# Patient Record
Sex: Female | Born: 1935
Health system: Southern US, Community
[De-identification: ages and names within clinical notes are randomized; demographics above are authoritative.]

## PROBLEM LIST (undated history)

## (undated) DIAGNOSIS — M25562 Pain in left knee: Secondary | ICD-10-CM

## (undated) DIAGNOSIS — R011 Cardiac murmur, unspecified: Secondary | ICD-10-CM

## (undated) DIAGNOSIS — R0989 Other specified symptoms and signs involving the circulatory and respiratory systems: Secondary | ICD-10-CM

## (undated) DIAGNOSIS — K851 Biliary acute pancreatitis without necrosis or infection: Secondary | ICD-10-CM

## (undated) DIAGNOSIS — D126 Benign neoplasm of colon, unspecified: Secondary | ICD-10-CM

## (undated) DIAGNOSIS — I739 Peripheral vascular disease, unspecified: Secondary | ICD-10-CM

## (undated) DIAGNOSIS — M1990 Unspecified osteoarthritis, unspecified site: Secondary | ICD-10-CM

## (undated) DIAGNOSIS — M6530 Trigger finger, unspecified finger: Secondary | ICD-10-CM

## (undated) DIAGNOSIS — E7800 Pure hypercholesterolemia, unspecified: Secondary | ICD-10-CM

## (undated) DIAGNOSIS — I10 Essential (primary) hypertension: Secondary | ICD-10-CM

## (undated) DIAGNOSIS — R42 Dizziness and giddiness: Secondary | ICD-10-CM

## (undated) DIAGNOSIS — I2510 Atherosclerotic heart disease of native coronary artery without angina pectoris: Secondary | ICD-10-CM

## (undated) DIAGNOSIS — G8929 Other chronic pain: Secondary | ICD-10-CM

## (undated) DIAGNOSIS — B351 Tinea unguium: Secondary | ICD-10-CM

## (undated) DIAGNOSIS — M8580 Other specified disorders of bone density and structure, unspecified site: Secondary | ICD-10-CM

## (undated) DIAGNOSIS — L97429 Non-pressure chronic ulcer of left heel and midfoot with unspecified severity: Secondary | ICD-10-CM

## (undated) DIAGNOSIS — Z87440 Personal history of urinary (tract) infections: Secondary | ICD-10-CM

## (undated) DIAGNOSIS — M549 Dorsalgia, unspecified: Secondary | ICD-10-CM

## (undated) HISTORY — DX: Trigger finger, unspecified finger: M65.30

## (undated) HISTORY — DX: Tinea unguium: B35.1

## (undated) HISTORY — DX: Other specified symptoms and signs involving the circulatory and respiratory systems: R09.89

## (undated) HISTORY — PX: TOTAL SHOULDER ARTHROPLASTY: SHX126

## (undated) HISTORY — DX: Pain in left knee: M25.562

## (undated) HISTORY — DX: Atherosclerotic heart disease of native coronary artery without angina pectoris: I25.10

## (undated) HISTORY — DX: Dorsalgia, unspecified: M54.9

## (undated) HISTORY — PX: EYE SURGERY: SHX253

## (undated) HISTORY — DX: Non-pressure chronic ulcer of left heel and midfoot with unspecified severity: L97.429

## (undated) HISTORY — DX: Other chronic pain: G89.29

## (undated) HISTORY — DX: Unspecified osteoarthritis, unspecified site: M19.90

## (undated) HISTORY — DX: Biliary acute pancreatitis without necrosis or infection: K85.10

## (undated) HISTORY — PX: ABDOMINAL HYSTERECTOMY: SHX81

## (undated) HISTORY — DX: Benign neoplasm of colon, unspecified: D12.6

## (undated) HISTORY — DX: Peripheral vascular disease, unspecified: I73.9

## (undated) HISTORY — PX: TOTAL HIP ARTHROPLASTY: SHX124

## (undated) HISTORY — DX: Other specified disorders of bone density and structure, unspecified site: M85.80

## (undated) HISTORY — PX: TOTAL KNEE ARTHROPLASTY: SHX125

## (undated) HISTORY — DX: Dizziness and giddiness: R42

---

## 1999-10-14 ENCOUNTER — Other Ambulatory Visit: Admission: RE | Admit: 1999-10-14 | Discharge: 1999-10-14 | Payer: Self-pay | Admitting: Obstetrics and Gynecology

## 2000-11-05 ENCOUNTER — Encounter: Admission: RE | Admit: 2000-11-05 | Discharge: 2000-11-05 | Payer: Self-pay | Admitting: Internal Medicine

## 2000-11-05 ENCOUNTER — Encounter: Payer: Self-pay | Admitting: Internal Medicine

## 2003-01-26 ENCOUNTER — Other Ambulatory Visit: Admission: RE | Admit: 2003-01-26 | Discharge: 2003-01-26 | Payer: Self-pay | Admitting: Obstetrics and Gynecology

## 2003-08-27 ENCOUNTER — Ambulatory Visit (HOSPITAL_COMMUNITY): Admission: RE | Admit: 2003-08-27 | Discharge: 2003-08-27 | Payer: Self-pay | Admitting: Cardiology

## 2005-05-18 DIAGNOSIS — D126 Benign neoplasm of colon, unspecified: Secondary | ICD-10-CM

## 2005-05-18 HISTORY — DX: Benign neoplasm of colon, unspecified: D12.6

## 2005-06-03 ENCOUNTER — Encounter (INDEPENDENT_AMBULATORY_CARE_PROVIDER_SITE_OTHER): Payer: Self-pay | Admitting: Specialist

## 2005-06-03 ENCOUNTER — Ambulatory Visit (HOSPITAL_COMMUNITY): Admission: RE | Admit: 2005-06-03 | Discharge: 2005-06-03 | Payer: Self-pay | Admitting: *Deleted

## 2005-07-08 ENCOUNTER — Encounter: Admission: RE | Admit: 2005-07-08 | Discharge: 2005-07-08 | Payer: Self-pay | Admitting: Specialist

## 2005-08-13 ENCOUNTER — Inpatient Hospital Stay (HOSPITAL_COMMUNITY): Admission: RE | Admit: 2005-08-13 | Discharge: 2005-08-18 | Payer: Self-pay | Admitting: Specialist

## 2005-09-07 ENCOUNTER — Encounter: Admission: RE | Admit: 2005-09-07 | Discharge: 2005-10-22 | Payer: Self-pay | Admitting: Specialist

## 2006-02-27 HISTORY — PX: JOINT REPLACEMENT: SHX530

## 2006-06-22 ENCOUNTER — Inpatient Hospital Stay (HOSPITAL_COMMUNITY): Admission: RE | Admit: 2006-06-22 | Discharge: 2006-06-27 | Payer: Self-pay | Admitting: Orthopedic Surgery

## 2006-06-22 HISTORY — PX: JOINT REPLACEMENT: SHX530

## 2006-06-22 IMAGING — CR DG PORTABLE PELVIS
1 series · 1 of 1 positions shown · non-contrast
Comparison: None.

CLINICAL DATA: Status post left hip replacement.
PORTABLE PELVIS ? 1 VIEW:

[view not recorded]
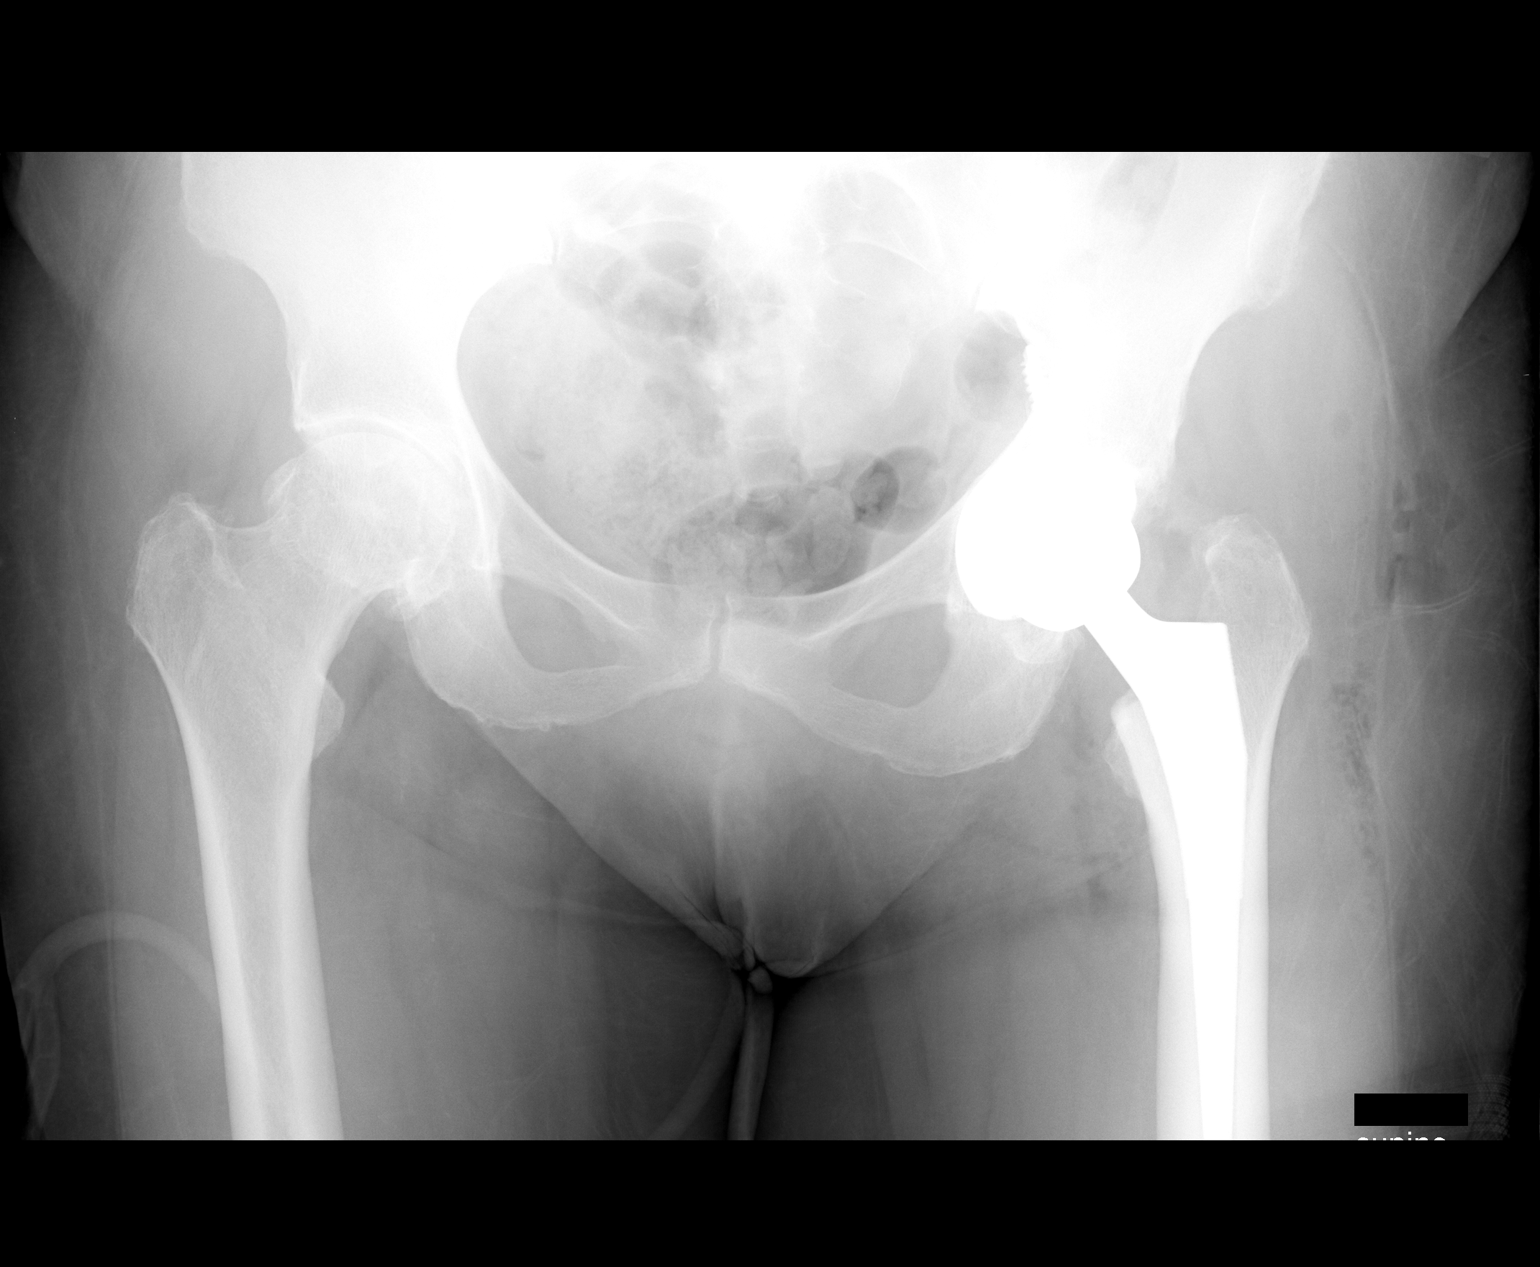

[1 of 1 positions shown; findings below may reference images not displayed]

FINDINGS: Status post left total hip replacement. There is a single screw through the ischium.  The tip of the screw appears to be extraosseous, medial to the pelvic rim. Otherwise satisfactory position and alignment.
IMPRESSION: 1.  The tip of the acetabular screw appears to be medial to the left pelvic rim.
2.  Otherwise good position and alignment.

## 2006-09-06 ENCOUNTER — Encounter: Admission: RE | Admit: 2006-09-06 | Discharge: 2006-09-06 | Payer: Self-pay | Admitting: Specialist

## 2006-09-28 ENCOUNTER — Encounter: Admission: RE | Admit: 2006-09-28 | Discharge: 2006-09-28 | Payer: Self-pay | Admitting: Specialist

## 2006-10-01 ENCOUNTER — Ambulatory Visit (HOSPITAL_COMMUNITY): Admission: RE | Admit: 2006-10-01 | Discharge: 2006-10-01 | Payer: Self-pay | Admitting: Sports Medicine

## 2006-10-01 ENCOUNTER — Ambulatory Visit: Payer: Self-pay | Admitting: *Deleted

## 2006-11-26 ENCOUNTER — Encounter: Admission: RE | Admit: 2006-11-26 | Discharge: 2006-11-26 | Payer: Self-pay | Admitting: Specialist

## 2006-11-26 HISTORY — PX: JOINT REPLACEMENT: SHX530

## 2007-03-21 ENCOUNTER — Encounter: Admission: RE | Admit: 2007-03-21 | Discharge: 2007-03-21 | Payer: Self-pay | Admitting: Orthopedic Surgery

## 2007-03-21 IMAGING — CT CT EXTREM UP W/O CM*R*
1 series · 1 of 2 positions shown · IV contrast (agent unspecified)
Comparison: None.

CLINICAL DATA: 76 year old with right shoulder pain.   Pre op evaluation. 
 CT RIGHT SHOULDER WITHOUT CONTRAST:
TECHNIQUE: Multidetector CT imaging was performed according to the standard protocol.  No intravenous contrast was administered.  Multiplanar CT image reconstructions were also generated.

[Series 1: scouts · coronal · 400.0mm · 0.57mm/px · 1 of 2 slices shown]
[im 2/2]
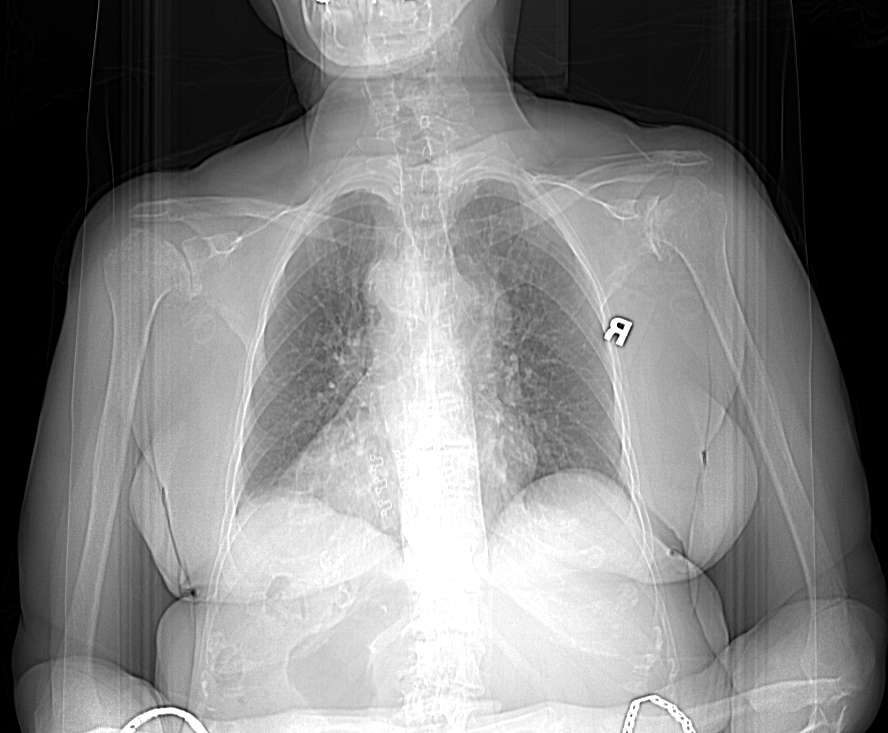

[1 of 2 positions shown; findings below may reference images not displayed]

FINDINGS: On the scout film, there is severe degenerative disease involving both shoulders. The right appears to be slightly worse.  The CT examination demonstrates severe degenerative joint disease with marked joint space narrowing, widening of the glenoid with osteophytic spurring, bony eburnation and subchondral cystic change more significantly involving the humeral head.   The findings may be secondary to advanced osteoarthritis.  Inflammatory arthropathy is also possible particularly gout would be a consideration given what appear to be erosions away from the joint.  There is a rounded ossified density in the axillary recess which could be a focus of synovial osteochondromatosis or a loose body associated with a osteophyte.  The AC joint is intact with moderate degenerative changes.  The acromion is type II in shape.  There is a moderate sized joint effusion.  The rotator cuff tendons appear to be grossly intact.
IMPRESSION: 1.  Severe degenerative disease involving the right shoulder.  It may be advanced osteoarthritis, but inflammatory arthropathy is also possible.  There is a moderate sized joint effusion and a large loose ossified body in the axillary recess.  
 2.  Moderate AC joint degenerative changes.

## 2007-03-21 IMAGING — CT CT EXTREM UP W/O CM*R*
2 of 4 series · 6 of 14 positions shown, 7 images · IV contrast (agent unspecified)
Comparison: None.

CLINICAL DATA: 76 year old with right shoulder pain.   Pre op evaluation. 
 CT RIGHT SHOULDER WITHOUT CONTRAST:
TECHNIQUE: Multidetector CT imaging was performed according to the standard protocol.  No intravenous contrast was administered.  Multiplanar CT image reconstructions were also generated.

[Series 3: shoulder detail · axial · 0.36mm/px · z∈[+4,+54]mm · 3 of 81 slices shown, 4 images]
[im 21/81  soft-tissue]
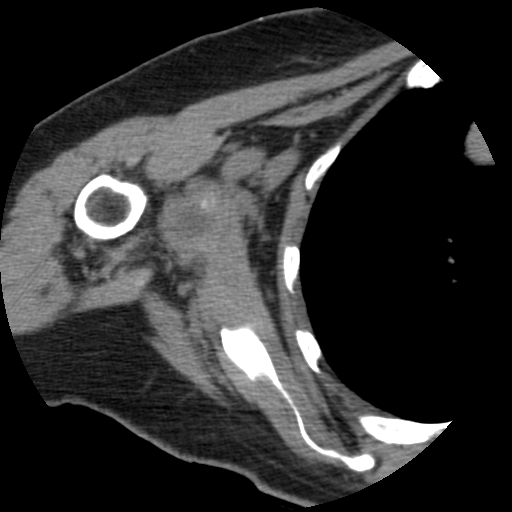
[im 21/81  bone]
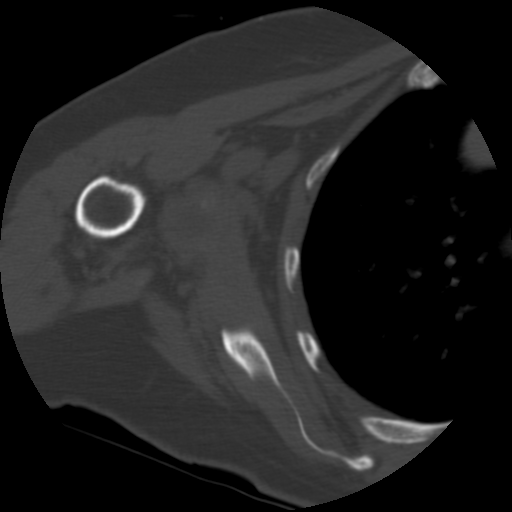
[im 41/81  bone]
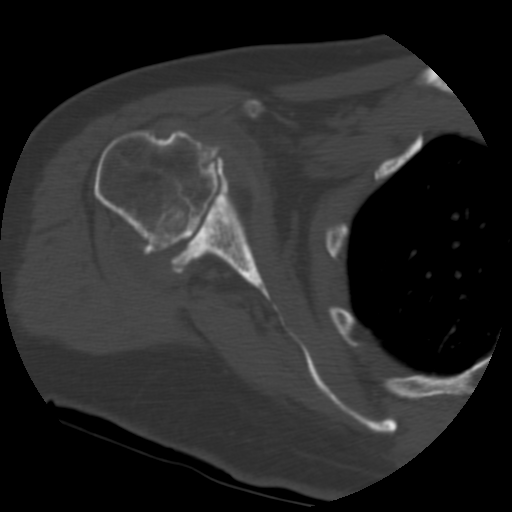
[im 61/81  bone]
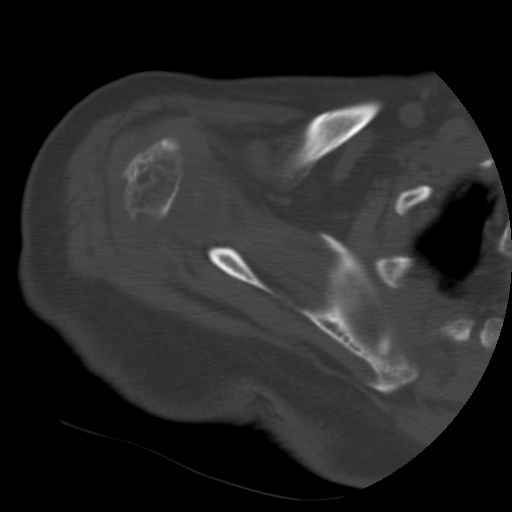

[Series 4: shoulder bone · axial · 0.36mm/px · z∈[+4,+54]mm · 3 of 81 slices shown]
[im 21/81  bone]
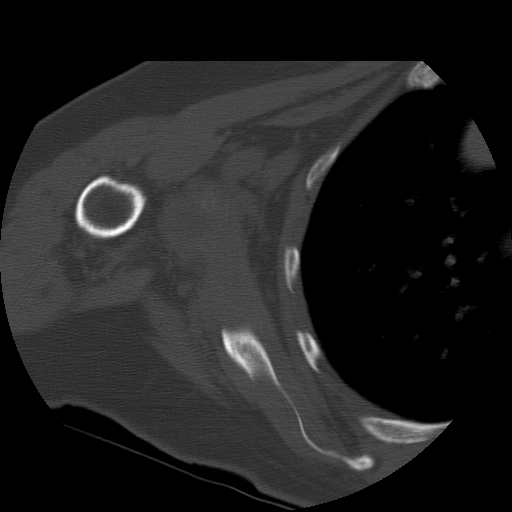
[im 41/81  bone]
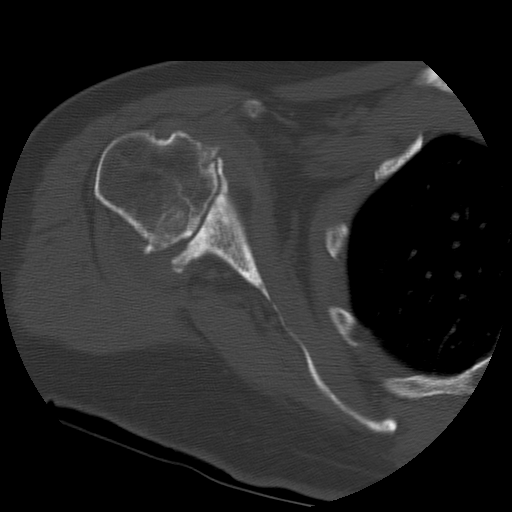
[im 61/81  bone]
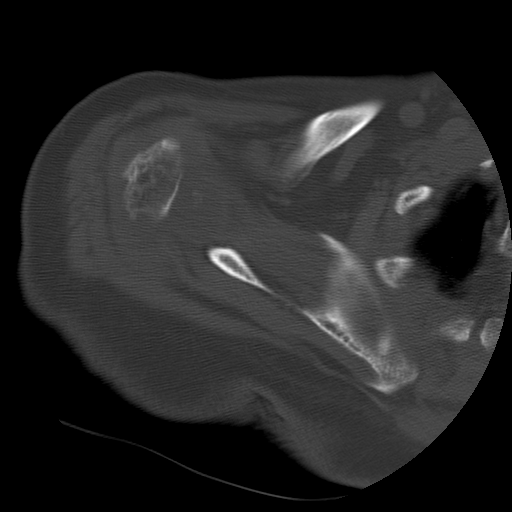

[6 of 14 positions shown; findings below may reference images not displayed]

FINDINGS: On the scout film, there is severe degenerative disease involving both shoulders. The right appears to be slightly worse.  The CT examination demonstrates severe degenerative joint disease with marked joint space narrowing, widening of the glenoid with osteophytic spurring, bony eburnation and subchondral cystic change more significantly involving the humeral head.   The findings may be secondary to advanced osteoarthritis.  Inflammatory arthropathy is also possible particularly gout would be a consideration given what appear to be erosions away from the joint.  There is a rounded ossified density in the axillary recess which could be a focus of synovial osteochondromatosis or a loose body associated with a osteophyte.  The AC joint is intact with moderate degenerative changes.  The acromion is type II in shape.  There is a moderate sized joint effusion.  The rotator cuff tendons appear to be grossly intact.
IMPRESSION: 1.  Severe degenerative disease involving the right shoulder.  It may be advanced osteoarthritis, but inflammatory arthropathy is also possible.  There is a moderate sized joint effusion and a large loose ossified body in the axillary recess.  
 2.  Moderate AC joint degenerative changes.

## 2007-05-31 ENCOUNTER — Encounter: Admission: RE | Admit: 2007-05-31 | Discharge: 2007-05-31 | Payer: Self-pay | Admitting: Orthopedic Surgery

## 2007-06-21 ENCOUNTER — Encounter: Admission: RE | Admit: 2007-06-21 | Discharge: 2007-06-21 | Payer: Self-pay | Admitting: Orthopedic Surgery

## 2007-06-21 IMAGING — CR DG MYELOGRAM LUMBAR
4 series · 4 of 4 positions shown · non-contrast
Comparison: None available.

CLINICAL DATA: Back pain.  
LUMBAR MYELOGRAM:
TECHNIQUE: Multidetector CT imaging of the lumbar spine was performed after intrathecal injection of contrast.  Multiplanar CT image reconstructions were also generated.  Levels from T12-L1 to L5-S1.

[view not recorded (1 of 4)]
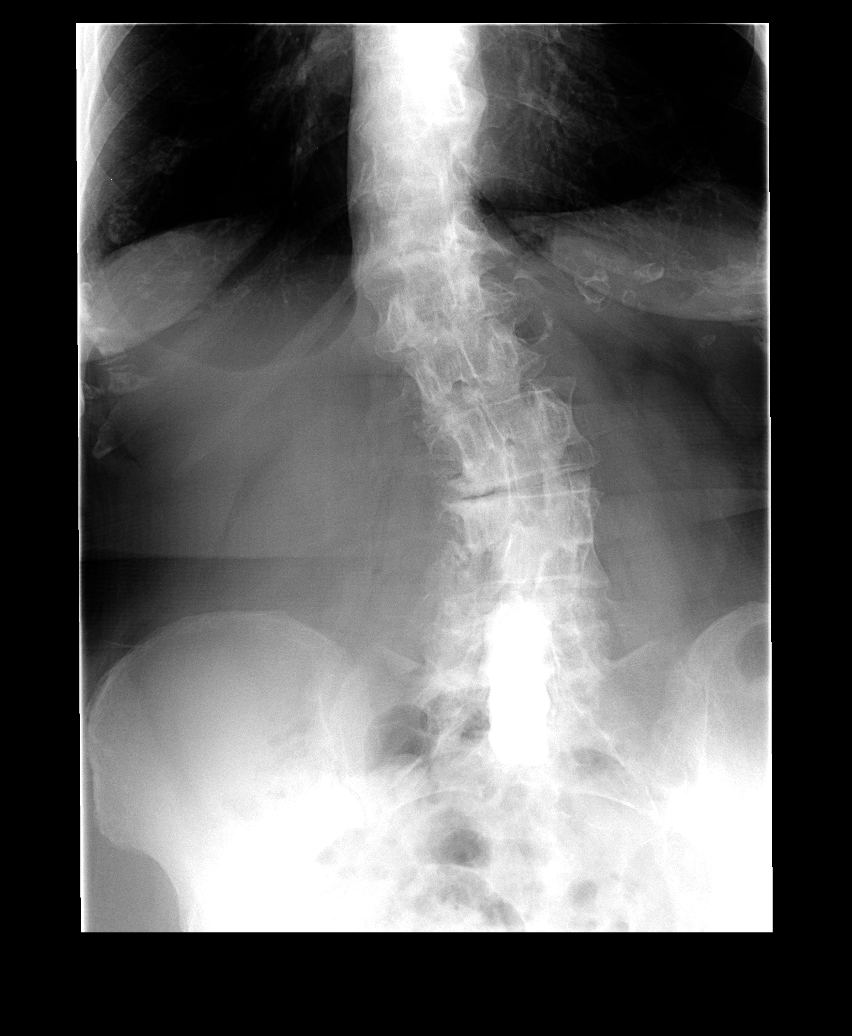

[view not recorded (2 of 4)]
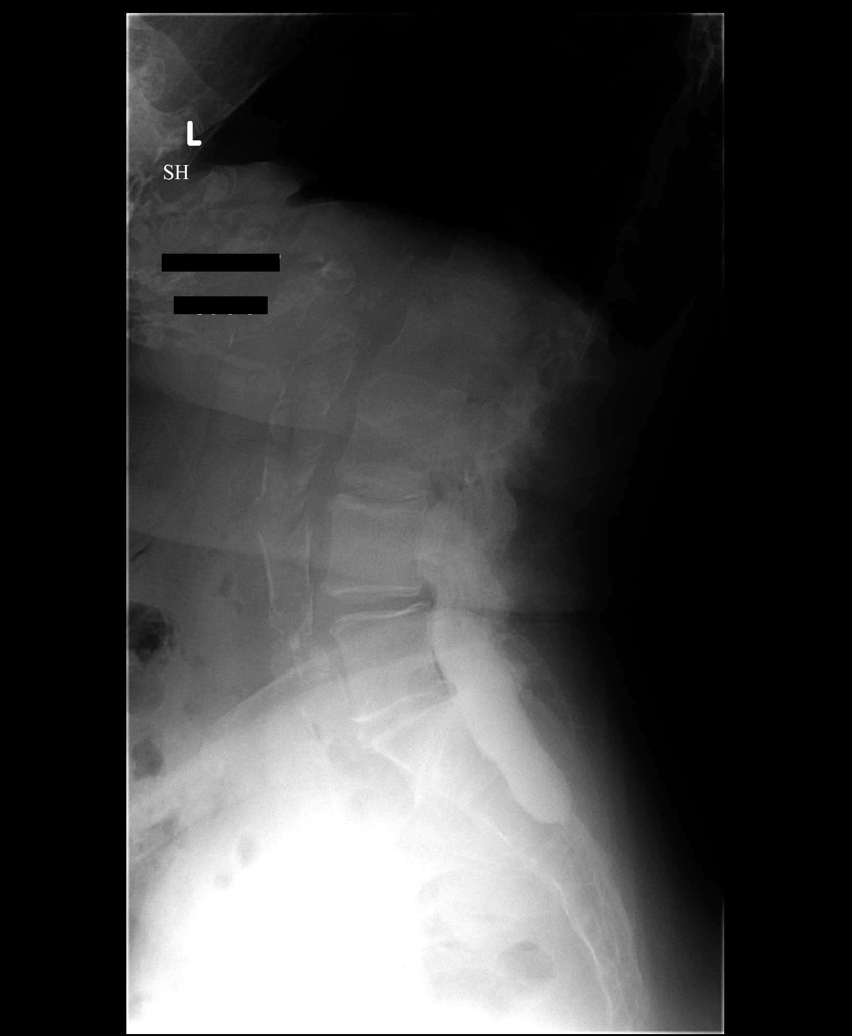

[view not recorded (3 of 4)]
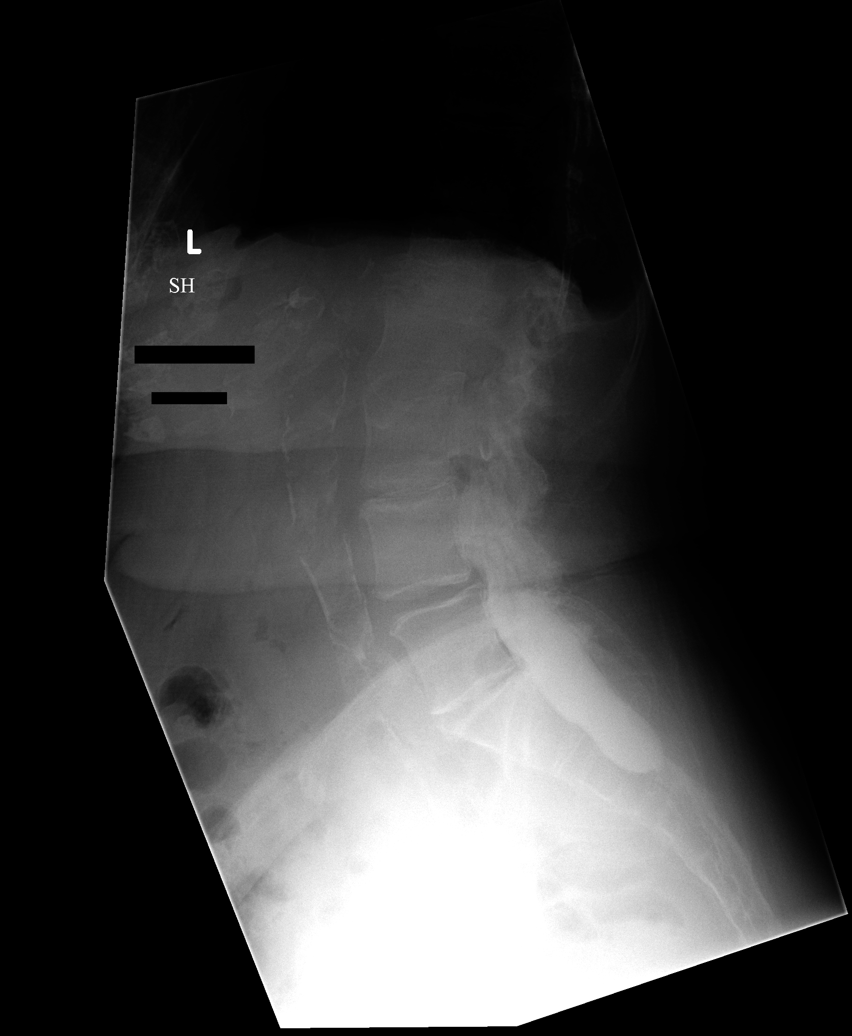

[view not recorded (4 of 4)]
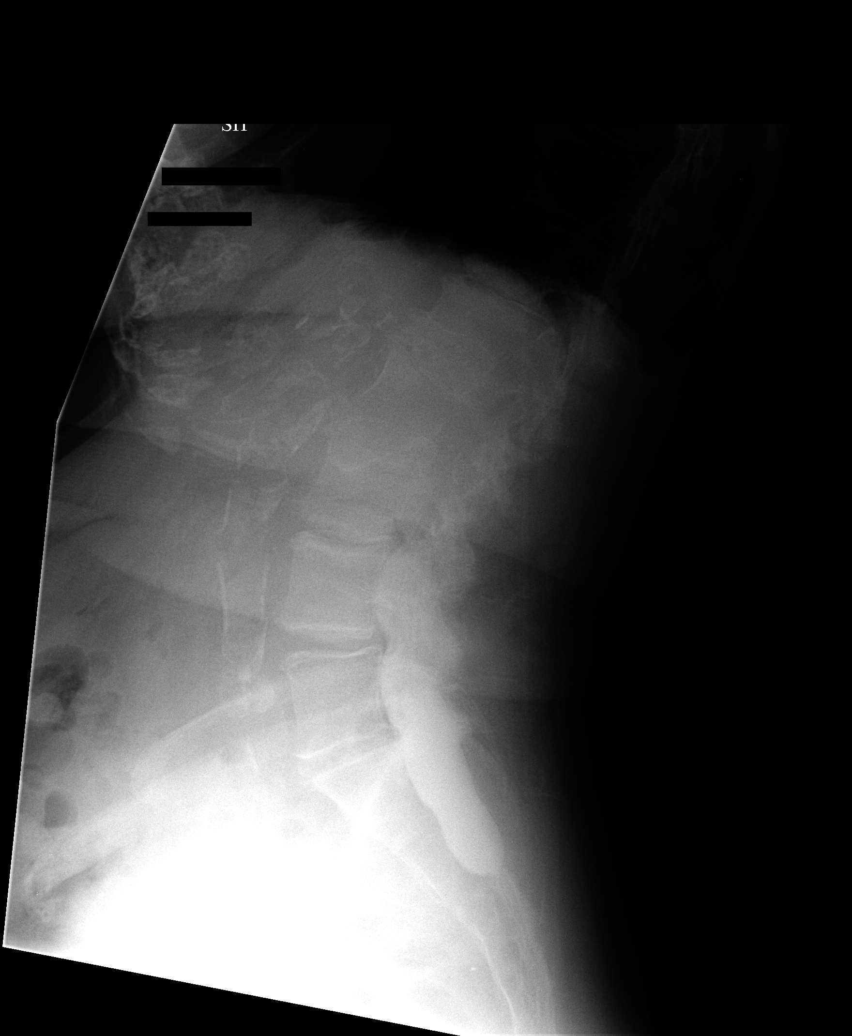

[4 of 4 positions shown; findings below may reference images not displayed]

Following informed consent, sterile preparation of the back, and adequate local anesthesia, a lumbar puncture was performed using a 22 gauge needle at L3-4 from a left paramedian approach.  Fluid was clear and colorless.  15cc of Omnipaque 180 was instilled in the subarachnoid space.
FINDINGS: Marked levoconvex lumbar scoliosis is present, with associated degenerative change.  There are 2mm of rightward slip of L1 on L2, and L2 on L3.  2mm of anterolisthesis of L4 on L5.  This tiny grade I anterolisthesis of L4 on L5 does not change with flexion and extension maneuvers.  Abdominal aortic atherosclerosis.  Anterior extradural impressions are found at L2-3, L3-4, L4-5, and to a lesser extent at L5-S1 which will be discussed below in CT findings.  There is disc space loss most pronounced at L5-S1 and L3-4.
IMPRESSION: 1.  Technically successful lumbar puncture for lumbar myelogram. 
2.  Levoconvex lumbar scoliosis, with associated degenerative change. 
3.  Lateral slip to the right of L1 on L2 and L2 on L3 with fixed grade I anterolisthesis of L4 on L5.  
CT LUMBAR SPINE W/CONTRAST (POST-MYELOGRAM):
FINDINGS: T12-L1:  Large anterolateral left disc osteophyte complex.  Moderate bilateral facet arthrosis, with mild ligamentum flavum redundancy.  No central canal stenosis.  Right neural foramen is patent however the left neural foramen is moderately stenotic due to endplate osteophyte, disc bulge, and facet arthrosis.  Lateral recess is normal.  
L1-2:  Severe vacuum disc and marked degenerative endplate changes, with cyst formation at both endplates.  Large anterior left disc osteophyte complex is present.  Mild to moderate bilateral facet arthrosis, with moderate ligamentum flavum redundancy.  Left paracentral and posterolateral disc and osteophytes narrow the left lateral recess.  Severe left L1-2 foraminal stenosis due to disc, endplate osteophyte, and facet disease.   Moderate right foraminal stenosis is present due to scoliosis, loss of disc height, and tiny disc bulge.  Right lateral recess appears patent.  
L2-3:  Vacuum disc, with severe disc degeneration.  Severe facet arthrosis bilaterally, with ligamentum flavum redundancy and a mild central canal stenosis.  Neural foramen and lateral recess patent, but the lateral aspect of disc bulge contacts the exiting left L2 nerve root in the extraforaminal fat.  There is a severe right sided L2-3 foraminal stenosis secondary to disc space loss, bulge, facet spurring.  Subarticular lateral recess stenosis is also present due to severe right sided facet spur. 
L3-4:  Degenerated disc, with right sided vacuum disc.  Moderate to severe bilateral facet arthrosis, with spurring.  Mild bilateral symmetric subarticular lateral recess stenosis.  Ligamentum flavum redundancy and posterior disc bulge produce a mild central canal stenosis.  Severe right foraminal narrowing with very mild left foraminal narrowing due on the right to endplate osteophyte, facet spur, and disc.  
L4-5:  Vacuum disc on the right.  Grade I anterolisthesis.  Severe bilateral facet arthrosis, right slightly greater than left.  Mild subarticular lateral recess stenosis bilaterally but no definite root compression.  Moderate right neural foraminal stenosis is present due to facet spurring, slip, and uncoverage of the right side of the disc.  No central stenosis.  
L5-S1:  Moderately severe bilateral facet arthrosis.  No central or lateral recess stenosis.  Although there is loss of disc height at this level, little if any posterior bulge is present.  Left neural foramen is patent, but a lateral osteophyte contacts the exiting left L5 nerve root.  On the right side, there is similarly no foraminal stenosis, but the right L5 nerve root is deflected posterolaterally by a large right lateral osteophyte.  Partial ankylosis of the right L5-S1 disc space secondary to flowing marginal osteophytes.  Bilateral posterior inferior L5 endplate spurring is present but this does not appear to significantly narrow the foramina bilaterally.
IMPRESSION: 1.  Marked levoconvex lumbar scoliosis, with multilevel degenerative disc disease.  
2.  Mild central canal stenosis at L2-3 and L3-4. 
3.  Right sided foraminal stenosis L2-3, L3-4, and L4-5. 
4.  Left foraminal stenosis at T12-L1 and L1-2.  
5.  Mild lateral recess narrowing which is most pronounced at L2-3 on the right and L1-2 on the left.

## 2007-06-21 IMAGING — CT CT L SPINE W/ CM
4 of 10 series · 12 of 33 positions shown, 14 images · non-contrast
Comparison: None available.

CLINICAL DATA: Back pain.  
LUMBAR MYELOGRAM:
TECHNIQUE: Multidetector CT imaging of the lumbar spine was performed after intrathecal injection of contrast.  Multiplanar CT image reconstructions were also generated.  Levels from T12-L1 to L5-S1.

[Series 2: l-spine helical · axial · 0.27mm/px · z∈[+10,+78]mm · 2 of 82 slices shown, 3 images]
[im 28/82  soft-tissue]
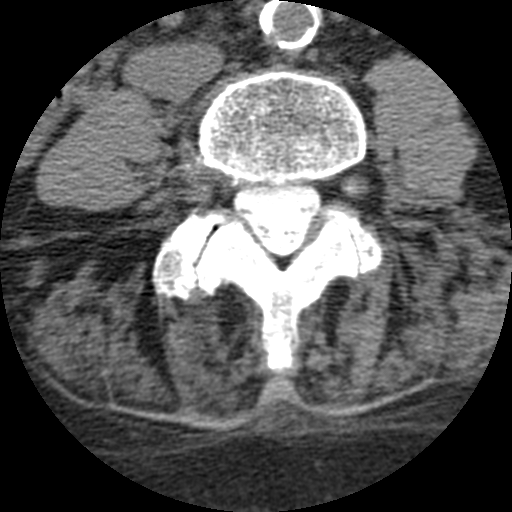
[im 28/82  bone]
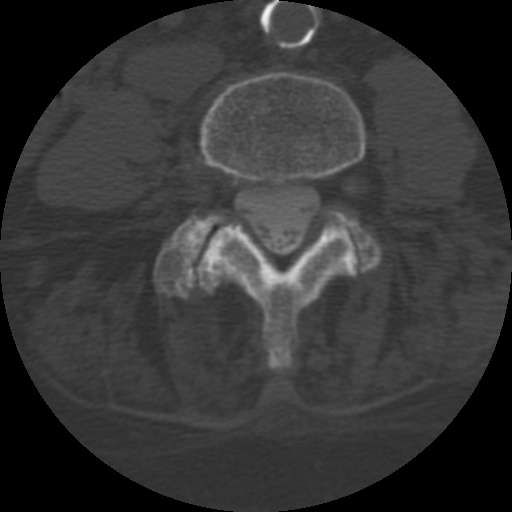
[im 55/82  bone]
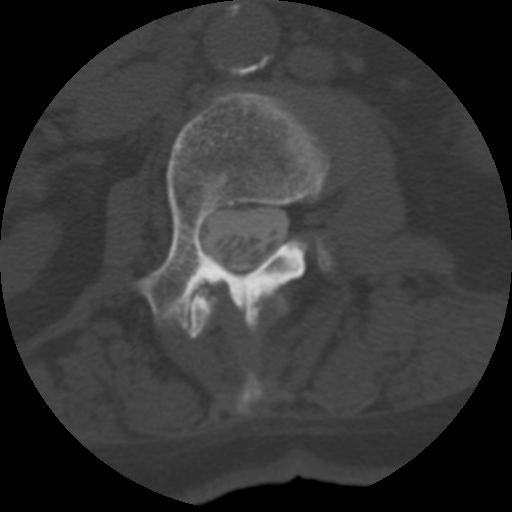

[Series 3: bone windows · axial · 0.27mm/px · z∈[+10,+78]mm · 2 of 82 slices shown]
[im 28/82  bone]
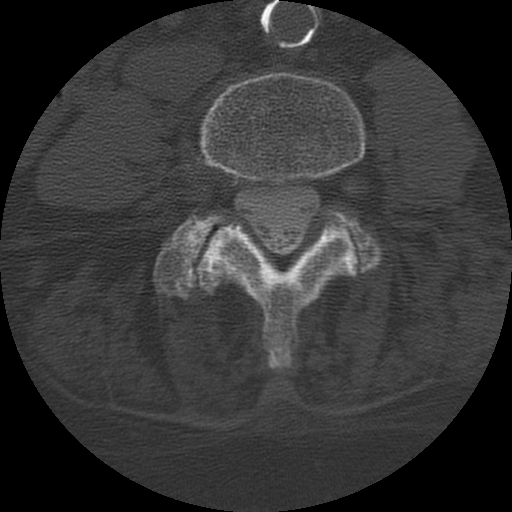
[im 55/82  bone]
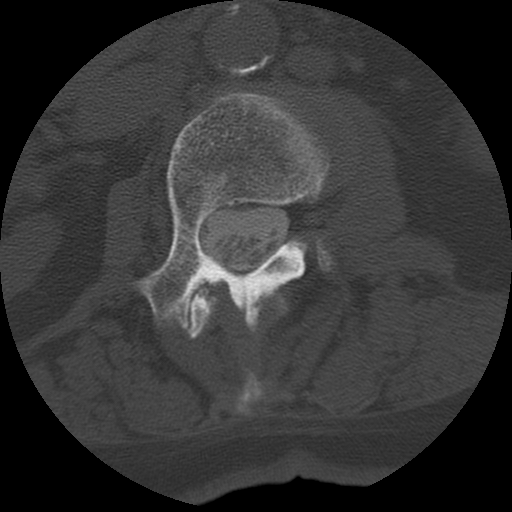

[Series 400: sagittal · sagittal · 0.41mm/px · 5 of 40 slices shown, 6 images]
[im 14/40  bone]
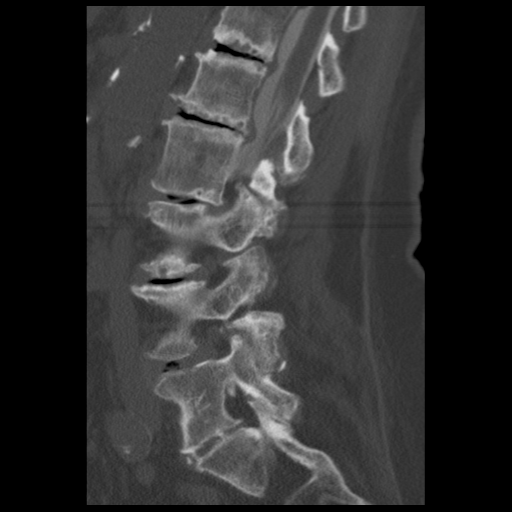
[im 17/40  bone]
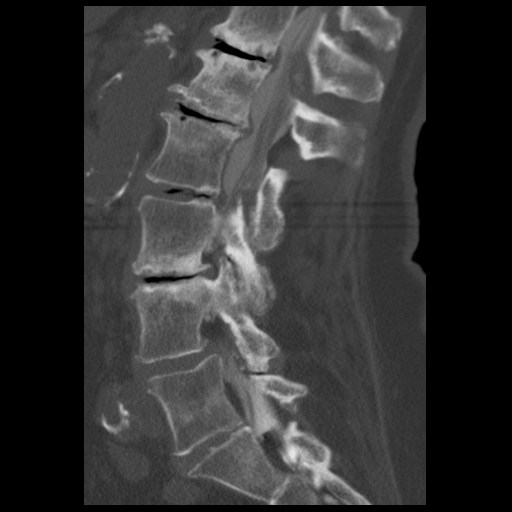
[im 20/40  soft-tissue]
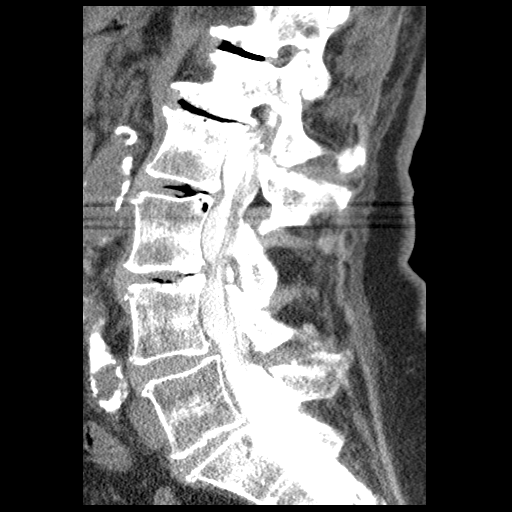
[im 20/40  bone]
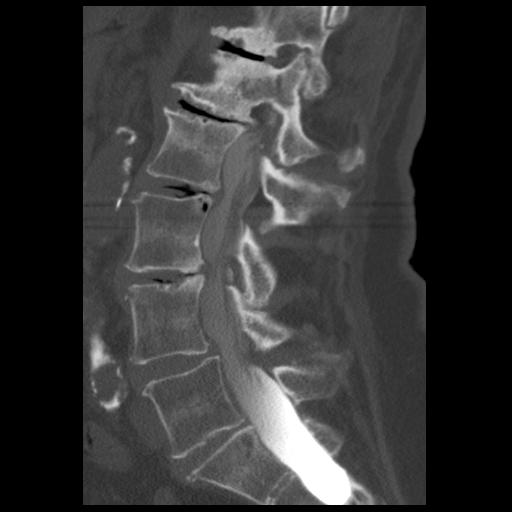
[im 23/40  bone]
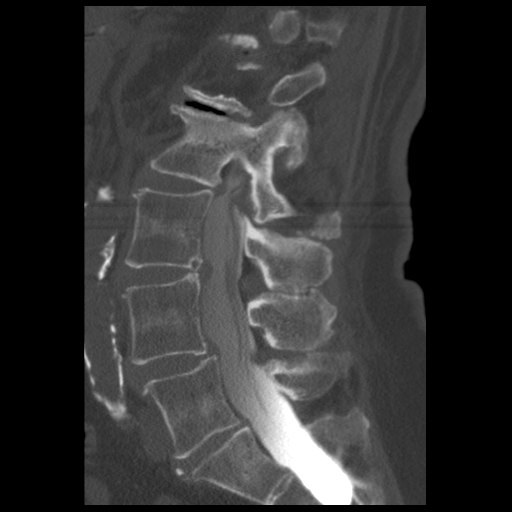
[im 27/40  bone]
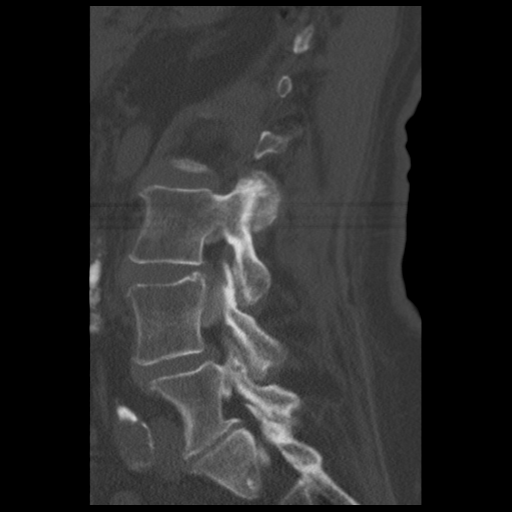

[Series 401: coronal · coronal · 0.41mm/px · 3 of 40 slices shown]
[im 8/40  bone]
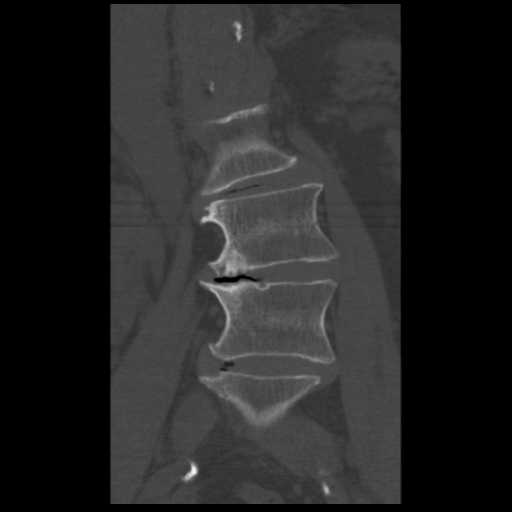
[im 16/40  bone]
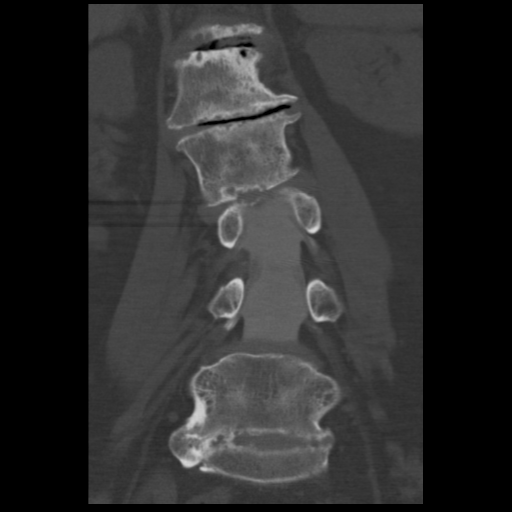
[im 24/40  bone]
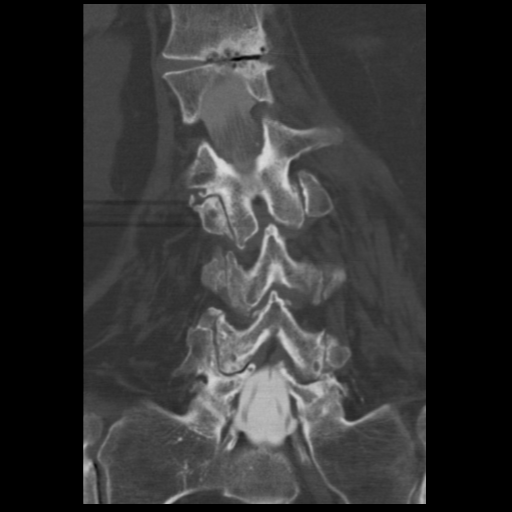

[12 of 33 positions shown; findings below may reference images not displayed]

Following informed consent, sterile preparation of the back, and adequate local anesthesia, a lumbar puncture was performed using a 22 gauge needle at L3-4 from a left paramedian approach.  Fluid was clear and colorless.  15cc of Omnipaque 180 was instilled in the subarachnoid space.
FINDINGS: Marked levoconvex lumbar scoliosis is present, with associated degenerative change.  There are 2mm of rightward slip of L1 on L2, and L2 on L3.  2mm of anterolisthesis of L4 on L5.  This tiny grade I anterolisthesis of L4 on L5 does not change with flexion and extension maneuvers.  Abdominal aortic atherosclerosis.  Anterior extradural impressions are found at L2-3, L3-4, L4-5, and to a lesser extent at L5-S1 which will be discussed below in CT findings.  There is disc space loss most pronounced at L5-S1 and L3-4.
IMPRESSION: 1.  Technically successful lumbar puncture for lumbar myelogram. 
2.  Levoconvex lumbar scoliosis, with associated degenerative change. 
3.  Lateral slip to the right of L1 on L2 and L2 on L3 with fixed grade I anterolisthesis of L4 on L5.  
CT LUMBAR SPINE W/CONTRAST (POST-MYELOGRAM):
FINDINGS: T12-L1:  Large anterolateral left disc osteophyte complex.  Moderate bilateral facet arthrosis, with mild ligamentum flavum redundancy.  No central canal stenosis.  Right neural foramen is patent however the left neural foramen is moderately stenotic due to endplate osteophyte, disc bulge, and facet arthrosis.  Lateral recess is normal.  
L1-2:  Severe vacuum disc and marked degenerative endplate changes, with cyst formation at both endplates.  Large anterior left disc osteophyte complex is present.  Mild to moderate bilateral facet arthrosis, with moderate ligamentum flavum redundancy.  Left paracentral and posterolateral disc and osteophytes narrow the left lateral recess.  Severe left L1-2 foraminal stenosis due to disc, endplate osteophyte, and facet disease.   Moderate right foraminal stenosis is present due to scoliosis, loss of disc height, and tiny disc bulge.  Right lateral recess appears patent.  
L2-3:  Vacuum disc, with severe disc degeneration.  Severe facet arthrosis bilaterally, with ligamentum flavum redundancy and a mild central canal stenosis.  Neural foramen and lateral recess patent, but the lateral aspect of disc bulge contacts the exiting left L2 nerve root in the extraforaminal fat.  There is a severe right sided L2-3 foraminal stenosis secondary to disc space loss, bulge, facet spurring.  Subarticular lateral recess stenosis is also present due to severe right sided facet spur. 
L3-4:  Degenerated disc, with right sided vacuum disc.  Moderate to severe bilateral facet arthrosis, with spurring.  Mild bilateral symmetric subarticular lateral recess stenosis.  Ligamentum flavum redundancy and posterior disc bulge produce a mild central canal stenosis.  Severe right foraminal narrowing with very mild left foraminal narrowing due on the right to endplate osteophyte, facet spur, and disc.  
L4-5:  Vacuum disc on the right.  Grade I anterolisthesis.  Severe bilateral facet arthrosis, right slightly greater than left.  Mild subarticular lateral recess stenosis bilaterally but no definite root compression.  Moderate right neural foraminal stenosis is present due to facet spurring, slip, and uncoverage of the right side of the disc.  No central stenosis.  
L5-S1:  Moderately severe bilateral facet arthrosis.  No central or lateral recess stenosis.  Although there is loss of disc height at this level, little if any posterior bulge is present.  Left neural foramen is patent, but a lateral osteophyte contacts the exiting left L5 nerve root.  On the right side, there is similarly no foraminal stenosis, but the right L5 nerve root is deflected posterolaterally by a large right lateral osteophyte.  Partial ankylosis of the right L5-S1 disc space secondary to flowing marginal osteophytes.  Bilateral posterior inferior L5 endplate spurring is present but this does not appear to significantly narrow the foramina bilaterally.
IMPRESSION: 1.  Marked levoconvex lumbar scoliosis, with multilevel degenerative disc disease.  
2.  Mild central canal stenosis at L2-3 and L3-4. 
3.  Right sided foraminal stenosis L2-3, L3-4, and L4-5. 
4.  Left foraminal stenosis at T12-L1 and L1-2.  
5.  Mild lateral recess narrowing which is most pronounced at L2-3 on the right and L1-2 on the left.

## 2007-06-21 IMAGING — RF DG MYELOGRAM LUMBAR
12 of 19 series · 12 of 19 positions shown · non-contrast
Comparison: None available.

CLINICAL DATA: Back pain.  
LUMBAR MYELOGRAM:
TECHNIQUE: Multidetector CT imaging of the lumbar spine was performed after intrathecal injection of contrast.  Multiplanar CT image reconstructions were also generated.  Levels from T12-L1 to L5-S1.

[Series 3: (hospital) · 1 of 1 slices shown]
[im 1/1]
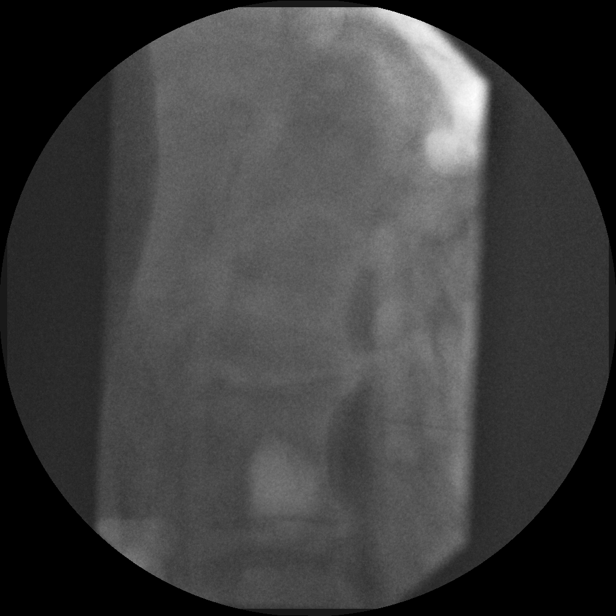

[Series 5: myelogram  white · 1 of 1 slices shown (1 of 11)]
[im 1/1]
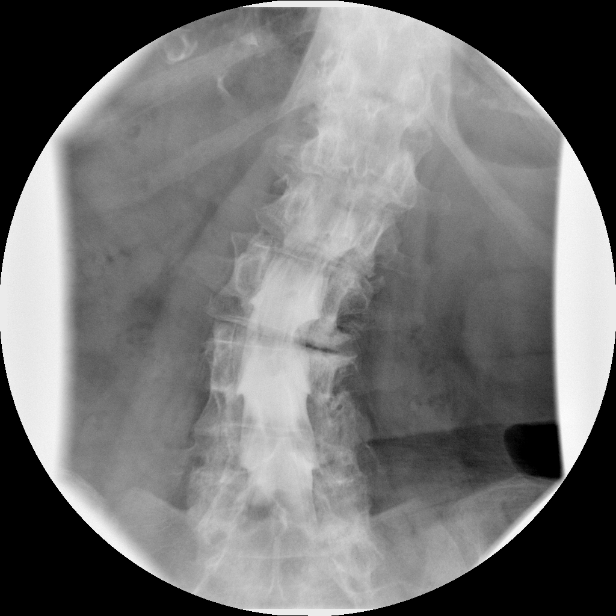

[Series 6: myelogram  white · 1 of 1 slices shown (2 of 11)]
[im 1/1]
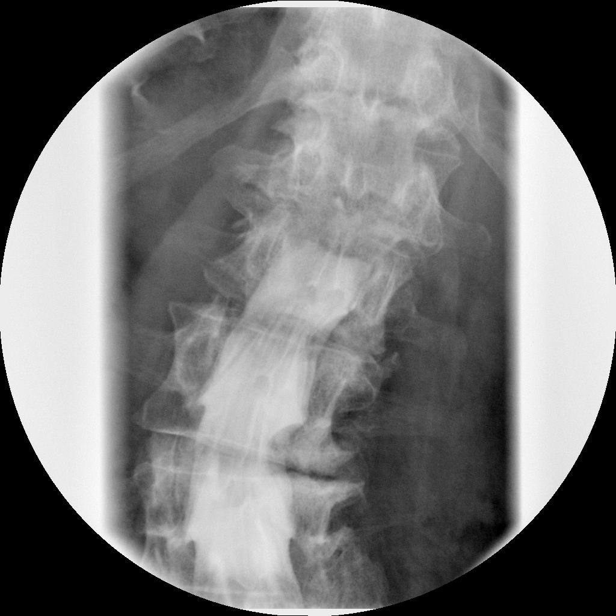

[Series 8: myelogram  white · 1 of 1 slices shown (3 of 11)]
[im 1/1]
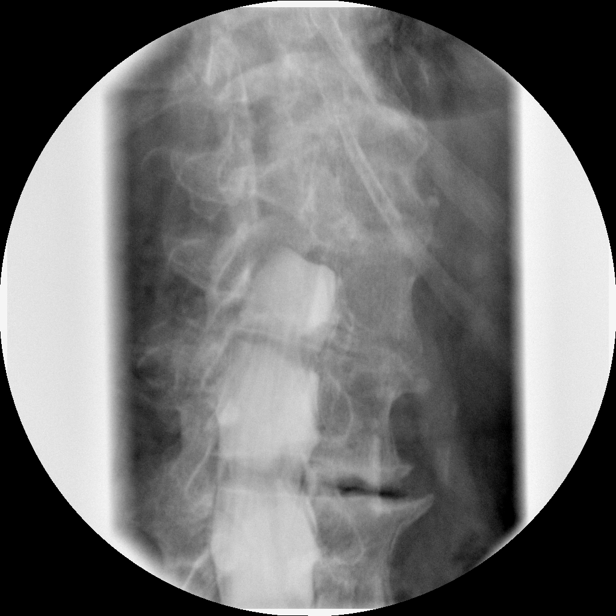

[Series 10: myelogram  white · 1 of 1 slices shown (4 of 11)]
[im 1/1]
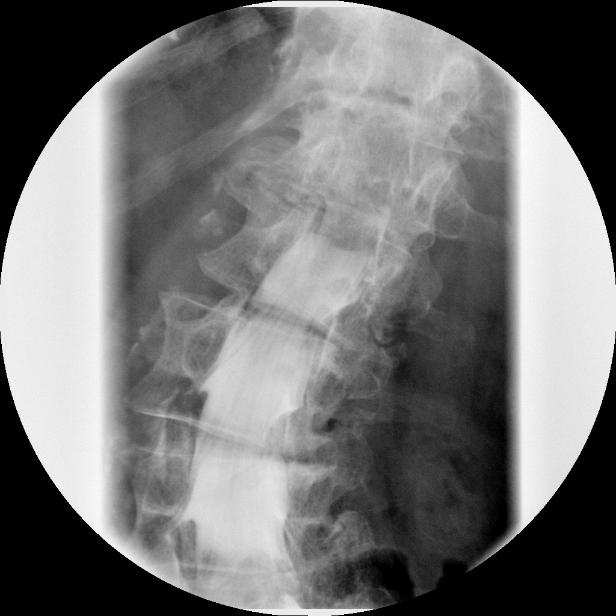

[Series 11: myelogram  white · 1 of 1 slices shown (5 of 11)]
[im 1/1]
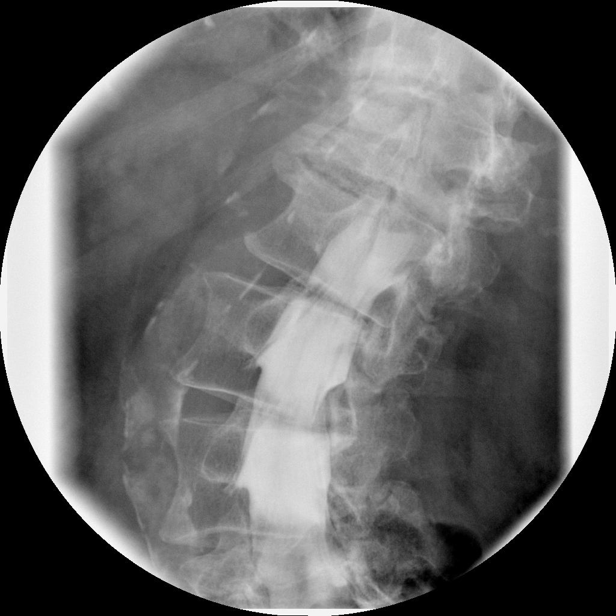

[Series 13: myelogram  white · 1 of 1 slices shown (6 of 11)]
[im 1/1]
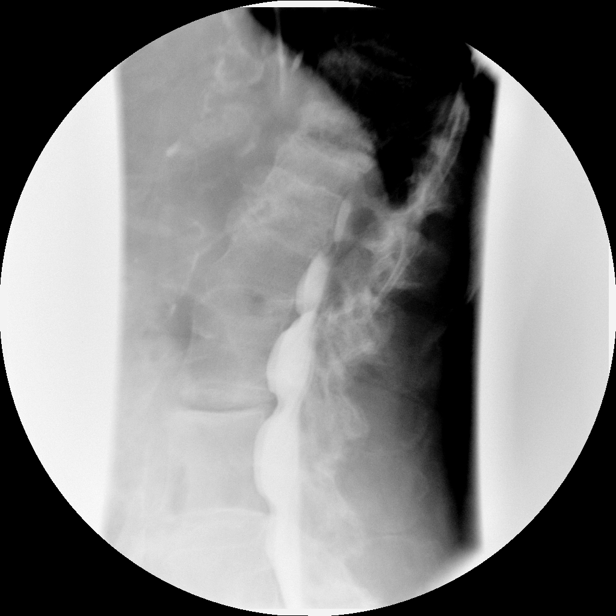

[Series 14: myelogram  white · 1 of 1 slices shown (7 of 11)]
[im 1/1]
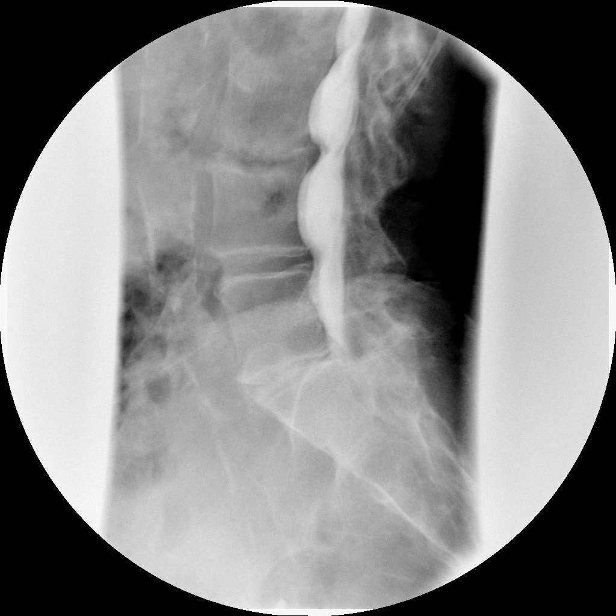

[Series 16: myelogram  white · 1 of 1 slices shown (8 of 11)]
[im 1/1]
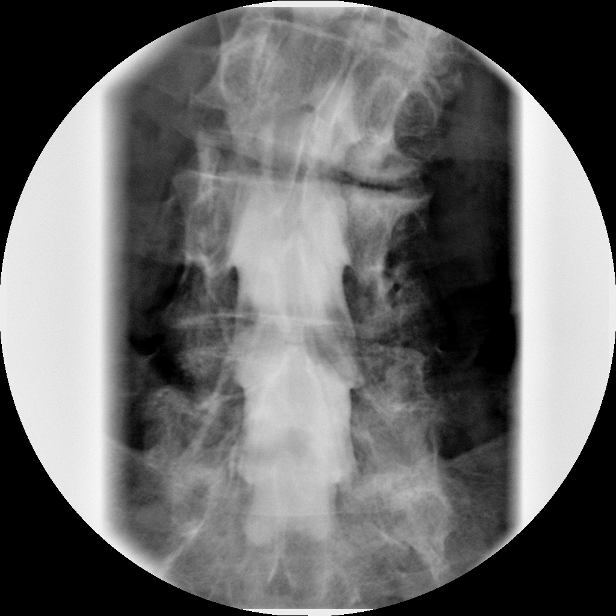

[Series 18: myelogram  white · 1 of 1 slices shown (9 of 11)]
[im 1/1]
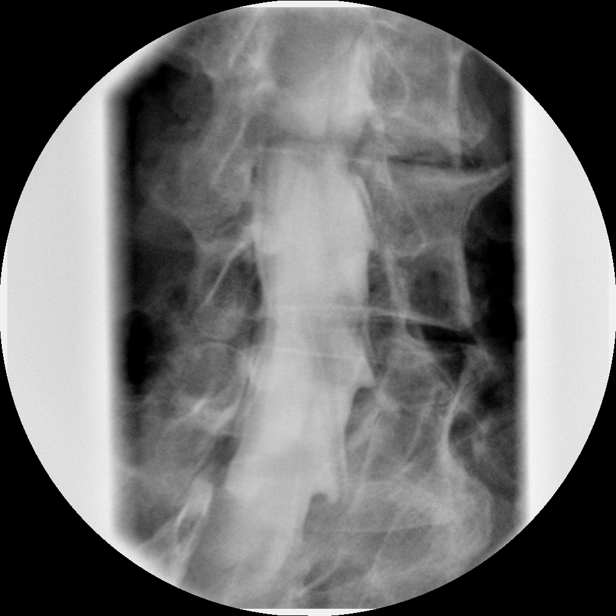

[Series 19: myelogram  white · 1 of 1 slices shown (10 of 11)]
[im 1/1]
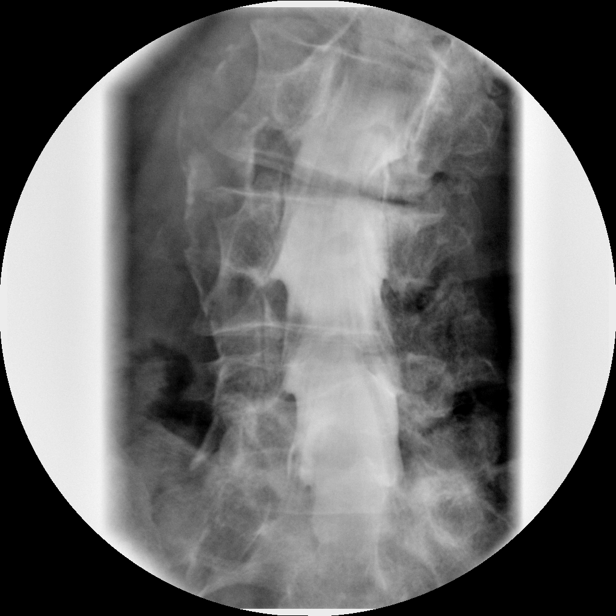

[Series 21: myelogram  white · 1 of 1 slices shown (11 of 11)]
[im 1/1]
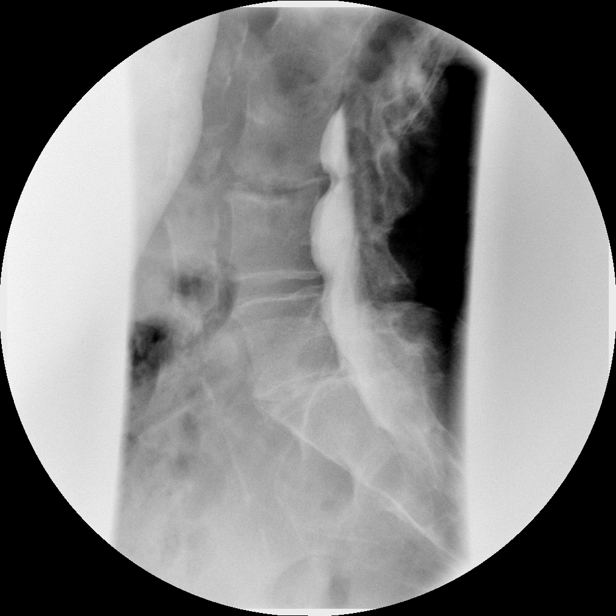

[12 of 19 positions shown; findings below may reference images not displayed]

Following informed consent, sterile preparation of the back, and adequate local anesthesia, a lumbar puncture was performed using a 22 gauge needle at L3-4 from a left paramedian approach.  Fluid was clear and colorless.  15cc of Omnipaque 180 was instilled in the subarachnoid space.
FINDINGS: Marked levoconvex lumbar scoliosis is present, with associated degenerative change.  There are 2mm of rightward slip of L1 on L2, and L2 on L3.  2mm of anterolisthesis of L4 on L5.  This tiny grade I anterolisthesis of L4 on L5 does not change with flexion and extension maneuvers.  Abdominal aortic atherosclerosis.  Anterior extradural impressions are found at L2-3, L3-4, L4-5, and to a lesser extent at L5-S1 which will be discussed below in CT findings.  There is disc space loss most pronounced at L5-S1 and L3-4.
IMPRESSION: 1.  Technically successful lumbar puncture for lumbar myelogram. 
2.  Levoconvex lumbar scoliosis, with associated degenerative change. 
3.  Lateral slip to the right of L1 on L2 and L2 on L3 with fixed grade I anterolisthesis of L4 on L5.  
CT LUMBAR SPINE W/CONTRAST (POST-MYELOGRAM):
FINDINGS: T12-L1:  Large anterolateral left disc osteophyte complex.  Moderate bilateral facet arthrosis, with mild ligamentum flavum redundancy.  No central canal stenosis.  Right neural foramen is patent however the left neural foramen is moderately stenotic due to endplate osteophyte, disc bulge, and facet arthrosis.  Lateral recess is normal.  
L1-2:  Severe vacuum disc and marked degenerative endplate changes, with cyst formation at both endplates.  Large anterior left disc osteophyte complex is present.  Mild to moderate bilateral facet arthrosis, with moderate ligamentum flavum redundancy.  Left paracentral and posterolateral disc and osteophytes narrow the left lateral recess.  Severe left L1-2 foraminal stenosis due to disc, endplate osteophyte, and facet disease.   Moderate right foraminal stenosis is present due to scoliosis, loss of disc height, and tiny disc bulge.  Right lateral recess appears patent.  
L2-3:  Vacuum disc, with severe disc degeneration.  Severe facet arthrosis bilaterally, with ligamentum flavum redundancy and a mild central canal stenosis.  Neural foramen and lateral recess patent, but the lateral aspect of disc bulge contacts the exiting left L2 nerve root in the extraforaminal fat.  There is a severe right sided L2-3 foraminal stenosis secondary to disc space loss, bulge, facet spurring.  Subarticular lateral recess stenosis is also present due to severe right sided facet spur. 
L3-4:  Degenerated disc, with right sided vacuum disc.  Moderate to severe bilateral facet arthrosis, with spurring.  Mild bilateral symmetric subarticular lateral recess stenosis.  Ligamentum flavum redundancy and posterior disc bulge produce a mild central canal stenosis.  Severe right foraminal narrowing with very mild left foraminal narrowing due on the right to endplate osteophyte, facet spur, and disc.  
L4-5:  Vacuum disc on the right.  Grade I anterolisthesis.  Severe bilateral facet arthrosis, right slightly greater than left.  Mild subarticular lateral recess stenosis bilaterally but no definite root compression.  Moderate right neural foraminal stenosis is present due to facet spurring, slip, and uncoverage of the right side of the disc.  No central stenosis.  
L5-S1:  Moderately severe bilateral facet arthrosis.  No central or lateral recess stenosis.  Although there is loss of disc height at this level, little if any posterior bulge is present.  Left neural foramen is patent, but a lateral osteophyte contacts the exiting left L5 nerve root.  On the right side, there is similarly no foraminal stenosis, but the right L5 nerve root is deflected posterolaterally by a large right lateral osteophyte.  Partial ankylosis of the right L5-S1 disc space secondary to flowing marginal osteophytes.  Bilateral posterior inferior L5 endplate spurring is present but this does not appear to significantly narrow the foramina bilaterally.
IMPRESSION: 1.  Marked levoconvex lumbar scoliosis, with multilevel degenerative disc disease.  
2.  Mild central canal stenosis at L2-3 and L3-4. 
3.  Right sided foraminal stenosis L2-3, L3-4, and L4-5. 
4.  Left foraminal stenosis at T12-L1 and L1-2.  
5.  Mild lateral recess narrowing which is most pronounced at L2-3 on the right and L1-2 on the left.

## 2007-07-11 IMAGING — CR DG CHEST 2V
2 series · 2 of 2 positions shown · non-contrast
Comparison: None.

CLINICAL DATA: Preop for right shoulder arthroplasty.  
 CHEST - 2 VIEW:

[view not recorded (1 of 2)]
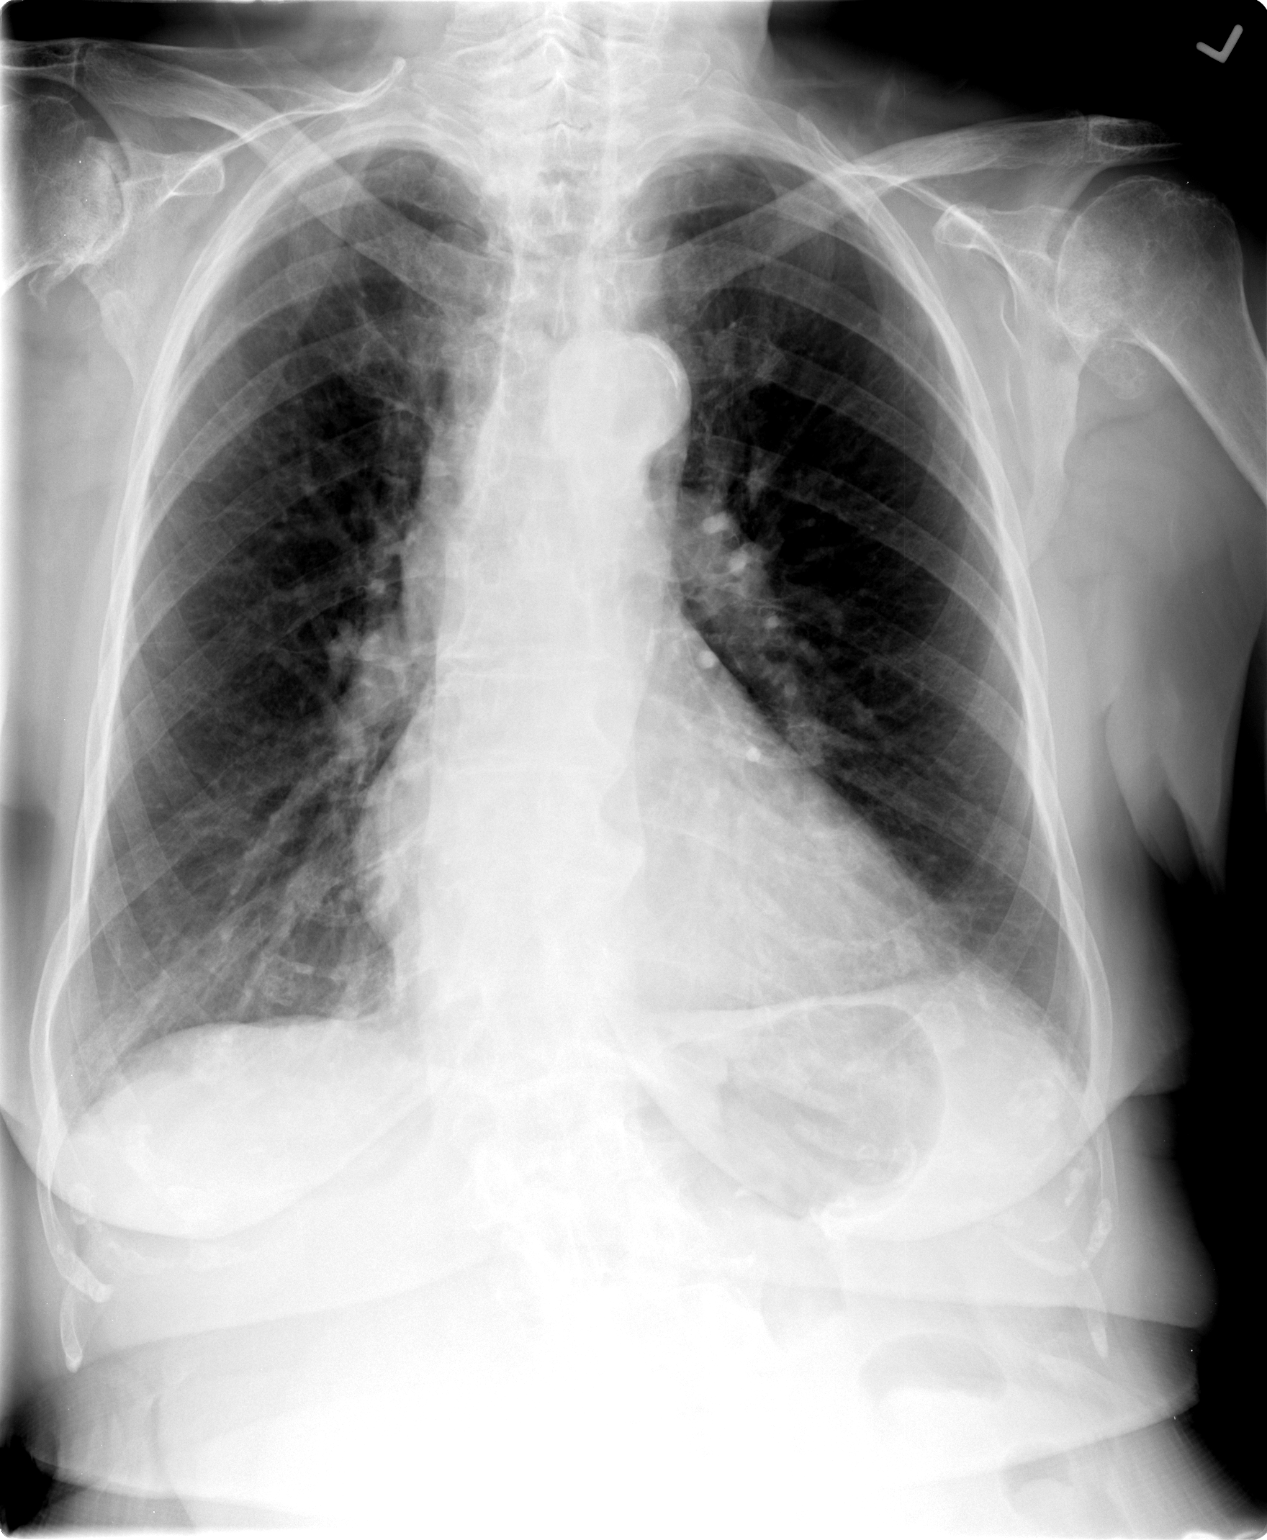

[view not recorded (2 of 2)]
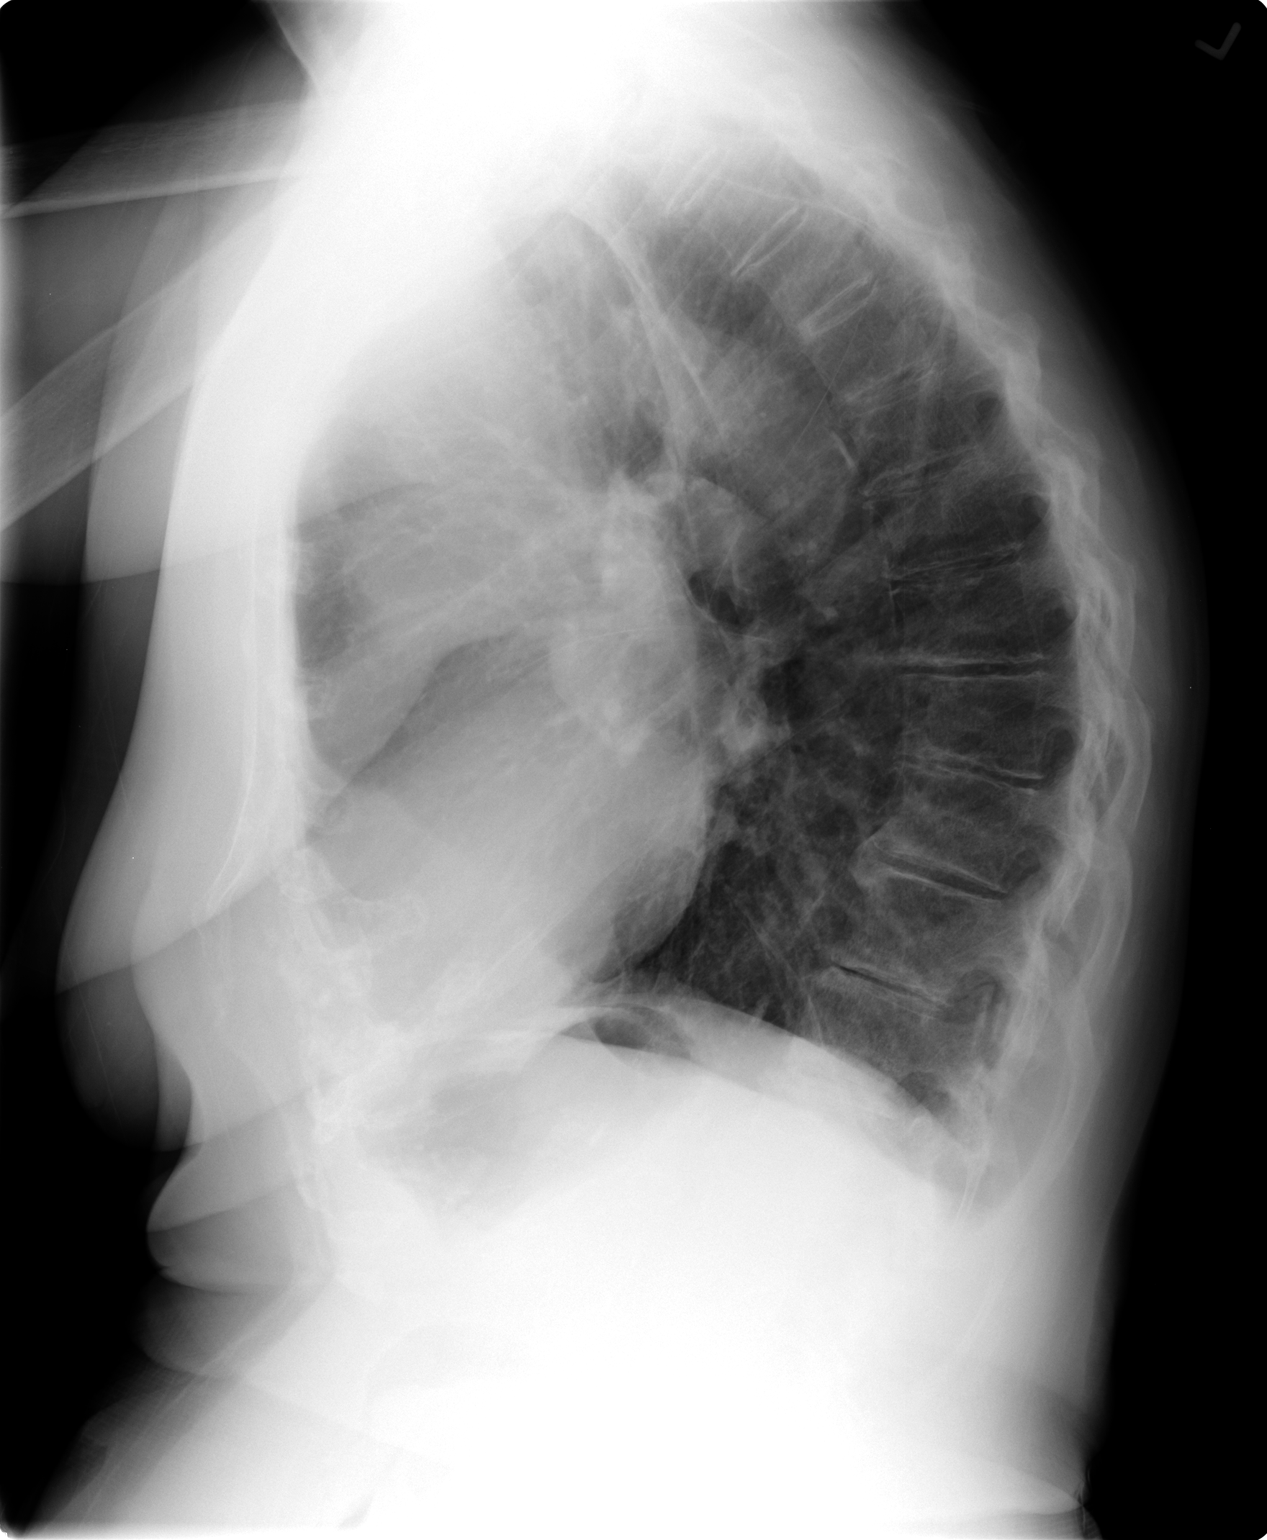

[2 of 2 positions shown; findings below may reference images not displayed]

FINDINGS: The heart is enlarged moderately.   There is no congestive heart failure. There are changes of COPD with no acute process.  There is rather marked thoracolumbar scoliosis.
IMPRESSION: Cardiomegaly.  COPD and scoliosis ? no active disease.

## 2007-07-19 ENCOUNTER — Inpatient Hospital Stay (HOSPITAL_COMMUNITY): Admission: RE | Admit: 2007-07-19 | Discharge: 2007-07-21 | Payer: Self-pay | Admitting: Orthopedic Surgery

## 2007-07-19 IMAGING — CR DG SHOULDER 2+V PORT*R*
1 series · 1 of 1 positions shown · non-contrast
Comparison: none

CLINICAL DATA: Osteoarthritis of the right shoulder.  Status post right total shoulder replacement.  
 AP PORTABLE RIGHT SHOULDER ? 1 VIEW [DATE]:

[view not recorded]
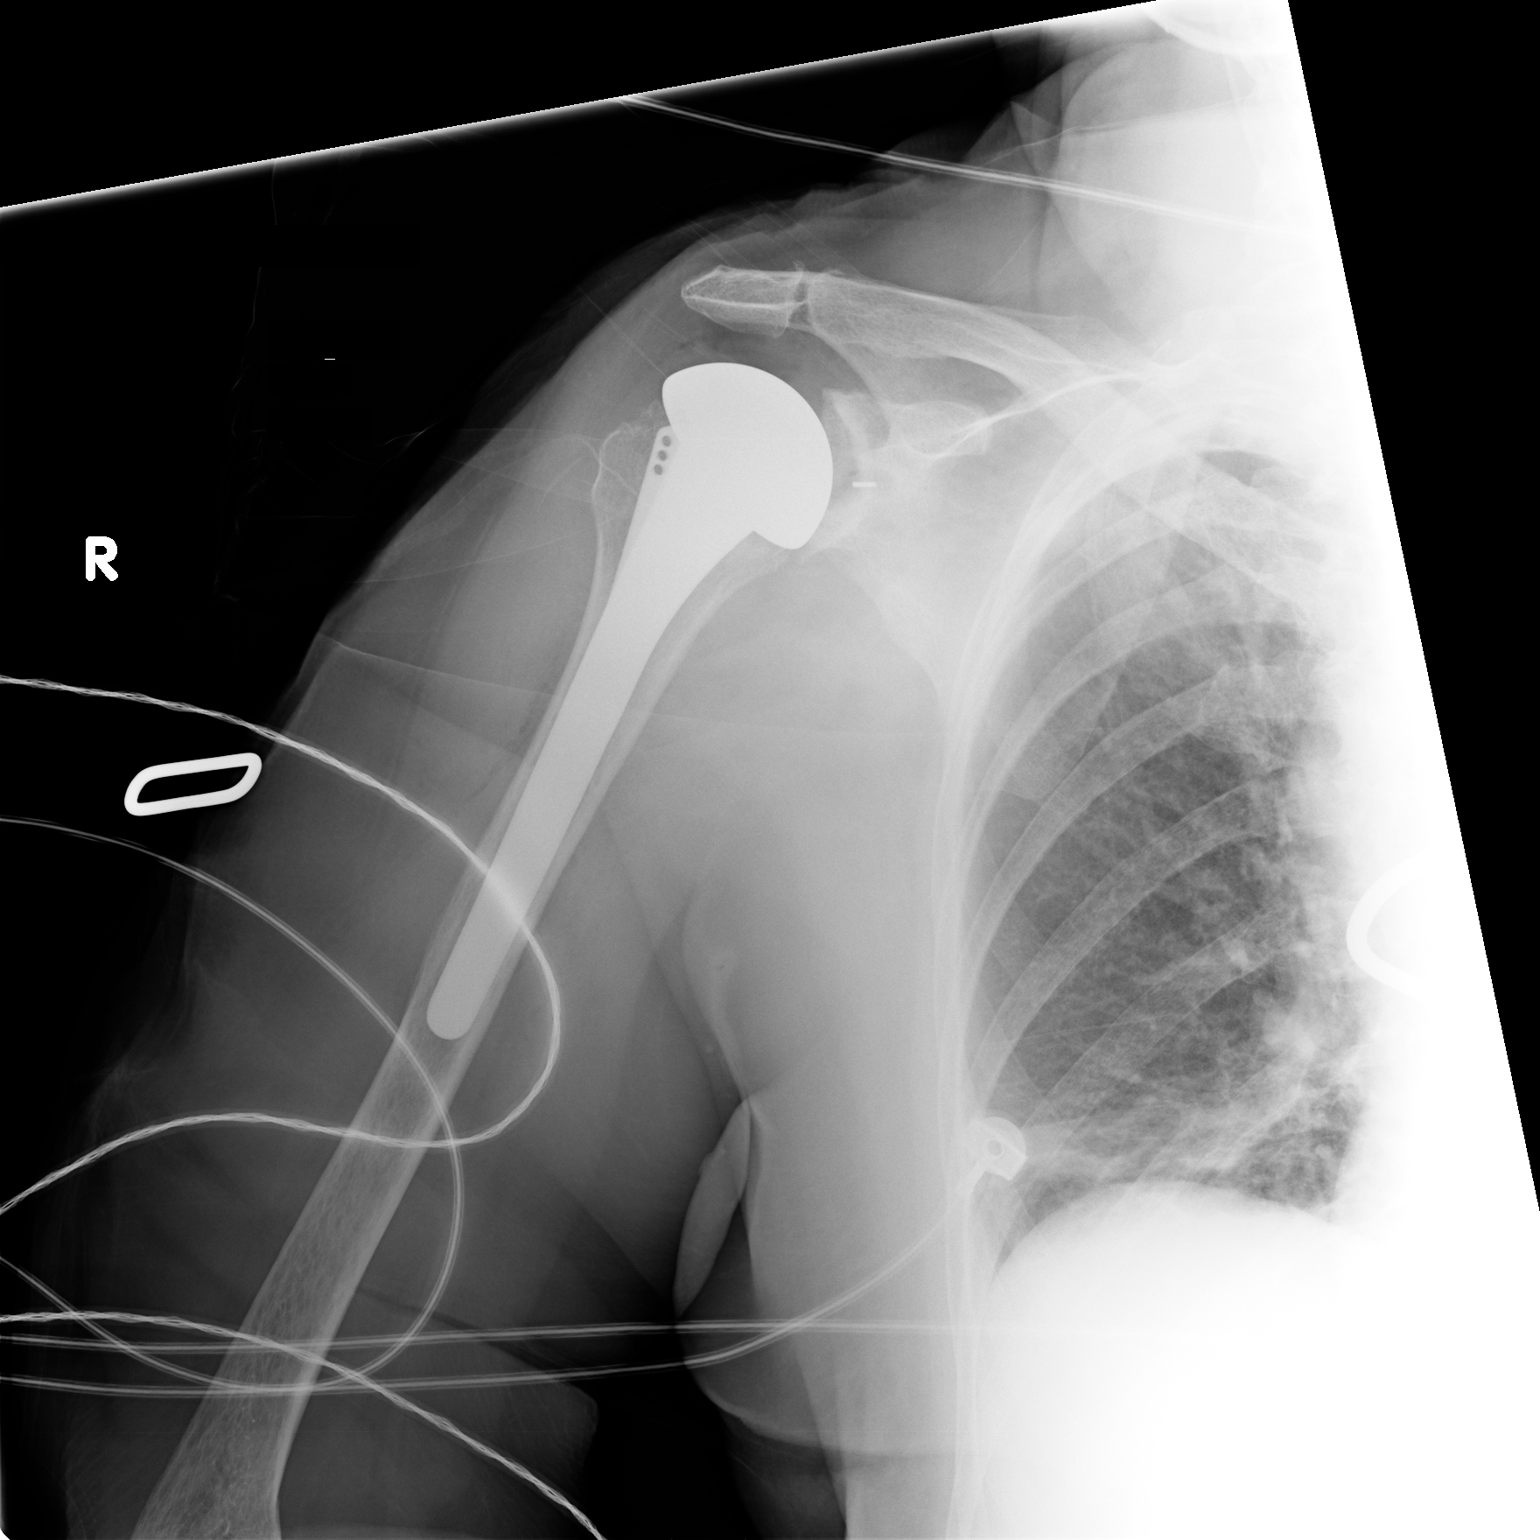

[1 of 1 positions shown; findings below may reference images not displayed]

FINDINGS: The shoulder replacement prosthesis appears in good position.  No visible fractures.
IMPRESSION: Prosthesis appears in good position in the AP projection.

## 2007-09-13 ENCOUNTER — Encounter: Admission: RE | Admit: 2007-09-13 | Discharge: 2007-11-28 | Payer: Self-pay | Admitting: Orthopedic Surgery

## 2010-09-17 ENCOUNTER — Other Ambulatory Visit: Payer: Self-pay | Admitting: Obstetrics and Gynecology

## 2010-09-30 NOTE — H&P (Signed)
[name], GLAD NO.:  [id number]   MEDICAL RECORD NO.:  [id number]           PATIENT TYPE:   LOCATION:                                 FACILITY:   PHYSICIAN:  Almedia Balls. Ranell Patrick, M.D. DATE OF BIRTH:  June 02, 1930   DATE OF ADMISSION:  DATE OF DISCHARGE:                              HISTORY & PHYSICAL   CHIEF COMPLAINT:  Right shoulder pain.   HISTORY OF PRESENT ILLNESS:  The patient is a 77 year old female with  worsening right shoulder pain secondary to osteoarthritis with some  decreased range of motion that has been refractory to conservative  treatment.  The patient has elected to have a right total shoulder  arthroplasty by Dr. Malon Kindle.   PAST MEDICAL HISTORY:  1. Hypertension.  2. Osteoarthritis.   FAMILY MEDICAL HISTORY:  Coronary artery disease.   SOCIAL HISTORY:  The patient is married, a patient of Dr. Selena Batten.  Does not  smoke or use alcohol.   DRUG ALLERGIES:  SULFA.   MEDICATIONS:  1. Micardis 80 mg p.o. q day.  2. Aspirin 81 mg p.o. q day.  3. Atenolol 50 mg p.o. q day.  4. Doxazosin 8 mg p.o. q day.  5. Lasix 40 mg p.o. q day.  6. Crestor 5 mg p.o. q day.  7. Cilostazol 100 mg b.i.d.  8. Vitamin D 1000 units daily.  9. Caltrate 600 mg b.i.d.   REVIEW OF SYSTEMS:  Negative except for pain with range of motion right  greater than the left shoulder.   PHYSICAL EXAMINATION:  VITAL SIGNS:  Pulse 82, respirations 18, blood  pressure 128/78.  GENERAL:  The patient is a 77 year old female in no acute distress,  pleasant and oriented  X3.  NECK:  Shows full range of motion without any tenderness.  CHEST:  Active breath sounds bilaterally with no wheezes, rhonchi or  rales.  HEART:  Shows a regular rate and rhythm with no murmur.  ABDOMEN:  Nontender, nondistended with active bowel sounds.  EXTREMITIES:  Showed moderate tenderness with range of motion in the  bilateral shoulders with moderate crepitus and pain right greater than  left.  She has forward flexion to about 60 degrees, external rotation to  10 degrees, internal rotation to posterior beltline.  Capillary refill  was 2 seconds.  SKIN:  She has no rashes.  NEUROVASCULAR:  She is intact in bilateral upper and lower extremities.   X-RAYS:  Shows osteoarthritis right shoulder.   IMPRESSION:  Right end-stage osteoarthritis to her shoulder.   PLAN:  To have a right total shoulder arthroplasty by Dr. Malon Kindle.      Thomas B. Durwin Nora, P.A.      Almedia Balls. Ranell Patrick, M.D.  Electronically Signed    TBD/MEDQ  D:  07/09/2007  T:  07/10/2007  Job:  [id number]

## 2010-09-30 NOTE — Op Note (Signed)
[name], [name] NO.:  [id number]   MEDICAL RECORD NO.:  [id number]          PATIENT TYPE:  INP   LOCATION:  1540                         FACILITY:  [facility]   PHYSICIAN:  Almedia Balls. Ranell Patrick, M.D. DATE OF BIRTH:  May 30, 1930   DATE OF PROCEDURE:  07/19/2007  DATE OF DISCHARGE:  07/21/2007                               OPERATIVE REPORT   PREOPERATIVE DIAGNOSIS:  Right shoulder osteoarthritis, end-stage.   POSTOPERATIVE DIAGNOSIS:  Right shoulder osteoarthritis, end-stage.   PROCEDURE PERFORMED:  Right shoulder, total shoulder replacement using  DePuy Global Advantage system.   ATTENDING SURGEON:  Almedia Balls. Ranell Patrick, MD   ASSISTANT:  Betha Loa, PA-C   ANESTHESIA:  General anesthesia was used.   ESTIMATED BLOOD LOSS:  Less than 200 mL.   FLUID REPLACEMENT:  1700 mL of Crystalloid.   URINE OUTPUT:  200 mL.   COUNTS:  Instrument count was correct.   COMPLICATIONS:  None.   MEDICATIONS:  Perioperative antibiotics were given.   INDICATIONS:  The patient is a 77 year old female with a history of  worsening right shoulder pain secondary to arthritis.  The patient has  had progressive pain despite conservative management including  injections, activity modification and therapy.  She presents now with  significant functional loss and pain, desiring total shoulder  replacement.  Informed consent was obtained.   DESCRIPTION OF PROCEDURE:  After an adequate level of anesthesia was  achieved, the patient was positioned in modified beach chair position.  The right shoulder examined under anesthesia.  The patient had passive  external rotation of about 30 degrees.  Internal rotation 30 degrees  with the arm abducted, forward elevation about 95 degrees.  Following  sterile prep and drape of the shoulder, we entered the shoulder through  a deltopectoral approach.  We started at the coracoid process, extending  down to the anterior humerus.  Dissection was  carried sharply down  through the subcutaneous tissues using Bovie.  We identified the  cephalic vein and took the cephalic vein laterally with the deltoid and  the pectoralis medially.  We identified the conjoined tendon and took  that medially.   Next the patient's subscapularis was identified and we divided the  subscapularis at 1 cm medial to its insertion on the lesser tuberosity.  We placed #2 FiberWire sutures in the subscapularis for ventral repair.  next we progressively externally rotated the humerus, releasing soft  tissues inferiorly and removing osteophytes and loose bodies.  Once we  had adequate exposure, we went ahead and made our humeral cut with the  humerus 10 degrees anteverted.  Basically with the elbow at the side,  the forearm was rotated externally 10 degrees, providing for a 10 degree  retroverted cut.  We did this with an oscillating saw, protecting the  inferior portion of the joint.  We then went ahead and prepared the  humerus by sequentially reaming and then broaching up to a size 10  Global Advantage stem, which we impacted into place with the appropriate  version.  :We next went ahead and placed our retractors for exposure of  the glenoid.  We released the glenoid 360 degrees around the glenoid  face, removing the labrum and releasing the capsule.  We had good  exposure of the glenoid and were able to prepare that for a size 40  glenoid.  This is a Keeled glenoid implant, which we prepared using the  appropriate drill guides and Keel punch.  We went ahead and trialed our  glenoid, which was a size 40 glenoid with a size 44 head, which provided  for appropriate coverage on the humeral side.  We reduced the shoulder.  This was a 44 x 18 head.  We had excellent soft tissue balance, with the  ability to come easily across the body and also to translate the humeral  head to about 50% of the diameter of the glenoid surface.  Once we did  that we went ahead and  removed our trial components and we then  impaction grafted the proximal humerus since we impacted the real global  Advantage 10 stem into the humerus.  We next went ahead and selected the  44 eccentric 18 mm head.  Prior to impacting that into place, we went  ahead and cemented our glenoid into place, which we did with minimal  cement directly in the [place] hole, and a little bit on the face of the  glenoid.  Once that cement was allowed to harden, we checked stability  of the implant by pushing on it.  It did not rock.  It did not displace.  Then we went ahead and impacted our head in place, reduced the shoulder  and were happy with the fit and the soft tissue balance.  We went ahead  and closed our subscap using the #2 FiberWire suture directly through  bone out laterally, with an anatomic repair of the subscapularis and  then oversewing up at the rotator interval.  We were pleased with the  subscap repair, thoroughly irrigating the shoulder and then closing the  deltopectoral interval with 0 Vicryl suture, followed by 2-0 Vicryl  subcutaneous closure, nd 4-0 Monocryl for skin.  Steri-Strips were  applied, followed by a sterile dressing.  The patient tolerated surgery  well.      Almedia Balls. Ranell Patrick, M.D.  Electronically Signed     SRN/MEDQ  D:  08/04/2007  T:  08/04/2007  Job:  [id number]

## 2010-10-03 NOTE — Op Note (Signed)
[name], DELPRIORE NO.:  [id number]   MEDICAL RECORD NO.:  [id number]          PATIENT TYPE:  AMB   LOCATION:  ENDO                         FACILITY:  [facility]   PHYSICIAN:  Georgiana Spinner, M.D.    DATE OF BIRTH:  1931/03/01   DATE OF PROCEDURE:  06/03/2005  DATE OF DISCHARGE:                                 OPERATIVE REPORT   PROCEDURE PERFORMED:  Colonoscopy.   INDICATIONS FOR PROCEDURE:  Colon polyp.   ANESTHESIA:  Demerol 50 mg, Versed 7.5 mg.   ENDOSCOPIST:  Georgiana Spinner, M.D.   DESCRIPTION OF PROCEDURE:  With the patient mildly sedated in the left  lateral decubitus position, the Olympus video colonoscope was inserted into  the rectum and passed under direct vision to the cecum with pressure applied  to the abdomen.  The cecum was identified by ileocecal valve and appendiceal  orifice, both of which were photographed.  From this point the colonoscope  was slowly withdrawn, taking circumferential views of the colonic mucosa  stopping only in the rectum where a polyp was seen, photographed and removed  using snare cautery technique setting of 20/200 blended current.  The  endoscope was then placed on retroflexion to view the anal canal from above.  The patient's vital signs and pulse oximeter remained stable.  The patient  tolerated the procedure well without apparent complications.   FINDINGS:  Polyp of rectum.  Otherwise unremarkable colonoscopic examination  to the cecum.   PLAN:  Await biopsy report.  The patient will call me for results and follow  up with me as an outpatient.           ______________________________  Georgiana Spinner, M.D.     GMO/MEDQ  D:  06/03/2005  T:  06/03/2005  Job:  [id number]

## 2010-10-03 NOTE — H&P (Signed)
[name], [name]              ACCOUNT NO.:  [id number]   MEDICAL RECORD NO.:  [id number]          PATIENT TYPE:  INP   LOCATION:  NA                           FACILITY:  [facility]   PHYSICIAN:  Madlyn Frankel. Charlann Boxer, M.D.  DATE OF BIRTH:  1930/12/28   DATE OF ADMISSION:  06/22/2006  DATE OF DISCHARGE:                              HISTORY & PHYSICAL   PROCEDURE:  A left total hip arthroplasty.   CHIEF COMPLAINT:  Left hip pain.   HISTORY OF PRESENT ILLNESS:  This 77 year old female with persistent  progressive left hip pain with advanced degenerative joint disease.  She  has been refractory to all conservative treatment.  She has had a  diminished quality of life, has tried all other treatment modes which  have failed to provide significant release.  She has been cleared by her  cardiologist as well as her primary care physician for surgery and has  subsequently been scheduled for a left total hip replacement.   PAST MEDICAL HISTORY:  1. Hypertension.  2. Peripheral vascular disease.  3. Osteoarthritis.  4. Hypercholesteremia.  5. Coronary artery disease.   PAST SURGICAL HISTORY:  1. Partial hysterectomy.  2. Bladder tack.  3. Bilateral cataracts.  4. Appendectomy.  5. Right total knee replacement.   FAMILY HISTORY:  Married to husband, Laban Emperor, who will be the primary  caregiver after surgery.   DRUG ALLERGIES:  PENICILLIN AND SULFA DRUGS.   MEDICATIONS:  1. Micardis 80 mg 1 p.o. daily.  2. Atenolol 50 mg 1 p.o. daily.  3. Cardura 8 mg 1/2 tablet p.o. b.i.d.  4. Aspirin 81 mg 1 p.o. daily.  5. Mobic 50 mg 1 p.o. daily.  6. Caltrate 1 p.o. b.i.d.  7. Lasix 40 mg 1 p.o. daily.  8. Pletal 100 mg 1/2 tab p.o. b.i.d.  9. Crestor 5 mg 1 p.o. q.h.s.   FAMILY HISTORY:  Significant for coronary artery disease.   REVIEW OF SYSTEMS:  The patient has been cleared pertaining to all  cardiovascular, respiratory, gastrointestinal, genitourinary,  neurological systems and no other  musculoskeletal system complaints.  The patient has been seen by Dr. Aleen Campi and Dr. Penni Bombard in the past.   PHYSICAL EXAM:  VITAL SIGNS:  Temperature 97.2, pulse 72, respirations  18, blood pressure 124/56.  GENERAL:  Awake, alert and oriented, well-developed, well-nourished  female, appears to be in no acute distress.  NECK:  Supple.  No carotid bruits.  CHEST/LUNGS:  Clear to auscultation bilaterally.  BREASTS:  Deferred.  HEART:  Regular rate and rhythm without gallops, clicks, rubs or  murmurs.  ABDOMEN:  Soft, nontender, nondistended.  Bowel sounds present.  GENITOURINARY:  Deferred.  EXTREMITIES:  Internal range of motion increases hip pain.  Limited hip  flexion, abduction due to pain.  SKIN:  No signs of cellulitis, cap refill intact.  NEUROLOGIC:  Intact distal sensibilities.   LABS:  Pending January 29, Shelly presurgical clearance exam.   X-rays of the pelvis and left hip reviewed.   IMPRESSION:  1. Left hip osteoarthritis.  2. Degenerative joint disease.   PLAN:  Left total  hip arthroplasty by surgeon Dr. Durene Romans at [facility] on June 22, 2006.  Risks and complications were  discussed.  Questions were encouraged answered and reviewed.     ______________________________  Yetta Glassman [address], [place]      Madlyn Frankel. Charlann Boxer, M.D.  Electronically Signed    BLM/MEDQ  D:  06/09/2006  T:  06/09/2006  Job:  [id number]   cc:   Antionette Char, MD  Fax: [phone]   Dr. Janey Greaser

## 2010-10-03 NOTE — H&P (Signed)
[name], [name] NO.:  [id number]   MEDICAL RECORD NO.:  [id number]           PATIENT TYPE:   LOCATION:                               FACILITY:  [facility]   PHYSICIAN:  Erasmo Leventhal, M.D.DATE OF BIRTH:  1931/04/23   DATE OF ADMISSION:  08/13/2005  DATE OF DISCHARGE:                                HISTORY & PHYSICAL   CHIEF COMPLAINT:  End-stage osteoarthritis right knee.   HISTORY OF PRESENT ILLNESS:  Briefly, this is a 77 year old lady with a  history of end-stage osteoarthritis of her right knee with failure of  conservative treatment to manage her pain.  Due to her continued pain and  difficulty with daily activities she is now scheduled for total knee  arthroplasty of her right knee. The surgery risks, benefits and aftercare  were discussed in detail with the patient, questions were invited and  answered and surgery as above is scheduled.   PRIMARY CARE PHYSICIANS:  Her medical doctor is Janae Bridgeman. Lendell Caprice, M.D.  and her vascular surgeon for her history of peripheral artery disease of the  left leg is Dr. Virginia Rochester.   PAST MEDICAL AND SURGICAL HISTORY:  Includes ovarian cystectomy, bladder  tack, cataracts both eyes and what sounds like an angioplasty for peripheral  artery disease in the left leg.   ALLERGIES:  Questionable to PENICILLIN or SULFA.  She had some bleeding from  her nose when she took one or the other of the medications as a child and  has not taken them since.   CURRENT MEDICATIONS:  1.  Micardis 80 mg one p.o. daily.  2.  Doxazosin 8 mg one-half tablet b.i.d.  3.  Atenolol 50 mg p.o. daily.  4.  Lasix 40 mg one p.o. daily.  5.  Crestor 5 mg one p.o. daily.  6.  Cilostazol, which is a generic for Pletal, 100 mg b.i.d.   SOCIAL HISTORY:  The patient is married and lives at home. She is a  [employer] trader. She does not smoke or drink.   FAMILY HISTORY:  Patient has coronary artery disease, cancer, osteoarthritis  in the family.   REVIEW OF SYSTEMS:  CNS:  Negative for headache, blurred vision or  dizziness.  PULMONARY: Negative for shortness of breath, paroxysmal  nocturnal dyspnea or orthopnea. CARDIOVASCULAR:  Positive for hypertension  and peripheral artery disease in the left leg.  GASTROINTESTINAL:  Positive  for hemorrhoids.  GENITOURINARY:  Negative for urinary tract difficulty.  MUSCULOSKELETAL:  Positive as in history of present illness.   PHYSICAL EXAMINATION:  GENERAL APPEARANCE:  Physical examination reveals a  well-developed, well-nourished lady in no acute distress.  VITAL SIGNS:  Blood pressure 168/80, respirations 16, pulse 68 and regular.  HEENT:  Head normocephalic. Nose patent, ears patent. Pupils equal, round,  reactive to light, status post cataract bilaterally.  Nose patent.  Throat  without injection.  NECK:  Supple without adenopathy.  Carotids 2+ without bruits.  CHEST:  Clear to auscultation with no rales, rhonchi. Respirations 16.  HEART:  Regular rate and rhythm at 68 beats per minute without murmurs.  ABDOMEN:  Soft with active bowel sounds.  No masses, organomegaly.  NEUROLOGICAL:  Patient is alert and oriented to time, place and person.  Cranial nerves II-XII are grossly intact.  EXTREMITIES:  Show the right knee with a varus deformity and 5 degrees  flexion contracture, further flexion to 120 degrees. Dorsalis pedis pulse  and posterior tibialis pulses are 2+.  Her left leg shows normal  neurovascular status and dorsalis pedis pulse and posterior tibialis pulses  1+.  NEUROVASCULAR:  Status is normal.   CLINICAL DATA:  X-rays show end-stage osteoarthritis of the right knee.   IMPRESSION:  End-stage osteoarthritis of the right knee.   PLAN:  Total knee arthroplasty, right knee.      Jaquelyn Bitter. Chabon, P.A.    ______________________________  Erasmo Leventhal, M.D.    SJC/MEDQ  D:  07/30/2005  T:  07/31/2005  Job:  [id number]   cc:   Janae Bridgeman. Eloise Harman., M.D.  Fax:  [phone]   Dr. Virginia Rochester, Vascular Surgeon

## 2010-10-03 NOTE — Discharge Summary (Signed)
[name], [name] NO.:  [id number]   MEDICAL RECORD NO.:  [id number]          PATIENT TYPE:  INP   LOCATION:  1540                         FACILITY:  [facility]   PHYSICIAN:  Almedia Balls. Ranell Patrick, M.D. DATE OF BIRTH:  1930-08-24   DATE OF ADMISSION:  07/19/2007  DATE OF DISCHARGE:  07/21/2007                               DISCHARGE SUMMARY   ADMISSION DIAGNOSIS:  Right shoulder pain secondary to severe  osteoarthritis.   DISCHARGE DIAGNOSIS:  Right shoulder pain secondary to severe  osteoarthritis, status post total shoulder arthroplasty.   BRIEF HISTORY:  The patient is a 77 year old female with worsening right  shoulder pain secondary to osteoarthritis.  Patient has elected have a  right total shoulder arthroplasty by Dr. Malon Kindle.   PROCEDURE:  The patient had a right total shoulder arthroplasty using  DePuy Global System by Dr. Malon Kindle on July 19, 2007.  Assistant  was [employer], PA-C.  General anesthesia was used.  Fluid replacement  was 1500 mL.  Estimated blood loss was 150 mL.  No complications.   HOSPITAL COURSE:  The patient was admitted on July 19, 2007, for the  above-stated procedure which she tolerated well.  After adequate time in  the post anesthesia care unit, she was transferred 6-East.  Postop day  #1, the patient complained of only mild tenderness to the right  shoulder, otherwise was doing okay.  She was able to work with physical  therapy quite well and after physical therapy on postop day #2, she was  to be discharged home with very minimal pain and her labs were within  normal limits.   DISCHARGE MEDICATIONS:  1. Percocet 5/325 1-10 q.4-6h. p.r.n. pain.  2. Robaxin 500 mg p.o. q.6h.  3. Darvocet N 100 1-2 tablets q.4-6h. p.r.n. pain.   FOLLOW-UP:  Patient will follow back up with Dr. Malon Kindle in two  weeks.   CONDITION:  Stable.   DIET:  Low-sodium.   ALLERGIES:  She has allergies to PENICILLIN and  SULFA.      Thomas B. Durwin Nora, P.A.      Almedia Balls. Ranell Patrick, M.D.  Electronically Signed    TBD/MEDQ  D:  08/05/2007  T:  08/05/2007  Job:  [id number]

## 2010-10-03 NOTE — Cardiovascular Report (Signed)
NAME:  [name], [name]                        ACCOUNT NO.:  [id number]   MEDICAL RECORD NO.:  [id number]                   PATIENT TYPE:  OIB   LOCATION:  2899                                 FACILITY:  [facility]   PHYSICIAN:  Aram Candela. Tysinger, M.D.              DATE OF BIRTH:  06-01-1930   DATE OF PROCEDURE:  08/27/2003  DATE OF DISCHARGE:                              CARDIAC CATHETERIZATION   PROCEDURES:  1. Abdominal aortogram with bilateral lower extremity runoff.  2. Angio-Seal of the right femoral artery.   INDICATION FOR PROCEDURES:  This 77 year old female has a history of mild  claudication in her left lower extremity which has been limiting her  activity.  She had ABIs done in the office which showed a marked decrease in  pressure in her left SFA area and abnormal ABIs on the left.  She was then  scheduled for angiography after an ultrasound of her left lower extremity  revealed probability of patent left SFA and with the possibility of having  angioplasty and stent.   PROCEDURE:  After signing an informed consent, the patient was premedicated  with 5 mg of Valium by mouth and brought to the peripheral vascular  angiography lab on the 6th floor at [facility].  Her right groin  was prepped and draped in sterile fashion and anesthetized locally with 1%  lidocaine.  A 5 French introducer sheath is inserted percutaneously into the  right femoral artery.  5 French short pigtail catheter was inserted over  Seldinger wire to the abdominal aorta where midstream injections were made  visualizing the abdominal aorta, pelvic arteries and runoff was performed in  both lower extremities.  The patient tolerated the procedure well and no  complications were noted.  At the end of the procedure, the catheter and  sheath were removed from the right femoral artery and hemostasis was easily  obtained with an Angio-Seal closure system.   MEDICATIONS GIVEN:  None.   CINE FINDINGS:  1. Abdominal aorta:  The suprarenal abdominal aorta is normal in appearance.     The infrarenal abdominal aorta has mild calcification and segmental     plaque causing a 20% stenosis.  There was one area distally with raised     plaque or ulceration that appears to be a very mild aneurysm.  The     bifurcation appears normal.  Both common iliac and external iliac     arteries appear normal.  Both common femoral arteries appear normal.  2. Left SFA:  Left SFA has a critical stenosis throughout 2/3 of its length     starting at the takeoff and extending to the canal and there si very slow     flow through the lesion.  There are multiple sites of 99 or greater     percent stenosis.  There is very good collateral flow through the     profundus with reconstitution  below the canal and normal appearing flow     in the popliteal artery.  Below the knee on the left, she has three-     vessel runoff and all three vessels appear normal.  3. Right lower extremity:  The right SFA has a mild 20% stenosis in its     proximal segment.  Otherwise, the right SFA appeared normal with very     good flow.  4. The popliteal and arteries below the knee appear normal.   FINAL DIAGNOSES:  1. Severe stenosis left superficial femoral artery with a very long 99%     stenosis.  2. Mild stenosis distal abdominal aorta with appearance of ulceration in one     area.  3. Mild stenosis right superficial femoral artery 20%.  4. Normal vessels below the knee bilaterally with three-vessel runoff.   DISPOSITION:  Will elect to treat medically and do intervention in her left  SFA because of the length of involvement.  If clinically she deteriorates  she would then be a candidate for fem/pop bypass grafting.                                               John R. Tysinger, M.D.    JRT/MEDQ  D:  08/27/2003  T:  08/27/2003  Job:  [id number]

## 2010-10-03 NOTE — Discharge Summary (Signed)
[name], [name] NO.:  [id number]   MEDICAL RECORD NO.:  [id number]          PATIENT TYPE:  INP   LOCATION:  1510                         FACILITY:  [facility]   PHYSICIAN:  Erasmo Leventhal, M.D.DATE OF BIRTH:  Mar 04, 1931   DATE OF ADMISSION:  08/13/2005  DATE OF DISCHARGE:  08/18/2005                                 DISCHARGE SUMMARY   ADMITTING DIAGNOSIS:  End-stage osteoarthritis right knee.   DISCHARGE DIAGNOSIS:  End-stage osteoarthritis right knee.   OPERATION:  Total knee arthroplasty right knee.   BRIEF HISTORY:  This is a 77 year old lady with a history of end-stage  osteoarthritis right knee with failure of conservative treatment to manage  her pain.  Due to her continuing difficulty she is scheduled now for total  knee arthroplasty.  The surgery risks, benefits, and aftercare were  discussed with the patient and questions invited and answered.   LABORATORY VALUES:  Admission CBC within normal limits.  Hemoglobin and  hematocrit reached a low of 9.5 and 27.9 on August 15, 2005, and by April 1  were back up to 10.2 and 30.2.  PT/PTT within normal limits on admission and  at discharge PT 19.4 with INR 1.6.  She had over-shot a little bit on April  1 at 28.4 with an INR of 2.7.  admission CMET within normal limits with the  exception of slightly elevated glucose at 104 and slightly low total protein  at 5.9.  She was mildly hyponatremic at 3.4 on April 1 and 3.0 and April 3,  and ran mildly elevated glucose throughout admission.  Urinalysis within  normal limits.   COURSE IN THE HOSPITAL:  The patient tolerated the operative procedure well.  First postoperative day, vital signs stable, she was afebrile.  I&Os were  good.  Drain was 350 and drain was removed without difficulty.  Calves were  negative, dressing was dry.  IV fluid was switched to normal saline at 75 an  hour due to mild hyponatremia.  The second postoperative day the patient was  alert and oriented and comfortable, doing her exercising well.  The dressing  was changed, wound was benign.  Calves were negative.  INR was 2.1.  The  wound was without complications and physical therapy was continued.  The  third postoperative day the patient was alert and oriented, sitting up.  Temperature was to a maximum of 101.4.  Hemoglobin and hematocrit were  stable at 10.2 and 30.2.  INR was 2.7.  Wound was benign, calves were  negative.  Labs were rechecked in the morning as she had a slightly low  potassium at 3.4.  The fourth postoperative day she was feeling good.  Vital  signs were stable, temperature 99.9.  Potassium was 3.3.  Lungs were clear,  heart sounds normal.  Dressing was changed, wound was benign, calves were  negative, bowel sounds were active.  She was placed on K-Dur for mild  hypokalemia at 3.3.  On the fifth postoperative day vital signs were stable,  temperature was 100.6 maximum, now 99.3, O2 was 94, potassium was 3.0, INR  1.6.  Lungs were clear, heart sounds normal, bowel sounds active.  Wound was  benign and calves were negative.  I spoke with the patient's regular medical  doctor, Dr. Higinio Plan, about her potassium and he recommended K-Dur 20  mEq p.o. t.i.d. for 48 hours and then recheck her laboratory on an  outpatient basis.  Subsequently the patient was discharged home for followup  on an outpatient basis with Dr. Lendell Caprice for recheck of labs in 2 days, and  with [place] in 10 days for recheck of her orthopedic wounds.   CONDITION ON DISCHARGE:  Improved.   DISCHARGE MEDICATIONS:  1.  Percocet one to two q.4-6h. p.r.n. pain.  2.  Robaxin one q.8h. p.r.n. spasm.  3.  Trinsicon one twice a day for anemia.  4.  Coumadin per pharmacy protocol.  5.  K-Dur one pill t.i.d. for 2 days and recheck with Dr. Lendell Caprice in 2 days      for evaluation.   She is not to take her Pletal or aspirin until she has been off her Coumadin  for 2 days at home.  She will  return to the office to see Dr. Thomasena Edis in 2  weeks.      Jaquelyn Bitter. Chabon, P.A.    ______________________________  Erasmo Leventhal, M.D.    SJC/MEDQ  D:  09/03/2005  T:  09/04/2005  Job:  [id number]

## 2010-10-03 NOTE — Discharge Summary (Signed)
NAMESYLA, DEVOSS NO.:  [id number]   MEDICAL RECORD NO.:  [id number]          PATIENT TYPE:  INP   LOCATION:  1516                         FACILITY:  [facility]   PHYSICIAN:  [name]. [name], M.D.  DATE OF BIRTH:  23-Oct-1930   DATE OF ADMISSION:  06/22/2006  DATE OF DISCHARGE:  06/27/2006                               DISCHARGE SUMMARY   ADMITTING DIAGNOSES:  1. Osteoarthritis.  2. Hypertension.  3. Hypercholesteremia.  4. Coronary atherosclerosis.   DISCHARGE DIAGNOSES:  1. Osteoarthritis.  2. Hypertension.  3. Hypercholesteremia.  4. Coronary atherosclerosis.   CONSULTATIONS:  None.   PROCEDURE:  Left total hip replacement.   SURGEON:  Dr. Durene Romans.   ASSISTANT:  Dwyane Luo, PA-C.   PREADMISSION LABS:  Preadmission CBC, hemoglobin 11.5, hematocrit 34.0.  June 22, 2006, hemoglobin 10.5, hematocrit 30.6. Prior to discharge  hemoglobin 9.2, hematocrit 26.2.   White cell differential within normal limits.  Preadmission coagulation  within normal limits.   Routine chemistry preadmission, sodium 141, potassium 5.0, glucose 101.  Postop day #1, February 6, sodium 137, potassium 4.0, glucose 133.  Preadmission urinalysis negative.  Urinalysis negative.  Postoperative  pelvis showed good position and alignment.   Preadmission the patient was cleared preadmission by [facility] for cardiac clearance.   HISTORY OF PRESENT ILLNESS:  The patient is a 77 year old female with  persistent and progressive left hip pain with advanced degenerative  joint disease.  She has been refractory to all conservative treatment.  She has diminished quality of life, all treatments have failed.  She was  scheduled for a left total hip replacement by surgeon Dr. Durene Romans.   HOSPITAL COURSE:  The patient underwent left total hip replacement and  tolerated the procedure well.  On postop day #1, the patient was doing  well. The pain was  well-controlled and was well-controlled throughout  her course of stay.  Her dressing was changed after postop day #1 and  postop day #2 in a daily basis. It remained clean, dry and intact.  She  was neuromuscular and vascularly intact.  She was partial weightbearing  50% with PT and OT begun on postop day #1. PT note recommended home  health care PT.  Conference with the patient and spouse determined they  did not want skilled nursing facility and wanted to go with home health  care physical therapy.  Postop day #2, the patient continued to do well.   On June 26, 2006, the patient decided she wanted to go home. On  postop day #4, she was doing well, she was afebrile, her dressing was  changed, her wound was without active drainage and no sign of infection.  She was neuromuscular and vascularly intact. Postop day #5, she  continued to do well, she ambulated well. Yesterday she was seen in  rounds by Dr. Lequita Halt. She was neuromuscular and vascularly intact. Her  incision looked good. She was stable in improved condition, ready for  discharge home.   DISCHARGE DISPOSITION:  Discharged home with home health care, physical  therapy per [place]  home health care.   DISCHARGE MEDICATIONS:  1. Lovenox 1 subcu q. 24 x9 days.  2. Norco 1-2 p.o. q. 4-6 p.r.n. as needed for pain.  3. Robaxin 1 every 6 p.r.n. spasm.  4. Enteric-coated aspirin 325 mg 1 p.o. daily x4 weeks after Lovenox.  5. Colace 100 mg 1 p.o. b.i.d. p.r.n. constipation.  6. MiraLax 17 grams 1 p.o. daily p.r.n. constipation.  7. Iron 325 mg 1 p.o. t.i.d. x3 weeks.   DISCHARGE HOME MEDICATIONS:  1. Doxazosin 80 mg 1 p.o. 1/2 tablet b.i.d.  2. Atenolol 50 mg 1 p.o. q.a.m.  3. Crestor 5 mg one p.o. q.h.s.  4. Furosemide 40 mg 1 p.o. q.a.m.  5. Cilostazol 100 mg 1 p.o. b.i.d.  6. Micardis 80 mg 1 p.o. q.a.m.   DISCHARGE PHYSICAL THERAPY:  The patient is partial weightbearing 50%  with the use of a rolling  walker.  The goal  of physical therapy would  be to increase strength, minimize pain, maximize range of motion.  Will  require seven times per week ambulation and she was able to ambulate 220  feet upon her discharge. I also want to encourage independence in  activities of daily living.   DISCHARGE WOUND CARE:  Keep wound dry.  Change dressing daily.   DISCHARGE FOLLOWUP:  Follow up with Dr. Charlann [name] at [phone] in 2 weeks for  a wound care check.  If the patient develops any shortness of breath or  acute or severe calf pain please call emergency services immediately.     ______________________________  [name] [name], [place]      [name]. [name], M.D.  Electronically Signed    BLM/MEDQ  D:  07/23/2006  T:  07/23/2006  Job:  [id number]

## 2010-10-03 NOTE — Op Note (Signed)
NAMEKALILAH, BARUA NO.:  [id number]   MEDICAL RECORD NO.:  [id number]          PATIENT TYPE:  INP   LOCATION:  0003                         FACILITY:  [facility]   PHYSICIAN:  Madlyn Frankel. Charlann Boxer, M.D.  DATE OF BIRTH:  Apr 12, 1931   DATE OF PROCEDURE:  06/22/2006  DATE OF DISCHARGE:                               OPERATIVE REPORT   PREOPERATIVE DIAGNOSIS:  End-stage left hip osteoarthritis.   POSTOPERATIVE DIAGNOSIS:  End-stage left hip osteoarthritis.   PROCEDURE:  Left total hip replacement.   COMPONENTS USED:  Dupuy hip system with a size 50 Pinnacle cup, 36 metal  on metal liner neutral, a Trilock high offset 8.8 mm stem with a 36+  size ball.   SURGEON:  Madlyn Frankel. Charlann Boxer, M.D.   ASSISTANT:  Yetta Glassman. Mann, PA   ANESTHESIA:  Spinal plus MAC.   COMPLICATIONS:  None.   INDICATIONS FOR PROCEDURE:  Ms. Batley is a very pleasant 77 year old  female who is kindly referred over for surgical consideration after  findings of advanced degenerative hip disease with a presentation  initially of anterior thigh pain and knee pain.   They were a little bit reluctant initially to understand the process.  She realized that her hip disease was fairly significant, it was just  hard to understand that in relationship to the knee pain.   I discussed with her nonoperative intervention, however, based on the  advanced arthritis on radiographs, I did not think that any alternative  methods were going to provide any relief.  Her quality of life was  significantly diminished.  Risks and benefits were discussed in terms of  hip replacement surgery.  She had been through replacement surgery  before on the right knee.   DESCRIPTION OF PROCEDURE:  The patient was brought to the operative  theatre.  Once adequate anesthesia and preoperative antibiotics, 1 gram  of Ancef administered, the patient was positioned in the right lateral  decubitus position with the left side up.  Left  lower extremity was then  prepped and draped in a sterile fashion following a prescrub.   Lateral based incision was then made for a posterior approach of the  hip.  The iliotibial band and gluteus fascia was incised in line with  the incision.   Please note that in the preoperative position, I identified the position  of the left heel compared to the right heel with the knees in a parallel  position.  This was used for guidance in terms of leg lengthening with  the trial reductions.   The short external rotators were taken down separate from the posterior  capsule and an L capsulotomy was made for later anatomic repair as well  as to protect the sciatic nerve from retractors. The hip was dislocated  and the neck osteotomy was made based off the anatomic landmarks, based  on the tip of the trochanter.   Attention was first directed to the femur where the box osteotome was  used to set rotation.  I then hand-reamed once and irrigated the canal  to prevent fat emboli.  I  then began broaching the size 6 and carried up  to an 8.8 with good initial fixation at the level of the neck cut.   Given these findings, I attended to the acetabulum.  The acetabulum  exposure was obtained in routine fashion including labrectomy.  With the  acetabulum exposed, I began reaming with a 43 reamer.  Please note, the  patient was noted to have severe degenerative changes to her femoral  head with an unusual finding of a cobblestone appearance to her femoral  head causing erosive changes of the acetabulum, definitely accounting  for a severe amount of pain.  Acetabular reaming was carried down to the  medial wall to allow for cup coverage.  I reamed to a 49 reamer with  good bony bed preparation.  There was acetabular cysts that were  curetted out and bone grafted with femoral head bone graft.  A final 50  mm cup was then impacted with about 35-40 degrees of abduction and 20  degrees of forward flexion.  A  single cancellous screw was placed based  on the initial scratch fit.  A trial liner placed.  Attention was then  redirected back to the femur.  The broach was placed at level of neck  cut.  Initial trial with a standard neck based on the radiographs, but I  felt there was a lot of impingement anteriorly due to either anterior  femoral trochanter or a combination of that and her inner ilium.  For  that reason, I used the high offset neck.  Initially I trialed with a  1.5 ball and then with a +5 ball.  I chose the +5 ball because despite  removing some osteophytes and debriding some capsule anteriorly as well  as some of the trochanter anteriorly.  I was trying to eliminate any  types of impingement.   Compared to the down leg and compared to the preoperative evaluation  with the knees parallel to one another, I felt that her leg lengths were  very close if not about 1 mm off and I did not think the difference in  the +1.5 to 5 mm head size and the 1.5 mm of length would effect  anything, but it did improve to prevent impingement.  Given all of these  findings, the trial components were removed.  The final 36 neutral metal  liner was impacted without complications.  I then placed the 8.8 high  offset Trilock stem to the level of the neck cut and then retrialed, but  was happy with the +5 ball.  The 36 +5 ball was then impacted on to a  clean and dried trunion.  With the hip in extension, there was no  evidence of impingement particularly with external rotation.  There was  1 mm or so of shuck.   With the hip in the flexed position, neutral abduction, there was no  evidence of impingement with the internal rotation to 70-80 degrees.  I  was much more happy with the stability.   Given this, the wound was finally irrigated again.  The posterior  capsule was reapproximated to the superior leaflet using the #1 Ethibond.  The iliotibial band was closed using #1 Ethibond and #1  Vicryl on the  gluteus fascia.  The remainder of the wound was closed in  layers with 2-0 Vicryl and 4-0 running Monocryl.  The hip was clean,  dried, and dressed sterilely with Steri-Strips, dressing, sponges, and  tape.  The patient was  brought to the recovery room in stable condition  and she was not intubated.      Madlyn Frankel Charlann Boxer, M.D.  Electronically Signed     MDO/MEDQ  D:  06/22/2006  T:  06/22/2006  Job:  [id number]

## 2010-10-03 NOTE — Op Note (Signed)
NAMEJULITA, OZBUN NO.:  [id number]   MEDICAL RECORD NO.:  [id number]          PATIENT TYPE:  INP   LOCATION:  1510                         [facility]AC[facility]   PHYSICIAN:  Erasmo Leventhal, M.D.DATE OF BIRTH:  Jun 08, 1930   DATE OF PROCEDURE:  08/13/2005  DATE OF DISCHARGE:                                 OPERATIVE REPORT   PREOPERATIVE DIAGNOSIS:  Right knee end-stage osteoarthritis.   POSTOPERATIVE DIAGNOSIS:  Right knee end-stage osteoarthritis.   PROCEDURE:  Right total knee arthroplasty.   SURGEON:  Erasmo Leventhal, M.D.   ASSISTANT:  Jaquelyn Bitter. Chabon, PA-C.   ANESTHESIA:  Spinal.   ESTIMATED BLOOD LOSS:  Less than 100 cc.   DRAINS:  Two medium Hemovac.   COMPLICATIONS:  None.   DISPOSITION:  PACU stable.   OPERATIVE IMPLANTS:  Laural Benes & [facility] Sigma rotating platform total  knee, size 3 femur, size 3 tibia, a 12.5 mm rotating platform posterior  stabilized tibial insert, and a 32 mm patella, all cemented.   OPERATIVE DETAILS:  Patient is counseled in the holding area.  The correct  side was identified.  An IV was started.  Antibiotics were given.  Taken to  the operating room where a spinal anesthetic was administered.  Foley  catheter was placed utilizing sterile technique by the OR circulating nurse.  All extremities were well padded and bumped, being very careful with her  shoulders and her left lower extremity.  Her lower extremity revealed a 5  degree flexion contracture.  She was elevated, prepped with DuraPrep, and  draped in a sterile fashion.  Exsanguinated with esmarch.  The tourniquet  inflated to 200-300 mmHg.  A straight and midline incision was made through  the skin and subcutaneous tissue.  Medial and lateral soft tissue flaps were  developed at the appropriate level.  A medial parapatellar arthrotomy was  performed.  A proximal medial soft tissue release was done.  The knee was  flexed.  The patella was  retracted out of the way.  End-stage arthritis  changes.  Cruciate ligaments were resected.  A starting hole made in the  distal femur.  Canal was irrigated.  The effluent was cleared.  The  intramedullary rod was gently placed.  I chose a 5 degree valgus cut with an  11 mm cut off the distal femur.  The distal femur was found to be a size 3.  Rotational marks were set.  It was cut to fit a size #3.  Medial and lateral  menisci were removed.  Geniculate vessels were coagulated.  Posterior  neurovascular structures were thawed off and protected throughout the entire  case.  Tibial eminence was resected.  The proximal tibia was found to be a  size 3.  Central aspect was made.  A starting hole was made.  A step reamer  was utilized.  The canal was irrigated.  Effluent was clear.  An  intramedullary rod was gently placed.  I chose a 0 degree slope.  I took a 4  mm cut based upon the defect on the medial side,  which gave [place] a nice  resection.  Posterior medial posterior femoral osteophytes were removed  under direct visualization.  At this time with 10 mm spacers, flexion and  extension gaps were well balanced.  The tibial base plate was applied.  Rotation was set.  Reamer and punch were performed.  [place] was performed.  A  femoral box cut made in the standard fashion.  At this time, a size 3 tibia,  size 3 femur, 10 insert with excellent range of motion and soft tissue  balance.  The patella was found to be a size 32.  The correct amount of bone  was removed.  Locking holes were made.  Patellofemoral tracking was anatomic  with a trial.  The trials were removed.  The knee was irrigated and  pulsatile lavaged.  Utilizing modern cement technique, all components were  cemented into place.  A size 3 tibia, a size 3 femur, 32 patella.  After the  cement had cured, excess cement was removed.  The knee was again irrigated.  We did 10 and 12.5 mm trials.  With the 12.5 mm trial, we had excellent   flexion and extension gaps, stable to varus and valgus stress.  Patellofemoral tracking was anatomic.  Well-balanced knee.  The trial was  removed, again irrigated.  A final 12.5 mm rotating platform, posterior  stabilized tibial insert was implanted.  The knee was checked through an  excellent range of motion, strength, stability, and alignment.  Two medium  Hemovac drains were placed.  Arthrotomy was closed with Vicryl with the knee  at 90 degrees of flexion.  Soft tissue irrigated, subcu Vicryls.  The skin  closed with Monocryl suture.  Two drains were hooked to suction.  A sterile  dressing was applied over the knee.  The tourniquet was deflated.  Normal  circulation of the foot and the ankle at the end of the case.  An ice pack  was applied.  Knee immobilizer in full extension.  She tolerated the  procedure well.  There were no complications.  Sponge and needle counts were  correct.   To help with surgical decision-making and traction, Mr. Brett Canales Chabon's  assistance was needed throughout the entire case.           ______________________________  Erasmo Leventhal, M.D.     RAC/MEDQ  D:  08/13/2005  T:  08/14/2005  Job:  [id number]   cc:   Janae Bridgeman. Eloise Harman., M.D.  Fax: [phone]   Antionette Char, MD  Fax: [phone]

## 2011-02-06 LAB — BASIC METABOLIC PANEL
Chloride: 106
GFR calc Af Amer: 47 — ABNORMAL LOW
Potassium: 4.5

## 2011-02-06 LAB — URINALYSIS, ROUTINE W REFLEX MICROSCOPIC
Glucose, UA: NEGATIVE
Hgb urine dipstick: NEGATIVE
Specific Gravity, Urine: 1.022
Urobilinogen, UA: 0.2

## 2011-02-06 LAB — DIFFERENTIAL
Eosinophils Absolute: 0.1
Eosinophils Relative: 2
Lymphs Abs: 1.6
Monocytes Relative: 10

## 2011-02-06 LAB — CBC
HCT: 34.8 — ABNORMAL LOW
Hemoglobin: 11.9 — ABNORMAL LOW
MCV: 95.9
RBC: 3.63 — ABNORMAL LOW
WBC: 6.2

## 2011-02-06 LAB — PROTIME-INR: INR: 1

## 2011-02-09 LAB — BASIC METABOLIC PANEL
Calcium: 8.2 — ABNORMAL LOW
Creatinine, Ser: 1.02
GFR calc Af Amer: >60
GFR calc non Af Amer: 53 — ABNORMAL LOW
GFR calc non Af Amer: 55 — ABNORMAL LOW
Glucose, Bld: 115 — ABNORMAL HIGH
Glucose, Bld: 133 — ABNORMAL HIGH
Potassium: 4.3
Sodium: 138
Sodium: 139

## 2011-02-09 LAB — CBC
HCT: 28.1 — ABNORMAL LOW
Hemoglobin: 10.3 — ABNORMAL LOW
Hemoglobin: 9.8 — ABNORMAL LOW
Platelets: 136 — ABNORMAL LOW
RBC: 2.98 — ABNORMAL LOW
RDW: 12.2
WBC: 9.2

## 2011-02-09 LAB — TYPE AND SCREEN
ABO/RH(D): O POS
Antibody Screen: NEGATIVE

## 2011-06-03 DIAGNOSIS — Z1231 Encounter for screening mammogram for malignant neoplasm of breast: Secondary | ICD-10-CM | POA: Diagnosis not present

## 2011-10-27 ENCOUNTER — Encounter (HOSPITAL_BASED_OUTPATIENT_CLINIC_OR_DEPARTMENT_OTHER): Payer: Self-pay | Admitting: *Deleted

## 2011-10-27 ENCOUNTER — Emergency Department (HOSPITAL_BASED_OUTPATIENT_CLINIC_OR_DEPARTMENT_OTHER): Payer: Medicare Other

## 2011-10-27 ENCOUNTER — Inpatient Hospital Stay (HOSPITAL_BASED_OUTPATIENT_CLINIC_OR_DEPARTMENT_OTHER)
Admission: EM | Admit: 2011-10-27 | Discharge: 2011-11-02 | DRG: 418 | Disposition: A | Discharge status: Home / Self Care | Payer: Medicare Other | Source: Ambulatory Visit | Attending: Internal Medicine | Admitting: Internal Medicine

## 2011-10-27 DIAGNOSIS — I10 Essential (primary) hypertension: Secondary | ICD-10-CM | POA: Diagnosis not present

## 2011-10-27 DIAGNOSIS — Z79899 Other long term (current) drug therapy: Secondary | ICD-10-CM

## 2011-10-27 DIAGNOSIS — Z880 Allergy status to penicillin: Secondary | ICD-10-CM

## 2011-10-27 DIAGNOSIS — E785 Hyperlipidemia, unspecified: Secondary | ICD-10-CM | POA: Diagnosis present

## 2011-10-27 DIAGNOSIS — K859 Acute pancreatitis without necrosis or infection, unspecified: Secondary | ICD-10-CM | POA: Diagnosis not present

## 2011-10-27 DIAGNOSIS — N390 Urinary tract infection, site not specified: Secondary | ICD-10-CM | POA: Diagnosis not present

## 2011-10-27 DIAGNOSIS — R1011 Right upper quadrant pain: Secondary | ICD-10-CM | POA: Diagnosis not present

## 2011-10-27 DIAGNOSIS — K8020 Calculus of gallbladder without cholecystitis without obstruction: Secondary | ICD-10-CM

## 2011-10-27 DIAGNOSIS — Z882 Allergy status to sulfonamides status: Secondary | ICD-10-CM

## 2011-10-27 DIAGNOSIS — K5900 Constipation, unspecified: Secondary | ICD-10-CM | POA: Diagnosis not present

## 2011-10-27 DIAGNOSIS — K8051 Calculus of bile duct without cholangitis or cholecystitis with obstruction: Secondary | ICD-10-CM

## 2011-10-27 DIAGNOSIS — Z96649 Presence of unspecified artificial hip joint: Secondary | ICD-10-CM

## 2011-10-27 DIAGNOSIS — K8071 Calculus of gallbladder and bile duct without cholecystitis with obstruction: Secondary | ICD-10-CM | POA: Diagnosis present

## 2011-10-27 DIAGNOSIS — E7800 Pure hypercholesterolemia, unspecified: Secondary | ICD-10-CM

## 2011-10-27 DIAGNOSIS — I739 Peripheral vascular disease, unspecified: Secondary | ICD-10-CM

## 2011-10-27 DIAGNOSIS — B964 Proteus (mirabilis) (morganii) as the cause of diseases classified elsewhere: Secondary | ICD-10-CM | POA: Diagnosis present

## 2011-10-27 DIAGNOSIS — Z7982 Long term (current) use of aspirin: Secondary | ICD-10-CM

## 2011-10-27 DIAGNOSIS — Z96659 Presence of unspecified artificial knee joint: Secondary | ICD-10-CM

## 2011-10-27 HISTORY — DX: Peripheral vascular disease, unspecified: I73.9

## 2011-10-27 HISTORY — DX: Unspecified osteoarthritis, unspecified site: M19.90

## 2011-10-27 HISTORY — DX: Essential (primary) hypertension: I10

## 2011-10-27 HISTORY — DX: Pure hypercholesterolemia, unspecified: E78.00

## 2011-10-27 LAB — URINALYSIS, ROUTINE W REFLEX MICROSCOPIC
Bilirubin Urine: NEGATIVE
Glucose, UA: NEGATIVE mg/dL
Specific Gravity, Urine: 1.018 (ref 1.005–1.030)
pH: 8 (ref 5.0–8.0)

## 2011-10-27 LAB — COMPREHENSIVE METABOLIC PANEL
Albumin: 3.9 g/dL (ref 3.5–5.2)
Alkaline Phosphatase: 78 U/L (ref 39–117)
BUN: 22 mg/dL (ref 6–23)
Calcium: 9.6 mg/dL (ref 8.4–10.5)
Creatinine, Ser: 1.3 mg/dL — ABNORMAL HIGH (ref 0.50–1.10)
GFR calc Af Amer: 44 mL/min — ABNORMAL LOW (ref >90)
Glucose, Bld: 121 mg/dL — ABNORMAL HIGH (ref 70–99)
Potassium: 4.1 mEq/L (ref 3.5–5.1)
Total Protein: 6.7 g/dL (ref 6.0–8.3)

## 2011-10-27 LAB — LIPASE, BLOOD: Lipase: 1984 U/L — ABNORMAL HIGH (ref 11–59)

## 2011-10-27 LAB — URINE MICROSCOPIC-ADD ON

## 2011-10-27 LAB — DIFFERENTIAL
Basophils Relative: 0 % (ref 0–1)
Eosinophils Absolute: 0.1 10*3/uL (ref 0.0–0.7)
Eosinophils Relative: 2 % (ref 0–5)
Lymphs Abs: 1.8 10*3/uL (ref 0.7–4.0)
Monocytes Absolute: 0.7 10*3/uL (ref 0.1–1.0)
Monocytes Relative: 7 % (ref 3–12)

## 2011-10-27 LAB — CBC
HCT: 37.5 % (ref 36.0–46.0)
Hemoglobin: 13 g/dL (ref 12.0–15.0)
MCH: 33 pg (ref 26.0–34.0)
MCHC: 34.7 g/dL (ref 30.0–36.0)
MCV: 95.2 fL (ref 78.0–100.0)
RBC: 3.94 MIL/uL (ref 3.87–5.11)

## 2011-10-27 IMAGING — US US ABDOMEN COMPLETE
1 series · 14 of 25 positions shown · non-contrast
Comparison: None.

CLINICAL DATA: Right upper quadrant pain.  Hypertension and high
cholesterol.

COMPLETE ABDOMINAL ULTRASOUND

[Series 1: us abdomen complete · 0.28mm/px · 14 of 77 slices shown]
[im 1/77]
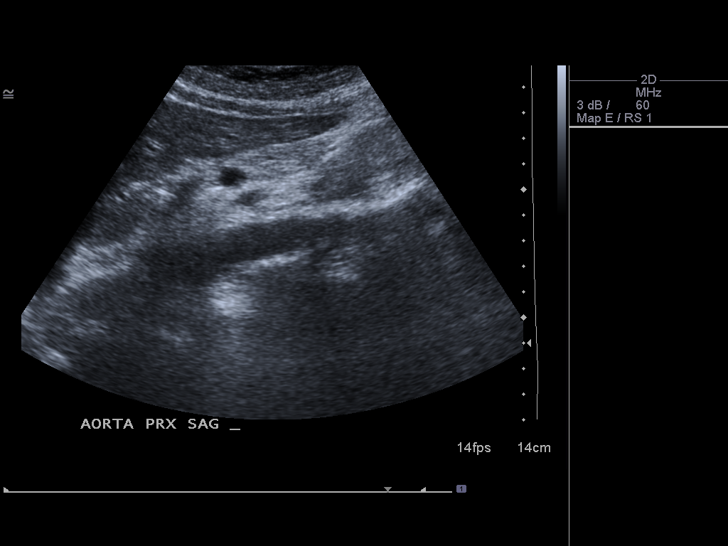
[im 7/77]
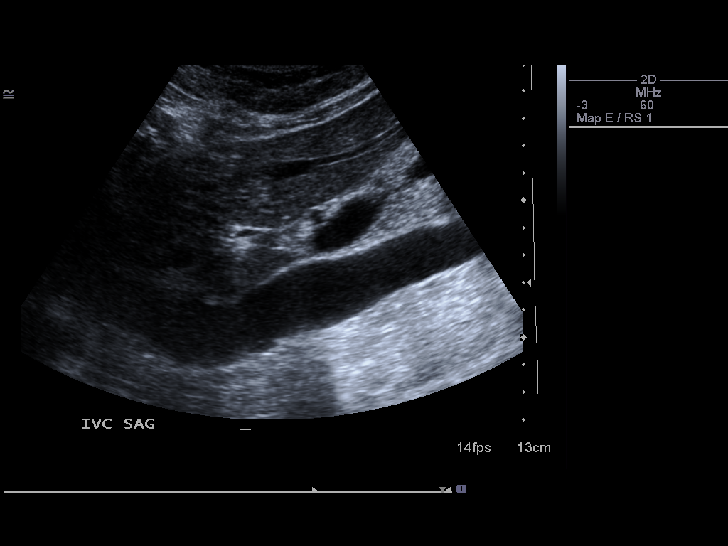
[im 13/77]
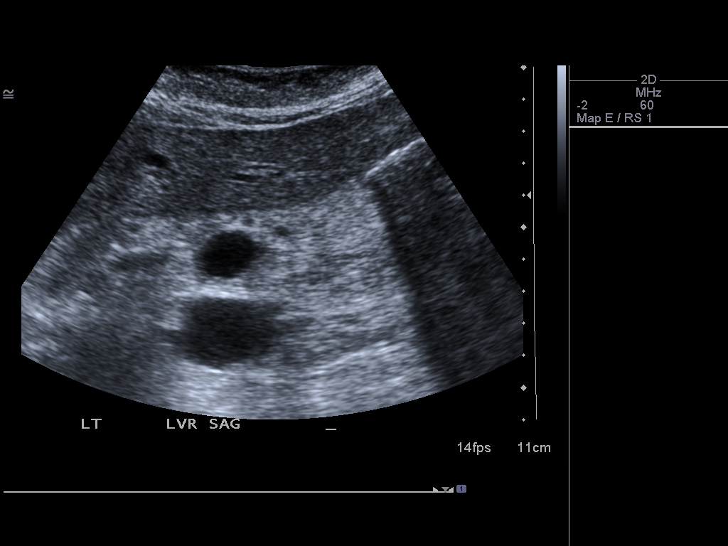
[im 20/77]
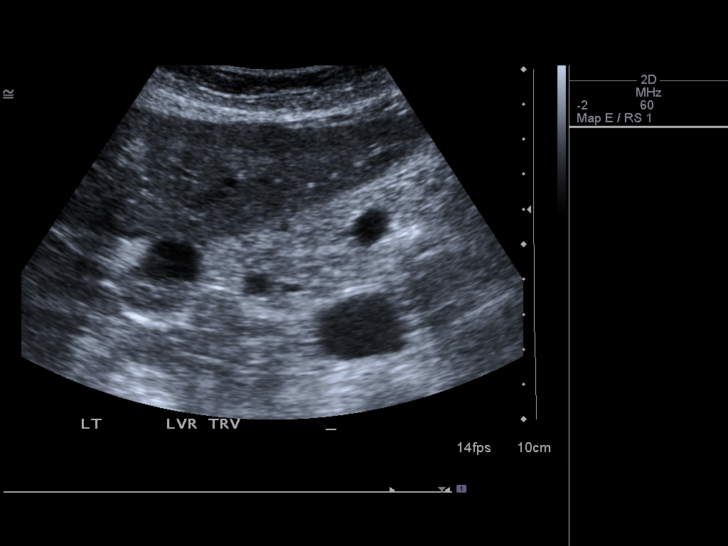
[im 26/77]
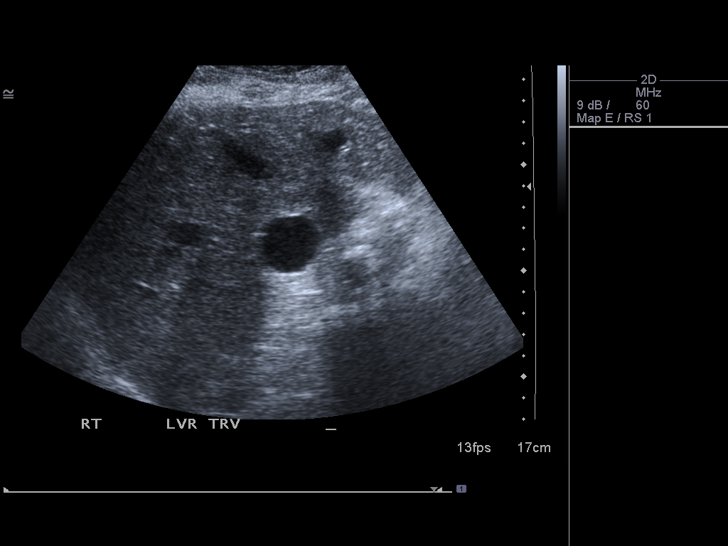
[im 29/77]
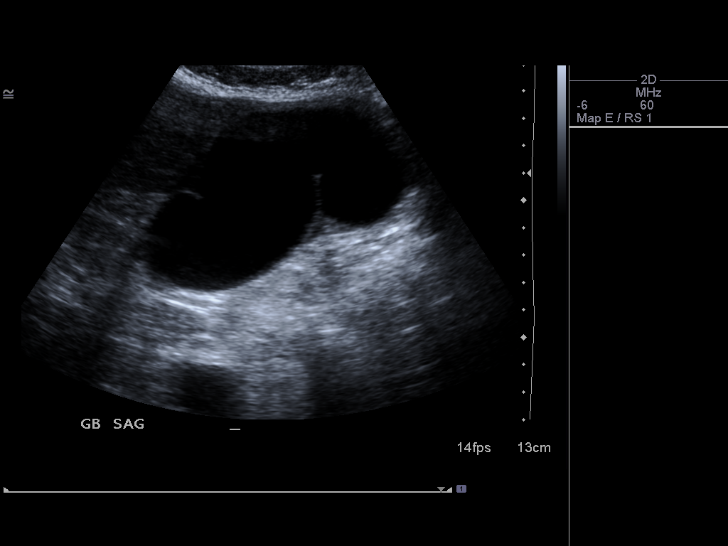
[im 35/77]
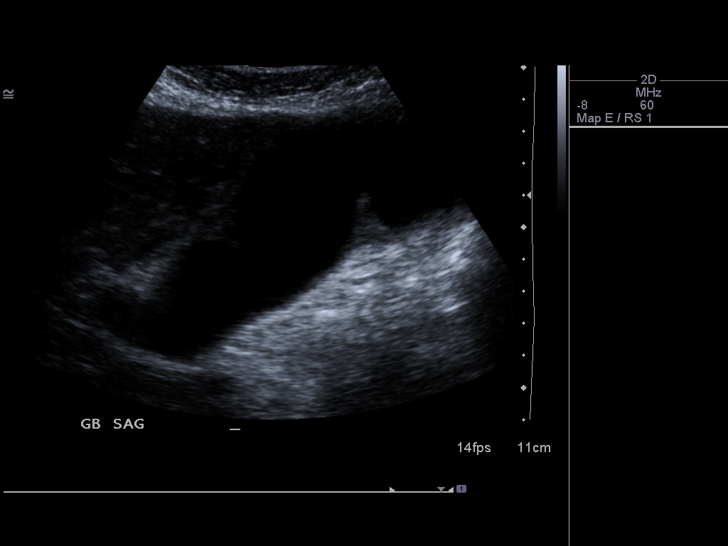
[im 42/77]
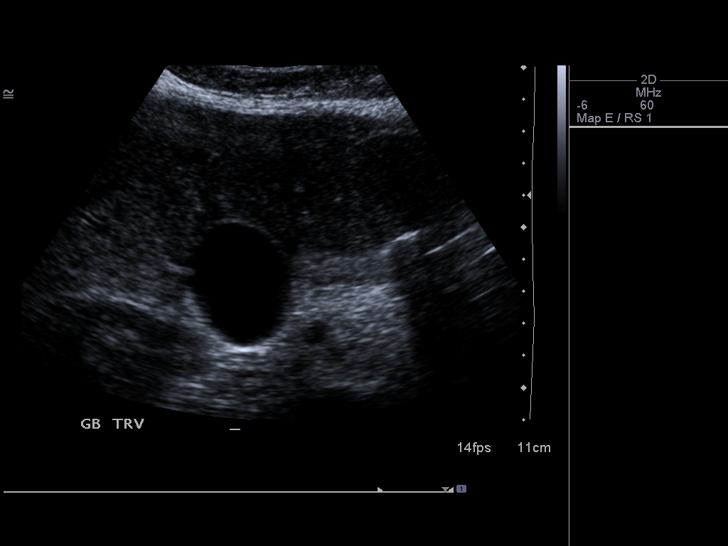
[im 48/77]
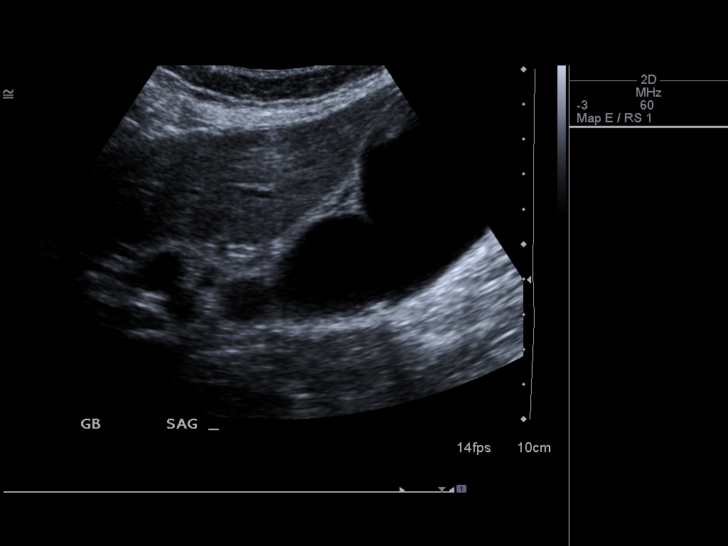
[im 51/77]
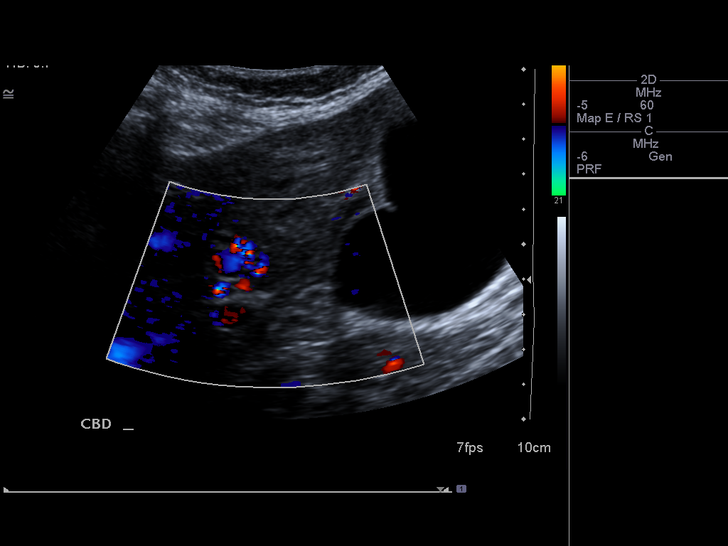
[im 58/77]
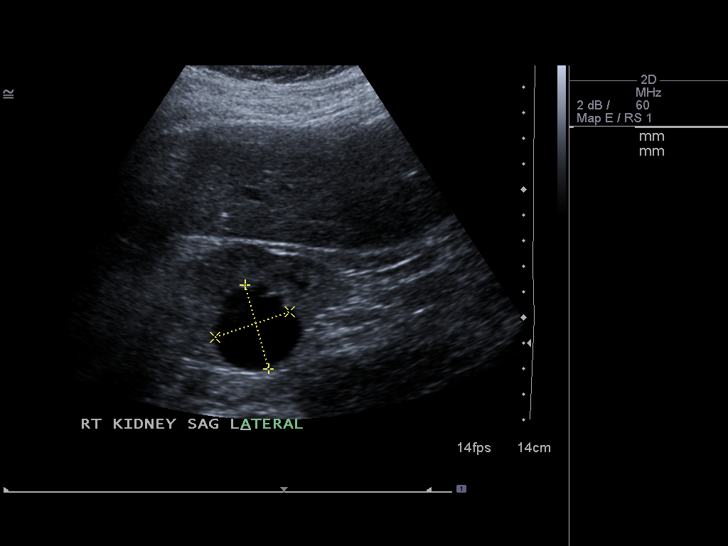
[im 64/77]
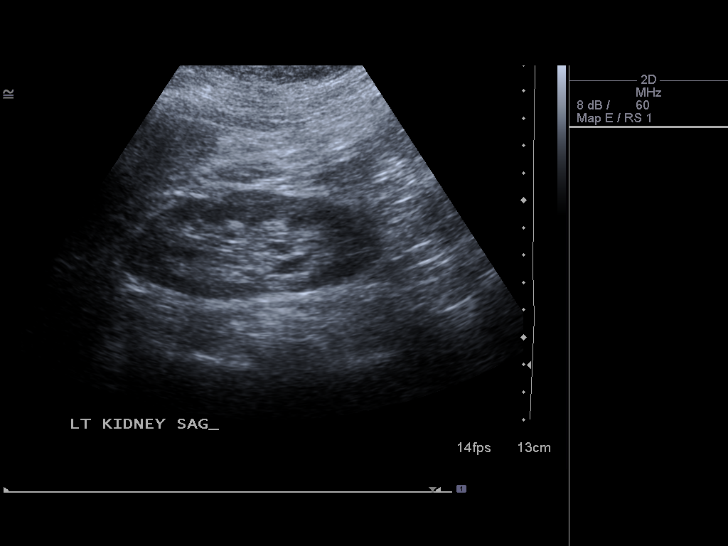
[im 70/77]
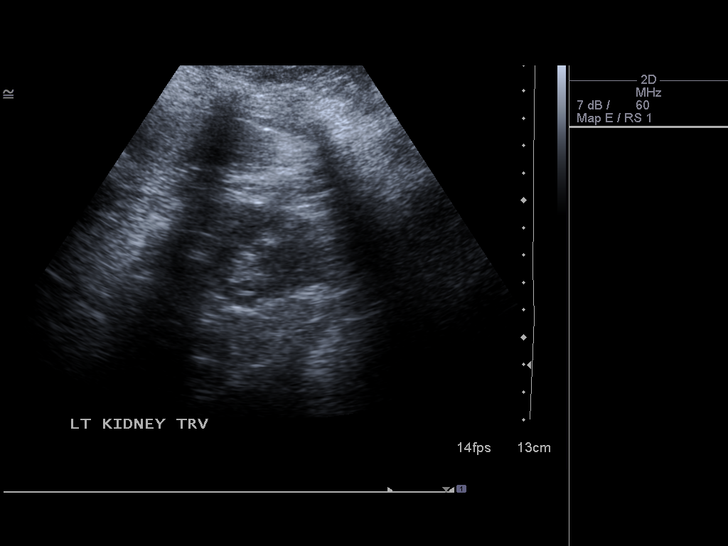
[im 77/77]
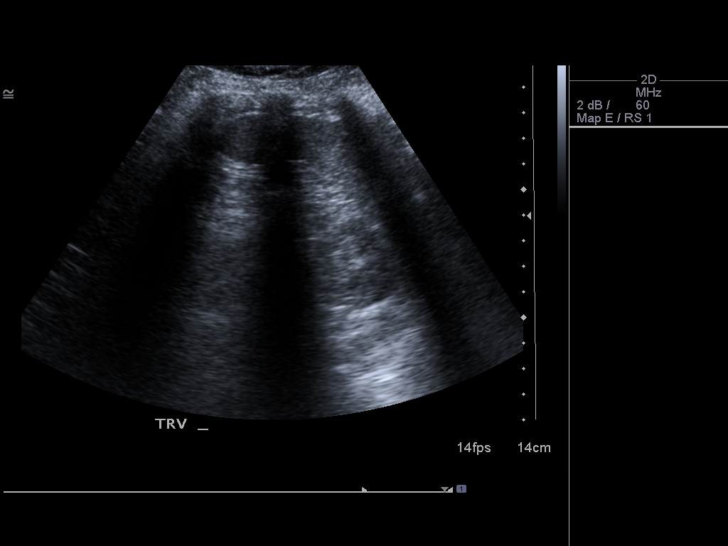

[14 of 25 positions shown; findings below may reference images not displayed]

FINDINGS: Gallbladder:  The gallbladder is distended.  There is sludge and
probable small stones within.  No wall thickening or
pericholecystic fluid. Sonographic Murphy's sign was not elicited.

Common bile duct: Normal, 2 mm.

Liver: Negative

IVC: Poorly visualized due to overlying bowel gas.

Pancreas:  Poorly visualized due to overlying bowel gas.

Spleen:  Normal in size and echogenicity.

Right Kidney:  11.0 cm.  Probable minimally complex cyst in the
interpolar right kidney.  3.4 cm.

Left Kidney:  9.4 cm. No hydronephrosis.  Renal cortical thinning
is mild.

Abdominal aorta:  Partially obscured by bowel gas.  No aneurysm or
ascites.
IMPRESSION: 1.  Gallbladder sludge and probable stones.  Marked gallbladder
distention, without specific evidence of acute cholecystitis.
2.  No other explanation for pain.
3.  Portions of anatomy obscured by overlying bowel gas.

## 2011-10-27 MED ORDER — ONDANSETRON HCL 4 MG/2ML IJ SOLN
4.0000 mg | Freq: Once | INTRAMUSCULAR | Status: AC
  Administered 2011-10-27: 4 mg via INTRAVENOUS
  Filled 2011-10-27: qty 2

## 2011-10-27 MED ORDER — MORPHINE SULFATE 4 MG/ML IJ SOLN
4.0000 mg | Freq: Once | INTRAMUSCULAR | Status: AC
  Administered 2011-10-27: 4 mg via INTRAVENOUS
  Filled 2011-10-27: qty 1

## 2011-10-27 MED ORDER — MORPHINE SULFATE 4 MG/ML IJ SOLN
4.0000 mg | Freq: Once | INTRAMUSCULAR | Status: AC
  Administered 2011-10-28: 4 mg via INTRAVENOUS
  Filled 2011-10-27: qty 1

## 2011-10-27 MED ORDER — SODIUM CHLORIDE 0.9 % IV BOLUS (SEPSIS)
1000.0000 mL | Freq: Once | INTRAVENOUS | Status: AC
  Administered 2011-10-28: 1000 mL via INTRAVENOUS

## 2011-10-27 MED ORDER — DEXTROSE 5 % IV SOLN
1.0000 g | Freq: Once | INTRAVENOUS | Status: AC
  Administered 2011-10-28: 1 g via INTRAVENOUS
  Filled 2011-10-27: qty 10

## 2011-10-27 NOTE — ED Notes (Signed)
Right upper quad pain since after lunch today.

## 2011-10-27 NOTE — ED Notes (Signed)
Pt c/o right quad pain

## 2011-10-27 NOTE — ED Provider Notes (Signed)
History     CSN: [id number]  Arrival date & time 10/27/11  2104   First MD Initiated Contact with Patient 10/27/11 2149      Chief Complaint  Patient presents with  . Abdominal Pain    (Consider location/radiation/quality/duration/timing/severity/associated sxs/prior treatment) HPI Patient is a 78 yo female who presents today complaining of RUQ pain that was acutely worse after eating lunch today at church.  She denies nausea, vomiting, constipation, fevers, or diarrhea.  Patient has no known sick contacts.  She does endorse urinary hesitancy and frequency for several weeks.  There are no alleviating or exacerbating factors.  Pain is described as an ache.  Patient has history of partial abdominal hysterectomy and no other abdominal surgical procedures. Past Medical History  Diagnosis Date  . Hypertension   . PAD (peripheral artery disease)   . High cholesterol   . Arthritis     Past Surgical History  Procedure Date  . Total hip arthroplasty   . Total knee arthroplasty     History reviewed. No pertinent family history.  History  Substance Use Topics  . Smoking status: Never Smoker   . Smokeless tobacco: Not on file  . Alcohol Use: No    OB History    Grav Para Term Preterm Abortions TAB SAB Ect Mult Living                  Review of Systems  Constitutional: Positive for fatigue.  HENT: Negative.   Eyes: Negative.   Respiratory: Negative.   Cardiovascular: Negative.   Gastrointestinal: Positive for abdominal pain.  Genitourinary: Positive for frequency and difficulty urinating.  Musculoskeletal: Negative.   Neurological: Negative.   Hematological: Negative.   Psychiatric/Behavioral: Negative.   All other systems reviewed and are negative.    Allergies  Penicillins and Sulfa antibiotics  Home Medications   Current Outpatient Rx  Name Route Sig Dispense Refill  . AMLODIPINE BESYLATE-VALSARTAN 5-320 MG PO TABS Oral Take 1 tablet by mouth daily.    .  ASPIRIN 81 MG PO TABS Oral Take 81 mg by mouth daily.    . ATENOLOL 50 MG PO TABS Oral Take 50 mg by mouth daily.    Marland Kitchen VITAMIN D 1000 UNITS PO TABS Oral Take 1,000 Units by mouth daily.    Marland Kitchen DOXAZOSIN MESYLATE 8 MG PO TABS Oral Take 8 mg by mouth at bedtime.    Marland Kitchen ROSUVASTATIN CALCIUM 5 MG PO TABS Oral Take 5 mg by mouth daily.      BP 166/63  Pulse 55  Temp(Src) 97.8 F (36.6 C) (Oral)  Resp 20  SpO2 100%  Physical Exam  Nursing note and vitals reviewed. GEN: Well-developed, well-nourished female in no distress HEENT: Atraumatic, normocephalic.  EYES: PERRLA BL, no scleral icterus. NECK: Trachea midline, no meningismus CV: regular rate and rhythm. No murmurs, rubs, or gallops PULM: No respiratory distress.  No crackles, wheezes, or rales. GI: soft, RUQ TTP. No guarding, rebound. + bowel sounds  GU: deferred Neuro: cranial nerves 2-12 intact, no abnormalities of strength or sensation, A and O x 3 MSK: Patient moves all 4 extremities symmetrically, no deformity, edema, or injury noted Skin: No rashes petechiae, purpura, or jaundice Psych: no abnormality of mood   ED Course  Procedures (including critical care time)  Labs Reviewed  URINALYSIS, ROUTINE W REFLEX MICROSCOPIC - Abnormal; Notable for the following:    APPearance TURBID (*)    Hgb urine dipstick SMALL (*)    Protein, ur  30 (*)    Nitrite POSITIVE (*)    Leukocytes, UA LARGE (*)    All other components within normal limits  COMPREHENSIVE METABOLIC PANEL - Abnormal; Notable for the following:    Glucose, Bld 121 (*)    Creatinine, Ser 1.30 (*)    AST 52 (*)    GFR calc non Af Amer 38 (*)    GFR calc Af Amer 44 (*)    All other components within normal limits  LIPASE, BLOOD - Abnormal; Notable for the following:    Lipase 1984 (*)    All other components within normal limits  URINE MICROSCOPIC-ADD ON - Abnormal; Notable for the following:    Squamous Epithelial / LPF FEW (*)    Bacteria, UA MANY (*)    All  other components within normal limits  CBC  DIFFERENTIAL  URINE CULTURE   US Abdomen Complete  10/27/2011  *RADIOLOGY REPORT*  Clinical Data:  Right upper quadrant pain.  Hypertension and high cholesterol.  COMPLETE ABDOMINAL ULTRASOUND  Comparison:  None.  Findings:  Gallbladder:  The gallbladder is distended.  There is sludge and probable small stones within.  No wall thickening or pericholecystic fluid. Sonographic Murphy's sign was not elicited.  Common bile duct: Normal, 2 mm.  Liver: Negative  IVC: Poorly visualized due to overlying bowel gas.  Pancreas:  Poorly visualized due to overlying bowel gas.  Spleen:  Normal in size and echogenicity.  Right Kidney:  11.0 cm.  Probable minimally complex cyst in the interpolar right kidney.  3.4 cm.  Left Kidney:  9.4 cm. No hydronephrosis.  Renal cortical thinning is mild.  Abdominal aorta:  Partially obscured by bowel gas.  No aneurysm or ascites.  IMPRESSION:  1.  Gallbladder sludge and probable stones.  Marked gallbladder distention, without specific evidence of acute cholecystitis. 2.  No other explanation for pain. 3.  Portions of anatomy obscured by overlying bowel gas.  Original Report Authenticated By: Consuello Bossier, M.D.     1. Pancreatitis   2. Cholelithiasis   3. UTI (urinary tract infection)       MDM  Patient was evaluated by myself.  Based on presentation I had high suspicion for biliary colic and abdominal labs as well as abdominal US were performed.  Patient had urinalysis representative of a UTI and culture was sent.  Rocephin IV was given for this.  CBC showed no leukocytosis and patient was hemodynamically stable. BUN/Cr ratio was elevated.  Patient was treated with morphine and zofran as well as IVF.  Patient did have lipase of 1984 with no history of pancreatitis.  AST was 52 with no other abnormalities on liver function testing.  Patient did have [place] which showed cholelithiasis without cholecystitis.  Patient will be admitted for  further management of her pancreatitis and UTI. Call was placed to the hospitalist.  Patient admitted to hospitalist.        Cyndra Numbers, MD 10/28/11 [phone]

## 2011-10-28 ENCOUNTER — Encounter (HOSPITAL_COMMUNITY): Payer: Self-pay | Admitting: Internal Medicine

## 2011-10-28 DIAGNOSIS — E782 Mixed hyperlipidemia: Secondary | ICD-10-CM | POA: Diagnosis not present

## 2011-10-28 DIAGNOSIS — K8020 Calculus of gallbladder without cholecystitis without obstruction: Secondary | ICD-10-CM | POA: Diagnosis present

## 2011-10-28 DIAGNOSIS — K859 Acute pancreatitis without necrosis or infection, unspecified: Secondary | ICD-10-CM | POA: Diagnosis not present

## 2011-10-28 DIAGNOSIS — I739 Peripheral vascular disease, unspecified: Secondary | ICD-10-CM | POA: Diagnosis present

## 2011-10-28 DIAGNOSIS — K8071 Calculus of gallbladder and bile duct without cholecystitis with obstruction: Secondary | ICD-10-CM | POA: Diagnosis present

## 2011-10-28 DIAGNOSIS — I10 Essential (primary) hypertension: Secondary | ICD-10-CM | POA: Diagnosis present

## 2011-10-28 DIAGNOSIS — R932 Abnormal findings on diagnostic imaging of liver and biliary tract: Secondary | ICD-10-CM | POA: Diagnosis not present

## 2011-10-28 DIAGNOSIS — E7800 Pure hypercholesterolemia, unspecified: Secondary | ICD-10-CM | POA: Diagnosis present

## 2011-10-28 DIAGNOSIS — K8050 Calculus of bile duct without cholangitis or cholecystitis without obstruction: Secondary | ICD-10-CM | POA: Diagnosis not present

## 2011-10-28 DIAGNOSIS — Z79899 Other long term (current) drug therapy: Secondary | ICD-10-CM | POA: Diagnosis not present

## 2011-10-28 DIAGNOSIS — E785 Hyperlipidemia, unspecified: Secondary | ICD-10-CM | POA: Diagnosis present

## 2011-10-28 DIAGNOSIS — K5900 Constipation, unspecified: Secondary | ICD-10-CM | POA: Diagnosis not present

## 2011-10-28 DIAGNOSIS — Z880 Allergy status to penicillin: Secondary | ICD-10-CM | POA: Diagnosis not present

## 2011-10-28 DIAGNOSIS — R1011 Right upper quadrant pain: Secondary | ICD-10-CM | POA: Diagnosis not present

## 2011-10-28 DIAGNOSIS — Z7982 Long term (current) use of aspirin: Secondary | ICD-10-CM | POA: Diagnosis not present

## 2011-10-28 DIAGNOSIS — B964 Proteus (mirabilis) (morganii) as the cause of diseases classified elsewhere: Secondary | ICD-10-CM | POA: Diagnosis present

## 2011-10-28 DIAGNOSIS — R7402 Elevation of levels of lactic acid dehydrogenase (LDH): Secondary | ICD-10-CM | POA: Diagnosis not present

## 2011-10-28 DIAGNOSIS — Z96649 Presence of unspecified artificial hip joint: Secondary | ICD-10-CM | POA: Diagnosis not present

## 2011-10-28 DIAGNOSIS — I359 Nonrheumatic aortic valve disorder, unspecified: Secondary | ICD-10-CM | POA: Diagnosis not present

## 2011-10-28 DIAGNOSIS — K8010 Calculus of gallbladder with chronic cholecystitis without obstruction: Secondary | ICD-10-CM | POA: Diagnosis not present

## 2011-10-28 DIAGNOSIS — Z882 Allergy status to sulfonamides status: Secondary | ICD-10-CM | POA: Diagnosis not present

## 2011-10-28 DIAGNOSIS — R109 Unspecified abdominal pain: Secondary | ICD-10-CM | POA: Diagnosis not present

## 2011-10-28 DIAGNOSIS — N390 Urinary tract infection, site not specified: Secondary | ICD-10-CM | POA: Diagnosis present

## 2011-10-28 DIAGNOSIS — Z96659 Presence of unspecified artificial knee joint: Secondary | ICD-10-CM | POA: Diagnosis not present

## 2011-10-28 LAB — BASIC METABOLIC PANEL
CO2: 22 mEq/L (ref 19–32)
Chloride: 106 mEq/L (ref 96–112)
Creatinine, Ser: 1.04 mg/dL (ref 0.50–1.10)
GFR calc Af Amer: 58 mL/min — ABNORMAL LOW (ref >90)
Potassium: 3.7 mEq/L (ref 3.5–5.1)
Sodium: 138 mEq/L (ref 135–145)

## 2011-10-28 LAB — CBC
MCV: 94.5 fL (ref 78.0–100.0)
Platelets: 162 10*3/uL (ref 150–400)
RBC: 3.47 MIL/uL — ABNORMAL LOW (ref 3.87–5.11)
RDW: 12.3 % (ref 11.5–15.5)
WBC: 9.5 10*3/uL (ref 4.0–10.5)

## 2011-10-28 MED ORDER — HYDROMORPHONE HCL PF 1 MG/ML IJ SOLN
1.0000 mg | INTRAMUSCULAR | Status: DC | PRN
  Administered 2011-10-28 – 2011-11-02 (×5): 1 mg via INTRAVENOUS
  Filled 2011-10-28 (×5): qty 1

## 2011-10-28 MED ORDER — DOXAZOSIN MESYLATE 8 MG PO TABS
8.0000 mg | ORAL_TABLET | Freq: Every day | ORAL | Status: DC
  Administered 2011-10-28 – 2011-11-01 (×5): 8 mg via ORAL
  Filled 2011-10-28 (×6): qty 1

## 2011-10-28 MED ORDER — ATORVASTATIN CALCIUM 10 MG PO TABS
10.0000 mg | ORAL_TABLET | Freq: Every day | ORAL | Status: DC
  Administered 2011-10-28 – 2011-11-01 (×5): 10 mg via ORAL
  Filled 2011-10-28 (×6): qty 1

## 2011-10-28 MED ORDER — IRBESARTAN 300 MG PO TABS
300.0000 mg | ORAL_TABLET | Freq: Every day | ORAL | Status: DC
  Administered 2011-10-28 – 2011-11-02 (×6): 300 mg via ORAL
  Filled 2011-10-28 (×7): qty 1

## 2011-10-28 MED ORDER — ASPIRIN 81 MG PO CHEW
81.0000 mg | CHEWABLE_TABLET | Freq: Every day | ORAL | Status: DC
  Administered 2011-10-28 – 2011-10-29 (×2): 81 mg via ORAL
  Filled 2011-10-28 (×3): qty 1

## 2011-10-28 MED ORDER — ONDANSETRON HCL 4 MG/2ML IJ SOLN: 4.0000 mg | Freq: Four times a day (QID) | INTRAMUSCULAR | Status: DC | PRN

## 2011-10-28 MED ORDER — ATENOLOL 50 MG PO TABS
50.0000 mg | ORAL_TABLET | Freq: Every day | ORAL | Status: DC
  Administered 2011-10-28 – 2011-11-02 (×5): 50 mg via ORAL
  Filled 2011-10-28 (×6): qty 1

## 2011-10-28 MED ORDER — ONDANSETRON HCL 4 MG/2ML IJ SOLN
INTRAMUSCULAR | Status: AC
  Administered 2011-10-28: 02:00:00
  Filled 2011-10-28: qty 2

## 2011-10-28 MED ORDER — KCL IN DEXTROSE-NACL 20-5-0.9 MEQ/L-%-% IV SOLN
INTRAVENOUS | Status: DC
  Administered 2011-10-28 – 2011-10-29 (×3): via INTRAVENOUS
  Filled 2011-10-28 (×4): qty 1000

## 2011-10-28 MED ORDER — ASPIRIN 81 MG PO TABS: 81.0000 mg | ORAL_TABLET | Freq: Every day | ORAL | Status: DC

## 2011-10-28 MED ORDER — ENOXAPARIN SODIUM 40 MG/0.4ML ~~LOC~~ SOLN
40.0000 mg | SUBCUTANEOUS | Status: DC
Start: 2011-10-28 — End: 2011-10-28
  Filled 2011-10-28: qty 0.4

## 2011-10-28 MED ORDER — AMLODIPINE BESYLATE 5 MG PO TABS
5.0000 mg | ORAL_TABLET | Freq: Every day | ORAL | Status: DC
  Administered 2011-10-28 – 2011-11-02 (×6): 5 mg via ORAL
  Filled 2011-10-28 (×6): qty 1

## 2011-10-28 MED ORDER — VITAMIN D3 25 MCG (1000 UNIT) PO TABS
1000.0000 [IU] | ORAL_TABLET | Freq: Every day | ORAL | Status: DC
  Administered 2011-10-28 – 2011-11-02 (×6): 1000 [IU] via ORAL
  Filled 2011-10-28 (×6): qty 1

## 2011-10-28 MED ORDER — ONDANSETRON HCL 4 MG PO TABS: 4.0000 mg | ORAL_TABLET | Freq: Four times a day (QID) | ORAL | Status: DC | PRN

## 2011-10-28 MED ORDER — AMLODIPINE BESYLATE-VALSARTAN 5-320 MG PO TABS: 1.0000 | ORAL_TABLET | Freq: Every day | ORAL | Status: DC

## 2011-10-28 MED ORDER — DEXTROSE 5 % IV SOLN
1.0000 g | INTRAVENOUS | Status: DC
  Administered 2011-10-28 – 2011-11-01 (×5): 1 g via INTRAVENOUS
  Filled 2011-10-28 (×6): qty 10

## 2011-10-28 NOTE — Consult Note (Signed)
Check amylase and lipase in AM. Symptoms improving.  Will plan lap chole once pancreatitis improves.  I spoke at length with patient and her husband regarding the plan of care. Patient examined and I agree with the assessment and plan  Violeta Gelinas, MD, MPH, FACS Pager: [phone]  10/28/2011 5:51 PM

## 2011-10-28 NOTE — Consult Note (Signed)
Reason for Consult:Abdominal pain, pancreatitis Referring Physician: Dr. Farley [name] is an 78 y.o. female.  HPI: Pt is a 78 yr old female with 24 hour history of abdominal pain.  She presented to the [facility] for evaluation.  In the ER she developed nausea and had dry heaves.  She has had a similar episode several weeks ago that resolved very quickly.  Her evaluation showed that she had pancreatitis with lipase of 1984.  Ultrasound showed gallbladder distention with sludge and probable small stones but no evidence of cholecystitis.  She was admitted and we are asked to see her for possible gallstone pancreatitis.  Past Medical History  Diagnosis Date  . Hypertension   . PAD (peripheral artery disease)   . High cholesterol   . Arthritis     Past Surgical History  Procedure Date  . Total hip arthroplasty   . Total knee arthroplasty     History reviewed. No pertinent family history.  Social History:  reports that she has never smoked. She does not have any smokeless tobacco history on file. She reports that she does not drink alcohol or use illicit drugs.  Allergies:  Allergies  Allergen Reactions  . Penicillins   . Sulfa Antibiotics     Medications: I have reviewed the patient's current medications.  Results for orders placed during the hospital encounter of 10/27/11 (from the past 48 hour(s))  CBC     Status: Normal   Collection Time   10/27/11 10:03 PM      Component Value Range Comment   WBC 9.3  4.0 - 10.5 K/uL    RBC 3.94  3.87 - 5.11 MIL/uL    Hemoglobin 13.0  12.0 - 15.0 g/dL    HCT 19.1  47.8 - 29.5 %    MCV 95.2  78.0 - 100.0 fL    MCH 33.0  26.0 - 34.0 pg    MCHC 34.7  30.0 - 36.0 g/dL    RDW 62.1  30.8 - 65.7 %    Platelets 197  150 - 400 K/uL   DIFFERENTIAL     Status: Normal   Collection Time   10/27/11 10:03 PM      Component Value Range Comment   Neutrophils Relative 72  43 - 77 %    Neutro Abs 6.7  1.7 - 7.7 K/uL    Lymphocytes Relative 19  12 -  46 %    Lymphs Abs 1.8  0.7 - 4.0 K/uL    Monocytes Relative 7  3 - 12 %    Monocytes Absolute 0.7  0.1 - 1.0 K/uL    Eosinophils Relative 2  0 - 5 %    Eosinophils Absolute 0.1  0.0 - 0.7 K/uL    Basophils Relative 0  0 - 1 %    Basophils Absolute 0.0  0.0 - 0.1 K/uL   COMPREHENSIVE METABOLIC PANEL     Status: Abnormal   Collection Time   10/27/11 10:03 PM      Component Value Range Comment   Sodium 137  135 - 145 mEq/L    Potassium 4.1  3.5 - 5.1 mEq/L    Chloride 102  96 - 112 mEq/L    CO2 27  19 - 32 mEq/L    Glucose, Bld 121 (*) 70 - 99 mg/dL    BUN 22  6 - 23 mg/dL    Creatinine, Ser 8.46 (*) 0.50 - 1.10 mg/dL    Calcium 9.6  8.4 - 10.5 mg/dL    Total Protein 6.7  6.0 - 8.3 g/dL    Albumin 3.9  3.5 - 5.2 g/dL    AST 52 (*) 0 - 37 U/L    ALT 25  0 - 35 U/L    Alkaline Phosphatase 78  39 - 117 U/L    Total Bilirubin 0.9  0.3 - 1.2 mg/dL    GFR calc non Af Amer 38 (*) >90 mL/min    GFR calc Af Amer 44 (*) >90 mL/min   LIPASE, BLOOD     Status: Abnormal   Collection Time   10/27/11 10:03 PM      Component Value Range Comment   Lipase 1984 (*) 11 - 59 U/L   URINALYSIS, ROUTINE W REFLEX MICROSCOPIC     Status: Abnormal   Collection Time   10/27/11 10:04 PM      Component Value Range Comment   Color, Urine YELLOW  YELLOW    APPearance TURBID (*) CLEAR    Specific Gravity, Urine 1.018  1.005 - 1.030    pH 8.0  5.0 - 8.0    Glucose, UA NEGATIVE  NEGATIVE mg/dL    Hgb urine dipstick SMALL (*) NEGATIVE    Bilirubin Urine NEGATIVE  NEGATIVE    Ketones, ur NEGATIVE  NEGATIVE mg/dL    Protein, ur 30 (*) NEGATIVE mg/dL    Urobilinogen, UA 0.2  0.0 - 1.0 mg/dL    Nitrite POSITIVE (*) NEGATIVE    Leukocytes, UA LARGE (*) NEGATIVE   URINE MICROSCOPIC-ADD ON     Status: Abnormal   Collection Time   10/27/11 10:04 PM      Component Value Range Comment   Squamous Epithelial / LPF FEW (*) RARE    WBC, UA TOO NUMEROUS TO COUNT  <3 WBC/hpf    RBC / HPF 3-6  <3 RBC/hpf    Bacteria,  UA MANY (*) RARE   CBC     Status: Abnormal   Collection Time   10/28/11  3:58 AM      Component Value Range Comment   WBC 9.5  4.0 - 10.5 K/uL    RBC 3.47 (*) 3.87 - 5.11 MIL/uL    Hemoglobin 11.0 (*) 12.0 - 15.0 g/dL    HCT 16.1 (*) 09.6 - 46.0 %    MCV 94.5  78.0 - 100.0 fL    MCH 31.7  26.0 - 34.0 pg    MCHC 33.5  30.0 - 36.0 g/dL    RDW 04.5  40.9 - 81.1 %    Platelets 162  150 - 400 K/uL   BASIC METABOLIC PANEL     Status: Abnormal   Collection Time   10/28/11  3:58 AM      Component Value Range Comment   Sodium 138  135 - 145 mEq/L    Potassium 3.7  3.5 - 5.1 mEq/L    Chloride 106  96 - 112 mEq/L    CO2 22  19 - 32 mEq/L    Glucose, Bld 165 (*) 70 - 99 mg/dL    BUN 19  6 - 23 mg/dL    Creatinine, Ser 9.14  0.50 - 1.10 mg/dL    Calcium 8.5  8.4 - 78.2 mg/dL    GFR calc non Af Amer 50 (*) >90 mL/min    GFR calc Af Amer 58 (*) >90 mL/min     US Abdomen Complete  10/27/2011  *RADIOLOGY REPORT*  Clinical Data:  Right upper  quadrant pain.  Hypertension and high cholesterol.  COMPLETE ABDOMINAL ULTRASOUND  Comparison:  None.  Findings:  Gallbladder:  The gallbladder is distended.  There is sludge and probable small stones within.  No wall thickening or pericholecystic fluid. Sonographic Murphy's sign was not elicited.  Common bile duct: Normal, 2 mm.  Liver: Negative  IVC: Poorly visualized due to overlying bowel gas.  Pancreas:  Poorly visualized due to overlying bowel gas.  Spleen:  Normal in size and echogenicity.  Right Kidney:  11.0 cm.  Probable minimally complex cyst in the interpolar right kidney.  3.4 cm.  Left Kidney:  9.4 cm. No hydronephrosis.  Renal cortical thinning is mild.  Abdominal aorta:  Partially obscured by bowel gas.  No aneurysm or ascites.  IMPRESSION:  1.  Gallbladder sludge and probable stones.  Marked gallbladder distention, without specific evidence of acute cholecystitis. 2.  No other explanation for pain. 3.  Portions of anatomy obscured by overlying bowel  gas.  Original Report Authenticated By: Consuello Bossier, M.D.    Review of Systems  Constitutional: Positive for malaise/fatigue. Negative for fever and chills.  HENT: Negative for hearing loss and congestion.   Eyes: Negative for blurred vision and double vision.  Respiratory: Negative for cough and shortness of breath.   Cardiovascular: Negative for chest pain and palpitations.  Gastrointestinal: Positive for nausea, vomiting (In ER last night) and abdominal pain.  Genitourinary: Negative for dysuria, urgency and frequency.  Musculoskeletal: Positive for back pain and joint pain.  Skin: Negative for rash.  Neurological: Positive for weakness. Negative for dizziness, tingling, focal weakness and headaches.  Endo/Heme/Allergies: Bruises/bleeds easily (on aspirin).  Psychiatric/Behavioral: Negative for depression. The patient is not nervous/anxious.    Blood pressure 137/58, pulse 81, temperature 99.1 F (37.3 C), temperature source Oral, resp. rate 17, height 5\' 3"  (1.6 m), weight 165 lb (74.844 kg), SpO2 98.00%. Physical Exam  Constitutional: She is oriented to person, place, and time. She appears well-developed and well-nourished. No distress.  HENT:  Head: Normocephalic and atraumatic.  Eyes: EOM are normal. Pupils are equal, round, and reactive to light.  Neck: Normal range of motion. Neck supple.       Right carotid bruit (followed by primary care)  Cardiovascular: Normal rate and regular rhythm.   Murmur (Soft murmur) heard. Respiratory: Effort normal and breath sounds normal.  GI: Bowel sounds are normal. She exhibits distension (Mildly distended). There is tenderness (Especially in the epigastric and LUQ). There is no rebound.  Musculoskeletal: She exhibits no edema and no tenderness.       Some joint stiffness  Neurological: She is alert and oriented to person, place, and time.  Skin: Skin is warm and dry.  Psychiatric: She has a normal mood and affect. Her behavior is  normal. Thought content normal.    Assessment/Plan: 1.  Pancreatitis: possibly gallstone pancreatitis given ultrasound findings but no evidence of retained stone.  Patient clinically feels better today, no lipase today.  Possible history of similar pain several weeks ago.  May need cholecystectomy on this admission but will need for pancreatitis to resolve before proceeding.  Keep on sips of clears only and hydrate with IVFs for now.  Will have Dr. Janee Morn see and evaluate the patient later today and make further recommendations.  Miyeko Mahlum 10/28/2011, 9:49 AM

## 2011-10-28 NOTE — ED Notes (Signed)
Report given to receiving nurse on 5100. Pt admit to rm [id number]

## 2011-10-28 NOTE — H&P (Signed)
PCP:   No primary provider on file.   Chief Complaint: Abdominal pain   HPI: [name] is an 78 y.o. female with history of hypertension, peripheral vascular disease, hyperlipidemia, status post total knee and hip repair, presents to the med [facility] complaining of abdominal pain for one day. She has no prior similar problem. She denied any fever or chills, shortness of breath or chest pain, black stool, bloody stool, or diarrhea. She denied any dysuria or polyuria. Evaluation in the emergency room included a lipase of 1984, normal white count of 9.3 thousand, normal hemoglobin, with BUN and creatinine of 22/1.3. Her urinalysis is also positive for infection with too numerous to count WBCs large bacteria, positive nitrite and leukocytes. She also had a ultrasound of her abdomen which showed possible stones in the gallbladder with some sludge, but the common bile duct is not dilated and there is no evidence of cholecystitis nor Murphy's sign. Hospitalist was asked to admit her for pancreatitis, cholelithiasis, and UTI.  Rewiew of Systems:  The patient denies anorexia, fever, weight loss,, vision loss, decreased hearing, hoarseness, chest pain, syncope, dyspnea on exertion, peripheral edema, balance deficits, hemoptysis, melena, hematochezia, severe indigestion/heartburn, hematuria, incontinence, genital sores, muscle weakness, suspicious skin lesions, transient blindness, difficulty walking, depression, unusual weight change, abnormal bleeding, enlarged lymph nodes, angioedema, and breast masses.    Past Medical History  Diagnosis Date  . Hypertension   . PAD (peripheral artery disease)   . High cholesterol   . Arthritis     Past Surgical History  Procedure Date  . Total hip arthroplasty   . Total knee arthroplasty     Medications:  HOME MEDS: Prior to Admission medications   Medication Sig Start Date End Date Taking? Authorizing Provider  amLODipine-valsartan (EXFORGE)  5-320 MG per tablet Take 1 tablet by mouth daily.   Yes Historical Provider, MD  aspirin 81 MG tablet Take 81 mg by mouth daily.   Yes Historical Provider, MD  atenolol (TENORMIN) 50 MG tablet Take 50 mg by mouth daily.   Yes Historical Provider, MD  cholecalciferol (VITAMIN D) 1000 UNITS tablet Take 1,000 Units by mouth daily.   Yes Historical Provider, MD  doxazosin (CARDURA) 8 MG tablet Take 8 mg by mouth at bedtime.   Yes Historical Provider, MD  rosuvastatin (CRESTOR) 5 MG tablet Take 5 mg by mouth daily.   Yes Historical Provider, MD     Allergies:  Allergies  Allergen Reactions  . Penicillins   . Sulfa Antibiotics     Social History:   reports that she has never smoked. She does not have any smokeless tobacco history on file. She reports that she does not drink alcohol or use illicit drugs.  Family History: History reviewed. No pertinent family history.   Physical Exam: Filed Vitals:   10/27/11 2119 10/27/11 2230 10/28/11 0240  BP: 173/53 166/63 150/47  Pulse: 60 55 78  Temp: 97.8 F (36.6 C)  98.2 F (36.8 C)  TempSrc: Oral    Resp: 20 20 18   Height:   5\' 3"  (1.6 m)  Weight:   74.844 kg (165 lb)  SpO2: 99% 100% 94%   Blood pressure 150/47, pulse 78, temperature 98.2 F (36.8 C), temperature source Oral, resp. rate 18, height 5\' 3"  (1.6 m), weight 74.844 kg (165 lb), SpO2 94.00%.  GEN:  Pleasant  person lying in the stretcher in no acute distress; cooperative with exam PSYCH:  alert and oriented x4; does not appear  anxious or depressed; affect is appropriate. HEENT: Mucous membranes pink and anicteric; PERRLA; EOM intact; no cervical lymphadenopathy nor thyromegaly or carotid bruit; no JVD; Breasts:: Not examined CHEST WALL: No tenderness CHEST: Normal respiration, clear to auscultation bilaterally HEART: Regular rate and rhythm; she has a 2/6 systolic ejection murmur at the left sternal border with no rubs or gallops BACK: No kyphosis or scoliosis; no CVA  tenderness ABDOMEN: Obese, soft non-tender; no masses, no organomegaly, normal abdominal bowel sounds; no pannus; no intertriginous candida. Rectal Exam: Not done EXTREMITIES: No bone or joint deformity; age-appropriate arthropathy of the hands and knees; no edema; no ulcerations. Genitalia: not examined PULSES: 2+ and symmetric SKIN: Normal hydration no rash or ulceration CNS: Cranial nerves 2-12 grossly intact no focal lateralizing neurologic deficit   Labs & Imaging Results for orders placed during the hospital encounter of 10/27/11 (from the past 48 hour(s))  CBC     Status: Normal   Collection Time   10/27/11 10:03 PM      Component Value Range Comment   WBC 9.3  4.0 - 10.5 K/uL    RBC 3.94  3.87 - 5.11 MIL/uL    Hemoglobin 13.0  12.0 - 15.0 g/dL    HCT 16.1  09.6 - 04.5 %    MCV 95.2  78.0 - 100.0 fL    MCH 33.0  26.0 - 34.0 pg    MCHC 34.7  30.0 - 36.0 g/dL    RDW 40.9  81.1 - 91.4 %    Platelets 197  150 - 400 K/uL   DIFFERENTIAL     Status: Normal   Collection Time   10/27/11 10:03 PM      Component Value Range Comment   Neutrophils Relative 72  43 - 77 %    Neutro Abs 6.7  1.7 - 7.7 K/uL    Lymphocytes Relative 19  12 - 46 %    Lymphs Abs 1.8  0.7 - 4.0 K/uL    Monocytes Relative 7  3 - 12 %    Monocytes Absolute 0.7  0.1 - 1.0 K/uL    Eosinophils Relative 2  0 - 5 %    Eosinophils Absolute 0.1  0.0 - 0.7 K/uL    Basophils Relative 0  0 - 1 %    Basophils Absolute 0.0  0.0 - 0.1 K/uL   COMPREHENSIVE METABOLIC PANEL     Status: Abnormal   Collection Time   10/27/11 10:03 PM      Component Value Range Comment   Sodium 137  135 - 145 mEq/L    Potassium 4.1  3.5 - 5.1 mEq/L    Chloride 102  96 - 112 mEq/L    CO2 27  19 - 32 mEq/L    Glucose, Bld 121 (*) 70 - 99 mg/dL    BUN 22  6 - 23 mg/dL    Creatinine, Ser 7.82 (*) 0.50 - 1.10 mg/dL    Calcium 9.6  8.4 - 95.6 mg/dL    Total Protein 6.7  6.0 - 8.3 g/dL    Albumin 3.9  3.5 - 5.2 g/dL    AST 52 (*) 0 - 37 U/L      ALT 25  0 - 35 U/L    Alkaline Phosphatase 78  39 - 117 U/L    Total Bilirubin 0.9  0.3 - 1.2 mg/dL    GFR calc non Af Amer 38 (*) >90 mL/min    GFR calc Af Amer 44 (*) >  90 mL/min   LIPASE, BLOOD     Status: Abnormal   Collection Time   10/27/11 10:03 PM      Component Value Range Comment   Lipase 1984 (*) 11 - 59 U/L   URINALYSIS, ROUTINE W REFLEX MICROSCOPIC     Status: Abnormal   Collection Time   10/27/11 10:04 PM      Component Value Range Comment   Color, Urine YELLOW  YELLOW    APPearance TURBID (*) CLEAR    Specific Gravity, Urine 1.018  1.005 - 1.030    pH 8.0  5.0 - 8.0    Glucose, UA NEGATIVE  NEGATIVE mg/dL    Hgb urine dipstick SMALL (*) NEGATIVE    Bilirubin Urine NEGATIVE  NEGATIVE    Ketones, ur NEGATIVE  NEGATIVE mg/dL    Protein, ur 30 (*) NEGATIVE mg/dL    Urobilinogen, UA 0.2  0.0 - 1.0 mg/dL    Nitrite POSITIVE (*) NEGATIVE    Leukocytes, UA LARGE (*) NEGATIVE   URINE MICROSCOPIC-ADD ON     Status: Abnormal   Collection Time   10/27/11 10:04 PM      Component Value Range Comment   Squamous Epithelial / LPF FEW (*) RARE    WBC, UA TOO NUMEROUS TO COUNT  <3 WBC/hpf    RBC / HPF 3-6  <3 RBC/hpf    Bacteria, UA MANY (*) RARE    US Abdomen Complete  10/27/2011  *RADIOLOGY REPORT*  Clinical Data:  Right upper quadrant pain.  Hypertension and high cholesterol.  COMPLETE ABDOMINAL ULTRASOUND  Comparison:  None.  Findings:  Gallbladder:  The gallbladder is distended.  There is sludge and probable small stones within.  No wall thickening or pericholecystic fluid. Sonographic Murphy's sign was not elicited.  Common bile duct: Normal, 2 mm.  Liver: Negative  IVC: Poorly visualized due to overlying bowel gas.  Pancreas:  Poorly visualized due to overlying bowel gas.  Spleen:  Normal in size and echogenicity.  Right Kidney:  11.0 cm.  Probable minimally complex cyst in the interpolar right kidney.  3.4 cm.  Left Kidney:  9.4 cm. No hydronephrosis.  Renal cortical thinning  is mild.  Abdominal aorta:  Partially obscured by bowel gas.  No aneurysm or ascites.  IMPRESSION:  1.  Gallbladder sludge and probable stones.  Marked gallbladder distention, without specific evidence of acute cholecystitis. 2.  No other explanation for pain. 3.  Portions of anatomy obscured by overlying bowel gas.  Original Report Authenticated By: Consuello Bossier, M.D.      Assessment Present on Admission:  .Hypertension .Hypercholesterolemia UTI Heart murmur Pancreatitis Cholelithiasis  PLAN: Will admit her to the hospital, restrict diet to clear liquid. She likely has gallstone pancreatitis. Since the common bile duct is normal, and liver function tests were not significant, is possible that she had a passed stone. Consider MRCP, but I would think consulting GI for possible EGD is a better option. At some point in the future, she should consider a laparoscopic cholecystectomy. For her UTI she, she was given Rocephin and I will continue this. For her hypertension and hypercholesterolemia will continue her medication. I noted that she has a murmur  that is rather loud, and she denied any knowledge of it, therefore we will obtain an echo for heart. She is stable otherwise, full code, and will be admitted to [facility] hospitalist service.   Other plans as per orders.   Tkai Large 10/28/2011, 4:03 AM

## 2011-10-28 NOTE — Progress Notes (Signed)
[facility] PROGRESS NOTE  [name] ZOX:[id number] DOB: [birth year] DOA: 10/27/2011 PCP: Pearson Grippe, MD  Assessment/Plan: 1. Gallstone pancreatitis - improving today. Decreased pain and nausea. No further vomiting. Tolerating clear liquids. Exam with some mild RUQ tenderness. Plan for surgical consult for lap chole.  2. UTI - cx pending - c/w rocephin 3. HTN- acceptable control. Cont home meds  Active Problems:  Pancreatitis  Cholelithiasis  UTI (urinary tract infection)  Hypertension  Hypercholesterolemia  Code Status: full Family Communication: husband ETHLEEN, LORMAND [phone] Disposition Plan: home  Javan Gonzaga, MD  [facility] Pager [phone]  If 7PM-7AM, please contact night-coverage [email or web address] Password [email or web address] 10/28/2011, 9:30 AM   LOS: 1 day   Brief narrative: 78 yo woman admitted with pancreatitis  Consultants:  [facility]  Procedures:  Abd [place]  Antibiotics:  Rocephin 6/11  HPI/Subjective: Feels better today  Objective: Filed Vitals:   10/27/11 2119 10/27/11 2230 10/28/11 0240 10/28/11 0600  BP: 173/53 166/63 150/47 119/47  Pulse: 60 55 78 82  Temp: 97.8 F (36.6 C)  98.2 F (36.8 C) 97.8 F (36.6 C)  TempSrc: Oral     Resp: 20 20 18 18   Height:   5\' 3"  (1.6 m)   Weight:   74.844 kg (165 lb)   SpO2: 99% 100% 94% 94%    Intake/Output Summary (Last 24 hours) at 10/28/11 0930 Last data filed at 10/28/11 0300  Gross per 24 hour  Intake      0 ml  Output    300 ml  Net   -300 ml    Exam:   General:  axox3  Cardiovascular: RRR, 1/6 systolic murmur  Respiratory: CTAB  Abdomen: soft, minimal RUQ tenderness, audible BS  Data Reviewed: Basic Metabolic Panel:  Lab 10/28/11 6578 10/27/11 2203  NA 138 137  K 3.7 4.1  CL 106 102  CO2 22 27  GLUCOSE 165* 121*  BUN 19 22  CREATININE 1.04 1.30*  CALCIUM 8.5 9.6  MG -- --  PHOS -- --   Liver Function Tests:  Lab 10/27/11 2203  AST  52*  ALT 25  ALKPHOS 78  BILITOT 0.9  PROT 6.7  ALBUMIN 3.9    Lab 10/27/11 2203  LIPASE 1984*  AMYLASE --   No results found for this basename: AMMONIA:5 in the last 168 hours CBC:  Lab 10/28/11 0358 10/27/11 2203  WBC 9.5 9.3  NEUTROABS -- 6.7  HGB 11.0* 13.0  HCT 32.8* 37.5  MCV 94.5 95.2  PLT 162 197   Cardiac Enzymes: No results found for this basename: CKTOTAL:5,CKMB:5,CKMBINDEX:5,TROPONINI:5 in the last 168 hours BNP (last 3 results) No results found for this basename: PROBNP:3 in the last 8760 hours CBG: No results found for this basename: GLUCAP:5 in the last 168 hours  No results found for this or any previous visit (from the past 240 hour(s)).   Studies: US Abdomen Complete  10/27/2011  *RADIOLOGY REPORT*  Clinical Data:  Right upper quadrant pain.  Hypertension and high cholesterol.  COMPLETE ABDOMINAL ULTRASOUND  Comparison:  None.  Findings:  Gallbladder:  The gallbladder is distended.  There is sludge and probable small stones within.  No wall thickening or pericholecystic fluid. Sonographic Murphy's sign was not elicited.  Common bile duct: Normal, 2 mm.  Liver: Negative  IVC: Poorly visualized due to overlying bowel gas.  Pancreas:  Poorly visualized due to overlying bowel gas.  Spleen:  Normal in size and echogenicity.  Right Kidney:  11.0 cm.  Probable minimally complex cyst in the interpolar right kidney.  3.4 cm.  Left Kidney:  9.4 cm. No hydronephrosis.  Renal cortical thinning is mild.  Abdominal aorta:  Partially obscured by bowel gas.  No aneurysm or ascites.  IMPRESSION:  1.  Gallbladder sludge and probable stones.  Marked gallbladder distention, without specific evidence of acute cholecystitis. 2.  No other explanation for pain. 3.  Portions of anatomy obscured by overlying bowel gas.  Original Report Authenticated By: Consuello Bossier, M.D.    Scheduled Meds:    . amLODipine  5 mg Oral Daily  . aspirin  81 mg Oral Daily  . atenolol  50 mg Oral Daily   . atorvastatin  10 mg Oral q1800  . cefTRIAXone (ROCEPHIN)  IV  1 g Intravenous Once  . cefTRIAXone (ROCEPHIN)  IV  1 g Intravenous Q24H  . cholecalciferol  1,000 Units Oral Daily  . doxazosin  8 mg Oral QHS  . irbesartan  300 mg Oral Daily  .  morphine injection  4 mg Intravenous Once  .  morphine injection  4 mg Intravenous Once  . ondansetron      . ondansetron (ZOFRAN) IV  4 mg Intravenous Once  . sodium chloride  1,000 mL Intravenous Once  . DISCONTD: amLODipine-valsartan  1 tablet Oral Daily  . DISCONTD: aspirin  81 mg Oral Daily  . DISCONTD: enoxaparin  40 mg Subcutaneous Q24H   Continuous Infusions:    . dextrose 5 % and 0.9 % NaCl with KCl 20 mEq/L 75 mL/hr at 10/28/11 0423

## 2011-10-28 NOTE — Progress Notes (Signed)
Pt transferred to [facility] in stretcher,alert and oriented,denies pain,no sob.Admiting MD notified of patients' arrival.

## 2011-10-29 DIAGNOSIS — E782 Mixed hyperlipidemia: Secondary | ICD-10-CM | POA: Diagnosis not present

## 2011-10-29 DIAGNOSIS — K8020 Calculus of gallbladder without cholecystitis without obstruction: Secondary | ICD-10-CM

## 2011-10-29 DIAGNOSIS — K8010 Calculus of gallbladder with chronic cholecystitis without obstruction: Secondary | ICD-10-CM | POA: Diagnosis not present

## 2011-10-29 DIAGNOSIS — I359 Nonrheumatic aortic valve disorder, unspecified: Secondary | ICD-10-CM | POA: Diagnosis not present

## 2011-10-29 DIAGNOSIS — K859 Acute pancreatitis without necrosis or infection, unspecified: Secondary | ICD-10-CM | POA: Diagnosis not present

## 2011-10-29 DIAGNOSIS — I739 Peripheral vascular disease, unspecified: Secondary | ICD-10-CM | POA: Diagnosis present

## 2011-10-29 LAB — URINE CULTURE
Colony Count: >=100000
Culture  Setup Time: 201306120602

## 2011-10-29 LAB — AMYLASE: Amylase: 98 U/L (ref 0–105)

## 2011-10-29 LAB — CBC
HCT: 34.3 % — ABNORMAL LOW (ref 36.0–46.0)
Hemoglobin: 11.4 g/dL — ABNORMAL LOW (ref 12.0–15.0)
MCH: 32.9 pg (ref 26.0–34.0)
MCHC: 33.2 g/dL (ref 30.0–36.0)
MCV: 99.1 fL (ref 78.0–100.0)
RBC: 3.46 MIL/uL — ABNORMAL LOW (ref 3.87–5.11)

## 2011-10-29 LAB — HEPATIC FUNCTION PANEL
ALT: 181 U/L — ABNORMAL HIGH (ref 0–35)
AST: 137 U/L — ABNORMAL HIGH (ref 0–37)
Albumin: 2.9 g/dL — ABNORMAL LOW (ref 3.5–5.2)
Total Protein: 5.7 g/dL — ABNORMAL LOW (ref 6.0–8.3)

## 2011-10-29 LAB — LIPASE, BLOOD: Lipase: 54 U/L (ref 11–59)

## 2011-10-29 MED ORDER — CIPROFLOXACIN IN D5W 400 MG/200ML IV SOLN
400.0000 mg | INTRAVENOUS | Status: DC
  Filled 2011-10-29: qty 200

## 2011-10-29 MED ORDER — CHLORHEXIDINE GLUCONATE 0.12 % MT SOLN
15.0000 mL | Freq: Two times a day (BID) | OROMUCOSAL | Status: DC
  Administered 2011-10-29 – 2011-10-31 (×4): 15 mL via OROMUCOSAL
  Filled 2011-10-29 (×2): qty 15

## 2011-10-29 MED ORDER — DICLOFENAC EPOLAMINE 1.3 % TD PTCH
1.0000 | MEDICATED_PATCH | Freq: Two times a day (BID) | TRANSDERMAL | Status: DC
  Administered 2011-10-29 – 2011-11-02 (×8): 1 via TRANSDERMAL
  Filled 2011-10-29 (×11): qty 1

## 2011-10-29 MED ORDER — SODIUM CHLORIDE 0.9 % IV SOLN
INTRAVENOUS | Status: DC
  Administered 2011-10-29: 20 mL/h via INTRAVENOUS

## 2011-10-29 MED ORDER — POTASSIUM CHLORIDE CRYS ER 20 MEQ PO TBCR
40.0000 meq | EXTENDED_RELEASE_TABLET | Freq: Three times a day (TID) | ORAL | Status: DC
  Filled 2011-10-29 (×2): qty 2

## 2011-10-29 MED ORDER — BIOTENE DRY MOUTH MT LIQD
15.0000 mL | Freq: Two times a day (BID) | OROMUCOSAL | Status: DC
  Administered 2011-10-29 – 2011-10-30 (×3): 15 mL via OROMUCOSAL

## 2011-10-29 NOTE — Progress Notes (Signed)
[facility] PROGRESS NOTE  [name] WJX:[id number] DOB: [birth year] DOA: 10/27/2011 PCP: Pearson Grippe, MD  Assessment/Plan: 1. Gallstone pancreatitis - improving today. Decreased pain and nausea. No further vomiting. Tolerating clear liquids. Exam with some mild RUQ tenderness. lap chole planned for 10/30/11 2. UTI - cx 100,000 GNR - final result pending - c/w iv rocephin 3. HTN- acceptable control. Cont home meds 4. Systolic murmur - 2 d echo pending . According to patient she had an outpatient stress test and cardiac cath by Dr. Aleen Campi years ago - requested records from office. 5. right shoulder pain s/p arthroplasty- local rx  Active Problems:  Pancreatitis  Cholelithiasis  UTI (urinary tract infection)  Hypertension  Hypercholesterolemia  Code Status: full Family Communication: husband TAL, NEER [phone] Disposition Plan: home  Reagan Klemz, MD  [facility] Pager [phone]  If 7PM-7AM, please contact night-coverage [email or web address] Password [email or web address] 10/29/2011, 2:30 PM   LOS: 2 days   Brief narrative: 78 yo woman admitted with pancreatitis  Consultants:  [facility]  Procedures:  Abd [place]  Antibiotics:  Rocephin 6/11  HPI/Subjective: No abdominal pain, no Nausea no vomiting C/o right shoulder pain    Objective: Filed Vitals:   10/28/11 2057 10/28/11 2128 10/29/11 0644 10/29/11 1343  BP: 149/52 149/52 137/58 139/62  Pulse:  69 80 68  Temp:  99.7 F (37.6 C) 97.8 F (36.6 C) 99.1 F (37.3 C)  TempSrc:  Oral Oral Oral  Resp:  18 20 18   Height:      Weight:      SpO2:  93% 91% 97%    Intake/Output Summary (Last 24 hours) at 10/29/11 1430 Last data filed at 10/29/11 0900  Gross per 24 hour  Intake 3323.25 ml  Output    275 ml  Net 3048.25 ml    Exam:   General:  axox3  Cardiovascular: RRR, 1/6 systolic murmur  Respiratory: CTAB  Abdomen: soft, minimal RUQ tenderness, audible BS  Data  Reviewed: Basic Metabolic Panel:  Lab 10/28/11 8413 10/27/11 2203  NA 138 137  K 3.7 4.1  CL 106 102  CO2 22 27  GLUCOSE 165* 121*  BUN 19 22  CREATININE 1.04 1.30*  CALCIUM 8.5 9.6  MG -- --  PHOS -- --   Liver Function Tests:  Lab 10/29/11 0655 10/27/11 2203  AST 137* 52*  ALT 181* 25  ALKPHOS 103 78  BILITOT 1.1 0.9  PROT 5.7* 6.7  ALBUMIN 2.9* 3.9    Lab 10/29/11 0655 10/27/11 2203  LIPASE 54 1984*  AMYLASE 98 --   No results found for this basename: AMMONIA:5 in the last 168 hours CBC:  Lab 10/29/11 0655 10/28/11 0358 10/27/11 2203  WBC 6.0 9.5 9.3  NEUTROABS -- -- 6.7  HGB 11.4* 11.0* 13.0  HCT 34.3* 32.8* 37.5  MCV 99.1 94.5 95.2  PLT 156 162 197   Cardiac Enzymes: No results found for this basename: CKTOTAL:5,CKMB:5,CKMBINDEX:5,TROPONINI:5 in the last 168 hours BNP (last 3 results) No results found for this basename: PROBNP:3 in the last 8760 hours CBG: No results found for this basename: GLUCAP:5 in the last 168 hours  Recent Results (from the past 240 hour(s))  URINE CULTURE     Status: Normal (Preliminary result)   Collection Time   10/27/11 10:04 PM      Component Value Range Status Comment   Specimen Description URINE, CLEAN CATCH   Final    Special Requests NONE   Final    Culture  Setup Time [id number]   Final    Colony Count >=100,000 COLONIES/ML   Final    Culture GRAM NEGATIVE RODS   Final    Report Status PENDING   Incomplete      Studies: US Abdomen Complete  10/27/2011  *RADIOLOGY REPORT*  Clinical Data:  Right upper quadrant pain.  Hypertension and high cholesterol.  COMPLETE ABDOMINAL ULTRASOUND  Comparison:  None.  Findings:  Gallbladder:  The gallbladder is distended.  There is sludge and probable small stones within.  No wall thickening or pericholecystic fluid. Sonographic Murphy's sign was not elicited.  Common bile duct: Normal, 2 mm.  Liver: Negative  IVC: Poorly visualized due to overlying bowel gas.  Pancreas:  Poorly  visualized due to overlying bowel gas.  Spleen:  Normal in size and echogenicity.  Right Kidney:  11.0 cm.  Probable minimally complex cyst in the interpolar right kidney.  3.4 cm.  Left Kidney:  9.4 cm. No hydronephrosis.  Renal cortical thinning is mild.  Abdominal aorta:  Partially obscured by bowel gas.  No aneurysm or ascites.  IMPRESSION:  1.  Gallbladder sludge and probable stones.  Marked gallbladder distention, without specific evidence of acute cholecystitis. 2.  No other explanation for pain. 3.  Portions of anatomy obscured by overlying bowel gas.  Original Report Authenticated By: Consuello Bossier, M.D.    Scheduled Meds:    . amLODipine  5 mg Oral Daily  . antiseptic oral rinse  15 mL Mouth Rinse q12n4p  . aspirin  81 mg Oral Daily  . atenolol  50 mg Oral Daily  . atorvastatin  10 mg Oral q1800  . cefTRIAXone (ROCEPHIN)  IV  1 g Intravenous Q24H  . chlorhexidine  15 mL Mouth Rinse BID  . cholecalciferol  1,000 Units Oral Daily  . ciprofloxacin  400 mg Intravenous On Call to OR  . diclofenac  1 patch Transdermal BID  . doxazosin  8 mg Oral QHS  . irbesartan  300 mg Oral Daily  . DISCONTD: potassium chloride  40 mEq Oral TID   Continuous Infusions:    . sodium chloride    . DISCONTD: dextrose 5 % and 0.9 % NaCl with KCl 20 mEq/L 75 mL/hr at 10/29/11 0550

## 2011-10-29 NOTE — Progress Notes (Signed)
  Echocardiogram 2D Echocardiogram has been performed.  Jorje Guild [facility] 10/29/2011, 9:09 AM

## 2011-10-29 NOTE — Progress Notes (Signed)
  Subjective: Feels good this morning, no further c/o of abdominal pain or discomfort. No recent episodes of nausea, emesis. She has been tolerating clear liquids well.   Objective: Vital signs in last 24 hours: Temp:  [97.8 F (36.6 C)-99.7 F (37.6 C)] 97.8 F (36.6 C) (06/13 0644) Pulse Rate:  [69-80] 80  (06/13 0644) Resp:  [18-20] 20  (06/13 0644) BP: (122-149)/(51-58) 137/58 mmHg (06/13 0644) SpO2:  [91 %-96 %] 91 % (06/13 0644) Last BM Date: 10/27/11  Intake/Output from previous day: 06/12 0701 - 06/13 0700 In: 3203.3 [P.O.:600; I.V.:2603.3] Out: 275 [Urine:275] Intake/Output this shift: Total I/O In: 240 [P.O.:240] Out: -   General appearance: alert, cooperative, appears stated age and no distress Abdomen: soft, non-tender, positive BS, no BM recently. Chest: CTA bilaterally.  Lab Results:   Basename 10/29/11 0655 10/28/11 0358  WBC 6.0 9.5  HGB 11.4* 11.0*  HCT 34.3* 32.8*  PLT 156 162   BMET  Basename 10/28/11 0358 10/27/11 2203  NA 138 137  K 3.7 4.1  CL 106 102  CO2 22 27  GLUCOSE 165* 121*  BUN 19 22  CREATININE 1.04 1.30*  CALCIUM 8.5 9.6   PT/INR No results found for this basename: LABPROT:2,INR:2 in the last 72 hours ABG No results found for this basename: PHART:2,PCO2:2,PO2:2,HCO3:2 in the last 72 hours  Studies/Results: US Abdomen Complete  10/27/2011  *RADIOLOGY REPORT*  Clinical Data:  Right upper quadrant pain.  Hypertension and high cholesterol.  COMPLETE ABDOMINAL ULTRASOUND  Comparison:  None.  Findings:  Gallbladder:  The gallbladder is distended.  There is sludge and probable small stones within.  No wall thickening or pericholecystic fluid. Sonographic Murphy's sign was not elicited.  Common bile duct: Normal, 2 mm.  Liver: Negative  IVC: Poorly visualized due to overlying bowel gas.  Pancreas:  Poorly visualized due to overlying bowel gas.  Spleen:  Normal in size and echogenicity.  Right Kidney:  11.0 cm.  Probable minimally  complex cyst in the interpolar right kidney.  3.4 cm.  Left Kidney:  9.4 cm. No hydronephrosis.  Renal cortical thinning is mild.  Abdominal aorta:  Partially obscured by bowel gas.  No aneurysm or ascites.  IMPRESSION:  1.  Gallbladder sludge and probable stones.  Marked gallbladder distention, without specific evidence of acute cholecystitis. 2.  No other explanation for pain. 3.  Portions of anatomy obscured by overlying bowel gas.  Original Report Authenticated By: Consuello Bossier, M.D.    Anti-infectives: Anti-infectives     Start     Dose/Rate Route Frequency Ordered Stop   10/28/11 2200   cefTRIAXone (ROCEPHIN) 1 g in dextrose 5 % 50 mL IVPB        1 g 100 mL/hr over 30 Minutes Intravenous Every 24 hours 10/28/11 0341     10/28/11 0000   cefTRIAXone (ROCEPHIN) 1 g in dextrose 5 % 50 mL IVPB        1 g 100 mL/hr over 30 Minutes Intravenous  Once 10/27/11 2353 10/28/11 0033          Assessment/Plan: s/p * No surgery found * 1. Npo after midnight in anticipation of scheduled cholecystectomy in am. 2. Cipro 400 mg IV on call to o.r.  LOS: 2 days    [name] 10/29/2011

## 2011-10-29 NOTE — Progress Notes (Signed)
Amylase and lipase better Had 2-d echo this AM Plan laparoscopic cholecystectomy in AM if echo OK. I discussed the procedure in detail. We discussed the risks and benefits of a laparoscopic cholecystectomy and possible cholangiogram including, but not limited to bleeding, infection, injury to surrounding structures such as the intestine or liver, bile leak, retained gallstones, need to convert to an open procedure, prolonged diarrhea, blood clots such as  DVT, common bile duct injury, anesthesia risks, and possible need for additional procedures.  The likelihood of improvement in symptoms and return to the patient's normal status is good. We discussed the typical post-operative recovery course.

## 2011-10-30 ENCOUNTER — Inpatient Hospital Stay (HOSPITAL_COMMUNITY): Payer: Medicare Other

## 2011-10-30 ENCOUNTER — Encounter (HOSPITAL_COMMUNITY)
Admission: EM | Disposition: A | Discharge status: Home / Self Care | Payer: Self-pay | Source: Ambulatory Visit | Attending: Internal Medicine

## 2011-10-30 ENCOUNTER — Encounter (HOSPITAL_COMMUNITY): Payer: Self-pay

## 2011-10-30 ENCOUNTER — Encounter (HOSPITAL_COMMUNITY): Payer: Self-pay | Admitting: Certified Registered"

## 2011-10-30 DIAGNOSIS — R109 Unspecified abdominal pain: Secondary | ICD-10-CM | POA: Diagnosis not present

## 2011-10-30 DIAGNOSIS — R932 Abnormal findings on diagnostic imaging of liver and biliary tract: Secondary | ICD-10-CM | POA: Diagnosis not present

## 2011-10-30 DIAGNOSIS — K8051 Calculus of bile duct without cholangitis or cholecystitis with obstruction: Secondary | ICD-10-CM | POA: Diagnosis present

## 2011-10-30 DIAGNOSIS — K8020 Calculus of gallbladder without cholecystitis without obstruction: Secondary | ICD-10-CM

## 2011-10-30 DIAGNOSIS — K859 Acute pancreatitis without necrosis or infection, unspecified: Secondary | ICD-10-CM

## 2011-10-30 DIAGNOSIS — K8010 Calculus of gallbladder with chronic cholecystitis without obstruction: Secondary | ICD-10-CM | POA: Diagnosis not present

## 2011-10-30 DIAGNOSIS — E782 Mixed hyperlipidemia: Secondary | ICD-10-CM

## 2011-10-30 HISTORY — PX: CHOLECYSTECTOMY: SHX55

## 2011-10-30 IMAGING — RF DG CHOLANGIOGRAM OPERATIVE
1 series · 8 of 8 positions shown · non-contrast
Comparison: Ultrasound of the abdomen [DATE]

CLINICAL DATA: Gallstone pancreatitis.

INTRAOPERATIVE CHOLANGIOGRAM
TECHNIQUE: Cholangiographic images from the C-arm fluoroscopic
device were submitted for interpretation post-operatively.  Please
see the procedural report for the amount of contrast and the
fluoroscopy time utilized.

[Series 1: run · 3 acquisitions, 8 frames shown]
[im 1/3]
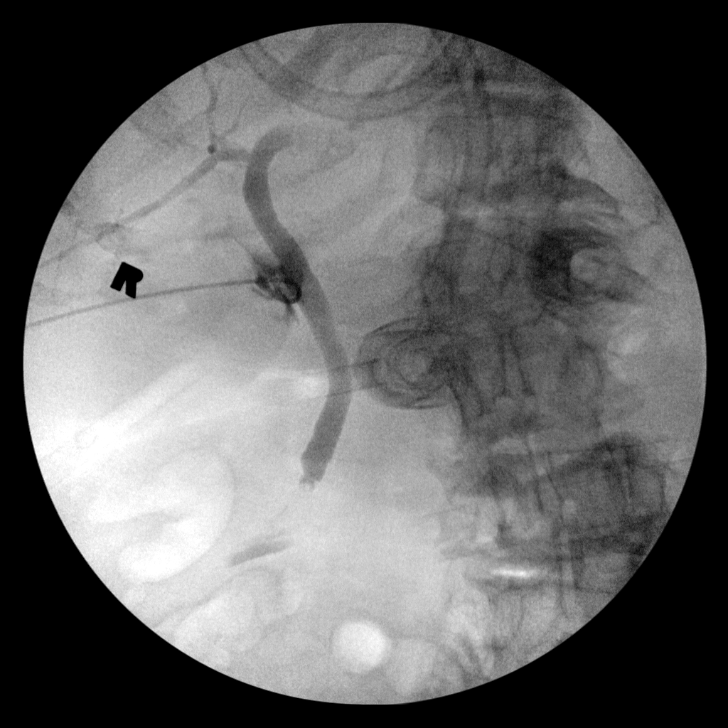
[im 1/3]
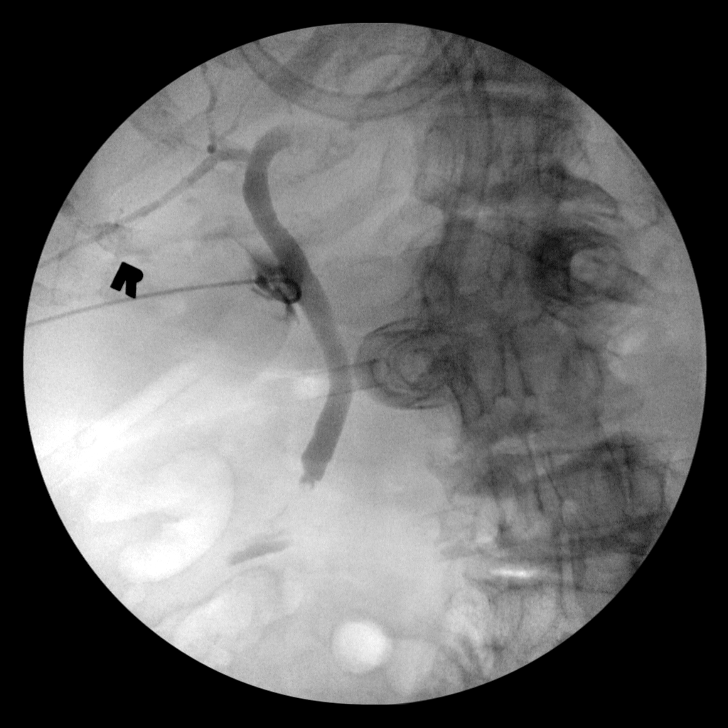
[im 1/3]
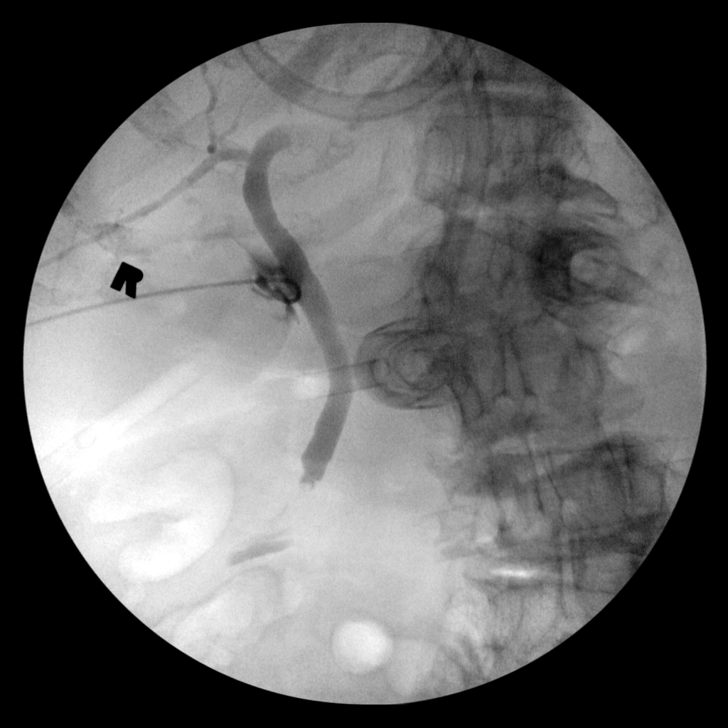
[im 2/3]
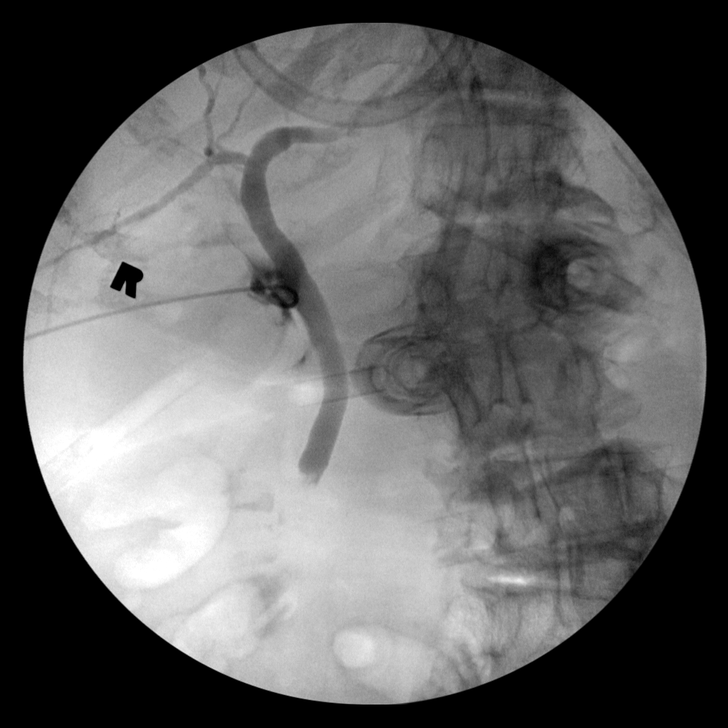
[im 2/3]
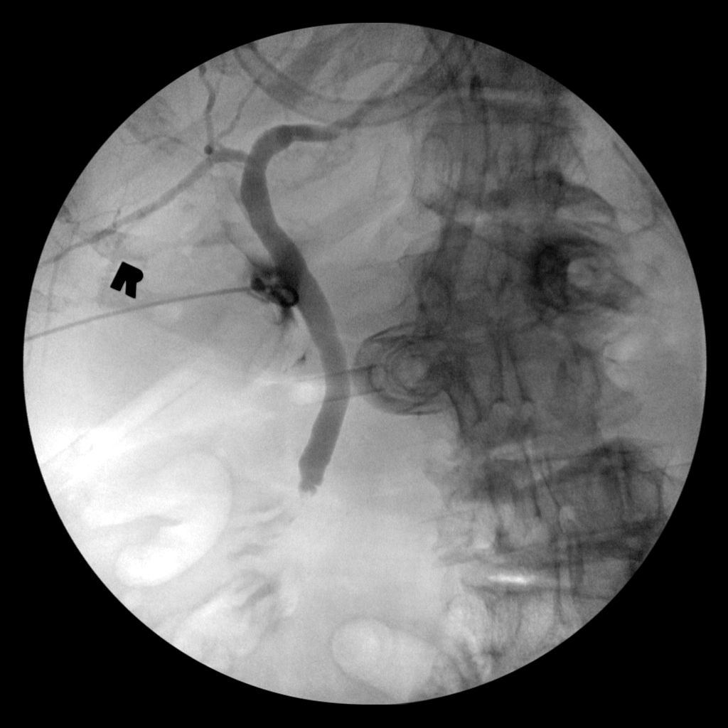
[im 2/3]
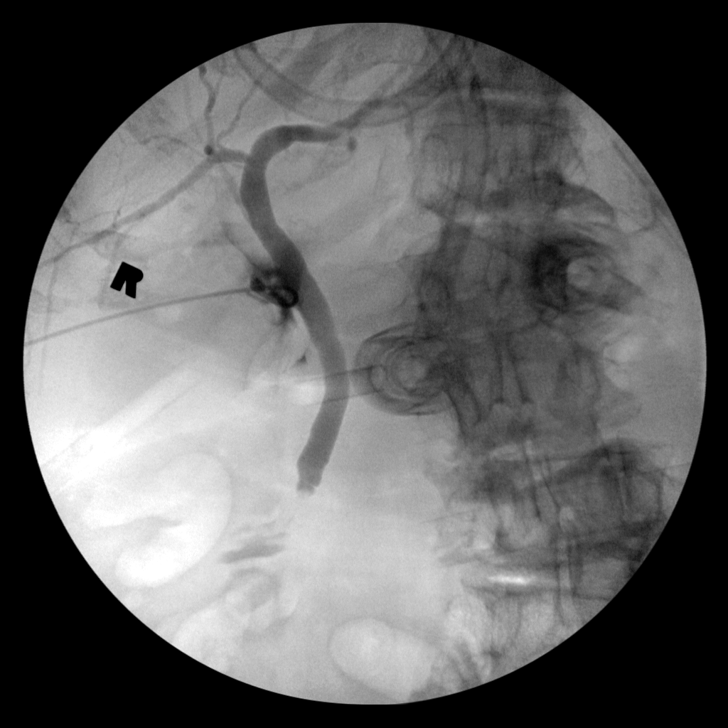
[im 2/3]
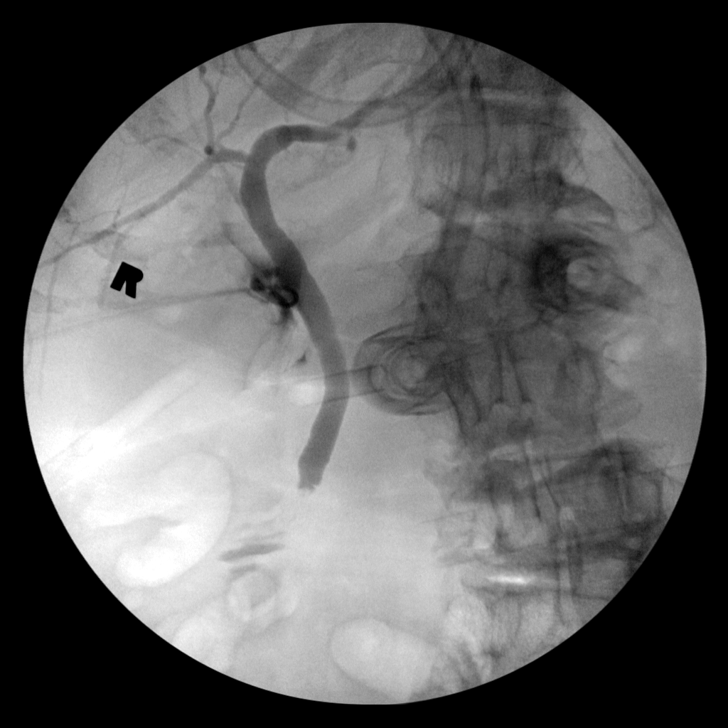
[im 3/3]
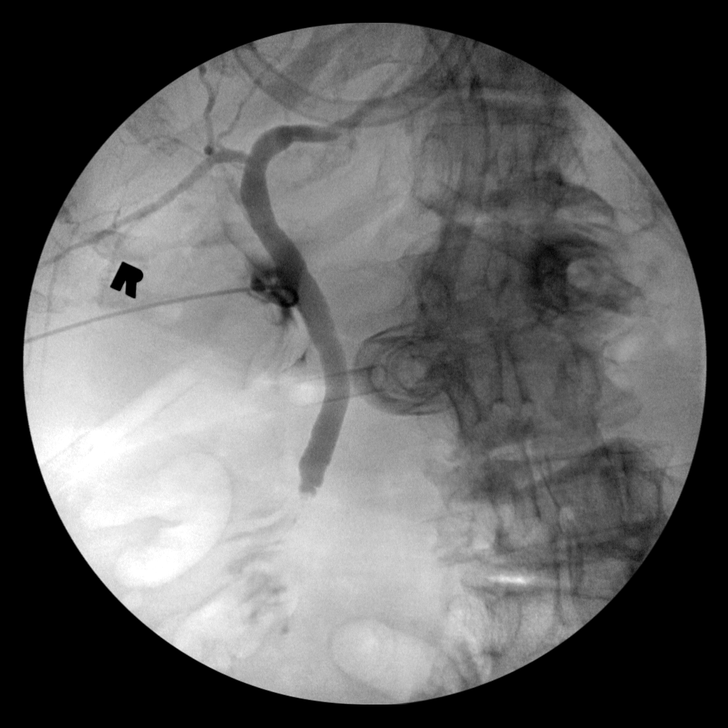

[8 of 8 positions shown; findings below may reference images not displayed]

FINDINGS: The gallbladder has been removed and the cystic duct
cannulated.  Common hepatic and common bile ducts appear mildly
dilated.  There is only faint passage of contrast into the
duodenum.  At the level of the ampulla, there is a filling defect
which persists on all three series of images.  I am concerned this
could represent retained distal CBD stone or stones.
IMPRESSION: Slightly distended common hepatic and common bile ducts, with faint
passage of contrast into the duodenum, and a suspected  filling
defect at the ampulla.  Distal common bile duct stone is not
excluded.

## 2011-10-30 SURGERY — LAPAROSCOPIC CHOLECYSTECTOMY WITH INTRAOPERATIVE CHOLANGIOGRAM
Anesthesia: General | Site: Abdomen | Wound class: Clean Contaminated

## 2011-10-30 MED ORDER — LIDOCAINE HCL (CARDIAC) 20 MG/ML IV SOLN
INTRAVENOUS | Status: DC | PRN
  Administered 2011-10-30: 60 mg via INTRAVENOUS

## 2011-10-30 MED ORDER — LABETALOL HCL 5 MG/ML IV SOLN
INTRAVENOUS | Status: DC | PRN
  Administered 2011-10-30 (×2): 10 mg via INTRAVENOUS

## 2011-10-30 MED ORDER — HYDROMORPHONE HCL PF 1 MG/ML IJ SOLN
0.2500 mg | INTRAMUSCULAR | Status: DC | PRN
  Administered 2011-10-30 (×2): 0.5 mg via INTRAVENOUS

## 2011-10-30 MED ORDER — MIDAZOLAM HCL 5 MG/5ML IJ SOLN
INTRAMUSCULAR | Status: DC | PRN
  Administered 2011-10-30: 1 mg via INTRAVENOUS

## 2011-10-30 MED ORDER — NEOSTIGMINE METHYLSULFATE 1 MG/ML IJ SOLN
INTRAMUSCULAR | Status: DC | PRN
  Administered 2011-10-30: 3 mg via INTRAVENOUS

## 2011-10-30 MED ORDER — ONDANSETRON HCL 4 MG/2ML IJ SOLN
INTRAMUSCULAR | Status: DC | PRN
  Administered 2011-10-30: 4 mg via INTRAVENOUS

## 2011-10-30 MED ORDER — LORAZEPAM 2 MG/ML IJ SOLN: 1.0000 mg | Freq: Once | INTRAMUSCULAR | Status: DC | PRN

## 2011-10-30 MED ORDER — SODIUM CHLORIDE 0.9 % IR SOLN
Status: DC | PRN
  Administered 2011-10-30: 1

## 2011-10-30 MED ORDER — GLYCOPYRROLATE 0.2 MG/ML IJ SOLN
INTRAMUSCULAR | Status: DC | PRN
  Administered 2011-10-30: .5 mg via INTRAVENOUS

## 2011-10-30 MED ORDER — SODIUM CHLORIDE 0.9 % IV SOLN
INTRAVENOUS | Status: DC | PRN
  Administered 2011-10-30: 11:00:00

## 2011-10-30 MED ORDER — 0.9 % SODIUM CHLORIDE (POUR BTL) OPTIME
TOPICAL | Status: DC | PRN
  Administered 2011-10-30: 1000 mL

## 2011-10-30 MED ORDER — TIZANIDINE HCL 4 MG PO TABS
4.0000 mg | ORAL_TABLET | Freq: Three times a day (TID) | ORAL | Status: DC | PRN
  Filled 2011-10-30 (×2): qty 1

## 2011-10-30 MED ORDER — BUPIVACAINE-EPINEPHRINE 0.25% -1:200000 IJ SOLN
INTRAMUSCULAR | Status: DC | PRN
  Administered 2011-10-30: 16 mL

## 2011-10-30 MED ORDER — FENTANYL CITRATE 0.05 MG/ML IJ SOLN
INTRAMUSCULAR | Status: DC | PRN
  Administered 2011-10-30: 100 ug via INTRAVENOUS

## 2011-10-30 MED ORDER — ROCURONIUM BROMIDE 100 MG/10ML IV SOLN
INTRAVENOUS | Status: DC | PRN
  Administered 2011-10-30: 50 mg via INTRAVENOUS

## 2011-10-30 MED ORDER — LACTATED RINGERS IV SOLN
INTRAVENOUS | Status: DC
  Administered 2011-10-30 – 2011-10-31 (×3): via INTRAVENOUS

## 2011-10-30 MED ORDER — HYDROMORPHONE HCL PF 1 MG/ML IJ SOLN
INTRAMUSCULAR | Status: AC
  Administered 2011-10-30: 0.5 mg via INTRAVENOUS
  Filled 2011-10-30: qty 1

## 2011-10-30 MED ORDER — PROPOFOL 10 MG/ML IV EMUL
INTRAVENOUS | Status: DC | PRN
  Administered 2011-10-30: 14 mg via INTRAVENOUS

## 2011-10-30 SURGICAL SUPPLY — 52 items
ADH SKN CLS APL DERMABOND .7 (GAUZE/BANDAGES/DRESSINGS) ×1
APPLIER CLIP 5 13 M/L LIGAMAX5 (MISCELLANEOUS) ×2
APPLIER CLIP ROT 10 11.4 M/L (STAPLE)
APR CLP MED LRG 11.4X10 (STAPLE)
APR CLP MED LRG 5 ANG JAW (MISCELLANEOUS) ×1
BAG SPEC RTRVL LRG 6X4 10 (ENDOMECHANICALS) ×1
BLADE SURG ROTATE 9660 (MISCELLANEOUS) IMPLANT
CANISTER SUCTION 2500CC (MISCELLANEOUS) ×2 IMPLANT
CHLORAPREP W/TINT 26ML (MISCELLANEOUS) ×2 IMPLANT
CLIP APPLIE 5 13 M/L LIGAMAX5 (MISCELLANEOUS) ×1 IMPLANT
CLIP APPLIE ROT 10 11.4 M/L (STAPLE) IMPLANT
CLOTH BEACON ORANGE TIMEOUT ST (SAFETY) ×2 IMPLANT
COVER MAYO STAND STRL (DRAPES) ×2 IMPLANT
COVER SURGICAL LIGHT HANDLE (MISCELLANEOUS) ×2 IMPLANT
DECANTER SPIKE VIAL GLASS SM (MISCELLANEOUS) ×2 IMPLANT
DERMABOND ADVANCED (GAUZE/BANDAGES/DRESSINGS) ×1
DERMABOND ADVANCED .7 DNX12 (GAUZE/BANDAGES/DRESSINGS) ×1 IMPLANT
DRAPE C-ARM 42X72 X-RAY (DRAPES) ×2 IMPLANT
DRAPE UTILITY 15X26 W/TAPE STR (DRAPE) ×4 IMPLANT
ELECT REM PT RETURN 9FT ADLT (ELECTROSURGICAL) ×2
ELECTRODE REM PT RTRN 9FT ADLT (ELECTROSURGICAL) ×1 IMPLANT
FILTER SMOKE EVAC LAPAROSHD (FILTER) ×1 IMPLANT
GLOVE BIO SURGEON STRL SZ 6.5 (GLOVE) ×1 IMPLANT
GLOVE BIO SURGEON STRL SZ7 (GLOVE) ×1 IMPLANT
GLOVE BIO SURGEON STRL SZ8 (GLOVE) ×2 IMPLANT
GLOVE BIOGEL M 7.0 STRL (GLOVE) ×1 IMPLANT
GLOVE BIOGEL PI IND STRL 7.0 (GLOVE) IMPLANT
GLOVE BIOGEL PI IND STRL 7.5 (GLOVE) IMPLANT
GLOVE BIOGEL PI IND STRL 8 (GLOVE) ×1 IMPLANT
GLOVE BIOGEL PI INDICATOR 7.0 (GLOVE) ×3
GLOVE BIOGEL PI INDICATOR 7.5 (GLOVE) ×1
GLOVE BIOGEL PI INDICATOR 8 (GLOVE) ×1
GOWN PREVENTION PLUS XLARGE (GOWN DISPOSABLE) ×2 IMPLANT
GOWN STRL NON-REIN LRG LVL3 (GOWN DISPOSABLE) ×7 IMPLANT
KIT BASIN OR (CUSTOM PROCEDURE TRAY) ×2 IMPLANT
KIT ROOM TURNOVER OR (KITS) ×2 IMPLANT
NS IRRIG 1000ML POUR BTL (IV SOLUTION) ×2 IMPLANT
PAD ARMBOARD 7.5X6 YLW CONV (MISCELLANEOUS) ×4 IMPLANT
POUCH SPECIMEN RETRIEVAL 10MM (ENDOMECHANICALS) ×2 IMPLANT
SCISSORS LAP 5X35 DISP (ENDOMECHANICALS) ×1 IMPLANT
SET CHOLANGIOGRAPH 5 50 .035 (SET/KITS/TRAYS/PACK) ×2 IMPLANT
SET IRRIG TUBING LAPAROSCOPIC (IRRIGATION / IRRIGATOR) ×2 IMPLANT
SLEEVE ADV FIXATION 5X100MM (TROCAR) ×4 IMPLANT
SPECIMEN JAR SMALL (MISCELLANEOUS) ×2 IMPLANT
SUT VIC AB 4-0 PS2 27 (SUTURE) ×2 IMPLANT
TOWEL OR 17X24 6PK STRL BLUE (TOWEL DISPOSABLE) ×2 IMPLANT
TOWEL OR 17X26 10 PK STRL BLUE (TOWEL DISPOSABLE) ×2 IMPLANT
TRAY LAPAROSCOPIC (CUSTOM PROCEDURE TRAY) ×2 IMPLANT
TROCAR HASSON GELL 12X100 (TROCAR) ×2 IMPLANT
TROCAR Z-THREAD FIOS 11X100 BL (TROCAR) IMPLANT
TROCAR Z-THREAD FIOS 5X100MM (TROCAR) ×2 IMPLANT
WATER STERILE IRR 1000ML POUR (IV SOLUTION) IMPLANT

## 2011-10-30 NOTE — Progress Notes (Signed)
[facility] PROGRESS NOTE  ADREAN FINDLAY AVW:[id number] DOB: 06-06-1930 DOA: 10/27/2011 PCP: Pearson Grippe, MD  Assessment/Plan: 1. Gallstone pancreatitis - . Decreased pain and nausea. No further vomiting. Tolerating clear liquids. Exam with some mild RUQ tenderness. Lap cholecystectomy on 10/30/11. IOC reveals CBD stone - plan for ERCP 11/01/11 2. Proteus UTI - afebrile, improving - c/w iv rocephin 3. HTN- acceptable control. Continue home meds. 4. Systolic murmur - 2 d echo reveals mild to moderate increase in PAP , maybe murmur is related to TR . Not hemodynamically significant.  According to patient she had an outpatient stress test and cardiac cath by Dr. Aleen Campi years ago - requested records from office. 5. right shoulder pain s/p arthroplasty- local rx  Principal Problem:  *Pancreatitis Active Problems:  Cholelithiasis  UTI (urinary tract infection)  Hypertension  Hypercholesterolemia  PVD (peripheral vascular disease)  Choledocholithiasis with obstruction  Code Status: full Family Communication: husband ORPHIA, MCTIGUE [phone] Disposition Plan: home  Yoskar Murrillo, MD  [facility] Pager [phone]  If 7PM-7AM, please contact night-coverage [email or web address] Password [email or web address] 10/30/2011, 5:00 PM   LOS: 3 days   Brief narrative: 78 yo woman admitted with pancreatitis  Consultants:  [facility]  Procedures:  Abd [place]  Antibiotics:  Rocephin 6/11  HPI/Subjective: No abdominal pain, no Nausea no vomiting C/o right shoulder pain    Objective: Filed Vitals:   10/30/11 1312 10/30/11 1315 10/30/11 1345 10/30/11 1407  BP: 148/49  160/56 155/50  Pulse:  50 55 78  Temp:   97.1 F (36.2 C) 98 F (36.7 C)  TempSrc:    Oral  Resp:  14 16 16   Height:      Weight:      SpO2:  98% 98% 92%    Intake/Output Summary (Last 24 hours) at 10/30/11 1700 Last data filed at 10/30/11 1541  Gross per 24 hour  Intake 1293.67 ml  Output    1100 ml  Net 193.67 ml    Exam:   General:  axox3  Cardiovascular: RRR, 1/6 systolic murmur  Respiratory: CTAB  Abdomen: soft, minimal RUQ tenderness, audible BS  Data Reviewed: Basic Metabolic Panel:  Lab 10/28/11 6295 10/27/11 2203  NA 138 137  K 3.7 4.1  CL 106 102  CO2 22 27  GLUCOSE 165* 121*  BUN 19 22  CREATININE 1.04 1.30*  CALCIUM 8.5 9.6  MG -- --  PHOS -- --   Liver Function Tests:  Lab 10/29/11 0655 10/27/11 2203  AST 137* 52*  ALT 181* 25  ALKPHOS 103 78  BILITOT 1.1 0.9  PROT 5.7* 6.7  ALBUMIN 2.9* 3.9    Lab 10/29/11 0655 10/27/11 2203  LIPASE 54 1984*  AMYLASE 98 --   No results found for this basename: AMMONIA:5 in the last 168 hours CBC:  Lab 10/29/11 0655 10/28/11 0358 10/27/11 2203  WBC 6.0 9.5 9.3  NEUTROABS -- -- 6.7  HGB 11.4* 11.0* 13.0  HCT 34.3* 32.8* 37.5  MCV 99.1 94.5 95.2  PLT 156 162 197   Cardiac Enzymes: No results found for this basename: CKTOTAL:5,CKMB:5,CKMBINDEX:5,TROPONINI:5 in the last 168 hours BNP (last 3 results) No results found for this basename: PROBNP:3 in the last 8760 hours CBG: No results found for this basename: GLUCAP:5 in the last 168 hours  Recent Results (from the past 240 hour(s))  URINE CULTURE     Status: Normal   Collection Time   10/27/11 10:04 PM      Component Value Range Status  Comment   Specimen Description URINE, CLEAN CATCH   Final    Special Requests NONE   Final    Culture  Setup Time [id number]   Final    Colony Count >=100,000 COLONIES/ML   Final    Culture PROTEUS MIRABILIS   Final    Report Status 10/29/2011 FINAL   Final    Organism ID, Bacteria PROTEUS MIRABILIS   Final   SURGICAL PCR SCREEN     Status: Normal   Collection Time   10/30/11  5:37 AM      Component Value Range Status Comment   MRSA, PCR NEGATIVE  NEGATIVE Final    Staphylococcus aureus NEGATIVE  NEGATIVE Final      Studies: Dg Cholangiogram Operative  10/30/2011  *RADIOLOGY REPORT*  Clinical Data:    Gallstone pancreatitis.  INTRAOPERATIVE CHOLANGIOGRAM  Technique:  Cholangiographic images from the C-arm fluoroscopic device were submitted for interpretation post-operatively.  Please see the procedural report for the amount of contrast and the fluoroscopy time utilized.  Comparison:  Ultrasound of the abdomen 10/27/2011  Findings:  The gallbladder has been removed and the cystic duct cannulated.  Common hepatic and common bile ducts appear mildly dilated.  There is only faint passage of contrast into the duodenum.  At the level of the ampulla, there is a filling defect which persists on all three series of images.  I am concerned this could represent retained distal CBD stone or stones.  IMPRESSION: Slightly distended common hepatic and common bile ducts, with faint passage of contrast into the duodenum, and a suspected  filling defect at the ampulla.  Distal common bile duct stone is not excluded.  Original Report Authenticated By: Elsie Stain, M.D.    Scheduled Meds:    . amLODipine  5 mg Oral Daily  . antiseptic oral rinse  15 mL Mouth Rinse q12n4p  . atenolol  50 mg Oral Daily  . atorvastatin  10 mg Oral q1800  . cefTRIAXone (ROCEPHIN)  IV  1 g Intravenous Q24H  . chlorhexidine  15 mL Mouth Rinse BID  . cholecalciferol  1,000 Units Oral Daily  . ciprofloxacin  400 mg Intravenous On Call to OR  . diclofenac  1 patch Transdermal BID  . doxazosin  8 mg Oral QHS  . irbesartan  300 mg Oral Daily  . DISCONTD: aspirin  81 mg Oral Daily   Continuous Infusions:    . sodium chloride 20 mL/hr (10/29/11 1509)  . lactated ringers 50 mL/hr at 10/30/11 1541

## 2011-10-30 NOTE — Transfer of Care (Signed)
Immediate Anesthesia Transfer of Care Note  Patient: [name]  Procedure(s) Performed: Procedure(s) (LRB): LAPAROSCOPIC CHOLECYSTECTOMY WITH INTRAOPERATIVE CHOLANGIOGRAM (N/A)  Patient Location: PACU  Anesthesia Type: General  Level of Consciousness: awake  Airway & Oxygen Therapy: Patient Spontanous Breathing and Patient connected to nasal cannula oxygen  Post-op Assessment: Report given to PACU RN and Post -op Vital signs reviewed and stable  Post vital signs: Reviewed and stable  Complications: No apparent anesthesia complications

## 2011-10-30 NOTE — Interval H&P Note (Signed)
History and Physical Interval Note:  10/30/2011 8:24 AM  [name]  has presented today for surgery, with the diagnosis of biliary pancreatitis  The various methods of treatment have been discussed with the patient and family. After consideration of risks, benefits and other options for treatment, the patient has consented to  Procedure(s) (LRB): LAPAROSCOPIC CHOLECYSTECTOMY WITH INTRAOPERATIVE CHOLANGIOGRAM (N/A) as a surgical intervention .  The patients' history has been reviewed, patient examined, no change in status, stable for surgery.  I have reviewed the patients' chart and labs.  Questions were answered to the patient's satisfaction.   I discussed the procedure in detail. We discussed the risks and benefits of a laparoscopic cholecystectomy and possible cholangiogram including, but not limited to bleeding, infection, injury to surrounding structures such as the intestine or liver, bile leak, retained gallstones, need to convert to an open procedure, prolonged diarrhea, blood clots such as  DVT, common bile duct injury, anesthesia risks, and possible need for additional procedures.  The likelihood of improvement in symptoms and return to the patient's normal status is good. We discussed the typical post-operative recovery course.   [name]

## 2011-10-30 NOTE — Preoperative (Signed)
Beta Blockers   Reason not to administer Beta Blockers:50mg  Atenolol PO with sip H2O at 1010hrs 10/30/11

## 2011-10-30 NOTE — H&P (Signed)
[name] is an 78 y.o. female.  HPI: Pt is a 78 yr old female with 24 hour history of abdominal pain. She presented to the [facility] for evaluation. In the ER she developed nausea and had dry heaves. She has had a similar episode several weeks ago that resolved very quickly. Her evaluation showed that she had pancreatitis with lipase of 1984. Ultrasound showed gallbladder distention with sludge and probable small stones but no evidence of cholecystitis. She was admitted and we are asked to see her for possible gallstone pancreatitis.  Past Medical History   Diagnosis  Date   .  Hypertension    .  PAD (peripheral artery disease)    .  High cholesterol    .  Arthritis     Past Surgical History   Procedure  Date   .  Total hip arthroplasty    .  Total knee arthroplasty     History reviewed. No pertinent family history.  Social History: reports that she has never smoked. She does not have any smokeless tobacco history on file. She reports that she does not drink alcohol or use illicit drugs.  Allergies:  Allergies   Allergen  Reactions   .  Penicillins    .  Sulfa Antibiotics     Medications: I have reviewed the patient's current medications.  Results for orders placed during the hospital encounter of 10/27/11 (from the past 48 hour(s))   CBC Status: Normal    Collection Time    10/27/11 10:03 PM   Component  Value  Range  Comment    WBC  9.3  4.0 - 10.5 K/uL     RBC  3.94  3.87 - 5.11 MIL/uL     Hemoglobin  13.0  12.0 - 15.0 g/dL     HCT  37.5  36.0 - 46.0 %     MCV  95.2  78.0 - 100.0 fL     MCH  33.0  26.0 - 34.0 pg     MCHC  34.7  30.0 - 36.0 g/dL     RDW  11.8  11.5 - 15.5 %     Platelets  197  150 - 400 K/uL    DIFFERENTIAL Status: Normal    Collection Time    10/27/11 10:03 PM   Component  Value  Range  Comment    Neutrophils Relative  72  43 - 77 %     Neutro Abs  6.7  1.7 - 7.7 K/uL     Lymphocytes Relative  19  12 - 46 %     Lymphs Abs  1.8  0.7 - 4.0 K/uL     Monocytes Relative  7  3 - 12 %     Monocytes Absolute  0.7  0.1 - 1.0 K/uL     Eosinophils Relative  2  0 - 5 %     Eosinophils Absolute  0.1  0.0 - 0.7 K/uL     Basophils Relative  0  0 - 1 %     Basophils Absolute  0.0  0.0 - 0.1 K/uL    COMPREHENSIVE METABOLIC PANEL Status: Abnormal    Collection Time    10/27/11 10:03 PM   Component  Value  Range  Comment    Sodium  137  135 - 145 mEq/L     Potassium  4.1  3.5 - 5.1 mEq/L     Chloride  102  96 - 112 mEq/L       CO2  27  19 - 32 mEq/L     Glucose, Bld  121 (*)  70 - 99 mg/dL     BUN  22  6 - 23 mg/dL     Creatinine, Ser  1.30 (*)  0.50 - 1.10 mg/dL     Calcium  9.6  8.4 - 10.5 mg/dL     Total Protein  6.7  6.0 - 8.3 g/dL     Albumin  3.9  3.5 - 5.2 g/dL     AST  52 (*)  0 - 37 U/L     ALT  25  0 - 35 U/L     Alkaline Phosphatase  78  39 - 117 U/L     Total Bilirubin  0.9  0.3 - 1.2 mg/dL     GFR calc non Af Amer  38 (*)  >90 mL/min     GFR calc Af Amer  44 (*)  >90 mL/min    LIPASE, BLOOD Status: Abnormal    Collection Time    10/27/11 10:03 PM   Component  Value  Range  Comment    Lipase  1984 (*)  11 - 59 U/L    URINALYSIS, ROUTINE W REFLEX MICROSCOPIC Status: Abnormal    Collection Time    10/27/11 10:04 PM   Component  Value  Range  Comment    Color, Urine  YELLOW  YELLOW     APPearance  TURBID (*)  CLEAR     Specific Gravity, Urine  1.018  1.005 - 1.030     pH  8.0  5.0 - 8.0     Glucose, UA  NEGATIVE  NEGATIVE mg/dL     Hgb urine dipstick  SMALL (*)  NEGATIVE     Bilirubin Urine  NEGATIVE  NEGATIVE     Ketones, ur  NEGATIVE  NEGATIVE mg/dL     Protein, ur  30 (*)  NEGATIVE mg/dL     Urobilinogen, UA  0.2  0.0 - 1.0 mg/dL     Nitrite  POSITIVE (*)  NEGATIVE     Leukocytes, UA  LARGE (*)  NEGATIVE    URINE MICROSCOPIC-ADD ON Status: Abnormal    Collection Time    10/27/11 10:04 PM   Component  Value  Range  Comment    Squamous Epithelial / LPF  FEW (*)  RARE     WBC, UA  TOO NUMEROUS TO COUNT  <3 WBC/hpf     RBC  / HPF  3-6  <3 RBC/hpf     Bacteria, UA  MANY (*)  RARE    CBC Status: Abnormal    Collection Time    10/28/11 3:58 AM   Component  Value  Range  Comment    WBC  9.5  4.0 - 10.5 K/uL     RBC  3.47 (*)  3.87 - 5.11 MIL/uL     Hemoglobin  11.0 (*)  12.0 - 15.0 g/dL     HCT  32.8 (*)  36.0 - 46.0 %     MCV  94.5  78.0 - 100.0 fL     MCH  31.7  26.0 - 34.0 pg     MCHC  33.5  30.0 - 36.0 g/dL     RDW  12.3  11.5 - 15.5 %     Platelets  162  150 - 400 K/uL    BASIC METABOLIC PANEL Status: Abnormal    Collection Time    10/28/11 3:58 AM     Component  Value  Range  Comment    Sodium  138  135 - 145 mEq/L     Potassium  3.7  3.5 - 5.1 mEq/L     Chloride  106  96 - 112 mEq/L     CO2  22  19 - 32 mEq/L     Glucose, Bld  165 (*)  70 - 99 mg/dL     BUN  19  6 - 23 mg/dL     Creatinine, Ser  1.04  0.50 - 1.10 mg/dL     Calcium  8.5  8.4 - 10.5 mg/dL     GFR calc non Af Amer  50 (*)  >90 mL/min     GFR calc Af Amer  58 (*)  >90 mL/min     Us Abdomen Complete  10/27/2011 *RADIOLOGY REPORT* Clinical Data: Right upper quadrant pain. Hypertension and high cholesterol. COMPLETE ABDOMINAL ULTRASOUND Comparison: None. Findings: Gallbladder: The gallbladder is distended. There is sludge and probable small stones within. No wall thickening or pericholecystic fluid. Sonographic Murphy's sign was not elicited. Common bile duct: Normal, 2 mm. Liver: Negative IVC: Poorly visualized due to overlying bowel gas. Pancreas: Poorly visualized due to overlying bowel gas. Spleen: Normal in size and echogenicity. Right Kidney: 11.0 cm. Probable minimally complex cyst in the interpolar right kidney. 3.4 cm. Left Kidney: 9.4 cm. No hydronephrosis. Renal cortical thinning is mild. Abdominal aorta: Partially obscured by bowel gas. No aneurysm or ascites. IMPRESSION: 1. Gallbladder sludge and probable stones. Marked gallbladder distention, without specific evidence of acute cholecystitis. 2. No other explanation for pain. 3.  Portions of anatomy obscured by overlying bowel gas. Original Report Authenticated By: KYLE D. TALBOT, M.D.   Review of Systems  Constitutional: Positive for malaise/fatigue. Negative for fever and chills.  HENT: Negative for hearing loss and congestion.  Eyes: Negative for blurred vision and double vision.  Respiratory: Negative for cough and shortness of breath.  Cardiovascular: Negative for chest pain and palpitations.  Gastrointestinal: Positive for nausea, vomiting (In ER last night) and abdominal pain.  Genitourinary: Negative for dysuria, urgency and frequency.  Musculoskeletal: Positive for back pain and joint pain.  Skin: Negative for rash.  Neurological: Positive for weakness. Negative for dizziness, tingling, focal weakness and headaches.  Endo/Heme/Allergies: Bruises/bleeds easily (on aspirin).  Psychiatric/Behavioral: Negative for depression. The patient is not nervous/anxious.   Blood pressure 137/58, pulse 81, temperature 99.1 F (37.3 C), temperature source Oral, resp. rate 17, height 5' 3" (1.6 m), weight 165 lb (74.844 kg), SpO2 98.00%.  Physical Exam  Constitutional: She is oriented to person, place, and time. She appears well-developed and well-nourished. No distress.  HENT:  Head: Normocephalic and atraumatic.  Eyes: EOM are normal. Pupils are equal, round, and reactive to light.  Neck: Normal range of motion. Neck supple.  Right carotid bruit (followed by primary care)  Cardiovascular: Normal rate and regular rhythm.  Murmur (Soft murmur) heard.  Respiratory: Effort normal and breath sounds normal.  GI: Bowel sounds are normal. She exhibits distension (Mildly distended). There is tenderness (Especially in the epigastric and LUQ). There is no rebound.  Musculoskeletal: She exhibits no edema and no tenderness.  Some joint stiffness  Neurological: She is alert and oriented to person, place, and time.  Skin: Skin is warm and dry.  Psychiatric: She has a normal mood  and affect. Her behavior is normal. Thought content normal.   Assessment/Plan:  1. Pancreatitis: possibly gallstone pancreatitis given ultrasound findings but no evidence   of retained stone. Patient clinically feels better today, no lipase today. Possible history of similar pain several weeks ago. May need cholecystectomy on this admission but will need for pancreatitis to resolve before proceeding. Keep on sips of clears only and hydrate with IVFs for now. Will have Dr. Tashawn Laswell see and evaluate the patient later today and make further recommendations.  WHITE, ELIZABETH  10/28/2011, 9:49 AM   

## 2011-10-30 NOTE — Anesthesia Postprocedure Evaluation (Signed)
  Anesthesia Post-op Note  Patient: [name]  Procedure(s) Performed: Procedure(s) (LRB): LAPAROSCOPIC CHOLECYSTECTOMY WITH INTRAOPERATIVE CHOLANGIOGRAM (N/A)  Patient Location: PACU  Anesthesia Type: General  Level of Consciousness: awake  Airway and Oxygen Therapy: Patient Spontanous Breathing  Post-op Pain: mild  Post-op Assessment: Post-op Vital signs reviewed, Patient's Cardiovascular Status Stable, Respiratory Function Stable, Patent Airway, No signs of Nausea or vomiting and Pain level controlled  Post-op Vital Signs: stable  Complications: No apparent anesthesia complications

## 2011-10-30 NOTE — Progress Notes (Signed)
  Subjective: R neck pain and spasm, no abdominal pain  Objective: Vital signs in last 24 hours: Temp:  [98.4 F (36.9 C)-99.1 F (37.3 C)] 98.4 F (36.9 C) (06/14 0536) Pulse Rate:  [62-68] 62  (06/14 0536) Resp:  [16-20] 16  (06/14 0536) BP: (139-153)/(59-63) 153/63 mmHg (06/14 0536) SpO2:  [91 %-97 %] 93 % (06/14 0536) Last BM Date: 10/27/11  Intake/Output from previous day: 06/13 0701 - 06/14 0700 In: 1658.3 [P.O.:720; I.V.:938.3] Out: 1200 [Urine:1200] Intake/Output this shift: Total I/O In: -  Out: 300 [Urine:300]  General appearance: alert and cooperative Neck: some R lateral muscular tenderness Resp: clear to auscultation bilaterally Cardio: regular rate and rhythm GI: soft, NT  Lab Results:   [facility] 10/29/11 0655 10/28/11 0358  WBC 6.0 9.5  HGB 11.4* 11.0*  HCT 34.3* 32.8*  PLT 156 162   BMET  Basename 10/28/11 0358 10/27/11 2203  NA 138 137  K 3.7 4.1  CL 106 102  CO2 22 27  GLUCOSE 165* 121*  BUN 19 22  CREATININE 1.04 1.30*  CALCIUM 8.5 9.6   PT/INR No results found for this basename: LABPROT:2,INR:2 in the last 72 hours ABG No results found for this basename: PHART:2,PCO2:2,PO2:2,HCO3:2 in the last 72 hours  Studies/Results: No results found.  Anti-infectives: Anti-infectives     Start     Dose/Rate Route Frequency Ordered Stop   10/30/11 0600   ciprofloxacin (CIPRO) IVPB 400 mg        400 mg 200 mL/hr over 60 Minutes Intravenous On call to O.R. 10/29/11 1044 10/31/11 0559   10/28/11 2200   cefTRIAXone (ROCEPHIN) 1 g in dextrose 5 % 50 mL IVPB        1 g 100 mL/hr over 30 Minutes Intravenous Every 24 hours 10/28/11 0341     10/28/11 0000   cefTRIAXone (ROCEPHIN) 1 g in dextrose 5 % 50 mL IVPB        1 g 100 mL/hr over 30 Minutes Intravenous  Once 10/27/11 2353 10/28/11 0033          Assessment/Plan: s/p Procedure(s) (LRB): LAPAROSCOPIC CHOLECYSTECTOMY WITH INTRAOPERATIVE CHOLANGIOGRAM (N/A) Biliary pancreatitis - for  lap chole/IOC today - see updated interval H&P R neck spasm - try MM relaxers post-op CV - 2d echo results reviewed with patient  LOS: 3 days    Zhane Donlan E 10/30/2011

## 2011-10-30 NOTE — Op Note (Signed)
10/27/2011 - 10/30/2011  11:35 AM    PATIENT:  [name]  78 y.o. female  PRE-OPERATIVE DIAGNOSIS:  biliary pancreatitis  POST-OPERATIVE DIAGNOSIS:  biliary pancreatitis  PROCEDURE:  Procedure(s): LAPAROSCOPIC CHOLECYSTECTOMY WITH INTRAOPERATIVE CHOLANGIOGRAM  SURGEON:  Surgeon(s): Liz Malady, MD  PHYSICIAN ASSISTANT:   ASSISTANTS: Rachel Rutfield, PAS   ANESTHESIA:   local and general  EBL:  Total I/O In: 450 [I.V.:450] Out: 300 [Urine:300]  BLOOD ADMINISTERED:none  DRAINS: none   SPECIMEN:  Excision  DISPOSITION OF SPECIMEN:  PATHOLOGY  COUNTS:  YES  DICTATION: Reubin Milan Dictation patient has biliary pancreatitis. Pancreatic laboratory levels have normalized. Symptoms have improved significantly. She is brought for cholecystectomy and cholangiogram. Informed consent was obtained. She was identified in the preop holding area. She received intravenous antibiotics. She is brought to the operating room and general endotracheal anesthesia was administered by the anesthesia staff. Her abdomen was prepped and draped in a sterile fashion. Time out procedure was done. Supraumbilical region was infiltrated with quarter percent Marcaine. Supraumbilical incision was made. Subcutaneous tissues were dissected down revealing the anterior fascia. This was divided along the midline and the peritoneal cavity was entered under direct vision. 0 Vicryl purse string suture was placed around the fascial opening. Hassan trocar was inserted into the abdomen and abdomen was insufflated with carbon dioxide. Under direct vision a 5 mm epigastric and 2-5 mm right lateral ports were placed. Local was injected at each port site. Dome the gallbladder was retracted superior medially and the infundibulum was retracted inferior laterally. Dissection began laterally and progressed medially easily identifying the cystic duct. Dissection continued until critical view was obtained between the cystic duct the  infundibulum and the liver. Once excellent visualization was obtained a clip was placed on the infundibular cystic duct junction. A small nick was made in the cystic duct. Cook cholangiocatheter was inserted. Intraoperative cholangiogram revealed dilated common bile duct and a filling defect distally near the ampulla. Contrast did go by into the duodenum. Attempted to flush this through but was unable to clear the filling defect. Classroom catheter was removed, 3 clips were placed proximally on the cystic duct was divided. Further dissection revealed the anterior branch of the cystic artery. This was clipped twice proximally once distally and divided. The gallbladder was taken off the liver bed with Bovie cautery. Along the way we encountered a posterior branch of the cystic artery which was clipped twice proximally and divided distally with cautery. Gallbladder was removed completely and placed in an Endo Catch bag. Was removed from the abdomen via the supraumbilical port site. Liver bed was then copiously irrigated. Hemostasis was ensured with cautery. Clips all remain in good position. There was no bleeding. Irrigation fluid was evacuated and was clear. Ports were removed under direct vision. Pneumoperitoneum was released. Supra-and local fascia was closed by tying the 0 Vicryl pursestring suture. All 4 wounds were copiously irrigated and the skin of each was closed with running 4 Vicryl followed by Dermabond. All counts were correct. Patient tolerated procedure well without apparent complication and was taken recovery in stable condition. I spoke with Dr. Dulce Sellar from gastroenterology to evaluate the patient for possible ERCP.  PATIENT DISPOSITION:  PACU - hemodynamically stable.   Delay start of Pharmacological VTE agent (>24hrs) due to surgical blood loss or risk of bleeding:  no  Violeta Gelinas, MD, MPH, FACS Pager: [phone]  6/14/201311:35 AM

## 2011-10-30 NOTE — Anesthesia Preprocedure Evaluation (Signed)
Anesthesia Evaluation  Patient identified by MRN, date of birth, ID band Patient awake    Reviewed: Allergy & Precautions, H&P , NPO status , Patient's Chart, lab work & pertinent test results  Airway Mallampati: I TM Distance: >3 FB Neck ROM: Full    Dental   Pulmonary    Pulmonary exam normal       Cardiovascular hypertension, + Peripheral Vascular Disease     Neuro/Psych    GI/Hepatic pancreatitis   Endo/Other    Renal/GU      Musculoskeletal   Abdominal   Peds  Hematology   Anesthesia Other Findings   Reproductive/Obstetrics                           Anesthesia Physical Anesthesia Plan  ASA: II  Anesthesia Plan: General   Post-op Pain Management:    Induction: Intravenous  Airway Management Planned: Oral ETT  Additional Equipment:   Intra-op Plan:   Post-operative Plan: Extubation in OR  Informed Consent: I have reviewed the patients History and Physical, chart, labs and discussed the procedure including the risks, benefits and alternatives for the proposed anesthesia with the patient or authorized representative who has indicated his/her understanding and acceptance.     Plan Discussed with: CRNA and Surgeon  Anesthesia Plan Comments:         Anesthesia Quick Evaluation

## 2011-10-30 NOTE — Progress Notes (Signed)
Subjective: Some post-operative pain. Some sore throat  Objective: Vital signs in last 24 hours: Temp:  [96.9 F (36.1 C)-98.4 F (36.9 C)] 98 F (36.7 C) (06/14 1407) Pulse Rate:  [49-85] 78  (06/14 1407) Resp:  [12-22] 16  (06/14 1407) BP: (148-165)/(49-74) 155/50 mmHg (06/14 1407) SpO2:  [91 %-99 %] 92 % (06/14 1407) Weight change:  Last BM Date: 10/27/11  PE: GEN:  NAD ABD:  Laparoscopic sites CDI; mild post-operative soreness; bowel sounds present but hypoactive  Lab Results: Results for orders placed during the hospital encounter of 10/27/11 (from the past 48 hour(s))  LIPASE, BLOOD     Status: Normal   Collection Time   10/29/11  6:55 AM      Component Value Range Comment   Lipase 54  11 - 59 U/L   HEPATIC FUNCTION PANEL     Status: Abnormal   Collection Time   10/29/11  6:55 AM      Component Value Range Comment   Total Protein 5.7 (*) 6.0 - 8.3 g/dL    Albumin 2.9 (*) 3.5 - 5.2 g/dL    AST 469 (*) 0 - 37 U/L    ALT 181 (*) 0 - 35 U/L    Alkaline Phosphatase 103  39 - 117 U/L    Total Bilirubin 1.1  0.3 - 1.2 mg/dL    Bilirubin, Direct 0.7 (*) 0.0 - 0.3 mg/dL    Indirect Bilirubin 0.4  0.3 - 0.9 mg/dL   CBC     Status: Abnormal   Collection Time   10/29/11  6:55 AM      Component Value Range Comment   WBC 6.0  4.0 - 10.5 K/uL    RBC 3.46 (*) 3.87 - 5.11 MIL/uL    Hemoglobin 11.4 (*) 12.0 - 15.0 g/dL    HCT 62.9 (*) 52.8 - 46.0 %    MCV 99.1  78.0 - 100.0 fL    MCH 32.9  26.0 - 34.0 pg    MCHC 33.2  30.0 - 36.0 g/dL    RDW 41.3  24.4 - 01.0 %    Platelets 156  150 - 400 K/uL   AMYLASE     Status: Normal   Collection Time   10/29/11  6:55 AM      Component Value Range Comment   Amylase 98  0 - 105 U/L   SURGICAL PCR SCREEN     Status: Normal   Collection Time   10/30/11  5:37 AM      Component Value Range Comment   MRSA, PCR NEGATIVE  NEGATIVE    Staphylococcus aureus NEGATIVE  NEGATIVE     Studies/Results: Dg Cholangiogram Operative  10/30/2011   *RADIOLOGY REPORT*  Clinical Data:   Gallstone pancreatitis.  INTRAOPERATIVE CHOLANGIOGRAM  Technique:  Cholangiographic images from the C-arm fluoroscopic device were submitted for interpretation post-operatively.  Please see the procedural report for the amount of contrast and the fluoroscopy time utilized.  Comparison:  Ultrasound of the abdomen 10/27/2011  Findings:  The gallbladder has been removed and the cystic duct cannulated.  Common hepatic and common bile ducts appear mildly dilated.  There is only faint passage of contrast into the duodenum.  At the level of the ampulla, there is a filling defect which persists on all three series of images.  I am concerned this could represent retained distal CBD stone or stones.  IMPRESSION: Slightly distended common hepatic and common bile ducts, with faint passage of contrast into the  duodenum, and a suspected  filling defect at the ampulla.  Distal common bile duct stone is not excluded.  Original Report Authenticated By: Elsie Stain, M.D.   Assessment:  1.  Gallstone pancreatitis, resolved. 2.  Gallstones + sludge, post cholecystectomy today. 3.  Intraoperative cholangiogram suggestive of retained bile duct stone(s).  Plan:  1.  Needs ERCP with biliary sphincterotomy and stone extraction. 2.  Patient would be best served sedation via general anesthesia, and thus we will arrange for Monday morning. 3.  Risks (up to and including bleeding, infection, perforation, pancreatitis that can be complicated by infected necrosis and death), benefits (removal of stones, alleviating blockage, decreasing risk of cholangitis or choledocholithiasis-related pancreatitis), and alternatives (watchful waiting, percutaneous transhepatic cholangiography) of ERCP were explained to patient/family in detail and patient elects to proceed.   Freddy Jaksch 10/30/2011, 3:05 PM

## 2011-10-31 DIAGNOSIS — R1011 Right upper quadrant pain: Secondary | ICD-10-CM | POA: Diagnosis not present

## 2011-10-31 DIAGNOSIS — E782 Mixed hyperlipidemia: Secondary | ICD-10-CM

## 2011-10-31 DIAGNOSIS — K8050 Calculus of bile duct without cholangitis or cholecystitis without obstruction: Secondary | ICD-10-CM | POA: Diagnosis not present

## 2011-10-31 DIAGNOSIS — K859 Acute pancreatitis without necrosis or infection, unspecified: Secondary | ICD-10-CM

## 2011-10-31 DIAGNOSIS — K8020 Calculus of gallbladder without cholecystitis without obstruction: Secondary | ICD-10-CM

## 2011-10-31 LAB — CBC
HCT: 32.9 % — ABNORMAL LOW (ref 36.0–46.0)
MCHC: 32.5 g/dL (ref 30.0–36.0)
MCV: 97.3 fL (ref 78.0–100.0)
Platelets: 140 10*3/uL — ABNORMAL LOW (ref 150–400)
RDW: 12.4 % (ref 11.5–15.5)
WBC: 8.1 10*3/uL (ref 4.0–10.5)

## 2011-10-31 LAB — COMPREHENSIVE METABOLIC PANEL
ALT: 101 U/L — ABNORMAL HIGH (ref 0–35)
Albumin: 2.7 g/dL — ABNORMAL LOW (ref 3.5–5.2)
Alkaline Phosphatase: 97 U/L (ref 39–117)
BUN: 11 mg/dL (ref 6–23)
Chloride: 105 mEq/L (ref 96–112)
Glucose, Bld: 100 mg/dL — ABNORMAL HIGH (ref 70–99)
Potassium: 4.3 mEq/L (ref 3.5–5.1)
Sodium: 138 mEq/L (ref 135–145)
Total Bilirubin: 0.5 mg/dL (ref 0.3–1.2)
Total Protein: 5.1 g/dL — ABNORMAL LOW (ref 6.0–8.3)

## 2011-10-31 NOTE — Progress Notes (Signed)
1 Day Post-Op  Subjective: "sore"  Objective: Vital signs in last 24 hours: Temp:  [96.9 F (36.1 C)-98.9 F (37.2 C)] 97.6 F (36.4 C) (06/15 0647) Pulse Rate:  [49-96] 72  (06/15 0647) Resp:  [12-22] 16  (06/15 0647) BP: (138-169)/(48-74) 138/52 mmHg (06/15 0647) SpO2:  [92 %-99 %] 92 % (06/15 0647) Last BM Date: 10/27/11  Intake/Output from previous day: 06/14 0701 - 06/15 0700 In: 1776.7 [P.O.:120; I.V.:1656.7] Out: 1150 [Urine:1150] Intake/Output this shift:    General appearance: alert, cooperative and no distress Resp: clear to auscultation bilaterally Cardio: normal rate GI: soft, appropriate tenderness, ND, incisions without infection  Lab Results:   [facility] 10/31/11 0605 10/29/11 0655  WBC 8.1 6.0  HGB 10.7* 11.4*  HCT 32.9* 34.3*  PLT 140* 156   BMET  Basename 10/31/11 0605  NA 138  K 4.3  CL 105  CO2 26  GLUCOSE 100*  BUN 11  CREATININE 1.03  CALCIUM 8.6   PT/INR No results found for this basename: LABPROT:2,INR:2 in the last 72 hours ABG No results found for this basename: PHART:2,PCO2:2,PO2:2,HCO3:2 in the last 72 hours  Studies/Results: Dg Cholangiogram Operative  10/30/2011  *RADIOLOGY REPORT*  Clinical Data:   Gallstone pancreatitis.  INTRAOPERATIVE CHOLANGIOGRAM  Technique:  Cholangiographic images from the C-arm fluoroscopic device were submitted for interpretation post-operatively.  Please see the procedural report for the amount of contrast and the fluoroscopy time utilized.  Comparison:  Ultrasound of the abdomen 10/27/2011  Findings:  The gallbladder has been removed and the cystic duct cannulated.  Common hepatic and common bile ducts appear mildly dilated.  There is only faint passage of contrast into the duodenum.  At the level of the ampulla, there is a filling defect which persists on all three series of images.  I am concerned this could represent retained distal CBD stone or stones.  IMPRESSION: Slightly distended common hepatic  and common bile ducts, with faint passage of contrast into the duodenum, and a suspected  filling defect at the ampulla.  Distal common bile duct stone is not excluded.  Original Report Authenticated By: Elsie Stain, M.D.    Anti-infectives: Anti-infectives     Start     Dose/Rate Route Frequency Ordered Stop   10/30/11 0600   ciprofloxacin (CIPRO) IVPB 400 mg  Status:  Discontinued        400 mg 200 mL/hr over 60 Minutes Intravenous On call to O.R. 10/29/11 1044 10/30/11 1702   10/28/11 2200   cefTRIAXone (ROCEPHIN) 1 g in dextrose 5 % 50 mL IVPB        1 g 100 mL/hr over 30 Minutes Intravenous Every 24 hours 10/28/11 0341     10/28/11 0000   cefTRIAXone (ROCEPHIN) 1 g in dextrose 5 % 50 mL IVPB        1 g 100 mL/hr over 30 Minutes Intravenous  Once 10/27/11 2353 10/28/11 0033          Assessment/Plan: s/p Procedure(s) (LRB): LAPAROSCOPIC CHOLECYSTECTOMY WITH INTRAOPERATIVE CHOLANGIOGRAM (N/A) seems appropriate postop.  awaiting ERCP for CBD stone.  LFT's better  LOS: 4 days    Lodema Pilot DAVID 10/31/2011

## 2011-10-31 NOTE — Progress Notes (Signed)
[facility] PROGRESS NOTE  [name] WUJ:[id number] DOB: [birth year] DOA: 10/27/2011 PCP: Pearson Grippe, MD  Assessment/Plan: 1. Gallstone pancreatitis - . Decreased pain and nausea. No further vomiting. Tolerating clear liquids. Exam with some mild RUQ tenderness. Lap cholecystectomy on 10/30/11. IOC reveals CBD stone - plan for ERCP 11/01/11 2. Proteus UTI - afebrile, improving - c/w iv rocephin 3. HTN- acceptable control. Continue home meds. 4. Systolic murmur - 2 d echo reveals mild to moderate increase in PAP , maybe murmur is related to TR . Not hemodynamically significant.  According to patient she had an outpatient stress test and cardiac cath by Dr. Aleen Campi years ago - requested records from office. 5. right shoulder pain s/p arthroplasty- local rx  Principal Problem:  *Pancreatitis Active Problems:  Cholelithiasis  UTI (urinary tract infection)  Hypertension  Hypercholesterolemia  PVD (peripheral vascular disease)  Choledocholithiasis with obstruction  Code Status: full Family Communication: husband [name], [name] [phone] Disposition Plan: home  Habib Kise, MD  [facility] Pager [phone]  If 7PM-7AM, please contact night-coverage [email or web address] Password [email or web address] 10/31/2011, 9:25 AM   LOS: 4 days   Brief narrative: 78 yo woman admitted with pancreatitis  Consultants:  [facility]  Procedures:  Abd [place]  Antibiotics:  Rocephin 6/11  HPI/Subjective:  A little bit of abdominal soreness No nausea no vomiting, passing flatus  Objective: Filed Vitals:   10/30/11 1820 10/30/11 2151 10/31/11 0202 10/31/11 0647  BP: 169/73 148/48 142/57 138/52  Pulse: 96 60 72 72  Temp: 97.6 F (36.4 C) 98.8 F (37.1 C) 98.9 F (37.2 C) 97.6 F (36.4 C)  TempSrc: Oral Oral Oral Oral  Resp: 18 18 16 16   Height:      Weight:      SpO2: 94% 93% 92% 92%    Intake/Output Summary (Last 24 hours) at 10/31/11 0925 Last data filed at  10/31/11 0600  Gross per 24 hour  Intake 1776.67 ml  Output    850 ml  Net 926.67 ml    Exam:   General:  axox3  Cardiovascular: RRR, 1/6 systolic murmur  Respiratory: CTAB  Abdomen: soft, minimal RUQ tenderness, audible BS  Data Reviewed: Basic Metabolic Panel:  Lab 10/31/11 9528 10/28/11 0358 10/27/11 2203  NA 138 138 137  K 4.3 3.7 4.1  CL 105 106 102  CO2 26 22 27   GLUCOSE 100* 165* 121*  BUN 11 19 22   CREATININE 1.03 1.04 1.30*  CALCIUM 8.6 8.5 9.6  MG -- -- --  PHOS -- -- --   Liver Function Tests:  Lab 10/31/11 0605 10/29/11 0655 10/27/11 2203  AST 47* 137* 52*  ALT 101* 181* 25  ALKPHOS 97 103 78  BILITOT 0.5 1.1 0.9  PROT 5.1* 5.7* 6.7  ALBUMIN 2.7* 2.9* 3.9    Lab 10/29/11 0655 10/27/11 2203  LIPASE 54 1984*  AMYLASE 98 --   No results found for this basename: AMMONIA:5 in the last 168 hours CBC:  Lab 10/31/11 0605 10/29/11 0655 10/28/11 0358 10/27/11 2203  WBC 8.1 6.0 9.5 9.3  NEUTROABS -- -- -- 6.7  HGB 10.7* 11.4* 11.0* 13.0  HCT 32.9* 34.3* 32.8* 37.5  MCV 97.3 99.1 94.5 95.2  PLT 140* 156 162 197   Cardiac Enzymes: No results found for this basename: CKTOTAL:5,CKMB:5,CKMBINDEX:5,TROPONINI:5 in the last 168 hours BNP (last 3 results) No results found for this basename: PROBNP:3 in the last 8760 hours CBG: No results found for this basename: GLUCAP:5 in the last 168 hours  Recent Results (from the past 240 hour(s))  URINE CULTURE     Status: Normal   Collection Time   10/27/11 10:04 PM      Component Value Range Status Comment   Specimen Description URINE, CLEAN CATCH   Final    Special Requests NONE   Final    Culture  Setup Time [id number]   Final    Colony Count >=100,000 COLONIES/ML   Final    Culture PROTEUS MIRABILIS   Final    Report Status 10/29/2011 FINAL   Final    Organism ID, Bacteria PROTEUS MIRABILIS   Final   SURGICAL PCR SCREEN     Status: Normal   Collection Time   10/30/11  5:37 AM      Component Value  Range Status Comment   MRSA, PCR NEGATIVE  NEGATIVE Final    Staphylococcus aureus NEGATIVE  NEGATIVE Final      Studies: Dg Cholangiogram Operative  10/30/2011  *RADIOLOGY REPORT*  Clinical Data:   Gallstone pancreatitis.  INTRAOPERATIVE CHOLANGIOGRAM  Technique:  Cholangiographic images from the C-arm fluoroscopic device were submitted for interpretation post-operatively.  Please see the procedural report for the amount of contrast and the fluoroscopy time utilized.  Comparison:  Ultrasound of the abdomen 10/27/2011  Findings:  The gallbladder has been removed and the cystic duct cannulated.  Common hepatic and common bile ducts appear mildly dilated.  There is only faint passage of contrast into the duodenum.  At the level of the ampulla, there is a filling defect which persists on all three series of images.  I am concerned this could represent retained distal CBD stone or stones.  IMPRESSION: Slightly distended common hepatic and common bile ducts, with faint passage of contrast into the duodenum, and a suspected  filling defect at the ampulla.  Distal common bile duct stone is not excluded.  Original Report Authenticated By: Elsie Stain, M.D.    Scheduled Meds:    . amLODipine  5 mg Oral Daily  . antiseptic oral rinse  15 mL Mouth Rinse q12n4p  . atenolol  50 mg Oral Daily  . atorvastatin  10 mg Oral q1800  . cefTRIAXone (ROCEPHIN)  IV  1 g Intravenous Q24H  . chlorhexidine  15 mL Mouth Rinse BID  . cholecalciferol  1,000 Units Oral Daily  . diclofenac  1 patch Transdermal BID  . doxazosin  8 mg Oral QHS  . irbesartan  300 mg Oral Daily  . DISCONTD: aspirin  81 mg Oral Daily  . DISCONTD: ciprofloxacin  400 mg Intravenous On Call to OR   Continuous Infusions:    . sodium chloride 20 mL/hr (10/29/11 1509)  . lactated ringers 50 mL/hr at 10/31/11 0420

## 2011-10-31 NOTE — Progress Notes (Signed)
[facility] PROGRESS NOTE Subjective No pain. Tolerating diet pretty well.  Objective: Vital signs in last 24 hours: Temp:  [96.9 F (36.1 C)-98.9 F (37.2 C)] 97.6 F (36.4 C) (06/15 0647) Pulse Rate:  [49-96] 72  (06/15 0647) Resp:  [12-18] 16  (06/15 0647) BP: (138-169)/(48-74) 138/52 mmHg (06/15 0647) SpO2:  [92 %-99 %] 92 % (06/15 0647) Last BM Date: 10/27/11  Intake/Output from previous day: 06/14 0701 - 06/15 0700 In: 1776.7 [P.O.:120; I.V.:1656.7] Out: 1150 [Urine:1150] Intake/Output this shift:    RU:EAVWU no complaints Abd--min tenderness.  Lab Results:  [facility] 10/31/11 0605 10/29/11 0655  WBC 8.1 6.0  HGB 10.7* 11.4*  HCT 32.9* 34.3*  PLT 140* 156   BMET  Basename 10/31/11 0605  NA 138  K 4.3  CL 105  CO2 26  CREATININE 1.03   LFT  Basename 10/31/11 0605 10/29/11 0655  PROT 5.1* 5.7*  AST 47* 137*  ALT 101* 181*  ALKPHOS 97 103  BILITOT 0.5 1.1  BILIDIR -- 0.7*  IBILI -- 0.4   PT/INR No results found for this basename: LABPROT:3,INR:3 in the last 72 hours PANCREAS  Basename 10/29/11 0655  LIPASE 54         Studies/Results: Dg Cholangiogram Operative  10/30/2011  *RADIOLOGY REPORT*  Clinical Data:   Gallstone pancreatitis.  INTRAOPERATIVE CHOLANGIOGRAM  Technique:  Cholangiographic images from the C-arm fluoroscopic device were submitted for interpretation post-operatively.  Please see the procedural report for the amount of contrast and the fluoroscopy time utilized.  Comparison:  Ultrasound of the abdomen 10/27/2011  Findings:  The gallbladder has been removed and the cystic duct cannulated.  Common hepatic and common bile ducts appear mildly dilated.  There is only faint passage of contrast into the duodenum.  At the level of the ampulla, there is a filling defect which persists on all three series of images.  I am concerned this could represent retained distal CBD stone or stones.  IMPRESSION: Slightly distended common  hepatic and common bile ducts, with faint passage of contrast into the duodenum, and a suspected  filling defect at the ampulla.  Distal common bile duct stone is not excluded.  Original Report Authenticated By: Elsie Stain, M.D.    Medications: I have reviewed the patient's current medications.  Assessment/Plan: 1. CBD stone.  Pt seems to be doing well. Scheduled for ERCP with stone extraction by Dr Dulce Sellar Monday at 10:45 with anesthesia.  Discussed again with pt and husband.   Nichol Ator JR,Yael Angerer L 10/31/2011, 9:58 AM

## 2011-11-01 ENCOUNTER — Encounter (HOSPITAL_COMMUNITY): Payer: Self-pay | Admitting: Anesthesiology

## 2011-11-01 DIAGNOSIS — K8050 Calculus of bile duct without cholangitis or cholecystitis without obstruction: Secondary | ICD-10-CM | POA: Diagnosis not present

## 2011-11-01 DIAGNOSIS — R1011 Right upper quadrant pain: Secondary | ICD-10-CM | POA: Diagnosis not present

## 2011-11-01 DIAGNOSIS — K8020 Calculus of gallbladder without cholecystitis without obstruction: Secondary | ICD-10-CM

## 2011-11-01 DIAGNOSIS — K859 Acute pancreatitis without necrosis or infection, unspecified: Secondary | ICD-10-CM

## 2011-11-01 DIAGNOSIS — E782 Mixed hyperlipidemia: Secondary | ICD-10-CM

## 2011-11-01 LAB — HEPATIC FUNCTION PANEL
Bilirubin, Direct: 0.1 mg/dL (ref 0.0–0.3)
Indirect Bilirubin: 0.4 mg/dL (ref 0.3–0.9)
Total Bilirubin: 0.5 mg/dL (ref 0.3–1.2)

## 2011-11-01 MED ORDER — SENNA 8.6 MG PO TABS
1.0000 | ORAL_TABLET | Freq: Every day | ORAL | Status: DC
  Administered 2011-11-01: 8.6 mg via ORAL
  Filled 2011-11-01: qty 1

## 2011-11-01 NOTE — Progress Notes (Signed)
[facility] PROGRESS NOTE Subjective Patient feeling well without pain. As tolerated diet. Scheduled for ERCP and stone extraction tomorrow at 1045 By Dr. Dulce Sellar.  Objective: Vital signs in last 24 hours: Temp:  [98 F (36.7 C)-98.4 F (36.9 C)] 98 F (36.7 C) (06/16 0531) Pulse Rate:  [54-63] 63  (06/16 0531) Resp:  [18] 18  (06/16 0531) BP: (149-178)/(56-63) 178/56 mmHg (06/16 0531) SpO2:  [90 %-94 %] 94 % (06/16 0531) Last BM Date: 10/27/11  Intake/Output from previous day: 06/15 0701 - 06/16 0700 In: 1080 [P.O.:1080] Out: 500 [Urine:500] Intake/Output this shift:     Lab Results:  Basename 10/31/11 0605  WBC 8.1  HGB 10.7*  HCT 32.9*  PLT 140*   BMET  Basename 10/31/11 0605  NA 138  K 4.3  CL 105  CO2 26  CREATININE 1.03   LFT  Basename 11/01/11 0500 10/31/11 0605  PROT 5.1* 5.1*  AST 29 47*  ALT 72* 101*  ALKPHOS 89 97  BILITOT 0.5 0.5  BILIDIR 0.1 --  IBILI 0.4 --   PT/INR No results found for this basename: LABPROT:3,INR:3 in the last 72 hours PANCREAS No results found for this basename: LIPASE:3 in the last 72 hours       Studies/Results: No results found.  Medications: I have reviewed the patient's current medications.  Assessment/Plan: 1. Probable common bile duct stone. Filling defect in IOC. Liver tests better. Could have passed stone but stone also could be floating in the bile duct. We will go ahead with ERCP with sphincterotomy if needed. He'll this should be done prior to discharge soon stone could be still in the bile duct. Discussed this with patient. Schedule for tomorrow at 1045 with Dr. Fonnie Jarvis JR,Aleesha Ringstad L 11/01/2011, 11:06 AM

## 2011-11-01 NOTE — Progress Notes (Signed)
[facility] PROGRESS NOTE  ANTHONY ROLAND JYN:[id number] DOB: 12/16/1930 DOA: 10/27/2011 PCP: Pearson Grippe, MD  Assessment/Plan: 1. Gallstone pancreatitis - . Decreased pain and nausea. No further vomiting. Tolerating clear liquids. Exam with some mild RUQ tenderness. Lap cholecystectomy on 10/30/11. IOC reveals CBD stone - plan for ERCP 11/01/11 2. Proteus UTI - afebrile, improving - c/w iv rocephin 3. HTN- acceptable control. Continue home meds. 4. Systolic murmur - 2 d echo reveals mild to moderate increase in PAP , maybe murmur is related to TR . Not hemodynamically significant.  According to patient she had an outpatient stress test and cardiac cath by Dr. Aleen Campi years ago - requested records from office. 5. right shoulder pain s/p arthroplasty- local rx 6. Constipation - senna  Principal Problem:  *Pancreatitis Active Problems:  Cholelithiasis  UTI (urinary tract infection)  Hypertension  Hypercholesterolemia  PVD (peripheral vascular disease)  Choledocholithiasis with obstruction  Code Status: full Family Communication: husband JAZZLYN, HUIZENGA [phone] Disposition Plan: home  Jolana Runkles, MD  [facility] Pager [phone]  If 7PM-7AM, please contact night-coverage [email or web address] Password [email or web address] 11/01/2011, 2:07 PM   LOS: 5 days   Brief narrative: 78 yo woman admitted with pancreatitis, had lap chole after cool down on HOD #3 and was found to have CBd stone.   Consultants:  [facility]  [facility]  Procedures:  Abd [place]  Lap chole  Antibiotics:  Rocephin 6/11  HPI/Subjective:  C/o constipation abd pain is better   Objective: Filed Vitals:   10/31/11 0647 10/31/11 1400 10/31/11 2106 11/01/11 0531  BP: 138/52 149/63 174/60 178/56  Pulse: 72 54 61 63  Temp: 97.6 F (36.4 C) 98 F (36.7 C) 98.4 F (36.9 C) 98 F (36.7 C)  TempSrc: Oral Oral Oral Oral  Resp: 16 18 18 18   Height:      Weight:      SpO2: 92% 93% 90%  94%    Intake/Output Summary (Last 24 hours) at 11/01/11 1407 Last data filed at 11/01/11 0531  Gross per 24 hour  Intake    240 ml  Output    500 ml  Net   -260 ml    Exam:   axox3   CVS: rrr  Rs: ctab   Abdomen : distended , soft,     Data Reviewed: Basic Metabolic Panel:  Lab 10/31/11 2440 10/28/11 0358 10/27/11 2203  NA 138 138 137  K 4.3 3.7 4.1  CL 105 106 102  CO2 26 22 27   GLUCOSE 100* 165* 121*  BUN 11 19 22   CREATININE 1.03 1.04 1.30*  CALCIUM 8.6 8.5 9.6  MG -- -- --  PHOS -- -- --   Liver Function Tests:  Lab 11/01/11 0500 10/31/11 0605 10/29/11 0655 10/27/11 2203  AST 29 47* 137* 52*  ALT 72* 101* 181* 25  ALKPHOS 89 97 103 78  BILITOT 0.5 0.5 1.1 0.9  PROT 5.1* 5.1* 5.7* 6.7  ALBUMIN 2.6* 2.7* 2.9* 3.9    Lab 10/29/11 0655 10/27/11 2203  LIPASE 54 1984*  AMYLASE 98 --   No results found for this basename: AMMONIA:5 in the last 168 hours CBC:  Lab 10/31/11 0605 10/29/11 0655 10/28/11 0358 10/27/11 2203  WBC 8.1 6.0 9.5 9.3  NEUTROABS -- -- -- 6.7  HGB 10.7* 11.4* 11.0* 13.0  HCT 32.9* 34.3* 32.8* 37.5  MCV 97.3 99.1 94.5 95.2  PLT 140* 156 162 197   Cardiac Enzymes: No results found for this basename: CKTOTAL:5,CKMB:5,CKMBINDEX:5,TROPONINI:5 in  the last 168 hours BNP (last 3 results) No results found for this basename: PROBNP:3 in the last 8760 hours CBG: No results found for this basename: GLUCAP:5 in the last 168 hours  Recent Results (from the past 240 hour(s))  URINE CULTURE     Status: Normal   Collection Time   10/27/11 10:04 PM      Component Value Range Status Comment   Specimen Description URINE, CLEAN CATCH   Final    Special Requests NONE   Final    Culture  Setup Time [id number]   Final    Colony Count >=100,000 COLONIES/ML   Final    Culture PROTEUS MIRABILIS   Final    Report Status 10/29/2011 FINAL   Final    Organism ID, Bacteria PROTEUS MIRABILIS   Final   SURGICAL PCR SCREEN     Status: Normal    Collection Time   10/30/11  5:37 AM      Component Value Range Status Comment   MRSA, PCR NEGATIVE  NEGATIVE Final    Staphylococcus aureus NEGATIVE  NEGATIVE Final      Studies: No results found.  Scheduled Meds:    . amLODipine  5 mg Oral Daily  . atenolol  50 mg Oral Daily  . atorvastatin  10 mg Oral q1800  . cefTRIAXone (ROCEPHIN)  IV  1 g Intravenous Q24H  . cholecalciferol  1,000 Units Oral Daily  . diclofenac  1 patch Transdermal BID  . doxazosin  8 mg Oral QHS  . irbesartan  300 mg Oral Daily  . senna  1 tablet Oral Daily   Continuous Infusions:    . sodium chloride 20 mL/hr (10/29/11 1509)  . DISCONTD: lactated ringers 50 mL/hr at 10/31/11 [phone]

## 2011-11-01 NOTE — Progress Notes (Signed)
2 Days Post-Op  Subjective: Tolerating diet.  No new issues  Objective: Vital signs in last 24 hours: Temp:  [98 F (36.7 C)-98.4 F (36.9 C)] 98 F (36.7 C) (06/16 0531) Pulse Rate:  [54-63] 63  (06/16 0531) Resp:  [18] 18  (06/16 0531) BP: (149-178)/(56-63) 178/56 mmHg (06/16 0531) SpO2:  [90 %-94 %] 94 % (06/16 0531) Last BM Date: 10/27/11  Intake/Output from previous day: 06/15 0701 - 06/16 0700 In: 1080 [P.O.:1080] Out: 500 [Urine:500] Intake/Output this shift:    General appearance: alert, cooperative and no distress GI: soft, appropriate incisional tenderness, ND, wounds without infection, no peritoneal signs  Lab Results:   [facility] 10/31/11 0605  WBC 8.1  HGB 10.7*  HCT 32.9*  PLT 140*   BMET  Basename 10/31/11 0605  NA 138  K 4.3  CL 105  CO2 26  GLUCOSE 100*  BUN 11  CREATININE 1.03  CALCIUM 8.6   PT/INR No results found for this basename: LABPROT:2,INR:2 in the last 72 hours ABG No results found for this basename: PHART:2,PCO2:2,PO2:2,HCO3:2 in the last 72 hours  Studies/Results: Dg Cholangiogram Operative  10/30/2011  *RADIOLOGY REPORT*  Clinical Data:   Gallstone pancreatitis.  INTRAOPERATIVE CHOLANGIOGRAM  Technique:  Cholangiographic images from the C-arm fluoroscopic device were submitted for interpretation post-operatively.  Please see the procedural report for the amount of contrast and the fluoroscopy time utilized.  Comparison:  Ultrasound of the abdomen 10/27/2011  Findings:  The gallbladder has been removed and the cystic duct cannulated.  Common hepatic and common bile ducts appear mildly dilated.  There is only faint passage of contrast into the duodenum.  At the level of the ampulla, there is a filling defect which persists on all three series of images.  I am concerned this could represent retained distal CBD stone or stones.  IMPRESSION: Slightly distended common hepatic and common bile ducts, with faint passage of contrast into the  duodenum, and a suspected  filling defect at the ampulla.  Distal common bile duct stone is not excluded.  Original Report Authenticated By: Elsie Stain, M.D.    Anti-infectives: Anti-infectives     Start     Dose/Rate Route Frequency Ordered Stop   10/30/11 0600   ciprofloxacin (CIPRO) IVPB 400 mg  Status:  Discontinued        400 mg 200 mL/hr over 60 Minutes Intravenous On call to O.R. 10/29/11 1044 10/30/11 1702   10/28/11 2200   cefTRIAXone (ROCEPHIN) 1 g in dextrose 5 % 50 mL IVPB        1 g 100 mL/hr over 30 Minutes Intravenous Every 24 hours 10/28/11 0341     10/28/11 0000   cefTRIAXone (ROCEPHIN) 1 g in dextrose 5 % 50 mL IVPB        1 g 100 mL/hr over 30 Minutes Intravenous  Once 10/27/11 2353 10/28/11 0033          Assessment/Plan: s/p Procedure(s) (LRB): LAPAROSCOPIC CHOLECYSTECTOMY WITH INTRAOPERATIVE CHOLANGIOGRAM (N/A) tolerating diet.  Doing well postop.  LFT's continue to trend down.  she may have passed the CBD stones spontaneously.  GI considering ERCP tomorrow.  LOS: 5 days    [name] DAVID 11/01/2011

## 2011-11-02 ENCOUNTER — Encounter (HOSPITAL_COMMUNITY): Payer: Self-pay | Admitting: General Surgery

## 2011-11-02 ENCOUNTER — Encounter (HOSPITAL_COMMUNITY)
Admission: EM | Disposition: A | Discharge status: Home / Self Care | Payer: Self-pay | Source: Ambulatory Visit | Attending: Internal Medicine

## 2011-11-02 DIAGNOSIS — K859 Acute pancreatitis without necrosis or infection, unspecified: Secondary | ICD-10-CM | POA: Diagnosis not present

## 2011-11-02 DIAGNOSIS — K8020 Calculus of gallbladder without cholecystitis without obstruction: Secondary | ICD-10-CM

## 2011-11-02 DIAGNOSIS — R932 Abnormal findings on diagnostic imaging of liver and biliary tract: Secondary | ICD-10-CM | POA: Diagnosis not present

## 2011-11-02 DIAGNOSIS — R109 Unspecified abdominal pain: Secondary | ICD-10-CM | POA: Diagnosis not present

## 2011-11-02 DIAGNOSIS — E782 Mixed hyperlipidemia: Secondary | ICD-10-CM

## 2011-11-02 LAB — COMPREHENSIVE METABOLIC PANEL
ALT: 60 U/L — ABNORMAL HIGH (ref 0–35)
AST: 28 U/L (ref 0–37)
Albumin: 2.8 g/dL — ABNORMAL LOW (ref 3.5–5.2)
Alkaline Phosphatase: 93 U/L (ref 39–117)
Chloride: 103 mEq/L (ref 96–112)
Potassium: 3.4 mEq/L — ABNORMAL LOW (ref 3.5–5.1)
Sodium: 142 mEq/L (ref 135–145)
Total Bilirubin: 0.5 mg/dL (ref 0.3–1.2)
Total Protein: 5.5 g/dL — ABNORMAL LOW (ref 6.0–8.3)

## 2011-11-02 LAB — CBC
HCT: 34.2 % — ABNORMAL LOW (ref 36.0–46.0)
MCH: 32.7 pg (ref 26.0–34.0)
MCV: 95.5 fL (ref 78.0–100.0)
RDW: 12.4 % (ref 11.5–15.5)
WBC: 8.1 10*3/uL (ref 4.0–10.5)

## 2011-11-02 SURGERY — ERCP, WITH INTERVENTION IF INDICATED
Anesthesia: General

## 2011-11-02 NOTE — Progress Notes (Signed)
3 Days Post-Op  Subjective: Doing well, no complaints. Would like to go home.  Objective: Vital signs in last 24 hours: Temp:  [97.5 F (36.4 C)-98.6 F (37 C)] 98.6 F (37 C) (06/17 0946) Pulse Rate:  [53-83] 83  (06/17 0946) Resp:  [18-20] 18  (06/17 0946) BP: (160-177)/(52-65) 160/59 mmHg (06/17 0946) SpO2:  [94 %-96 %] 95 % (06/17 0946) Last BM Date: 10/27/11  Intake/Output from previous day: 06/16 0701 - 06/17 0700 In: 480 [P.O.:480] Out: 1700 [Urine:1700] Intake/Output this shift:    General appearance: alert, cooperative and no distress Abdomen is soft,non-tender, positive BS,no BM yet. Tolerating diet. No c/o emesis or nausea or pain. She remains afebrile, and hemodynamically stable.  Lab Results:   [facility] 11/02/11 0703 10/31/11 0605  WBC 8.1 8.1  HGB 11.7* 10.7*  HCT 34.2* 32.9*  PLT 168 140*   BMET  Basename 11/02/11 0703 10/31/11 0605  NA 142 138  K 3.4* 4.3  CL 103 105  CO2 28 26  GLUCOSE 86 100*  BUN 8 11  CREATININE 0.84 1.03  CALCIUM 8.8 8.6   PT/INR No results found for this basename: LABPROT:2,INR:2 in the last 72 hours ABG No results found for this basename: PHART:2,PCO2:2,PO2:2,HCO3:2 in the last 72 hours  Studies/Results: No results found.  Anti-infectives: Anti-infectives     Start     Dose/Rate Route Frequency Ordered Stop   10/30/11 0600   ciprofloxacin (CIPRO) IVPB 400 mg  Status:  Discontinued        400 mg 200 mL/hr over 60 Minutes Intravenous On call to O.R. 10/29/11 1044 10/30/11 1702   10/28/11 2200   cefTRIAXone (ROCEPHIN) 1 g in dextrose 5 % 50 mL IVPB        1 g 100 mL/hr over 30 Minutes Intravenous Every 24 hours 10/28/11 0341     10/28/11 0000   cefTRIAXone (ROCEPHIN) 1 g in dextrose 5 % 50 mL IVPB        1 g 100 mL/hr over 30 Minutes Intravenous  Once 10/27/11 2353 10/28/11 0033          Assessment/Plan: s/p Procedure(s) (LRB): LAPAROSCOPIC CHOLECYSTECTOMY WITH INTRAOPERATIVE CHOLANGIOGRAM (N/A) 1. Okay  to discharge to home from surgery standpoint, she should f/u with Dr. Janee Morn in 3 weeks time.  LOS: 6 days    [name] 11/02/2011

## 2011-11-02 NOTE — Anesthesia Preprocedure Evaluation (Addendum)
Anesthesia Evaluation  Patient identified by MRN, date of birth, ID band Patient awake    Reviewed: Allergy & Precautions, H&P , NPO status , Patient's Chart, lab work & pertinent test results  Airway Mallampati: II  Neck ROM: full    Dental   Pulmonary          Cardiovascular hypertension, Pt. on medications + Peripheral Vascular Disease     Neuro/Psych    GI/Hepatic   Endo/Other    Renal/GU      Musculoskeletal  (+) Arthritis -,   Abdominal   Peds  Hematology   Anesthesia Other Findings   Reproductive/Obstetrics                          Anesthesia Physical Anesthesia Plan  ASA: II  Anesthesia Plan: General and MAC   Post-op Pain Management:    Induction: Intravenous  Airway Management Planned: Oral ETT  Additional Equipment:   Intra-op Plan:   Post-operative Plan: Extubation in OR  Informed Consent: I have reviewed the patients History and Physical, chart, labs and discussed the procedure including the risks, benefits and alternatives for the proposed anesthesia with the patient or authorized representative who has indicated his/her understanding and acceptance.     Plan Discussed with: CRNA and Surgeon  Anesthesia Plan Comments:        Anesthesia Quick Evaluation

## 2011-11-02 NOTE — Progress Notes (Signed)
Discharged home.

## 2011-11-02 NOTE — Progress Notes (Signed)
Subjective: No pain except mild incisional pain.  Objective: Vital signs in last 24 hours: Temp:  [97.5 F (36.4 C)-98.6 F (37 C)] 98.6 F (37 C) (06/17 0946) Pulse Rate:  [53-83] 83  (06/17 0946) Resp:  [18-20] 18  (06/17 0946) BP: (160-177)/(52-65) 160/59 mmHg (06/17 0946) SpO2:  [94 %-96 %] 95 % (06/17 0946) Weight change:  Last BM Date: 10/27/11  PE: GEN:  NAD ABD:  Soft   Lab Results: CBC    Component Value Date/Time   WBC 8.1 11/02/2011 0703   RBC 3.58* 11/02/2011 0703   HGB 11.7* 11/02/2011 0703   HCT 34.2* 11/02/2011 0703   PLT 168 11/02/2011 0703   MCV 95.5 11/02/2011 0703   MCH 32.7 11/02/2011 0703   MCHC 34.2 11/02/2011 0703   RDW 12.4 11/02/2011 0703   LYMPHSABS 1.8 10/27/2011 2203   MONOABS 0.7 10/27/2011 2203   EOSABS 0.1 10/27/2011 2203   BASOSABS 0.0 10/27/2011 2203   CMP     Component Value Date/Time   NA 142 11/02/2011 0703   K 3.4* 11/02/2011 0703   CL 103 11/02/2011 0703   CO2 28 11/02/2011 0703   GLUCOSE 86 11/02/2011 0703   BUN 8 11/02/2011 0703   CREATININE 0.84 11/02/2011 0703   CALCIUM 8.8 11/02/2011 0703   PROT 5.5* 11/02/2011 0703   ALBUMIN 2.8* 11/02/2011 0703   AST 28 11/02/2011 0703   ALT 60* 11/02/2011 0703   ALKPHOS 93 11/02/2011 0703   BILITOT 0.5 11/02/2011 0703   GFRNONAA 65* 11/02/2011 0703   GFRAA 75* 11/02/2011 0703   Assessment:  1.  Gallstone pancreatitis. 2. Cholelithiasis post cholecystectomy 3.  Abnormal intraoperative cholangiogram. 4.  Elevated LFTs, near-normalized.  Plan:  1.  I have discussed with patient and family that she may have passed her CBD stones, or they might be floating (non-obstructing) in the bile duct.  After long discussion with the patient and family, we have elected to hold off on ERCP at this time, and to do outpatient EUS next week, with ERCP same-day to follow if choledocholithiasis is confirmed.  Patient is aware of risk, albeit small, of recurrent biliary troubles (pancreatitis, biliary colic, cholangitis)  in the interim if there are bile duct stones remaining. 2.  OK to discharge home today.  I have discussed case with Dr. Janee Morn.  Patient will call our office immediately for any worsening abdominal pain, fevers, jaundice, or any other worrisome symptoms. 3.  Thank you for the consult.   [name] 11/02/2011, 10:34 AM

## 2011-11-02 NOTE — Progress Notes (Signed)
Agree with note per JD,PA

## 2011-11-02 NOTE — Discharge Summary (Signed)
Physician Discharge Summary  [name] ZOX:[id number] DOB: Sep 16, 1930 DOA: 10/27/2011  PCP: Pearson Grippe, MD  Admit date: 10/27/2011 Discharge date: 11/02/2011  Recommendations for Outpatient Follow-up:  1. The patient will need to followup with Dr. Dulce Sellar for endoscopic ultrasound  Discharge Diagnoses:  Gallstone pancreatitis status post laparoscopic cholecystectomy with common bile duct stone found at intraoperative cholangiogram  Cholelithiasis Proteus UTI STATUS post 7 days of intravenous antibiotic treatment  Hypertension  Hypercholesterolemia  PVD (peripheral vascular disease)  Choledocholithiasis with obstruction  Discharge Condition: Good  Diet recommendation: Low-fat  History of present illness:  [name] is an 78 y.o. female with history of hypertension, peripheral vascular disease, hyperlipidemia, status post total knee and hip repair, presents to the [facility] complaining of abdominal pain for one day. She has no prior similar problem. She denied any fever or chills, shortness of breath or chest pain, black stool, bloody stool, or diarrhea. She denied any dysuria or polyuria. Evaluation in the emergency room included a lipase of 1984, normal white count of 9.3 thousand, normal hemoglobin, with BUN and creatinine of 22/1.3. Her urinalysis is also positive for infection with too numerous to count WBCs large bacteria, positive nitrite and leukocytes. She also had a ultrasound of her abdomen which showed possible stones in the gallbladder with some sludge, but the common bile duct is not dilated and there is no evidence of cholecystitis nor Murphy's sign. Hospitalist was asked to admit her for pancreatitis, cholelithiasis, and UTI.   Hospital Course:  Gallstone pancreatitis - . Patient was admitted with acute pancreatitis as proven by her clinical exam and lipase level of 1984. The patient had quick clinical improvement as evidenced by decreased abdominal pain  and nausea. She had No further vomiting during hospitalization. She Tolerated clear liquids and then post cholecystectomy advancement to a low-fat diet. She underwent laparoscopic cholecystectomy on 10/30/11. IOC revealed CBD stone. She was planed to have  ERCP 11/01/11 but because of good clinical improvement and normalization of the LFTs Dr. Dulce Sellar decided to schedule an endoscopic ultrasound as an outpatient first.  Proteus UTI - afebrile, improved clinically -she was treated with iv rocephin for 1 week  HTN- she had acceptable control. On home meds.  Systolic murmur - 2 d echo reveals mild to moderate increase in PAP , maybe dose systolic murmur is related to TR . Not hemodynamically significant. According to patient she had an outpatient stress test and cardiac cath by Dr. Aleen Campi years ago -    Procedures:  Laparoscopic cholecystectomy  Consultations:  [facility]  [facility]  Discharge Exam: Filed Vitals:   11/02/11 0946  BP: 160/59  Pulse: 83  Temp: 98.6 F (37 C)  Resp: 18   Filed Vitals:   11/01/11 1400 11/01/11 2100 11/02/11 0605 11/02/11 0946  BP: 161/65 177/52 174/57 160/59  Pulse: 54 53 53 83  Temp: 97.8 F (36.6 C) 97.5 F (36.4 C) 97.6 F (36.4 C) 98.6 F (37 C)  TempSrc: Oral Oral Oral Oral  Resp: 20 18 18 18   Height:      Weight:      SpO2: 96% 94% 95% 95%   General: Alert oriented x3 Cardiovascular: Regular rate and rhythm without murmurs rubs or gallops Respiratory: Clear to auscultation bilaterally  Discharge Instructions  Discharge Orders    Future Orders Please Complete By Expires   Diet fat modified      Increase activity slowly  Medication List  As of 11/02/2011 11:12 AM   TAKE these medications         amLODipine-valsartan 5-320 MG per tablet   Commonly known as: EXFORGE   Take 1 tablet by mouth daily.      aspirin 81 MG tablet   Take 81 mg by mouth daily.      atenolol 50 MG tablet   Commonly known  as: TENORMIN   Take 50 mg by mouth daily.      cholecalciferol 1000 UNITS tablet   Commonly known as: VITAMIN D   Take 1,000 Units by mouth daily.      doxazosin 8 MG tablet   Commonly known as: CARDURA   Take 8 mg by mouth at bedtime.      rosuvastatin 5 MG tablet   Commonly known as: CRESTOR   Take 5 mg by mouth daily.           Follow-up Information    Follow up with Pearson Grippe, MD. (as scheduled)    Contact information:   [address] [place] [address] [phone]           The results of significant diagnostics from this hospitalization (including imaging, microbiology, ancillary and laboratory) are listed below for reference.    Significant Diagnostic Studies: Dg Cholangiogram Operative  10/30/2011  *RADIOLOGY REPORT*  Clinical Data:   Gallstone pancreatitis.  INTRAOPERATIVE CHOLANGIOGRAM  Technique:  Cholangiographic images from the C-arm fluoroscopic device were submitted for interpretation post-operatively.  Please see the procedural report for the amount of contrast and the fluoroscopy time utilized.  Comparison:  Ultrasound of the abdomen 10/27/2011  Findings:  The gallbladder has been removed and the cystic duct cannulated.  Common hepatic and common bile ducts appear mildly dilated.  There is only faint passage of contrast into the duodenum.  At the level of the ampulla, there is a filling defect which persists on all three series of images.  I am concerned this could represent retained distal CBD stone or stones.  IMPRESSION: Slightly distended common hepatic and common bile ducts, with faint passage of contrast into the duodenum, and a suspected  filling defect at the ampulla.  Distal common bile duct stone is not excluded.  Original Report Authenticated By: Elsie Stain, M.D.   US Abdomen Complete  10/27/2011  *RADIOLOGY REPORT*  Clinical Data:  Right upper quadrant pain.  Hypertension and high cholesterol.  COMPLETE ABDOMINAL  ULTRASOUND  Comparison:  None.  Findings:  Gallbladder:  The gallbladder is distended.  There is sludge and probable small stones within.  No wall thickening or pericholecystic fluid. Sonographic Murphy's sign was not elicited.  Common bile duct: Normal, 2 mm.  Liver: Negative  IVC: Poorly visualized due to overlying bowel gas.  Pancreas:  Poorly visualized due to overlying bowel gas.  Spleen:  Normal in size and echogenicity.  Right Kidney:  11.0 cm.  Probable minimally complex cyst in the interpolar right kidney.  3.4 cm.  Left Kidney:  9.4 cm. No hydronephrosis.  Renal cortical thinning is mild.  Abdominal aorta:  Partially obscured by bowel gas.  No aneurysm or ascites.  IMPRESSION:  1.  Gallbladder sludge and probable stones.  Marked gallbladder distention, without specific evidence of acute cholecystitis. 2.  No other explanation for pain. 3.  Portions of anatomy obscured by overlying bowel gas.  Original Report Authenticated By: Consuello Bossier, M.D.    Microbiology: Recent Results (from the past 240 hour(s))  URINE CULTURE     Status: Normal   Collection Time   10/27/11 10:04 PM      Component Value Range Status Comment   Specimen Description URINE, CLEAN CATCH   Final    Special Requests NONE   Final    Culture  Setup Time [id number]   Final    Colony Count >=100,000 COLONIES/ML   Final    Culture PROTEUS MIRABILIS   Final    Report Status 10/29/2011 FINAL   Final    Organism ID, Bacteria PROTEUS MIRABILIS   Final   SURGICAL PCR SCREEN     Status: Normal   Collection Time   10/30/11  5:37 AM      Component Value Range Status Comment   MRSA, PCR NEGATIVE  NEGATIVE Final    Staphylococcus aureus NEGATIVE  NEGATIVE Final      Labs: Basic Metabolic Panel:  Lab 11/02/11 8119 10/31/11 0605 10/28/11 0358 10/27/11 2203  NA 142 138 138 137  K 3.4* 4.3 3.7 4.1  CL 103 105 106 102  CO2 28 26 22 27   GLUCOSE 86 100* 165* 121*  BUN 8 11 19 22   CREATININE 0.84 1.03 1.04 1.30*  CALCIUM 8.8  8.6 8.5 9.6  MG -- -- -- --  PHOS -- -- -- --   Liver Function Tests:  Lab 11/02/11 0703 11/01/11 0500 10/31/11 0605 10/29/11 0655 10/27/11 2203  AST 28 29 47* 137* 52*  ALT 60* 72* 101* 181* 25  ALKPHOS 93 89 97 103 78  BILITOT 0.5 0.5 0.5 1.1 0.9  PROT 5.5* 5.1* 5.1* 5.7* 6.7  ALBUMIN 2.8* 2.6* 2.7* 2.9* 3.9    Lab 10/29/11 0655 10/27/11 2203  LIPASE 54 1984*  AMYLASE 98 --   No results found for this basename: AMMONIA:5 in the last 168 hours CBC:  Lab 11/02/11 0703 10/31/11 0605 10/29/11 0655 10/28/11 0358 10/27/11 2203  WBC 8.1 8.1 6.0 9.5 9.3  NEUTROABS -- -- -- -- 6.7  HGB 11.7* 10.7* 11.4* 11.0* 13.0  HCT 34.2* 32.9* 34.3* 32.8* 37.5  MCV 95.5 97.3 99.1 94.5 95.2  PLT 168 140* 156 162 197   Cardiac Enzymes: No results found for this basename: CKTOTAL:5,CKMB:5,CKMBINDEX:5,TROPONINI:5 in the last 168 hours BNP: BNP (last 3 results) No results found for this basename: PROBNP:3 in the last 8760 hours CBG: No results found for this basename: GLUCAP:5 in the last 168 hours  Time coordinating discharge: 45 minutes  Signed:  Lonia Blood, MD  [facility] 11/02/2011, 11:12 AM

## 2011-11-04 DIAGNOSIS — E7800 Pure hypercholesterolemia, unspecified: Secondary | ICD-10-CM | POA: Diagnosis not present

## 2011-11-04 DIAGNOSIS — I10 Essential (primary) hypertension: Secondary | ICD-10-CM | POA: Diagnosis not present

## 2011-11-04 DIAGNOSIS — K819 Cholecystitis, unspecified: Secondary | ICD-10-CM | POA: Diagnosis not present

## 2011-11-09 ENCOUNTER — Encounter (HOSPITAL_COMMUNITY): Payer: Self-pay | Admitting: *Deleted

## 2011-11-11 ENCOUNTER — Encounter (HOSPITAL_COMMUNITY): Payer: Self-pay | Admitting: Anesthesiology

## 2011-11-11 ENCOUNTER — Ambulatory Visit (HOSPITAL_COMMUNITY): Payer: Medicare Other | Admitting: Anesthesiology

## 2011-11-11 ENCOUNTER — Encounter (HOSPITAL_COMMUNITY)
Admission: RE | Disposition: A | Discharge status: Home / Self Care | Payer: Self-pay | Source: Ambulatory Visit | Attending: Gastroenterology

## 2011-11-11 ENCOUNTER — Ambulatory Visit (HOSPITAL_COMMUNITY)
Admission: RE | Admit: 2011-11-11 | Discharge: 2011-11-11 | Disposition: A | Discharge status: Home / Self Care | Payer: Medicare Other | Source: Ambulatory Visit | Attending: Gastroenterology | Admitting: Gastroenterology

## 2011-11-11 ENCOUNTER — Encounter (HOSPITAL_COMMUNITY): Payer: Self-pay | Admitting: *Deleted

## 2011-11-11 DIAGNOSIS — I10 Essential (primary) hypertension: Secondary | ICD-10-CM | POA: Diagnosis not present

## 2011-11-11 DIAGNOSIS — K8020 Calculus of gallbladder without cholecystitis without obstruction: Secondary | ICD-10-CM | POA: Diagnosis not present

## 2011-11-11 DIAGNOSIS — K859 Acute pancreatitis without necrosis or infection, unspecified: Secondary | ICD-10-CM | POA: Insufficient documentation

## 2011-11-11 DIAGNOSIS — M129 Arthropathy, unspecified: Secondary | ICD-10-CM | POA: Diagnosis not present

## 2011-11-11 DIAGNOSIS — K8050 Calculus of bile duct without cholangitis or cholecystitis without obstruction: Secondary | ICD-10-CM | POA: Insufficient documentation

## 2011-11-11 DIAGNOSIS — R109 Unspecified abdominal pain: Secondary | ICD-10-CM | POA: Insufficient documentation

## 2011-11-11 DIAGNOSIS — E7800 Pure hypercholesterolemia, unspecified: Secondary | ICD-10-CM | POA: Insufficient documentation

## 2011-11-11 DIAGNOSIS — R112 Nausea with vomiting, unspecified: Secondary | ICD-10-CM | POA: Insufficient documentation

## 2011-11-11 DIAGNOSIS — R5381 Other malaise: Secondary | ICD-10-CM | POA: Insufficient documentation

## 2011-11-11 DIAGNOSIS — R011 Cardiac murmur, unspecified: Secondary | ICD-10-CM | POA: Diagnosis not present

## 2011-11-11 DIAGNOSIS — R7989 Other specified abnormal findings of blood chemistry: Secondary | ICD-10-CM | POA: Diagnosis not present

## 2011-11-11 DIAGNOSIS — I739 Peripheral vascular disease, unspecified: Secondary | ICD-10-CM | POA: Insufficient documentation

## 2011-11-11 DIAGNOSIS — R932 Abnormal findings on diagnostic imaging of liver and biliary tract: Secondary | ICD-10-CM | POA: Diagnosis not present

## 2011-11-11 DIAGNOSIS — R0989 Other specified symptoms and signs involving the circulatory and respiratory systems: Secondary | ICD-10-CM | POA: Diagnosis not present

## 2011-11-11 HISTORY — PX: EUS: SHX5427

## 2011-11-11 HISTORY — DX: Personal history of urinary (tract) infections: Z87.440

## 2011-11-11 HISTORY — DX: Cardiac murmur, unspecified: R01.1

## 2011-11-11 SURGERY — ESOPHAGEAL ENDOSCOPIC ULTRASOUND (EUS) RADIAL
Anesthesia: Monitor Anesthesia Care

## 2011-11-11 MED ORDER — DEXAMETHASONE SODIUM PHOSPHATE 4 MG/ML IJ SOLN
INTRAMUSCULAR | Status: DC | PRN
  Administered 2011-11-11: 10 mg via INTRAVENOUS

## 2011-11-11 MED ORDER — SUCCINYLCHOLINE CHLORIDE 20 MG/ML IJ SOLN
INTRAMUSCULAR | Status: DC | PRN
  Administered 2011-11-11: 100 mg via INTRAVENOUS

## 2011-11-11 MED ORDER — MIDAZOLAM HCL 5 MG/5ML IJ SOLN
INTRAMUSCULAR | Status: DC | PRN
  Administered 2011-11-11: 1 mg via INTRAVENOUS

## 2011-11-11 MED ORDER — CIPROFLOXACIN IN D5W 400 MG/200ML IV SOLN
INTRAVENOUS | Status: AC
  Filled 2011-11-11: qty 200

## 2011-11-11 MED ORDER — CIPROFLOXACIN IN D5W 400 MG/200ML IV SOLN: 400.0000 mg | Freq: Once | INTRAVENOUS | Status: DC

## 2011-11-11 MED ORDER — PROPOFOL 10 MG/ML IV EMUL
INTRAVENOUS | Status: DC | PRN
  Administered 2011-11-11: 175 mg via INTRAVENOUS

## 2011-11-11 MED ORDER — SODIUM CHLORIDE 0.9 % IV SOLN: Freq: Once | INTRAVENOUS | Status: DC

## 2011-11-11 MED ORDER — LIDOCAINE HCL (CARDIAC) 20 MG/ML IV SOLN
INTRAVENOUS | Status: DC | PRN
  Administered 2011-11-11: 50 mg via INTRAVENOUS

## 2011-11-11 MED ORDER — LACTATED RINGERS IV SOLN
INTRAVENOUS | Status: DC | PRN
  Administered 2011-11-11 (×2): via INTRAVENOUS

## 2011-11-11 MED ORDER — LACTATED RINGERS IV SOLN
INTRAVENOUS | Status: DC
  Administered 2011-11-11: 1000 mL via INTRAVENOUS

## 2011-11-11 MED ORDER — CISATRACURIUM BESYLATE (PF) 10 MG/5ML IV SOLN
INTRAVENOUS | Status: DC | PRN
  Administered 2011-11-11: 6 mg via INTRAVENOUS

## 2011-11-11 MED ORDER — FENTANYL CITRATE 0.05 MG/ML IJ SOLN
INTRAMUSCULAR | Status: DC | PRN
  Administered 2011-11-11 (×2): 50 ug via INTRAVENOUS

## 2011-11-11 MED ORDER — ONDANSETRON HCL 4 MG/2ML IJ SOLN
INTRAMUSCULAR | Status: DC | PRN
  Administered 2011-11-11: 2 mg via INTRAVENOUS

## 2011-11-11 MED ORDER — EPHEDRINE SULFATE 50 MG/ML IJ SOLN
INTRAMUSCULAR | Status: DC | PRN
  Administered 2011-11-11: 5 mg via INTRAVENOUS

## 2011-11-11 NOTE — Discharge Instructions (Signed)
Endoscopic Ultrasound (EUS)  Care After  Please read the instructions outlined below and refer to this sheet in the next few weeks. These discharge instructions provide you with general information on caring for yourself after you leave the hospital. Your doctor may also give you specific instructions. While your treatment has been planned according to the most current medical practices available, unavoidable complications occasionally occur. If you have any problems or questions after discharge, please call Dr. Dulce Sellar [facility]) at [phone].  HOME CARE INSTRUCTIONS  Activity  You may resume your regular activity but move at a slower pace for the next 24 hours.   Take frequent rest periods for the next 24 hours.   Walking will help expel (get rid of) the air and reduce the bloated feeling in your abdomen.   No driving for 24 hours (because of the anesthesia (medicine) used during the test).   You may shower.   Do not sign any important legal documents or operate any machinery for 24 hours (because of the anesthesia used during the test).  Nutrition  Drink plenty of fluids.   You may resume your normal diet.   Begin with a light meal and progress to your normal diet.   Avoid alcoholic beverages for 24 hours or as instructed by your caregiver.   Medications You may resume your normal medications unless your caregiver tells you otherwise.  What you can expect today  You may experience abdominal discomfort such as a feeling of fullness or "gas" pains.   You may experience a sore throat for 2 to 3 days. This is normal. Gargling with salt water may help this.   SEEK IMMEDIATE MEDICAL CARE IF:  You have excessive nausea (feeling sick to your stomach) and/or vomiting.   You have severe abdominal pain and distention (swelling).   You have trouble swallowing.   You have a temperature over 100 F (37.8 C).   You have rectal bleeding or vomiting of blood.    Document Released: 12/17/2003 Document Revised: 01/14/2011 Document Reviewed: 06/29/2007 [facility] Patient Information 2012 [place], [place]. Endoscopy Care After Please read the instructions outlined below and refer to this sheet in the next few weeks. These discharge instructions provide you with general information on caring for yourself after you leave the hospital. Your doctor may also give you specific instructions. While your treatment has been planned according to the most current medical practices available, unavoidable complications occasionally occur. If you have any problems or questions after discharge, please call your doctor. HOME CARE INSTRUCTIONS Activity  You may resume your regular activity but move at a slower pace for the next 24 hours.   Take frequent rest periods for the next 24 hours.   Walking will help expel (get rid of) the air and reduce the bloated feeling in your abdomen.   No driving for 24 hours (because of the anesthesia (medicine) used during the test).   You may shower.   Do not sign any important legal documents or operate any machinery for 24 hours (because of the anesthesia used during the test).  Nutrition  Drink plenty of fluids.   You may resume your normal diet.   Begin with a light meal and progress to your normal diet.   Avoid alcoholic beverages for 24 hours or as instructed by your caregiver.  Medications You may resume your normal medications unless your caregiver tells you otherwise. What you can expect today  You may experience abdominal discomfort such as a feeling  of fullness or "gas" pains.   You may experience a sore throat for 2 to 3 days. This is normal. Gargling with salt water may help this.  Follow-up Your doctor will discuss the results of your test with you. SEEK IMMEDIATE MEDICAL CARE IF:  You have excessive nausea (feeling sick to your stomach) and/or vomiting.   You have severe abdominal pain and distention  (swelling).   You have trouble swallowing.   You have a temperature over 100 F (37.8 C).   You have rectal bleeding or vomiting of blood.  Document Released: 12/17/2003 Document Revised: 04/23/2011 Document Reviewed: 06/29/2007 [facility] Patient Information 2012 [place], [place].

## 2011-11-11 NOTE — Interval H&P Note (Signed)
History and Physical Interval Note:  11/11/2011 12:39 PM  [name]  has presented today for surgery, with the diagnosis of bile duct stone  The various methods of treatment have been discussed with the patient and family. After consideration of risks, benefits and other options for treatment, the patient has consented to  Procedure(s) (LRB): ESOPHAGEAL ENDOSCOPIC ULTRASOUND (EUS) RADIAL (N/A) ENDOSCOPIC RETROGRADE CHOLANGIOPANCREATOGRAPHY (ERCP) (N/A) as a surgical intervention .  The patient's history has been reviewed, patient examined, no change in status, stable for surgery.  I have reviewed the patients' chart and labs.  Questions were answered to the patient's satisfaction.     Leylani Duley M  Assessment:  1.  Abnormal intraoperative cholangiogram. 2.  Elevated LFTs, downtrending. 3.  Abdominal pain, essentially resolved.  Plan:  1.  Endoscopic ultrasound to assess for choledocholithiasis. 2.  ERCP for bile duct stone clearance, if choledocholithiasis is seen. 3.  Risks (bleeding, infection, bowel perforation that could require surgery, sedation-related changes in cardiopulmonary systems), benefits (identification and possible treatment of source of symptoms, exclusion of certain causes of symptoms), and alternatives (watchful waiting, radiographic imaging studies, empiric medical treatment) of upper endoscopy with ultrasound (EUS) were explained to patient/family in detail and patient wishes to proceed. 4.  Risks (up to and including bleeding, infection, perforation, pancreatitis that can be complicated by infected necrosis and death), benefits (removal of stones, alleviating blockage, decreasing risk of cholangitis or choledocholithiasis-related pancreatitis), and alternatives (watchful waiting, percutaneous transhepatic cholangiography) of ERCP were explained to patient/family in detail and patient elects to proceed.

## 2011-11-11 NOTE — Preoperative (Signed)
Beta Blockers   Reason not to administer Beta Blockers:Not Applicable 

## 2011-11-11 NOTE — Op Note (Signed)
[facility] [address] [place], [place]  [address]  ENDOSCOPIC ULTRASOUND PROCEDURE REPORT  PATIENT:  [name], [name]  MR#:  [id number] BIRTHDATE:  01/29/1931  GENDER:  female  ENDOSCOPIST:  Willis Modena, MD REFERRED BY:  Violeta Gelinas, M.D.  PROCEDURE DATE:  11/11/2011 PROCEDURE:  Upper EUS ASA CLASS:  Class II INDICATIONS:  gallstone pancreatitis, elevated LFTs, abnormal intraoperative cholangiogram  MEDICATIONS:   General anesthesia  DESCRIPTION OF PROCEDURE:   After the risks benefits and alternatives of the procedure were  explained, informed consent was obtained. The patient was then placed in the left, lateral, decubitus postion and IV sedation was administered. Throughout the procedure, the patient's blood pressure, pulse and oxygen saturations were monitored continuously.  Under direct visualization, the Pentax EUS Radial [id number] endoscope was introduced through the mouth and advanced to the second portion of the duodenum.  Water was used as necessary to provide an acoustic interface.  Upon completion of the imaging, water was removed and the patient was sent to the recovery room in satisfactory condition.  <<PROCEDUREIMAGES>>  FINDINGS: Parenchyma of pancreatic head, uncinate, genu, body and tail normal.  No evidence of acute or chronic pancreatitis.  No pancreatic mass.  Small periampullary diverticulum noted.  CBD   8 mm maximally.  No evidence of wall thickening or choledocholithiasis.  Normal-appearing cystic duct stump with post-cholecystectomy anatomy seen.  Ampulla normal via EUS.  ENDOSCOPIC IMPRESSION:    1.  No evidence of choledocholithiasis. Suspect previously seen bile duct stones (on intraoperative cholangiogram) had already passed by today's exam.  LFTs had near-normalized at end of patient's             recent hospitalization.  RECOMMENDATIONS:      1.  Watch for potential complications of procedure. 2.  No further  evaluation needed. 3.  Follow-up with [place] on as-needed basis.  ______________________________ Willis Modena  CC:  n. eSIGNEDWillis Modena at 11/11/2011 02:23 PM  Marylen Ponto, [phone]

## 2011-11-11 NOTE — H&P (View-Only) (Signed)
[name] is an 78 y.o. female.  HPI: Pt is a 78 yr old female with 24 hour history of abdominal pain. She presented to the [facility] for evaluation. In the ER she developed nausea and had dry heaves. She has had a similar episode several weeks ago that resolved very quickly. Her evaluation showed that she had pancreatitis with lipase of 1984. Ultrasound showed gallbladder distention with sludge and probable small stones but no evidence of cholecystitis. She was admitted and we are asked to see her for possible gallstone pancreatitis.  Past Medical History   Diagnosis  Date   .  Hypertension    .  PAD (peripheral artery disease)    .  High cholesterol    .  Arthritis     Past Surgical History   Procedure  Date   .  Total hip arthroplasty    .  Total knee arthroplasty     History reviewed. No pertinent family history.  Social History: reports that she has never smoked. She does not have any smokeless tobacco history on file. She reports that she does not drink alcohol or use illicit drugs.  Allergies:  Allergies   Allergen  Reactions   .  Penicillins    .  Sulfa Antibiotics     Medications: I have reviewed the patient's current medications.  Results for orders placed during the hospital encounter of 10/27/11 (from the past 48 hour(s))   CBC Status: Normal    Collection Time    10/27/11 10:03 PM   Component  Value  Range  Comment    WBC  9.3  4.0 - 10.5 K/uL     RBC  3.94  3.87 - 5.11 MIL/uL     Hemoglobin  13.0  12.0 - 15.0 g/dL     HCT  16.1  09.6 - 04.5 %     MCV  95.2  78.0 - 100.0 fL     MCH  33.0  26.0 - 34.0 pg     MCHC  34.7  30.0 - 36.0 g/dL     RDW  40.9  81.1 - 91.4 %     Platelets  197  150 - 400 K/uL    DIFFERENTIAL Status: Normal    Collection Time    10/27/11 10:03 PM   Component  Value  Range  Comment    Neutrophils Relative  72  43 - 77 %     Neutro Abs  6.7  1.7 - 7.7 K/uL     Lymphocytes Relative  19  12 - 46 %     Lymphs Abs  1.8  0.7 - 4.0 K/uL     Monocytes Relative  7  3 - 12 %     Monocytes Absolute  0.7  0.1 - 1.0 K/uL     Eosinophils Relative  2  0 - 5 %     Eosinophils Absolute  0.1  0.0 - 0.7 K/uL     Basophils Relative  0  0 - 1 %     Basophils Absolute  0.0  0.0 - 0.1 K/uL    COMPREHENSIVE METABOLIC PANEL Status: Abnormal    Collection Time    10/27/11 10:03 PM   Component  Value  Range  Comment    Sodium  137  135 - 145 mEq/L     Potassium  4.1  3.5 - 5.1 mEq/L     Chloride  102  96 - 112 mEq/L  CO2  27  19 - 32 mEq/L     Glucose, Bld  121 (*)  70 - 99 mg/dL     BUN  22  6 - 23 mg/dL     Creatinine, Ser  7.82 (*)  0.50 - 1.10 mg/dL     Calcium  9.6  8.4 - 10.5 mg/dL     Total Protein  6.7  6.0 - 8.3 g/dL     Albumin  3.9  3.5 - 5.2 g/dL     AST  52 (*)  0 - 37 U/L     ALT  25  0 - 35 U/L     Alkaline Phosphatase  78  39 - 117 U/L     Total Bilirubin  0.9  0.3 - 1.2 mg/dL     GFR calc non Af Amer  38 (*)  >90 mL/min     GFR calc Af Amer  44 (*)  >90 mL/min    LIPASE, BLOOD Status: Abnormal    Collection Time    10/27/11 10:03 PM   Component  Value  Range  Comment    Lipase  1984 (*)  11 - 59 U/L    URINALYSIS, ROUTINE W REFLEX MICROSCOPIC Status: Abnormal    Collection Time    10/27/11 10:04 PM   Component  Value  Range  Comment    Color, Urine  YELLOW  YELLOW     APPearance  TURBID (*)  CLEAR     Specific Gravity, Urine  1.018  1.005 - 1.030     pH  8.0  5.0 - 8.0     Glucose, UA  NEGATIVE  NEGATIVE mg/dL     Hgb urine dipstick  SMALL (*)  NEGATIVE     Bilirubin Urine  NEGATIVE  NEGATIVE     Ketones, ur  NEGATIVE  NEGATIVE mg/dL     Protein, ur  30 (*)  NEGATIVE mg/dL     Urobilinogen, UA  0.2  0.0 - 1.0 mg/dL     Nitrite  POSITIVE (*)  NEGATIVE     Leukocytes, UA  LARGE (*)  NEGATIVE    URINE MICROSCOPIC-ADD ON Status: Abnormal    Collection Time    10/27/11 10:04 PM   Component  Value  Range  Comment    Squamous Epithelial / LPF  FEW (*)  RARE     WBC, UA  TOO NUMEROUS TO COUNT  <3 WBC/hpf     RBC  / HPF  3-6  <3 RBC/hpf     Bacteria, UA  MANY (*)  RARE    CBC Status: Abnormal    Collection Time    10/28/11 3:58 AM   Component  Value  Range  Comment    WBC  9.5  4.0 - 10.5 K/uL     RBC  3.47 (*)  3.87 - 5.11 MIL/uL     Hemoglobin  11.0 (*)  12.0 - 15.0 g/dL     HCT  95.6 (*)  21.3 - 46.0 %     MCV  94.5  78.0 - 100.0 fL     MCH  31.7  26.0 - 34.0 pg     MCHC  33.5  30.0 - 36.0 g/dL     RDW  08.6  57.8 - 46.9 %     Platelets  162  150 - 400 K/uL    BASIC METABOLIC PANEL Status: Abnormal    Collection Time    10/28/11 3:58 AM  Component  Value  Range  Comment    Sodium  138  135 - 145 mEq/L     Potassium  3.7  3.5 - 5.1 mEq/L     Chloride  106  96 - 112 mEq/L     CO2  22  19 - 32 mEq/L     Glucose, Bld  165 (*)  70 - 99 mg/dL     BUN  19  6 - 23 mg/dL     Creatinine, Ser  4.09  0.50 - 1.10 mg/dL     Calcium  8.5  8.4 - 10.5 mg/dL     GFR calc non Af Amer  50 (*)  >90 mL/min     GFR calc Af Amer  58 (*)  >90 mL/min     US Abdomen Complete  10/27/2011 *RADIOLOGY REPORT* Clinical Data: Right upper quadrant pain. Hypertension and high cholesterol. COMPLETE ABDOMINAL ULTRASOUND Comparison: None. Findings: Gallbladder: The gallbladder is distended. There is sludge and probable small stones within. No wall thickening or pericholecystic fluid. Sonographic Murphy's sign was not elicited. Common bile duct: Normal, 2 mm. Liver: Negative IVC: Poorly visualized due to overlying bowel gas. Pancreas: Poorly visualized due to overlying bowel gas. Spleen: Normal in size and echogenicity. Right Kidney: 11.0 cm. Probable minimally complex cyst in the interpolar right kidney. 3.4 cm. Left Kidney: 9.4 cm. No hydronephrosis. Renal cortical thinning is mild. Abdominal aorta: Partially obscured by bowel gas. No aneurysm or ascites. IMPRESSION: 1. Gallbladder sludge and probable stones. Marked gallbladder distention, without specific evidence of acute cholecystitis. 2. No other explanation for pain. 3.  Portions of anatomy obscured by overlying bowel gas. Original Report Authenticated By: Consuello Bossier, M.D.   Review of Systems  Constitutional: Positive for malaise/fatigue. Negative for fever and chills.  HENT: Negative for hearing loss and congestion.  Eyes: Negative for blurred vision and double vision.  Respiratory: Negative for cough and shortness of breath.  Cardiovascular: Negative for chest pain and palpitations.  Gastrointestinal: Positive for nausea, vomiting (In ER last night) and abdominal pain.  Genitourinary: Negative for dysuria, urgency and frequency.  Musculoskeletal: Positive for back pain and joint pain.  Skin: Negative for rash.  Neurological: Positive for weakness. Negative for dizziness, tingling, focal weakness and headaches.  Endo/Heme/Allergies: Bruises/bleeds easily (on aspirin).  Psychiatric/Behavioral: Negative for depression. The patient is not nervous/anxious.   Blood pressure 137/58, pulse 81, temperature 99.1 F (37.3 C), temperature source Oral, resp. rate 17, height 5\' 3"  (1.6 m), weight 165 lb (74.844 kg), SpO2 98.00%.  Physical Exam  Constitutional: She is oriented to person, place, and time. She appears well-developed and well-nourished. No distress.  HENT:  Head: Normocephalic and atraumatic.  Eyes: EOM are normal. Pupils are equal, round, and reactive to light.  Neck: Normal range of motion. Neck supple.  Right carotid bruit (followed by primary care)  Cardiovascular: Normal rate and regular rhythm.  Murmur (Soft murmur) heard.  Respiratory: Effort normal and breath sounds normal.  GI: Bowel sounds are normal. She exhibits distension (Mildly distended). There is tenderness (Especially in the epigastric and LUQ). There is no rebound.  Musculoskeletal: She exhibits no edema and no tenderness.  Some joint stiffness  Neurological: She is alert and oriented to person, place, and time.  Skin: Skin is warm and dry.  Psychiatric: She has a normal mood  and affect. Her behavior is normal. Thought content normal.   Assessment/Plan:  1. Pancreatitis: possibly gallstone pancreatitis given ultrasound findings but no evidence  of retained stone. Patient clinically feels better today, no lipase today. Possible history of similar pain several weeks ago. May need cholecystectomy on this admission but will need for pancreatitis to resolve before proceeding. Keep on sips of clears only and hydrate with IVFs for now. Will have Dr. Janee Morn see and evaluate the patient later today and make further recommendations.  WHITE, ELIZABETH  10/28/2011, 9:49 AM

## 2011-11-11 NOTE — Transfer of Care (Signed)
Immediate Anesthesia Transfer of Care Note  Patient: [name]  Procedure(s) Performed: Procedure(s) (LRB): ESOPHAGEAL ENDOSCOPIC ULTRASOUND (EUS) RADIAL (N/A)  Patient Location: PACU and Endoscopy Unit  Anesthesia Type: General  Level of Consciousness: awake, oriented and patient cooperative  Airway & Oxygen Therapy: Patient Spontanous Breathing and Patient connected to face mask oxygen  Post-op Assessment: Report given to PACU RN, Post -op Vital signs reviewed and stable and Patient moving all extremities  Post vital signs: Reviewed and stable  Complications: No apparent anesthesia complications

## 2011-11-11 NOTE — Anesthesia Postprocedure Evaluation (Signed)
  Anesthesia Post-op Note  Patient: [name]  Procedure(s) Performed: Procedure(s) (LRB): ESOPHAGEAL ENDOSCOPIC ULTRASOUND (EUS) RADIAL (N/A)  Patient Location: PACU  Anesthesia Type: General  Level of Consciousness: awake and alert   Airway and Oxygen Therapy: Patient Spontanous Breathing  Post-op Pain: mild  Post-op Assessment: Post-op Vital signs reviewed, Patient's Cardiovascular Status Stable, Respiratory Function Stable, Patent Airway and No signs of Nausea or vomiting  Post-op Vital Signs: stable  Complications: No apparent anesthesia complications \

## 2011-11-12 ENCOUNTER — Encounter (HOSPITAL_COMMUNITY): Payer: Self-pay | Admitting: Gastroenterology

## 2011-12-01 ENCOUNTER — Encounter (INDEPENDENT_AMBULATORY_CARE_PROVIDER_SITE_OTHER): Payer: Medicare Other | Admitting: Surgery

## 2011-12-02 ENCOUNTER — Ambulatory Visit (INDEPENDENT_AMBULATORY_CARE_PROVIDER_SITE_OTHER): Payer: Medicare Other | Admitting: General Surgery

## 2011-12-02 ENCOUNTER — Encounter (INDEPENDENT_AMBULATORY_CARE_PROVIDER_SITE_OTHER): Payer: Self-pay | Admitting: General Surgery

## 2011-12-02 VITALS — BP 124/74 | HR 68 | Temp 96.6°F | Resp 16 | Ht 63.0 in | Wt 153.8 lb

## 2011-12-02 DIAGNOSIS — Z9889 Other specified postprocedural states: Secondary | ICD-10-CM

## 2011-12-02 DIAGNOSIS — K859 Acute pancreatitis without necrosis or infection, unspecified: Secondary | ICD-10-CM

## 2011-12-02 DIAGNOSIS — Z9049 Acquired absence of other specified parts of digestive tract: Secondary | ICD-10-CM

## 2011-12-02 NOTE — Progress Notes (Signed)
Subjective:     Patient ID: [name], female   DOB: 1930/06/02, 78 y.o.   MRN: [id number]  HPI Patient status post laparoscopic cholecystectomy and ERCP for biliary pancreatitis. She is doing well. She is eating. She is having no diarrhea.  Review of Systems     Objective:   Physical Exam Abdomen is soft and nontender. All 4 wounds are well-healed without signs of infection.    Assessment:     Doing very well status post laparoscopic cholecystectomy for biliary pancreatitis.    Plan:     Resume diet as tolerated. Return p.r.n.

## 2012-02-20 DIAGNOSIS — Z23 Encounter for immunization: Secondary | ICD-10-CM | POA: Diagnosis not present

## 2012-03-07 DIAGNOSIS — E7800 Pure hypercholesterolemia, unspecified: Secondary | ICD-10-CM | POA: Diagnosis not present

## 2012-03-07 DIAGNOSIS — I10 Essential (primary) hypertension: Secondary | ICD-10-CM | POA: Diagnosis not present

## 2012-03-10 DIAGNOSIS — M129 Arthropathy, unspecified: Secondary | ICD-10-CM | POA: Diagnosis not present

## 2012-03-10 DIAGNOSIS — I10 Essential (primary) hypertension: Secondary | ICD-10-CM | POA: Diagnosis not present

## 2012-03-10 DIAGNOSIS — E7800 Pure hypercholesterolemia, unspecified: Secondary | ICD-10-CM | POA: Diagnosis not present

## 2012-03-10 DIAGNOSIS — R7309 Other abnormal glucose: Secondary | ICD-10-CM | POA: Diagnosis not present

## 2012-03-15 DIAGNOSIS — H903 Sensorineural hearing loss, bilateral: Secondary | ICD-10-CM | POA: Diagnosis not present

## 2012-06-09 DIAGNOSIS — Z1231 Encounter for screening mammogram for malignant neoplasm of breast: Secondary | ICD-10-CM | POA: Diagnosis not present

## 2012-07-25 DIAGNOSIS — E559 Vitamin D deficiency, unspecified: Secondary | ICD-10-CM | POA: Diagnosis not present

## 2012-07-25 DIAGNOSIS — I10 Essential (primary) hypertension: Secondary | ICD-10-CM | POA: Diagnosis not present

## 2012-08-01 DIAGNOSIS — M549 Dorsalgia, unspecified: Secondary | ICD-10-CM | POA: Diagnosis not present

## 2012-08-30 DIAGNOSIS — M810 Age-related osteoporosis without current pathological fracture: Secondary | ICD-10-CM | POA: Diagnosis not present

## 2012-09-22 DIAGNOSIS — N8111 Cystocele, midline: Secondary | ICD-10-CM | POA: Diagnosis not present

## 2012-09-22 DIAGNOSIS — Z1212 Encounter for screening for malignant neoplasm of rectum: Secondary | ICD-10-CM | POA: Diagnosis not present

## 2012-10-26 DIAGNOSIS — Z961 Presence of intraocular lens: Secondary | ICD-10-CM | POA: Diagnosis not present

## 2013-01-26 DIAGNOSIS — I10 Essential (primary) hypertension: Secondary | ICD-10-CM | POA: Diagnosis not present

## 2013-01-26 DIAGNOSIS — M899 Disorder of bone, unspecified: Secondary | ICD-10-CM | POA: Diagnosis not present

## 2013-01-26 DIAGNOSIS — E7800 Pure hypercholesterolemia, unspecified: Secondary | ICD-10-CM | POA: Diagnosis not present

## 2013-02-01 DIAGNOSIS — E7800 Pure hypercholesterolemia, unspecified: Secondary | ICD-10-CM | POA: Diagnosis not present

## 2013-02-01 DIAGNOSIS — Z23 Encounter for immunization: Secondary | ICD-10-CM | POA: Diagnosis not present

## 2013-02-01 DIAGNOSIS — L659 Nonscarring hair loss, unspecified: Secondary | ICD-10-CM | POA: Diagnosis not present

## 2013-02-01 DIAGNOSIS — E559 Vitamin D deficiency, unspecified: Secondary | ICD-10-CM | POA: Diagnosis not present

## 2013-02-01 DIAGNOSIS — I10 Essential (primary) hypertension: Secondary | ICD-10-CM | POA: Diagnosis not present

## 2013-03-09 ENCOUNTER — Emergency Department (HOSPITAL_BASED_OUTPATIENT_CLINIC_OR_DEPARTMENT_OTHER): Payer: Medicare Other

## 2013-03-09 ENCOUNTER — Encounter (HOSPITAL_BASED_OUTPATIENT_CLINIC_OR_DEPARTMENT_OTHER): Payer: Self-pay | Admitting: Emergency Medicine

## 2013-03-09 ENCOUNTER — Emergency Department (HOSPITAL_BASED_OUTPATIENT_CLINIC_OR_DEPARTMENT_OTHER)
Admission: EM | Admit: 2013-03-09 | Discharge: 2013-03-09 | Disposition: A | Discharge status: Home / Self Care | Payer: Medicare Other | Attending: Emergency Medicine | Admitting: Emergency Medicine

## 2013-03-09 DIAGNOSIS — E7800 Pure hypercholesterolemia, unspecified: Secondary | ICD-10-CM | POA: Insufficient documentation

## 2013-03-09 DIAGNOSIS — Z880 Allergy status to penicillin: Secondary | ICD-10-CM | POA: Diagnosis not present

## 2013-03-09 DIAGNOSIS — I10 Essential (primary) hypertension: Secondary | ICD-10-CM | POA: Diagnosis not present

## 2013-03-09 DIAGNOSIS — Z79899 Other long term (current) drug therapy: Secondary | ICD-10-CM | POA: Insufficient documentation

## 2013-03-09 DIAGNOSIS — M129 Arthropathy, unspecified: Secondary | ICD-10-CM | POA: Diagnosis not present

## 2013-03-09 DIAGNOSIS — R011 Cardiac murmur, unspecified: Secondary | ICD-10-CM | POA: Insufficient documentation

## 2013-03-09 DIAGNOSIS — Z96659 Presence of unspecified artificial knee joint: Secondary | ICD-10-CM | POA: Insufficient documentation

## 2013-03-09 DIAGNOSIS — Z87440 Personal history of urinary (tract) infections: Secondary | ICD-10-CM | POA: Diagnosis not present

## 2013-03-09 DIAGNOSIS — Z7982 Long term (current) use of aspirin: Secondary | ICD-10-CM | POA: Diagnosis not present

## 2013-03-09 DIAGNOSIS — M79609 Pain in unspecified limb: Secondary | ICD-10-CM | POA: Diagnosis not present

## 2013-03-09 DIAGNOSIS — M25569 Pain in unspecified knee: Secondary | ICD-10-CM | POA: Insufficient documentation

## 2013-03-09 DIAGNOSIS — M25559 Pain in unspecified hip: Secondary | ICD-10-CM | POA: Diagnosis not present

## 2013-03-09 DIAGNOSIS — M25562 Pain in left knee: Secondary | ICD-10-CM

## 2013-03-09 IMAGING — US US EXTREM LOW VENOUS*L*
1 series · 14 of 24 positions shown · non-contrast
Comparison: None.

CLINICAL DATA: Left leg pain

EXAM:
VENOUS DOPPLER ULTRASOUND OF LEFT LOWER EXTREMITY
TECHNIQUE: Gray-scale sonography with graded compression, as well as color
Doppler and duplex ultrasound, were performed to evaluate the deep
venous system from the level of the common femoral vein through the
popliteal and proximal calf veins. Spectral Doppler was utilized to
evaluate flow at rest and with distal augmentation maneuvers.

[Series 1: us extrem low venous*left* · 14 of 25 slices shown]
[im 1/25]
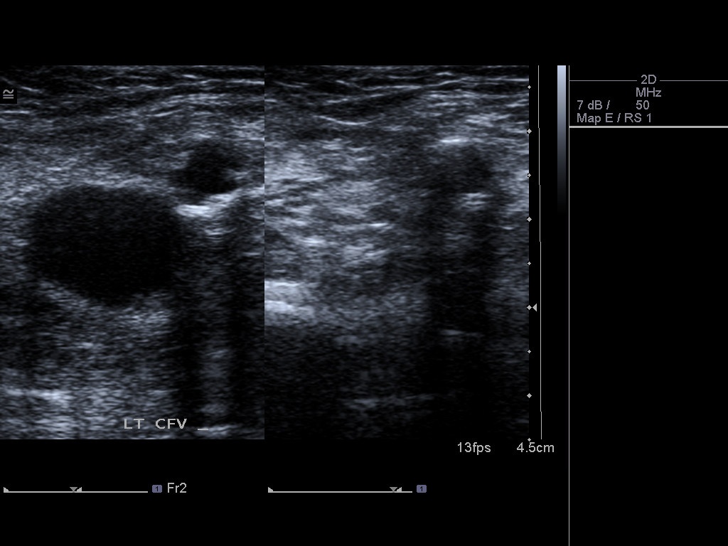
[im 3/25]
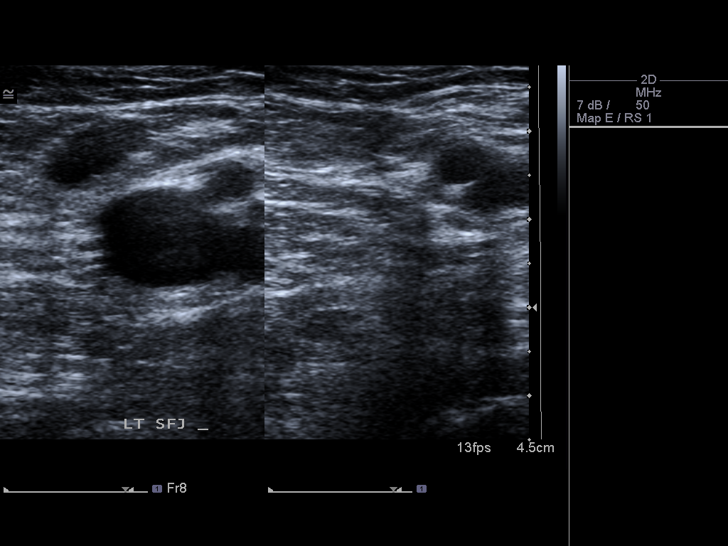
[im 5/25]
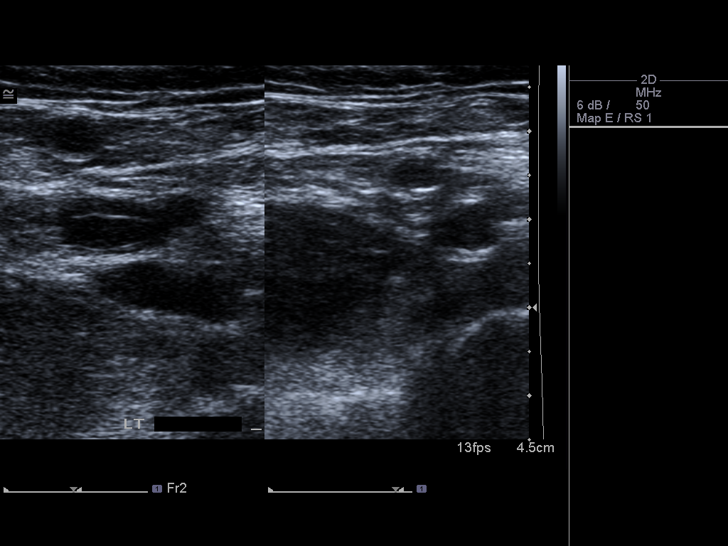
[im 7/25]
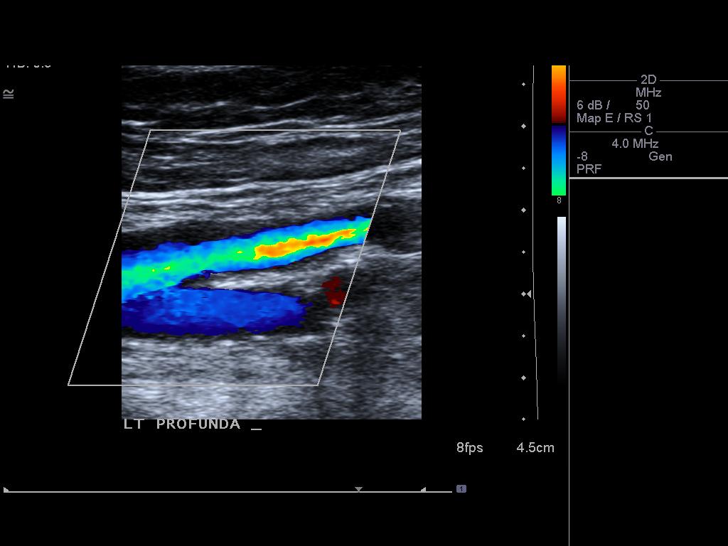
[im 8/25]
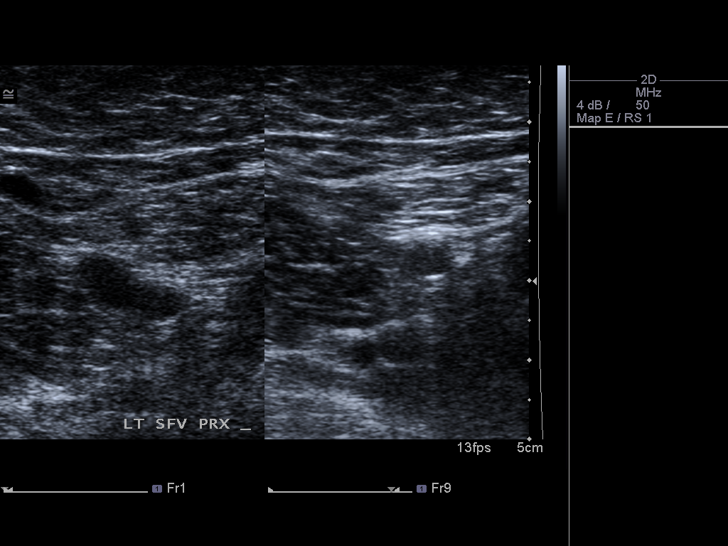
[im 10/25]
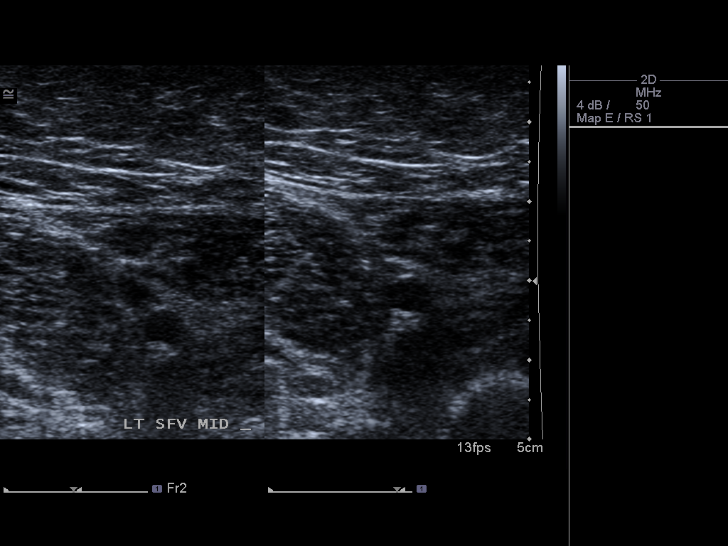
[im 12/25]
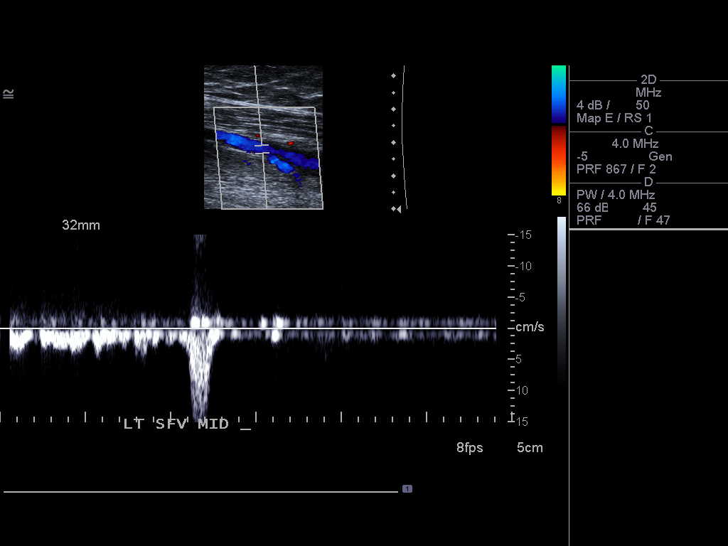
[im 13/25]
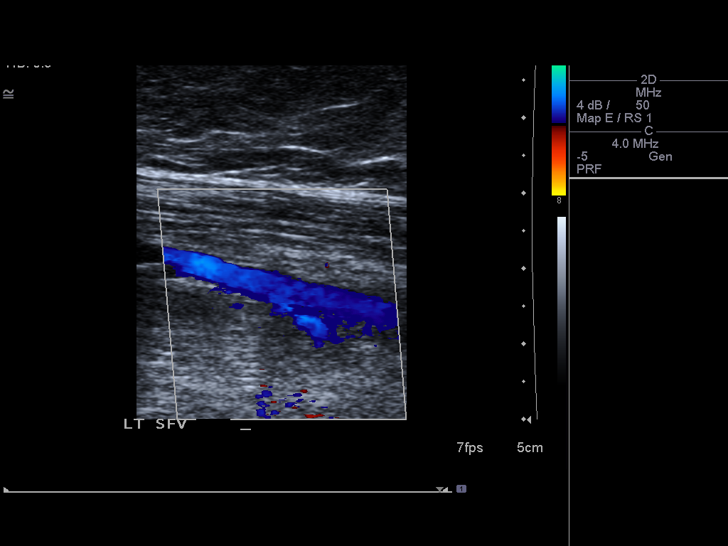
[im 15/25]
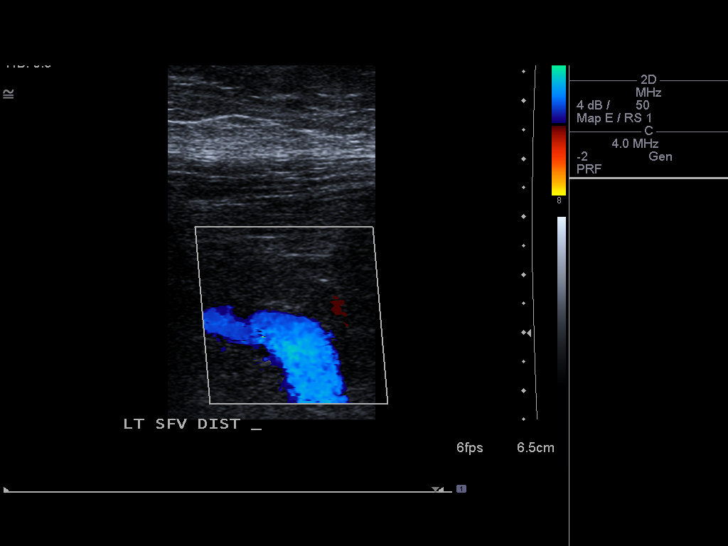
[im 17/25]
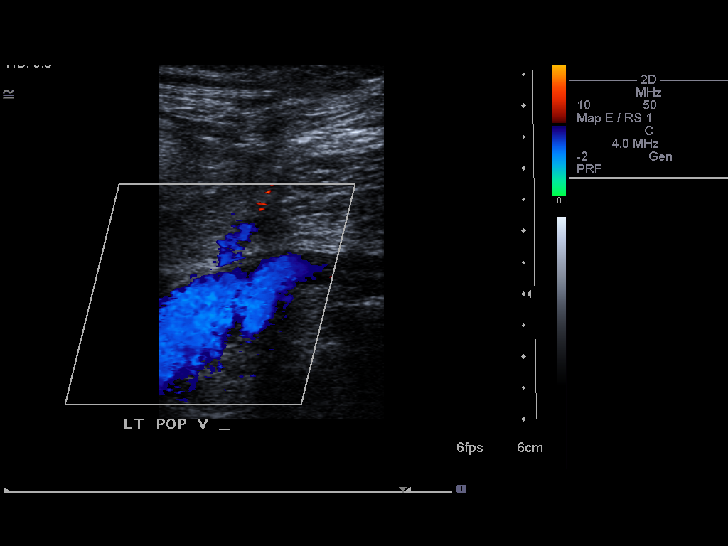
[im 19/25]
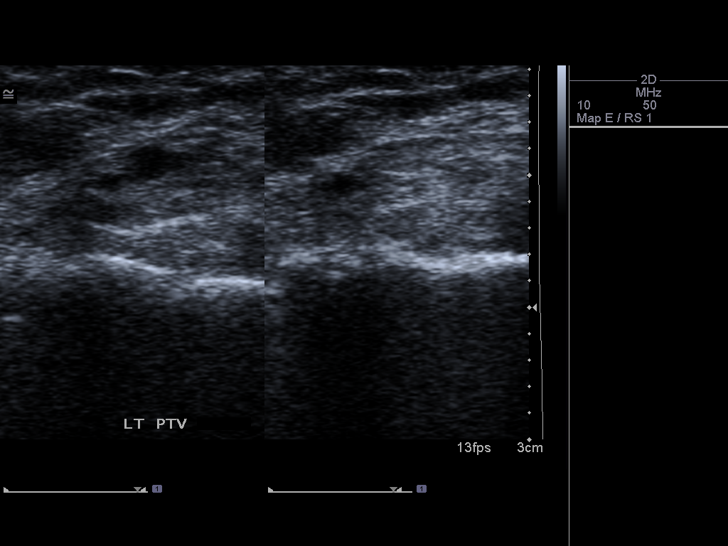
[im 20/25]
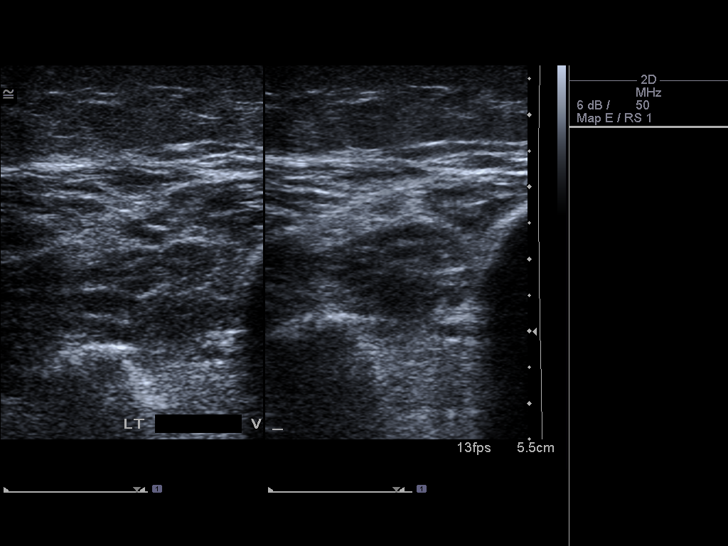
[im 22/25]
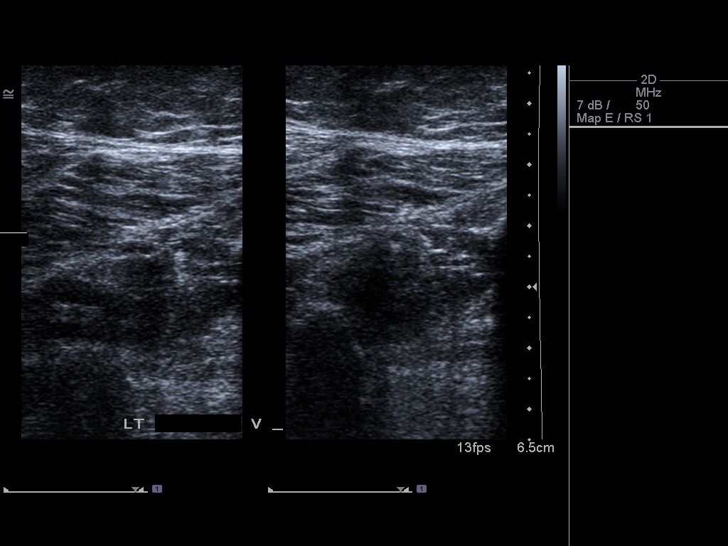
[im 25/25]
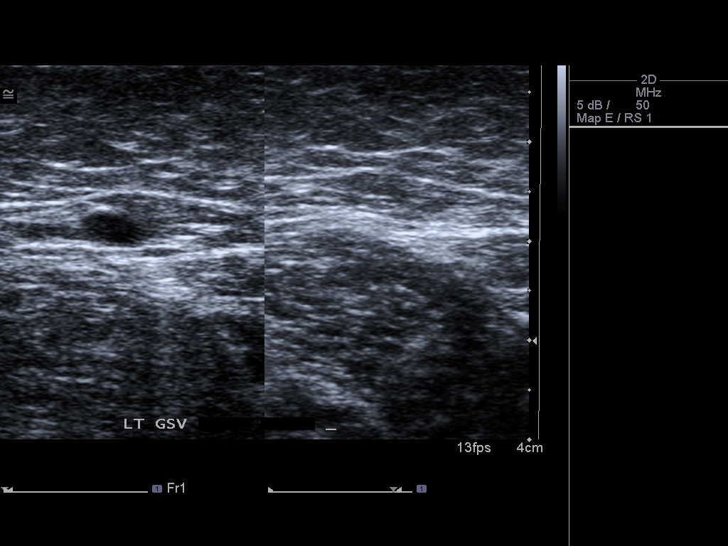

[14 of 24 positions shown; findings below may reference images not displayed]

FINDINGS: Thrombus within deep veins:  None visualized.

Compressibility of deep veins:  Normal.

Duplex waveform respiratory phasicity:  Normal.

Duplex waveform response to augmentation:  Normal.

Venous reflux:  None visualized.

Other findings:  None visualized.
IMPRESSION: No evidence of acute deep venous thrombosis.

## 2013-03-09 IMAGING — CR DG KNEE 1-2V*L*
3 series · 3 of 3 positions shown · non-contrast
Comparison: None.

CLINICAL DATA: Left knee pain for the past 2 days. No known injury.

EXAM:
LEFT KNEE - 1-2 VIEW

[t knee ap left]
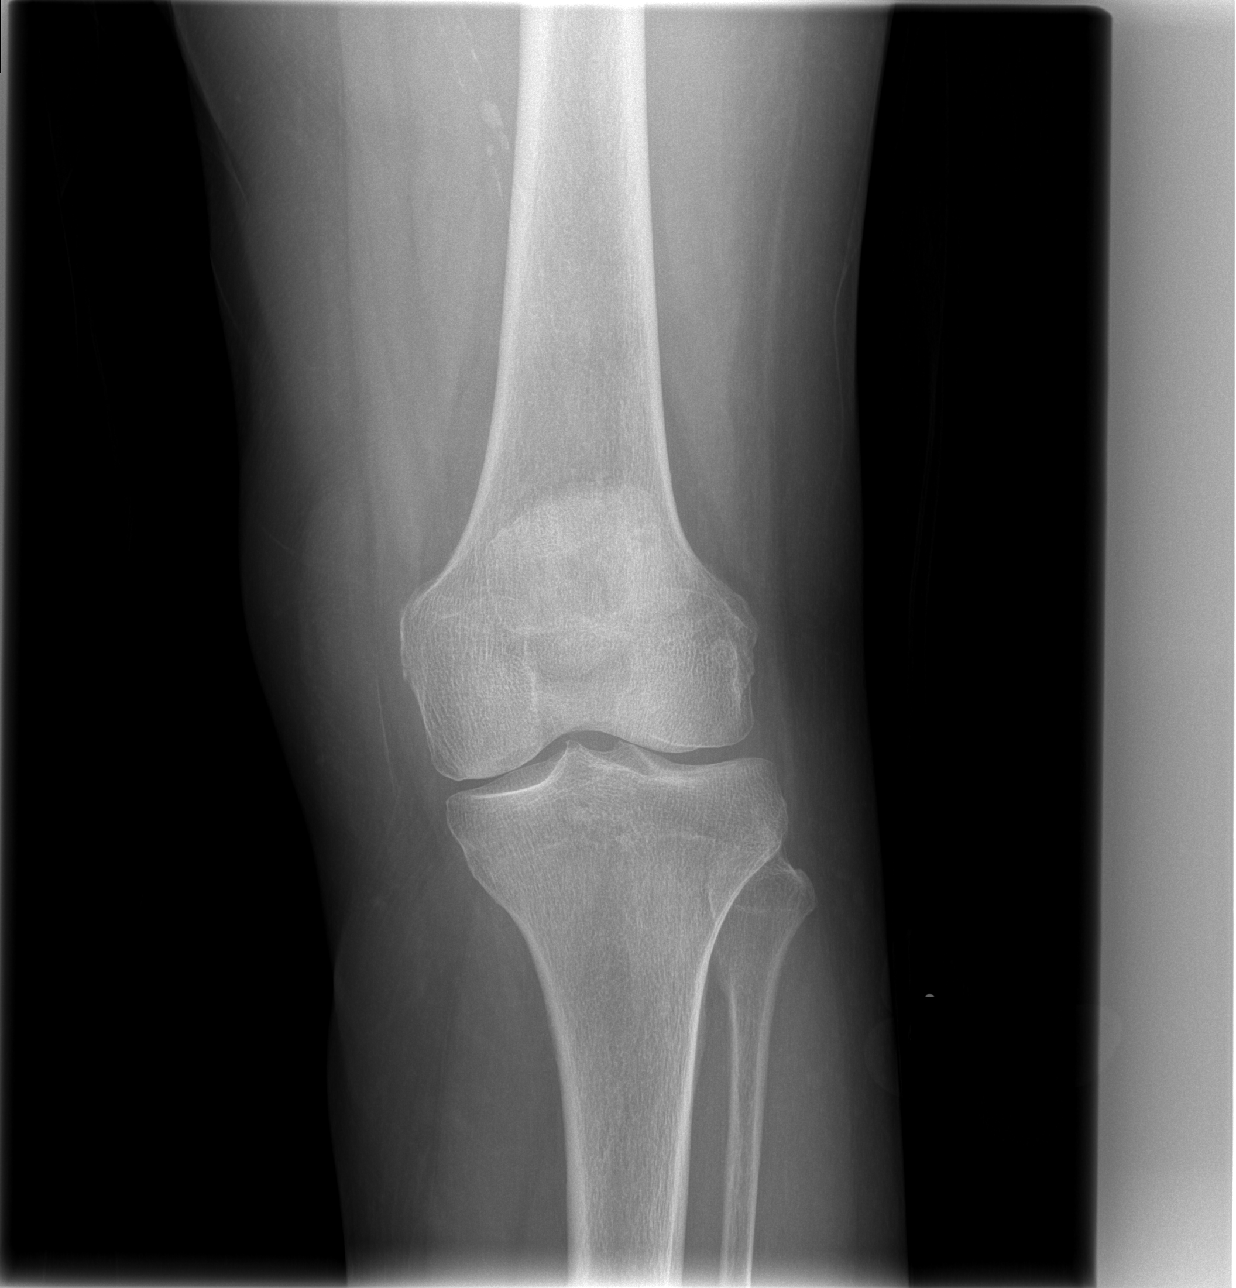

[t knee lat left]
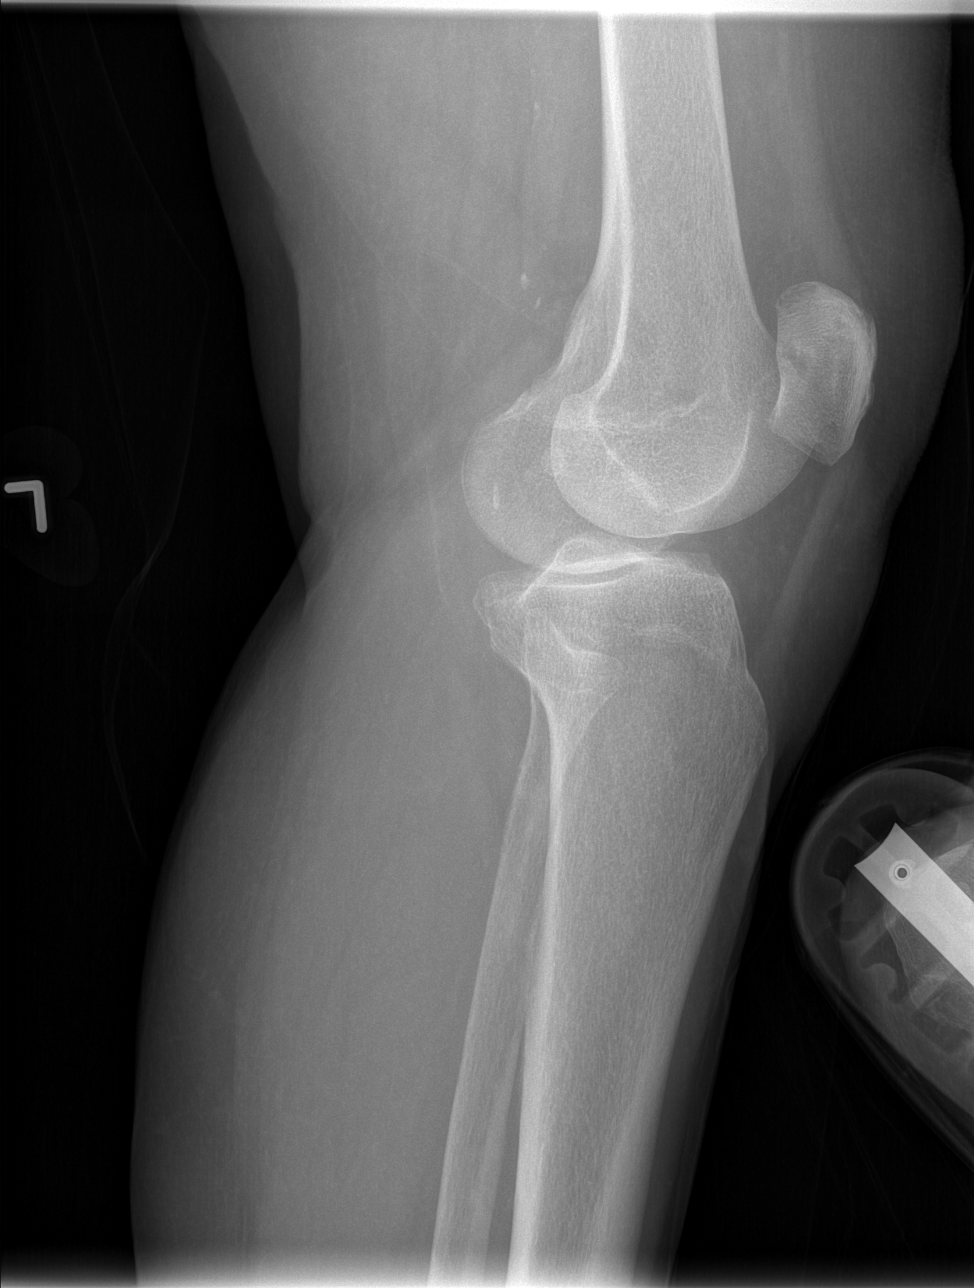

[view not recorded]
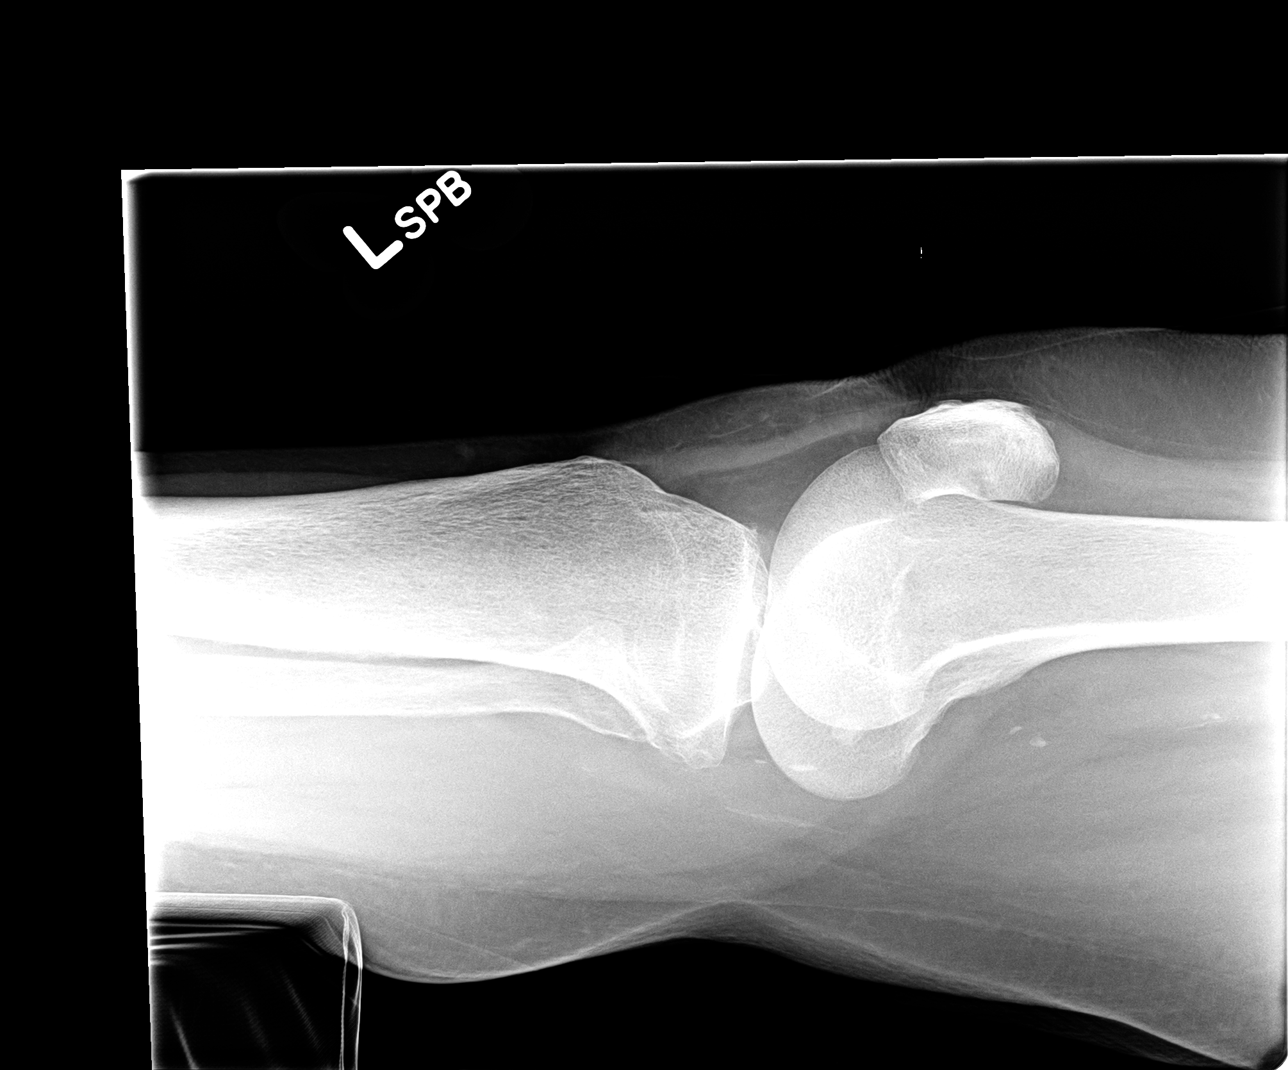

[3 of 3 positions shown; findings below may reference images not displayed]

FINDINGS: There is no evidence of fracture, dislocation, or joint effusion.
There is no evidence of arthropathy or other focal bone abnormality.
Soft tissues are unremarkable.
IMPRESSION: Normal examination.

## 2013-03-09 IMAGING — CR DG HIP (WITH OR WITHOUT PELVIS) 2-3V*L*
3 series · 3 of 3 positions shown · non-contrast
Comparison: CT [DATE]

CLINICAL DATA: Left hip and left knee pain.

EXAM:
LEFT HIP - COMPLETE 2+ VIEW

[t pelvis a.p.]
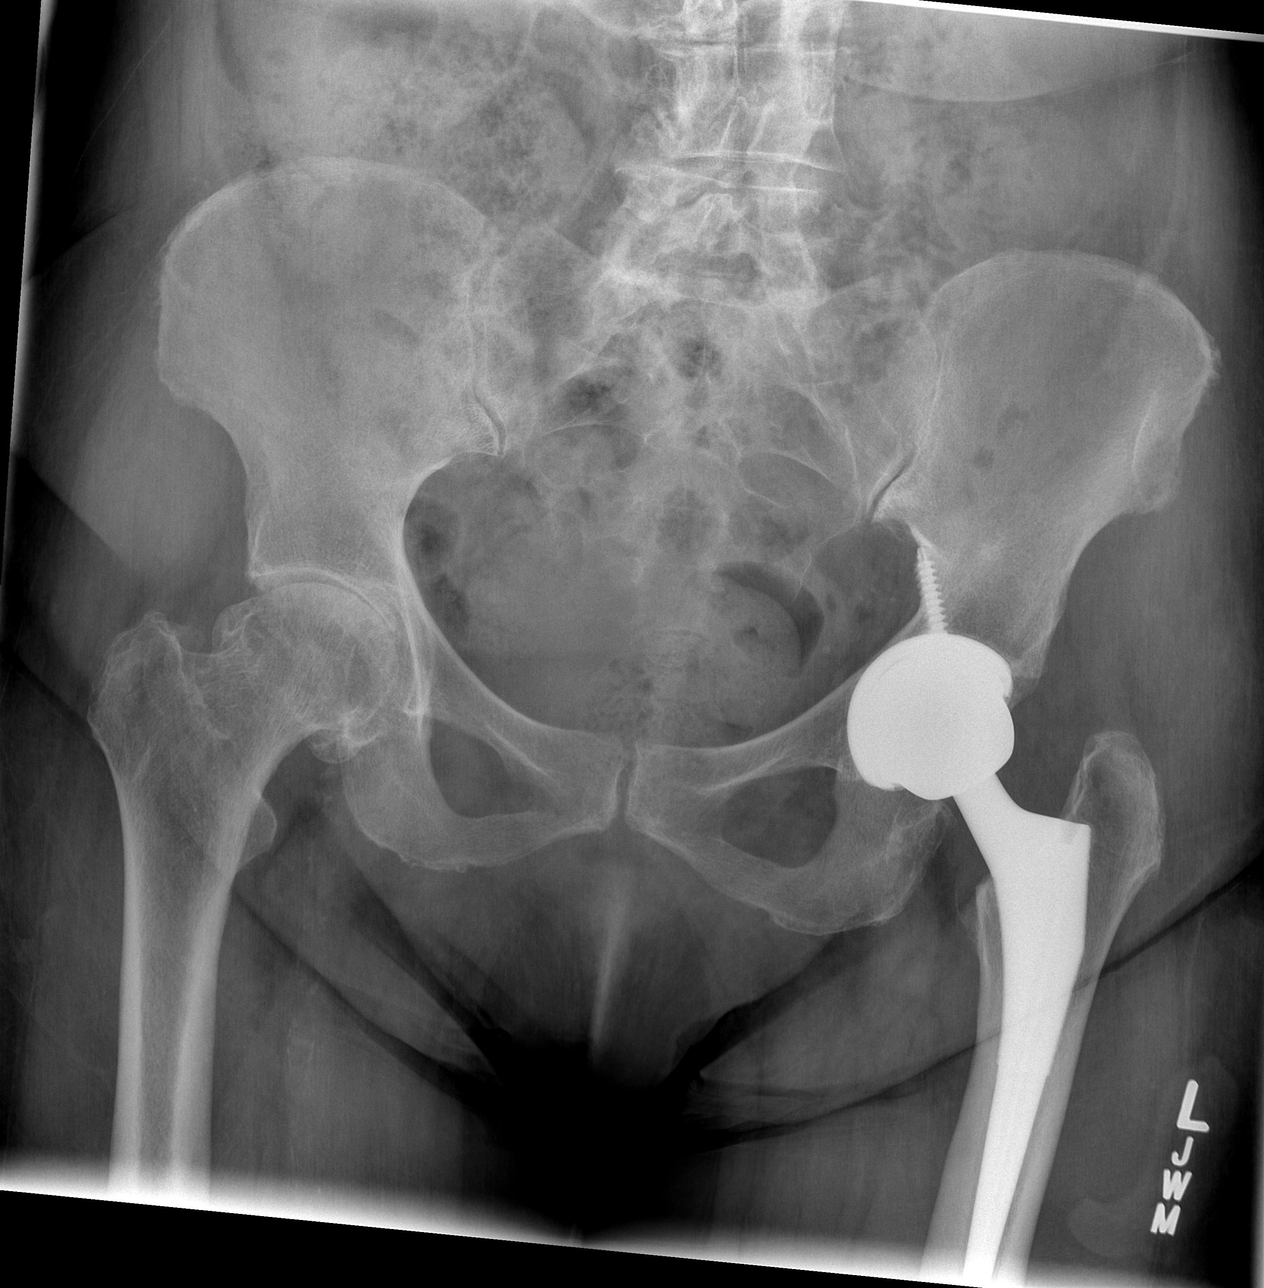

[t hip ap left]
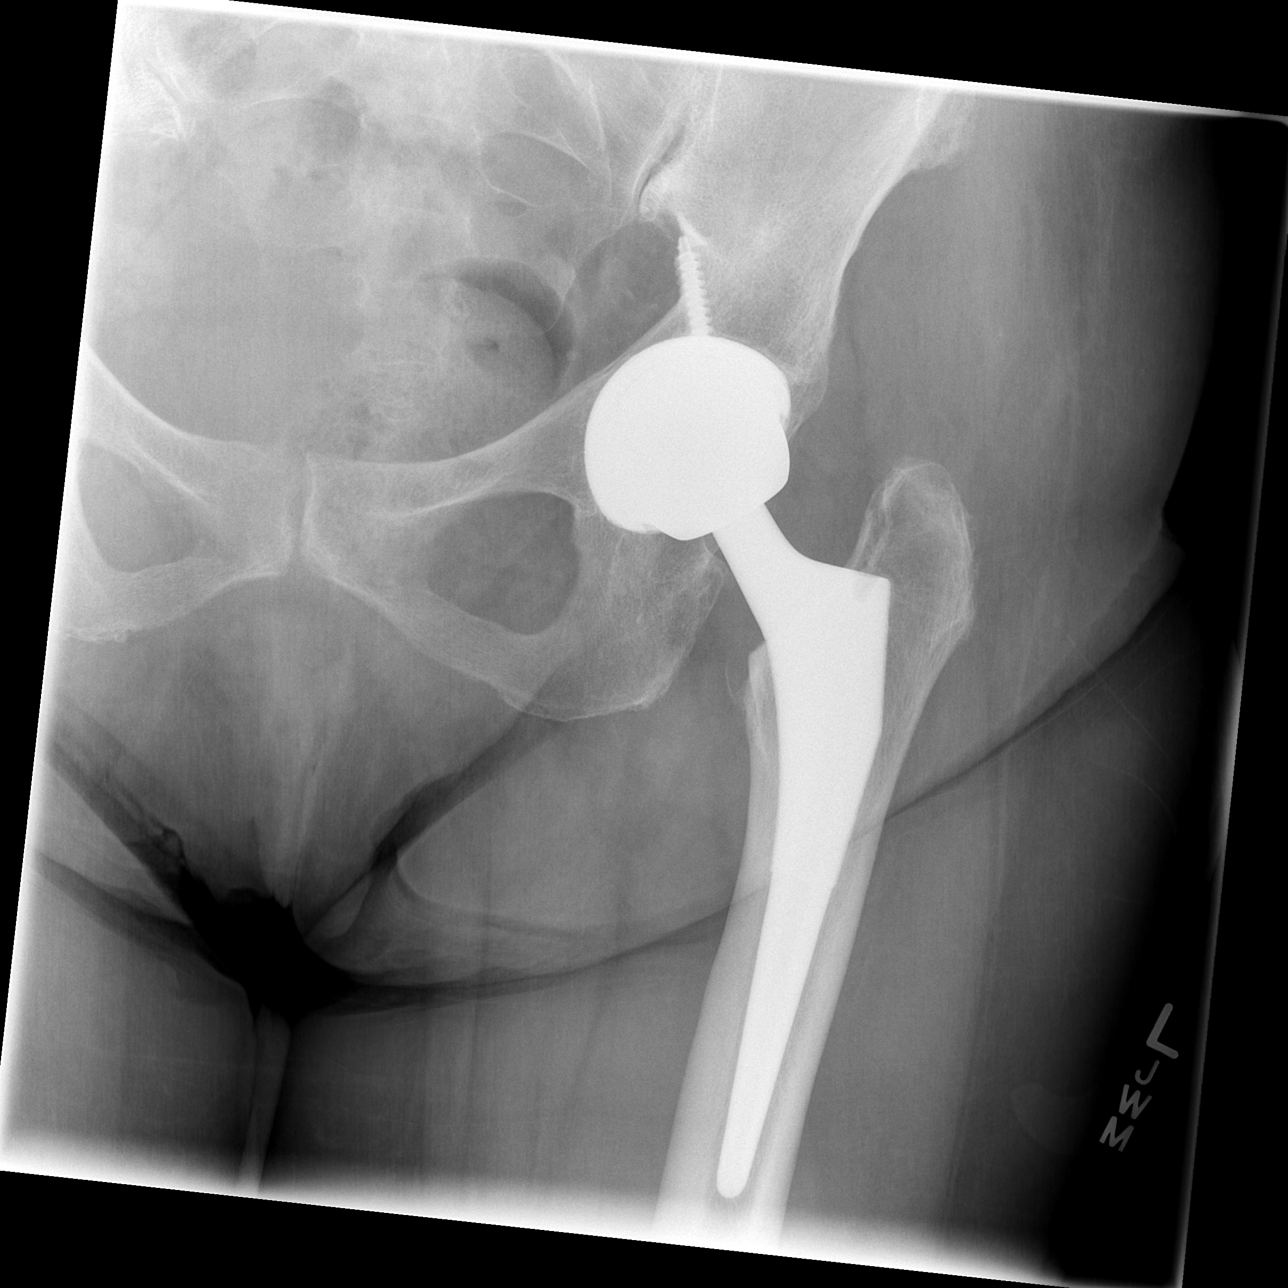

[t hip frog leg left]
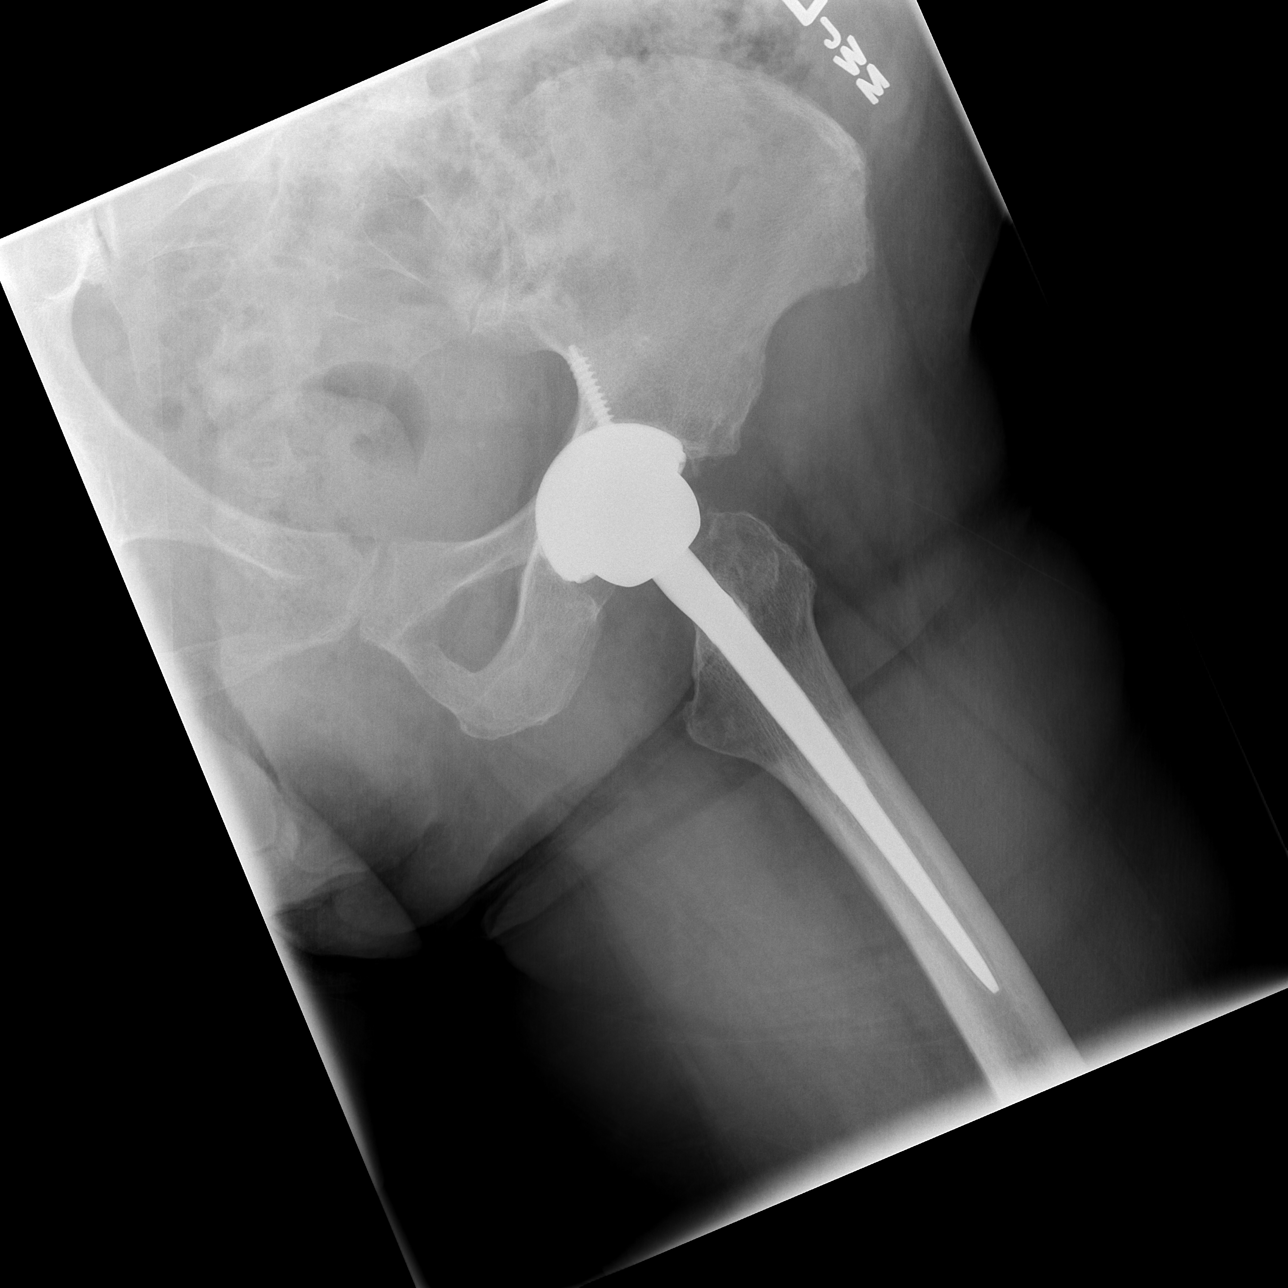

[3 of 3 positions shown; findings below may reference images not displayed]

FINDINGS: Total left hip arthroplasty. Joint space narrowing and degenerative
changes in the right hip. Degenerative changes at L4-L5. Pelvic bony
ring is intact. The left hip replacement is located and no evidence
for a periprosthetic fracture.
IMPRESSION: Left hip replacement without complicating features.

Osteoarthritis in the right hip.

Degenerative changes in the lower lumbar spine.

## 2013-03-09 NOTE — ED Notes (Signed)
Left knee pain that started yesterday.  Denies injury.

## 2013-03-09 NOTE — ED Provider Notes (Signed)
CSN: [id number]     Arrival date & time 03/09/13  1226 History   First MD Initiated Contact with Patient 03/09/13 1346     Chief Complaint  Patient presents with  . Knee Pain   (Consider location/radiation/quality/duration/timing/severity/associated sxs/prior Treatment) HPI Comments: 79 yo female with 2 days of left knee pain. This started when she was walking to the bathroom at night, but she does not remember a specific injury. Hurts worse to walk. No hip pain. No numbness or weakness. The pain is moderate in intensity. She has not tried any medications for the pain. She denies any leg swelling. No redness or knee swelling. Denis any fevers.    Past Medical History  Diagnosis Date  . Hypertension   . PAD (peripheral artery disease)   . High cholesterol   . Arthritis   . Heart murmur   . Hx: UTI (urinary tract infection)     Now infrequent   Past Surgical History  Procedure Laterality Date  . Total hip arthroplasty    . Total knee arthroplasty    . Cholecystectomy  10/30/2011    Procedure: LAPAROSCOPIC CHOLECYSTECTOMY WITH INTRAOPERATIVE CHOLANGIOGRAM;  Surgeon: Liz Malady, MD;  Location: [facility] OR;  Service: General;  Laterality: N/A;  . Total shoulder arthroplasty    . Abdominal hysterectomy      partial  . Eus  11/11/2011    Procedure: ESOPHAGEAL ENDOSCOPIC ULTRASOUND (EUS) RADIAL;  Surgeon: Willis Modena, MD;  Location: [facility] ENDOSCOPY;  Service: Endoscopy;  Laterality: N/A;  possible ERCP   Family History  Problem Relation Age of Onset  . Cancer Mother     ovarian   History  Substance Use Topics  . Smoking status: Never Smoker   . Smokeless tobacco: Never Used  . Alcohol Use: No   OB History   Grav Para Term Preterm Abortions TAB SAB Ect Mult Living                 Review of Systems  Constitutional: Negative for fever and chills.  Respiratory: Negative for shortness of breath.   Cardiovascular: Negative for chest pain.  Musculoskeletal: Negative for back  pain, joint swelling and myalgias.  Skin: Negative for color change.  Neurological: Negative for weakness and numbness.  All other systems reviewed and are negative.    Allergies  Penicillins and Sulfa antibiotics  Home Medications   Current Outpatient Rx  Name  Route  Sig  Dispense  Refill  . amLODipine-valsartan (EXFORGE) 5-320 MG per tablet   Oral   Take 1 tablet by mouth daily.         Marland Kitchen aspirin 81 MG tablet   Oral   Take 81 mg by mouth daily.         Marland Kitchen atenolol (TENORMIN) 50 MG tablet   Oral   Take 50 mg by mouth daily.         . cholecalciferol (VITAMIN D) 1000 UNITS tablet   Oral   Take 1,000 Units by mouth daily.         Marland Kitchen doxazosin (CARDURA) 8 MG tablet   Oral   Take 8 mg by mouth at bedtime.         . rosuvastatin (CRESTOR) 5 MG tablet   Oral   Take 5 mg by mouth daily.          BP 117/73  Pulse 58  Temp(Src) 98.3 F (36.8 C) (Oral)  Resp 16  Ht 5\' 4"  (1.626 m)  Wt 162  lb (73.483 kg)  BMI 27.79 kg/m2  SpO2 99% Physical Exam  Nursing note and vitals reviewed. Constitutional: She is oriented to person, place, and time. She appears well-developed and well-nourished. No distress.  HENT:  Head: Normocephalic and atraumatic.  Right Ear: External ear normal.  Left Ear: External ear normal.  Nose: Nose normal.  Eyes: Right eye exhibits no discharge. Left eye exhibits no discharge.  Cardiovascular:  Pulses:      Dorsalis pedis pulses are 2+ on the right side, and 2+ on the left side.       Posterior tibial pulses are 2+ on the right side, and 2+ on the left side.  Pulmonary/Chest: Effort normal.  Abdominal: She exhibits no distension.  Musculoskeletal:       Left hip: She exhibits no tenderness.       Left knee: She exhibits normal range of motion, no swelling and no effusion. Tenderness found.  Neurological: She is alert and oriented to person, place, and time. She has normal strength. No sensory deficit.  Limping gait but is able to  bear weight  Skin: Skin is warm and dry. No erythema.    ED Course  Procedures (including critical care time) Labs Review Labs Reviewed - No data to display Imaging Review Dg Hip Complete Left  03/09/2013   CLINICAL DATA:  Left hip and left knee pain.  EXAM: LEFT HIP - COMPLETE 2+ VIEW  COMPARISON:  CT 06/21/2007  FINDINGS: Total left hip arthroplasty. Joint space narrowing and degenerative changes in the right hip. Degenerative changes at L4-L5. Pelvic bony ring is intact. The left hip replacement is located and no evidence for a periprosthetic fracture.  IMPRESSION: Left hip replacement without complicating features.  Osteoarthritis in the right hip.  Degenerative changes in the lower lumbar spine.   Electronically Signed   By: Richarda Overlie M.D.   On: 03/09/2013 15:16   Dg Knee 1-2 Views Left  03/09/2013   CLINICAL DATA:  Left knee pain for the past 2 days. No known injury.  EXAM: LEFT KNEE - 1-2 VIEW  COMPARISON:  None.  FINDINGS: There is no evidence of fracture, dislocation, or joint effusion. There is no evidence of arthropathy or other focal bone abnormality. Soft tissues are unremarkable.  IMPRESSION: Normal examination.   Electronically Signed   By: Gordan Payment M.D.   On: 03/09/2013 13:02   US Venous Img Lower Unilateral Left  03/09/2013   CLINICAL DATA:  Left leg pain  EXAM: VENOUS DOPPLER ULTRASOUND OF LEFT LOWER EXTREMITY  TECHNIQUE: Gray-scale sonography with graded compression, as well as color Doppler and duplex ultrasound, were performed to evaluate the deep venous system from the level of the common femoral vein through the popliteal and proximal calf veins. Spectral Doppler was utilized to evaluate flow at rest and with distal augmentation maneuvers.  COMPARISON:  None.  FINDINGS: Thrombus within deep veins:  None visualized.  Compressibility of deep veins:  Normal.  Duplex waveform respiratory phasicity:  Normal.  Duplex waveform response to augmentation:  Normal.  Venous reflux:   None visualized.  Other findings:  None visualized.  IMPRESSION: No evidence of acute deep venous thrombosis.   Electronically Signed   By: Alcide Clever M.D.   On: 03/09/2013 15:32    EKG Interpretation   None       MDM   1. Left knee pain    Her knee has no effusion or erythema to suggest a septic joint. Has full ROM of  knee. Normal pulses in knee. ABIs were checked given her remote PAD hx, and are 1.09 on right and 0.75 on left. I discussed this with the vascular surgeon on call Dr. Imogene Burn, who states this is not low enough of an ABI to cause significant symptoms or be a threat to her leg. He recommends no more testing in ED and will f/u in clinic. There is no evidence of a DVT. Will treat symptomatically and have her f/u with vascular and her ortho as an outpatient.     Audree Camel, MD 03/10/13 [phone]

## 2013-03-15 ENCOUNTER — Other Ambulatory Visit: Payer: Self-pay | Admitting: *Deleted

## 2013-03-15 DIAGNOSIS — M79609 Pain in unspecified limb: Secondary | ICD-10-CM

## 2013-03-15 DIAGNOSIS — I739 Peripheral vascular disease, unspecified: Secondary | ICD-10-CM

## 2013-04-03 ENCOUNTER — Encounter: Payer: Self-pay | Admitting: Vascular Surgery

## 2013-04-04 ENCOUNTER — Ambulatory Visit (INDEPENDENT_AMBULATORY_CARE_PROVIDER_SITE_OTHER)
Admission: RE | Admit: 2013-04-04 | Discharge: 2013-04-04 | Disposition: A | Discharge status: Home / Self Care | Payer: Medicare Other | Source: Ambulatory Visit | Attending: Vascular Surgery | Admitting: Vascular Surgery

## 2013-04-04 ENCOUNTER — Ambulatory Visit (INDEPENDENT_AMBULATORY_CARE_PROVIDER_SITE_OTHER): Payer: Medicare Other | Admitting: Vascular Surgery

## 2013-04-04 ENCOUNTER — Ambulatory Visit (HOSPITAL_COMMUNITY)
Admission: RE | Admit: 2013-04-04 | Discharge: 2013-04-04 | Disposition: A | Discharge status: Home / Self Care | Payer: Medicare Other | Source: Ambulatory Visit | Attending: Vascular Surgery | Admitting: Vascular Surgery

## 2013-04-04 ENCOUNTER — Encounter: Payer: Self-pay | Admitting: Vascular Surgery

## 2013-04-04 VITALS — BP 136/60 | HR 50 | Resp 16 | Ht 64.0 in | Wt 162.0 lb

## 2013-04-04 DIAGNOSIS — M79609 Pain in unspecified limb: Secondary | ICD-10-CM

## 2013-04-04 DIAGNOSIS — I739 Peripheral vascular disease, unspecified: Secondary | ICD-10-CM | POA: Diagnosis not present

## 2013-04-04 DIAGNOSIS — M25562 Pain in left knee: Secondary | ICD-10-CM

## 2013-04-04 HISTORY — DX: Pain in left knee: M25.562

## 2013-04-04 NOTE — Progress Notes (Signed)
Subjective:     Patient ID: [name], female   DOB: 09/29/1930, 79 y.o.   MRN: [id number]  HPI this 79 year old female was referred for evaluation of left leg pain from the med center [facility] emergency department. The patient started having left knee pain about one week ago. She states her ambulation is somewhat unsteady. She has a remote history of PAD made by Dr. Charolette Child several years ago. Patient has no history of gangrene, infection, cellulitis, or nonhealing ulcers. She denies any classic claudication symptoms. She has had previous joint problems with the right knee replacement, left hip replacement, and shoulder surgery.  Past Medical History  Diagnosis Date  . Hypertension   . PAD (peripheral artery disease)   . High cholesterol   . Arthritis   . Heart murmur   . Hx: UTI (urinary tract infection)     Now infrequent  . Left knee pain Nov. 18, 2014    History  Substance Use Topics  . Smoking status: Never Smoker   . Smokeless tobacco: Never Used  . Alcohol Use: No    Family History  Problem Relation Age of Onset  . Cancer Mother     ovarian  . Pneumonia Father   . Heart attack Brother   . Heart disease Brother     Heart Disease before age 63    Allergies  Allergen Reactions  . Penicillins   . Sulfa Antibiotics     Current outpatient prescriptions:amLODipine-valsartan (EXFORGE) 5-320 MG per tablet, Take 1 tablet by mouth daily., Disp: , Rfl: ;  aspirin 81 MG tablet, Take 81 mg by mouth daily., Disp: , Rfl: ;  atenolol (TENORMIN) 50 MG tablet, Take 50 mg by mouth daily., Disp: , Rfl: ;  cholecalciferol (VITAMIN D) 1000 UNITS tablet, Take 1,000 Units by mouth daily., Disp: , Rfl:  cilostazol (PLETAL) 100 MG tablet, Take 100 mg by mouth 2 (two) times daily., Disp: , Rfl: ;  doxazosin (CARDURA) 8 MG tablet, Take 4 mg by mouth 2 (two) times daily. , Disp: , Rfl: ;  rosuvastatin (CRESTOR) 5 MG tablet, Take 5 mg by mouth daily., Disp: , Rfl:   BP 136/60  Pulse  50  Resp 16  Ht 5\' 4"  (1.626 m)  Wt 162 lb (73.483 kg)  BMI 27.79 kg/m2  SpO2 100%  Body mass index is 27.79 kg/(m^2).           Review of Systems denies chest pain, dyspnea on exertion, PND, orthopnea, hemoptysis, does complain of history of left hip replacement and right knee replacement and right shoulder surgery. Also has chronic back pain from arthritis. All other systems negative and a complete review of systems     Objective:   Physical Exam BP 136/60  Pulse 50  Resp 16  Ht 5\' 4"  (1.626 m)  Wt 162 lb (73.483 kg)  BMI 27.79 kg/m2  SpO2 100%  Gen.-alert and oriented x3 in no apparent distress HEENT normal for age Lungs no rhonchi or wheezing Cardiovascular regular rhythm no murmurs carotid pulses 3+ palpable no bruits audible Abdomen soft nontender no palpable masses Musculoskeletal free of  major deformities Skin clear -no rashes Neurologic normal Lower extremities 3+ femoral and dorsalis pedis pulses palpable bilaterally with no edema  Today I ordered lower short arterial Doppler studies with a duplex scan which are reviewed and interpreted. ABI on the right is 1.07 on the left is 0.72. Duplex scan reveals a segmental occlusion of the proximal left superficial femoral  artery but otherwise patent vessels.       Assessment:     Left knee pain due to osteoarthritis likely-status post right knee replacement in the past by Dr. Valma Cava Chronic left superficial femoral occlusion-asymptomatic    Plan:     Vascular disease requires no treatment unless patient develops limb threatening ischemia or severe claudication. Would refer back to Dr. Thomasena Edis for evaluation of left knee for likely osteoarthritis

## 2013-05-03 DIAGNOSIS — R7309 Other abnormal glucose: Secondary | ICD-10-CM | POA: Diagnosis not present

## 2013-05-03 DIAGNOSIS — E7800 Pure hypercholesterolemia, unspecified: Secondary | ICD-10-CM | POA: Diagnosis not present

## 2013-05-03 DIAGNOSIS — I10 Essential (primary) hypertension: Secondary | ICD-10-CM | POA: Diagnosis not present

## 2013-05-03 DIAGNOSIS — E559 Vitamin D deficiency, unspecified: Secondary | ICD-10-CM | POA: Diagnosis not present

## 2013-06-12 DIAGNOSIS — Z1231 Encounter for screening mammogram for malignant neoplasm of breast: Secondary | ICD-10-CM | POA: Diagnosis not present

## 2013-07-27 DIAGNOSIS — E559 Vitamin D deficiency, unspecified: Secondary | ICD-10-CM | POA: Diagnosis not present

## 2013-07-27 DIAGNOSIS — M47812 Spondylosis without myelopathy or radiculopathy, cervical region: Secondary | ICD-10-CM | POA: Diagnosis not present

## 2013-07-27 DIAGNOSIS — E7800 Pure hypercholesterolemia, unspecified: Secondary | ICD-10-CM | POA: Diagnosis not present

## 2013-07-27 DIAGNOSIS — M549 Dorsalgia, unspecified: Secondary | ICD-10-CM | POA: Diagnosis not present

## 2013-07-27 DIAGNOSIS — M5030 Other cervical disc degeneration, unspecified cervical region: Secondary | ICD-10-CM | POA: Diagnosis not present

## 2013-07-27 DIAGNOSIS — M47814 Spondylosis without myelopathy or radiculopathy, thoracic region: Secondary | ICD-10-CM | POA: Diagnosis not present

## 2013-07-27 DIAGNOSIS — M899 Disorder of bone, unspecified: Secondary | ICD-10-CM | POA: Diagnosis not present

## 2013-07-27 DIAGNOSIS — I10 Essential (primary) hypertension: Secondary | ICD-10-CM | POA: Diagnosis not present

## 2013-07-27 DIAGNOSIS — M949 Disorder of cartilage, unspecified: Secondary | ICD-10-CM | POA: Diagnosis not present

## 2013-08-01 DIAGNOSIS — I10 Essential (primary) hypertension: Secondary | ICD-10-CM | POA: Diagnosis not present

## 2013-08-01 DIAGNOSIS — E559 Vitamin D deficiency, unspecified: Secondary | ICD-10-CM | POA: Diagnosis not present

## 2013-08-08 DIAGNOSIS — E559 Vitamin D deficiency, unspecified: Secondary | ICD-10-CM | POA: Diagnosis not present

## 2013-08-08 DIAGNOSIS — E7800 Pure hypercholesterolemia, unspecified: Secondary | ICD-10-CM | POA: Diagnosis not present

## 2013-08-08 DIAGNOSIS — R7309 Other abnormal glucose: Secondary | ICD-10-CM | POA: Diagnosis not present

## 2013-08-08 DIAGNOSIS — I10 Essential (primary) hypertension: Secondary | ICD-10-CM | POA: Diagnosis not present

## 2013-09-12 DIAGNOSIS — R42 Dizziness and giddiness: Secondary | ICD-10-CM | POA: Diagnosis not present

## 2013-09-12 DIAGNOSIS — J309 Allergic rhinitis, unspecified: Secondary | ICD-10-CM | POA: Diagnosis not present

## 2013-09-12 DIAGNOSIS — H6980 Other specified disorders of Eustachian tube, unspecified ear: Secondary | ICD-10-CM | POA: Diagnosis not present

## 2013-09-13 DIAGNOSIS — L03039 Cellulitis of unspecified toe: Secondary | ICD-10-CM | POA: Diagnosis not present

## 2013-09-13 DIAGNOSIS — L02619 Cutaneous abscess of unspecified foot: Secondary | ICD-10-CM | POA: Diagnosis not present

## 2013-09-27 DIAGNOSIS — M722 Plantar fascial fibromatosis: Secondary | ICD-10-CM | POA: Diagnosis not present

## 2013-09-27 DIAGNOSIS — L02619 Cutaneous abscess of unspecified foot: Secondary | ICD-10-CM | POA: Diagnosis not present

## 2013-09-27 DIAGNOSIS — L03039 Cellulitis of unspecified toe: Secondary | ICD-10-CM | POA: Diagnosis not present

## 2013-09-27 DIAGNOSIS — M7660 Achilles tendinitis, unspecified leg: Secondary | ICD-10-CM | POA: Diagnosis not present

## 2013-10-11 DIAGNOSIS — L03039 Cellulitis of unspecified toe: Secondary | ICD-10-CM | POA: Diagnosis not present

## 2013-10-11 DIAGNOSIS — M722 Plantar fascial fibromatosis: Secondary | ICD-10-CM | POA: Diagnosis not present

## 2013-10-26 DIAGNOSIS — M722 Plantar fascial fibromatosis: Secondary | ICD-10-CM | POA: Diagnosis not present

## 2013-10-26 DIAGNOSIS — L03039 Cellulitis of unspecified toe: Secondary | ICD-10-CM | POA: Diagnosis not present

## 2013-12-05 DIAGNOSIS — R42 Dizziness and giddiness: Secondary | ICD-10-CM | POA: Diagnosis not present

## 2013-12-07 DIAGNOSIS — L03039 Cellulitis of unspecified toe: Secondary | ICD-10-CM | POA: Diagnosis not present

## 2014-02-01 DIAGNOSIS — M899 Disorder of bone, unspecified: Secondary | ICD-10-CM | POA: Diagnosis not present

## 2014-02-01 DIAGNOSIS — E559 Vitamin D deficiency, unspecified: Secondary | ICD-10-CM | POA: Diagnosis not present

## 2014-02-01 DIAGNOSIS — I10 Essential (primary) hypertension: Secondary | ICD-10-CM | POA: Diagnosis not present

## 2014-02-01 DIAGNOSIS — M949 Disorder of cartilage, unspecified: Secondary | ICD-10-CM | POA: Diagnosis not present

## 2014-02-01 DIAGNOSIS — R7309 Other abnormal glucose: Secondary | ICD-10-CM | POA: Diagnosis not present

## 2014-03-02 DIAGNOSIS — Z23 Encounter for immunization: Secondary | ICD-10-CM | POA: Diagnosis not present

## 2014-05-17 DIAGNOSIS — R739 Hyperglycemia, unspecified: Secondary | ICD-10-CM | POA: Diagnosis not present

## 2014-05-17 DIAGNOSIS — E780 Pure hypercholesterolemia: Secondary | ICD-10-CM | POA: Diagnosis not present

## 2014-05-17 DIAGNOSIS — M949 Disorder of cartilage, unspecified: Secondary | ICD-10-CM | POA: Diagnosis not present

## 2014-05-17 DIAGNOSIS — I10 Essential (primary) hypertension: Secondary | ICD-10-CM | POA: Diagnosis not present

## 2014-06-13 DIAGNOSIS — Z1231 Encounter for screening mammogram for malignant neoplasm of breast: Secondary | ICD-10-CM | POA: Diagnosis not present

## 2014-07-24 DIAGNOSIS — R42 Dizziness and giddiness: Secondary | ICD-10-CM | POA: Diagnosis not present

## 2014-07-24 DIAGNOSIS — L8990 Pressure ulcer of unspecified site, unspecified stage: Secondary | ICD-10-CM | POA: Diagnosis not present

## 2014-07-31 ENCOUNTER — Ambulatory Visit: Payer: Medicare Other | Attending: Internal Medicine | Admitting: Rehabilitative and Restorative Service Providers"

## 2014-07-31 DIAGNOSIS — Z96642 Presence of left artificial hip joint: Secondary | ICD-10-CM | POA: Insufficient documentation

## 2014-07-31 DIAGNOSIS — I739 Peripheral vascular disease, unspecified: Secondary | ICD-10-CM | POA: Diagnosis not present

## 2014-07-31 DIAGNOSIS — I10 Essential (primary) hypertension: Secondary | ICD-10-CM | POA: Diagnosis not present

## 2014-07-31 DIAGNOSIS — R269 Unspecified abnormalities of gait and mobility: Secondary | ICD-10-CM | POA: Diagnosis not present

## 2014-07-31 DIAGNOSIS — Z96651 Presence of right artificial knee joint: Secondary | ICD-10-CM | POA: Insufficient documentation

## 2014-07-31 DIAGNOSIS — Z96611 Presence of right artificial shoulder joint: Secondary | ICD-10-CM | POA: Insufficient documentation

## 2014-07-31 DIAGNOSIS — R42 Dizziness and giddiness: Secondary | ICD-10-CM | POA: Diagnosis not present

## 2014-07-31 NOTE — Therapy (Signed)
[facility] [address] [place], [place], [address] Phone: [phone]   Fax:  [phone]  Physical Therapy Evaluation  Patient Details  Name: [name] MRN: [id number] Date of Birth: 08/01/1930 Referring Provider:  Jani Gravel, MD  Encounter Date: 07/31/2014      PT End of Session - 07/31/14 1450    Visit Number 1   Number of Visits 8   Date for PT Re-Evaluation 08/31/14   PT Start Time 0930   PT Stop Time 1027   PT Time Calculation (min) 57 min   Equipment Utilized During Treatment Gait belt   Activity Tolerance Patient tolerated treatment well   Behavior During Therapy [facility] for tasks assessed/performed      Past Medical History  Diagnosis Date  . Hypertension   . PAD (peripheral artery disease)   . High cholesterol   . Arthritis   . Heart murmur   . Hx: UTI (urinary tract infection)     Now infrequent  . Left knee pain Nov. 18, 2014    Past Surgical History  Procedure Laterality Date  . Total hip arthroplasty    . Total knee arthroplasty    . Total shoulder arthroplasty    . Abdominal hysterectomy      partial  . Eus  11/11/2011    Procedure: ESOPHAGEAL ENDOSCOPIC ULTRASOUND (EUS) RADIAL;  Surgeon: Arta Silence, MD;  Location: [facility];  Service: Endoscopy;  Laterality: N/A;  possible ERCP  . Joint replacement Left 06-22-06    Hip  . Joint replacement Right 11-26-06    Shoulder  . Joint replacement Right 02-27-06    Knee  . Cholecystectomy  10/30/2011    Procedure: LAPAROSCOPIC CHOLECYSTECTOMY WITH INTRAOPERATIVE CHOLANGIOGRAM;  Surgeon: Zenovia Jarred, MD;  Location: [place];  Service: General;  Laterality: N/A;    There were no vitals filed for this visit.  Visit Diagnosis:  Abnormality of gait      Subjective Assessment - 07/31/14 0936    Symptoms The patient reports initial onset of vertigo in 01/2014 that cleared up with use of zyrtec. She then had another episode in 04/2014  that zyrtec did not help and switched to meclizine, which helped.  Since that time, she has had 2 more severe episodes.  The last episode occurred in the evening and the patient took meclizine.  She describes the sensation as "heavy in my head" across the frontal aspectof her head.  She also notes a prior time where she had environmental movement.  She avoids rolling in bed and has vertigo if she got up quickly described as "everything is going around".  Her episodes last for up to a week, in which she is more sedentary and stays in bed.  The dizziness is not constant during that time, but fluctuates with movement.            [facility] PT Assessment - 07/31/14 0948    Assessment   Medical Diagnosis vertigo   Onset Date --  recent episode late 06/2014   Prior Therapy none   Precautions   Precautions Fall   Balance Screen   Has the patient fallen in the past 6 months No   Has the patient had a decrease in activity level because of a fear of falling?  No   Is the patient reluctant to leave their home because of a fear of falling?  No   Home Environment   Living Enviornment Private residence   Living  Arrangements Spouse/significant other   Type of [place] to enter   Entrance Stairs-Number of Steps --  2   Home Layout Multi-level   Prior Function   Level of Independence Independent with basic ADLs;Independent with homemaking with ambulation   Cognition   Behaviors --  Husband assists patient with all answers   Observation/Other Assessments   Focus on Therapeutic Outcomes (FOTO)  53%   Other Surveys  --  DHI=72%   ROM / Strength   AROM / PROM / Strength AROM;Strength   Strength   Overall Strength Comments 4/5 bilateral hip flexion, 5/5 R knee flex/ext, 4/5 L knee flex/ext, 4/5 bilat ankle dorsiflexion   Ambulation/Gait   Ambulation/Gait Yes   Ambulation/Gait Assistance 7: Independent   Gait Pattern --  shortened stride length, slowed pace, posterior pelvic  lean   Ambulation Surface Level   Gait velocity 1.97 ft/sec   Stairs Yes   Stairs Assistance 6: Modified independent (Device/Increase time)   Stair Management Technique Two rails;Alternating pattern   Number of Stairs --  4   Standardized Balance Assessment   Standardized Balance Assessment Berg Balance Test   Berg Balance Test   Sit to Stand Able to stand using hands after several tries   Standing Unsupported Able to stand safely 2 minutes   Sitting with Back Unsupported but Feet Supported on Floor or Stool Able to sit safely and securely 2 minutes   Stand to Sit Controls descent by using hands   Transfers Able to transfer safely, definite need of hands   Standing Unsupported with Eyes Closed Able to stand 10 seconds with supervision   Standing Ubsupported with Feet Together Able to place feet together independently and stand for 1 minute with supervision   From Standing, Reach Forward with Outstretched Arm Can reach forward >5 cm safely (2")   From Standing Position, Pick up Object from Floor Able to pick up shoe, needs supervision   From Standing Position, Turn to Look Behind Over each Shoulder Turn sideways only but maintains balance   Turn 360 Degrees Needs close supervision or verbal cueing   Standing Unsupported, Alternately Place Feet on Step/Stool Able to complete >2 steps/needs minimal assist   Standing Unsupported, One Foot in Front Able to take small step independently and hold 30 seconds   Standing on One Leg Tries to lift leg/unable to hold 3 seconds but remains standing independently   Total Score 34/56            Vestibular Assessment - 07/31/14 0951    Symptom Behavior   Type of Dizziness "Funny feeling in head"  some spinning in environment at times   Frequency of Dizziness --  intermittent   Duration of Dizziness --  seconds to minutes/ overall episode lasts a week   Aggravating Factors Turning head quickly;Activity in general   Relieving Factors --   medications: meclizine or zyrtec   Occulomotor Exam   Occulomotor Alignment Normal   Spontaneous Absent   Smooth Pursuits Intact  hard to follow commands/ needs cues to keep head still   Saccades Intact  with cues on performing   Vestibulo-Occular Reflex   VOR 1 Head Only (x 1 viewing) --  intact to slow pace   Positional Testing   Sidelying Test Sidelying Right;Sidelying Left   Horizontal Canal Testing Horizontal Canal Right;Horizontal Canal Left   Sidelying Right   Sidelying Right Symptoms No nystagmus   Sidelying Left   Sidelying Left  Symptoms No nystagmus   Horizontal Canal Right   Horizontal Canal Right Symptoms Normal   Horizontal Canal Left   Horizontal Canal Left Symptoms Normal           PT Education - 08/03/2014 1449    Education provided Yes   Education Details Patient and husband educated on benefits of PT to address imbalance and unsteadiness.     Person(s) Educated Patient;Spouse   Methods Explanation   Comprehension Verbalized understanding           Plan - 08/01/14 1126    Clinical Impression Statement The patient is an 81 yo female that presents to outpatient physical therapy today for evaluation of dizziness and imbalance. She is not currently experiencing the same room spinning sensation that she had a few weeks ago.  At today's visit, the patient was negative for positional testing for BPPV.  She does present with high fall risk per Merrilee Jansky balance testing and has slowed gait speed.  She would benefit from PT for general mobility, balance training, gait training and home safety for fall prevention.  At this time, she and her husband do not wish to schedule further visits until being evaluated by a neurologist.  PT to write goals if she returns and wants to be seen at that time.   PT Next Visit Plan Hold until further evaluation by neurologist.  PT recommends balance training for safety, however patient and husband decline at this time.   Consulted and Agree  with Plan of Care Patient;Family member/caregiver   Family Member Consulted spouse          G-Codes - 08/03/14 1451    Functional Assessment Tool Used Berg=34/56, Gait speed=1.97 ft/sec   Functional Limitation Mobility: Walking and moving around   Mobility: Walking and Moving Around Current Status [phone]) At least 20 percent but less than 40 percent impaired, limited or restricted   Mobility: Walking and Moving Around Goal Status [phone]) At least 20 percent but less than 40 percent impaired, limited or restricted       Problem List Patient Active Problem List   Diagnosis Date Noted  . Pain in limb-Left Knee 04/04/2013  . Peripheral vascular disease, unspecified 04/04/2013  . Choledocholithiasis with obstruction 10/30/2011  . PVD (peripheral vascular disease) 10/29/2011  . Pancreatitis 10/28/2011  . Cholelithiasis 10/28/2011  . UTI (urinary tract infection) 10/28/2011  . Hypertension 10/28/2011  . Hypercholesterolemia 10/28/2011    Rolfe Hartsell, PT 08/01/2014, 11:28 AM  [facility] [address] [place], [place], [address] Phone: [phone]   Fax:  [phone]

## 2014-08-27 ENCOUNTER — Encounter: Payer: Self-pay | Admitting: Neurology

## 2014-08-27 ENCOUNTER — Ambulatory Visit (INDEPENDENT_AMBULATORY_CARE_PROVIDER_SITE_OTHER): Payer: Medicare Other | Admitting: Neurology

## 2014-08-27 VITALS — BP 140/60 | HR 51 | Temp 97.0°F | Resp 16 | Ht 63.0 in | Wt 165.0 lb

## 2014-08-27 DIAGNOSIS — H9312 Tinnitus, left ear: Secondary | ICD-10-CM | POA: Diagnosis not present

## 2014-08-27 DIAGNOSIS — H8190 Unspecified disorder of vestibular function, unspecified ear: Secondary | ICD-10-CM

## 2014-08-27 DIAGNOSIS — H9193 Unspecified hearing loss, bilateral: Secondary | ICD-10-CM

## 2014-08-27 DIAGNOSIS — H8120 Vestibular neuronitis, unspecified ear: Secondary | ICD-10-CM | POA: Diagnosis not present

## 2014-08-27 DIAGNOSIS — R42 Dizziness and giddiness: Secondary | ICD-10-CM

## 2014-08-27 NOTE — Progress Notes (Signed)
Subjective:    Patient ID: [name] is a 81 y.o. female.  HPI     Star Age, MD, PhD [facility] [address] [place], [place] [address]  Dear Dr. Maudie Mercury,   I saw your patient, [name], upon your kind request in my neurologic clinic today for initial consultation of her vertigo. The patient is accompanied by her husband and daughter today. As you know, [name] is an 81 year old right-handed woman with an underlying medical history of hypertension, peripheral vascular disease, coronary artery disease, status post left heart cath, carotid artery stenosis on the right, osteoarthritis, osteopenia, chronic back pain, and former smoking, who has had episodic vertigo for the past 6 months or so. Her first episode happened in September 2015 and she woke up with a sense of spinning sensation. Her symptoms lasted 3-4 days. She was treated symptomatically with meclizine as needed, which she felt was somewhat helpful and also was quite sedating and she was sleeping more after taking it. I reviewed your office note from 07/24/2014 which you kindly included. She was referred to physical therapy for vestibular rehabilitation, but did not require treatments. She is in the process of being referred to ENT. She has never had a brain scan. She had a total of 4 episodes in the last 6 months of vertigo. She's not sure if this is triggered by sudden movements of her head or body. Most of the time this happened in the morning and seems to be exacerbated by stressors. She has never had any one-sided neurological symptoms such as numbness, tingling, slurring of speech or droopy face or recurrent headaches. She is currently without vertigo symptoms and feels at baseline. She does have intermittent ringing in her ears and bilateral hearing loss for which she has bilateral hearing aids.  Her Past Medical History Is Significant For: Past Medical History   Diagnosis Date  . Hypertension   . PAD (peripheral artery disease)   . High cholesterol   . Arthritis   . Heart murmur   . Hx: UTI (urinary tract infection)     Now infrequent  . Left knee pain Nov. 18, 2014  . Vertigo   . PVD (peripheral vascular disease)   . CAD (coronary artery disease)   . Osteoarthritis   . Osteopenia   . Onychomycosis   . Trigger finger, right   . Chronic back pain   . Gall stone pancreatitis     Her Past Surgical History Is Significant For: Past Surgical History  Procedure Laterality Date  . Total hip arthroplasty    . Total knee arthroplasty    . Total shoulder arthroplasty    . Abdominal hysterectomy      partial  . Eus  11/11/2011    Procedure: ESOPHAGEAL ENDOSCOPIC ULTRASOUND (EUS) RADIAL;  Surgeon: Arta Silence, MD;  Location: [facility];  Service: Endoscopy;  Laterality: N/A;  possible ERCP  . Joint replacement Left 06-22-06    Hip  . Joint replacement Right 11-26-06    Shoulder  . Joint replacement Right 02-27-06    Knee  . Cholecystectomy  10/30/2011    Procedure: LAPAROSCOPIC CHOLECYSTECTOMY WITH INTRAOPERATIVE CHOLANGIOGRAM;  Surgeon: Zenovia Jarred, MD;  Location: [facility];  Service: General;  Laterality: N/A;    Her Family History Is Significant For: Family History  Problem Relation Age of Onset  . Cancer Mother     ovarian  . Pneumonia Father   . Heart attack Brother   .  Heart disease Brother     Heart Disease before age 63    Her Social History Is Significant For: History   Social History  . Marital Status: Married    Spouse Name: N/A  . Number of Children: 6  . Years of Education: [employer]   Social History Main Topics  . Smoking status: Never Smoker   . Smokeless tobacco: Never Used  . Alcohol Use: No  . Drug Use: No  . Sexual Activity: Not on file   Other Topics Concern  . None   Social History Narrative   No caffeine use    Her Allergies Are:  Allergies  Allergen Reactions  . Cozaar [Losartan  Potassium] Cough  . Hydrochlorothiazide Other (See Comments)    Hair loss  . Penicillins   . Sulfa Antibiotics   :   Her Current Medications Are:  Outpatient Encounter Prescriptions as of 08/27/2014  Medication Sig  . acetaminophen (TYLENOL) 100 MG/ML solution Take 10 mg/kg by mouth every 4 (four) hours as needed for fever.  Marland Kitchen amLODipine (NORVASC) 5 MG tablet   . aspirin 81 MG tablet Take 81 mg by mouth daily.  Marland Kitchen atenolol (TENORMIN) 50 MG tablet Take 50 mg by mouth daily.  . Biotin 1 MG CAPS Take by mouth.  . cetirizine (ZYRTEC) 5 MG tablet Take 5 mg by mouth daily.  . cholecalciferol (VITAMIN D) 1000 UNITS tablet Take 1,000 Units by mouth daily.  . cilostazol (PLETAL) 100 MG tablet Take 100 mg by mouth 2 (two) times daily.  . Cinnamon 500 MG capsule Take 500 mg by mouth daily.  Marland Kitchen doxazosin (CARDURA) 8 MG tablet   . losartan (COZAAR) 100 MG tablet Take 100 mg by mouth daily.  . meclizine (ANTIVERT) 12.5 MG tablet Take 12.5 mg by mouth 3 (three) times daily as needed for dizziness.  . rosuvastatin (CRESTOR) 5 MG tablet Take 5 mg by mouth daily.  . [DISCONTINUED] amLODipine-valsartan (EXFORGE) 5-320 MG per tablet Take 1 tablet by mouth daily.  . [DISCONTINUED] doxazosin (CARDURA) 8 MG tablet Take 4 mg by mouth 2 (two) times daily.   :   Review of Systems:  Out of a complete 14 point review of systems, all are reviewed and negative with the exception of these symptoms as listed below:   Review of Systems  HENT: Positive for hearing loss.        Spinning sensation   Respiratory:       Snoring   Neurological:       Dizziness  Hematological: Bruises/bleeds easily.    Objective:  Neurologic Exam  Physical Exam Physical Examination:   Filed Vitals:   08/27/14 1424  BP: 140/60  Pulse: 51  Temp: 97 F (36.1 C)  Resp: 16    General Examination: The patient is a very pleasant 81 y.o. female in no acute distress. She appears well-developed and well-nourished and very well  groomed.   HEENT: Normocephalic, atraumatic, pupils are equal, round and reactive to light and accommodation. Funduscopic exam is normal with sharp disc margins noted. Extraocular tracking is good without limitation to gaze excursion or nystagmus noted. Normal smooth pursuit is noted. Hearing is grossly intact. She has bilateral hearing aids. Tympanic membranes are clear bilaterally. Face is symmetric with normal facial animation and normal facial sensation. Speech is clear with no dysarthria noted. There is no hypophonia. There is no lip, neck/head, jaw or voice tremor. Neck is supple with full range of passive and active motion. There are  no carotid bruits on auscultation. Oropharynx exam reveals: mild mouth dryness, good dental hygiene and no significant airway crowding. Mallampati is class II. Tongue protrudes centrally and palate elevates symmetrically.   She has no vertiginous symptoms upon changes in her head position.   Chest: Clear to auscultation without wheezing, rhonchi or crackles noted.  Heart: S1+S2+0, regular and normal without murmurs, rubs or gallops noted.   Abdomen: Soft, non-tender and non-distended with normal bowel sounds appreciated on auscultation.  Extremities: There is no pitting edema in the distal lower extremities bilaterally. Pedal pulses are intact.  Skin: Warm and dry without trophic changes noted. There are no varicose veins.  Musculoskeletal: exam reveals no obvious joint deformities, tenderness or joint swelling or erythema.   Neurologically:  Mental status: The patient is awake, alert and oriented in all 4 spheres. Her immediate and remote memory, attention, language skills and fund of knowledge are appropriate. There is no evidence of aphasia, agnosia, apraxia or anomia. Speech is clear with normal prosody and enunciation. Thought process is linear. Mood is normal and affect is normal.  Cranial nerves II - XII are as described above under HEENT exam. In  addition: shoulder shrug is normal with equal shoulder height noted. Motor exam: Normal bulk, strength and tone is noted. There is no drift, tremor or rebound. Romberg is negative. Reflexes are 2+ throughout. Babinski: Toes are flexor bilaterally. Fine motor skills and coordination: intact with normal finger taps, normal hand movements, normal rapid alternating patting, normal foot taps and normal foot agility.  Cerebellar testing: No dysmetria or intention tremor on finger to nose testing. Heel to shin is unremarkable bilaterally. There is no truncal or gait ataxia.  Sensory exam: intact to light touch, pinprick, vibration, temperature sense in the upper and lower extremities.  Gait, station and balance: She stands with difficulty and reports low back pain. She has a mild degree of scoliosis . No veering to one side is noted. No leaning to one side is noted. Posture is age-appropriate and stance is narrow based. Gait shows slight insecurity but preserved arm swing and fairly good turns. She has no vertigo with changes in body position but just walks a little insecurely. She does have some low back pain with walking as well. She does not complain of any radiating pain. Tandem walk is difficult for her.  Assessment and Plan:   In summary, [name] is a very pleasant 81 y.o.-year old female with an underlying medical history of hypertension, peripheral vascular disease, coronary artery disease, status post left heart cath, carotid artery stenosis on the right, osteoarthritis, osteopenia, chronic back pain, and former smoking, who has had episodic vertigo for the past 6 months. Her history is in keeping with episodic vertigo but not telltale for positional vertigo. She was evaluated by physical therapy and not deemed a candidate for vestibular rehabilitation as I understand. Her neurological exam is nonfocal at this time and she is reassured in that regard. Nevertheless, I would like to proceed with a  brain MRI with and without contrast. She has tried as needed meclizine with some improvement but also some sedation reported. She has not yet seen ENT and is awaiting a referral to ENT as I understand. She is advised that vertigo can return without warning. She is advised to be better hydrated with water and change positions slowly. If her MRI is normal or at least in keeping with age-appropriate findings, I will see her back on an as-needed basis. We will  call her with her brain MRI results. I answered all her questions today and the patient and her family were in agreement. I did not suggest any new blood work from my end of things. She has an appointment with you pending later this month and is encouraged to make sure she has had recent check of her B12 level, and thyroid function.  Thank you very much for allowing me to participate in the care of this nice patient. If I can be of any further assistance to you please do not hesitate to call me at [phone].  Sincerely,   Star Age, MD, PhD

## 2014-08-27 NOTE — Patient Instructions (Addendum)
When you go for your check up with Dr. Maudie Mercury, have him double check your thyroid function, sugar levels, and B12 level.    Please remember, that vertigo can recur without warning. It can last hours or days. Please change positions slowly and always stay well-hydrated. Physical therapy with particular attention to vestibular rehabilitation can be very helpful. While there is no specific medication that helps with vertigo, some people get relief with as needed use of meclizine. Certain medications can exacerbate vertigo.   We will do a brain MRI with and without contrast. We will call you with the results. If this is normal/age appropriate, I can see you back as needed.

## 2014-09-13 DIAGNOSIS — M859 Disorder of bone density and structure, unspecified: Secondary | ICD-10-CM | POA: Diagnosis not present

## 2014-09-13 DIAGNOSIS — Z79899 Other long term (current) drug therapy: Secondary | ICD-10-CM | POA: Diagnosis not present

## 2014-09-13 DIAGNOSIS — R42 Dizziness and giddiness: Secondary | ICD-10-CM | POA: Diagnosis not present

## 2014-09-13 DIAGNOSIS — E559 Vitamin D deficiency, unspecified: Secondary | ICD-10-CM | POA: Diagnosis not present

## 2014-09-13 DIAGNOSIS — E780 Pure hypercholesterolemia: Secondary | ICD-10-CM | POA: Diagnosis not present

## 2014-09-13 DIAGNOSIS — I10 Essential (primary) hypertension: Secondary | ICD-10-CM | POA: Diagnosis not present

## 2014-09-17 ENCOUNTER — Telehealth: Payer: Self-pay | Admitting: Neurology

## 2014-09-17 DIAGNOSIS — F419 Anxiety disorder, unspecified: Secondary | ICD-10-CM

## 2014-09-17 MED ORDER — ALPRAZOLAM 0.5 MG PO TABS: ORAL_TABLET | ORAL | Status: DC

## 2014-09-17 NOTE — Telephone Encounter (Signed)
FAxed to [facility] in [place].

## 2014-09-17 NOTE — Telephone Encounter (Signed)
I called back and spoke with patient.   Answered all questions.

## 2014-09-17 NOTE — Telephone Encounter (Signed)
Patient's daughter is calling as her mother in having an MRI tomorrow and needs a valium to be able to go into machine. Please call into [facility] in [place]. Please call.

## 2014-09-17 NOTE — Telephone Encounter (Signed)
Patient called stating that she received that xanax and has a question on when to take it. Please call and advice

## 2014-09-17 NOTE — Telephone Encounter (Signed)
Okay to take xanax for MRI, Rx written, will fax.

## 2014-09-18 ENCOUNTER — Ambulatory Visit
Admission: RE | Admit: 2014-09-18 | Discharge: 2014-09-18 | Disposition: A | Discharge status: Home / Self Care | Payer: Medicare Other | Source: Ambulatory Visit | Attending: Neurology | Admitting: Neurology

## 2014-09-18 ENCOUNTER — Other Ambulatory Visit: Payer: Self-pay | Admitting: Neurology

## 2014-09-18 DIAGNOSIS — H9312 Tinnitus, left ear: Secondary | ICD-10-CM

## 2014-09-18 DIAGNOSIS — H9193 Unspecified hearing loss, bilateral: Secondary | ICD-10-CM | POA: Diagnosis not present

## 2014-09-18 DIAGNOSIS — R42 Dizziness and giddiness: Secondary | ICD-10-CM

## 2014-09-18 DIAGNOSIS — H8120 Vestibular neuronitis, unspecified ear: Secondary | ICD-10-CM | POA: Diagnosis not present

## 2014-09-18 DIAGNOSIS — H8190 Unspecified disorder of vestibular function, unspecified ear: Secondary | ICD-10-CM

## 2014-09-24 NOTE — Progress Notes (Signed)
Quick Note:  Please call patient regarding the recent brain MRI: The brain scan showed a normal structure of the brain. There were changes in the deeper structures of the brain, which we call white matter changes or microvascular changes. These were reported as moderate in Her case. These are tiny white spots, that occur with time and are seen in a variety of conditions, including with normal aging, chronic hypertension, chronic headaches, especially migraine HAs, chronic diabetes, chronic hyperlipidemia. These are not strokes and no mass or lesion was seen which is reassuring. Again, there were no acute findings, such as a stroke, or mass or blood products. No further action is required on this test at this time, other than re-enforcing the importance of good blood pressure control, good cholesterol control, good blood sugar control, and weight management. Please remind patient to keep any upcoming appointments or tests and to call us with any interim questions, concerns, problems or updates. Thanks,  Star Age, MD, PhD    ______

## 2014-09-25 NOTE — Telephone Encounter (Signed)
-----   Message from Star Age, MD sent at 09/24/2014  5:50 PM EDT ----- Please call patient regarding the recent brain MRI: The brain scan showed a normal structure of the brain. There were changes in the deeper structures of the brain, which we call white matter changes or microvascular changes. These were reported as moderate in Her case. These are tiny white spots, that occur with time and are seen in a variety of conditions, including with normal aging, chronic hypertension, chronic headaches, especially migraine HAs, chronic diabetes, chronic hyperlipidemia. These are not strokes and no mass or lesion was seen which is reassuring. Again, there were no acute findings, such as a stroke, or mass or blood products. No further action is required on this test at this time, other than re-enforcing the importance of good blood pressure control, good cholesterol control, good blood sugar control, and weight management. Please remind patient to keep any upcoming appointments or tests and to call us with any interim questions, concerns, problems or updates. Thanks,  Star Age, MD, PhD

## 2014-09-25 NOTE — Telephone Encounter (Signed)
I spoke to patient and her husband. They are aware of results and voice understanding

## 2014-10-08 ENCOUNTER — Telehealth: Payer: Self-pay | Admitting: Neurology

## 2014-10-08 NOTE — Telephone Encounter (Signed)
Patient had MRI and needs the information and referral results sent back to the ENT Dr. Elwyn Reach.  Patient needs the appt with Dr. Elwyn Reach.

## 2014-10-08 NOTE — Telephone Encounter (Signed)
I spoke to patient. She wants to get referral to ENT. I advised patient that she will need to get referral from PCP. Dr Rexene Alberts agrees. But patient is aware to call us back if any further needs or questions.

## 2014-10-31 DIAGNOSIS — Z961 Presence of intraocular lens: Secondary | ICD-10-CM | POA: Diagnosis not present

## 2015-02-27 DIAGNOSIS — Z23 Encounter for immunization: Secondary | ICD-10-CM | POA: Diagnosis not present

## 2015-03-12 DIAGNOSIS — E559 Vitamin D deficiency, unspecified: Secondary | ICD-10-CM | POA: Diagnosis not present

## 2015-03-12 DIAGNOSIS — I10 Essential (primary) hypertension: Secondary | ICD-10-CM | POA: Diagnosis not present

## 2015-03-19 DIAGNOSIS — E559 Vitamin D deficiency, unspecified: Secondary | ICD-10-CM | POA: Diagnosis not present

## 2015-03-19 DIAGNOSIS — E7800 Pure hypercholesterolemia, unspecified: Secondary | ICD-10-CM | POA: Diagnosis not present

## 2015-03-19 DIAGNOSIS — M8580 Other specified disorders of bone density and structure, unspecified site: Secondary | ICD-10-CM | POA: Diagnosis not present

## 2015-03-19 DIAGNOSIS — I10 Essential (primary) hypertension: Secondary | ICD-10-CM | POA: Diagnosis not present

## 2015-04-08 DIAGNOSIS — M5136 Other intervertebral disc degeneration, lumbar region: Secondary | ICD-10-CM | POA: Diagnosis not present

## 2015-04-08 DIAGNOSIS — M5441 Lumbago with sciatica, right side: Secondary | ICD-10-CM | POA: Diagnosis not present

## 2015-04-08 DIAGNOSIS — M5442 Lumbago with sciatica, left side: Secondary | ICD-10-CM | POA: Diagnosis not present

## 2015-04-08 DIAGNOSIS — M41125 Adolescent idiopathic scoliosis, thoracolumbar region: Secondary | ICD-10-CM | POA: Diagnosis not present

## 2015-04-26 DIAGNOSIS — M4726 Other spondylosis with radiculopathy, lumbar region: Secondary | ICD-10-CM | POA: Diagnosis not present

## 2015-04-26 DIAGNOSIS — M5116 Intervertebral disc disorders with radiculopathy, lumbar region: Secondary | ICD-10-CM | POA: Diagnosis not present

## 2015-04-26 DIAGNOSIS — M5136 Other intervertebral disc degeneration, lumbar region: Secondary | ICD-10-CM | POA: Diagnosis not present

## 2015-04-26 DIAGNOSIS — M419 Scoliosis, unspecified: Secondary | ICD-10-CM | POA: Diagnosis not present

## 2015-05-16 DIAGNOSIS — M5136 Other intervertebral disc degeneration, lumbar region: Secondary | ICD-10-CM | POA: Diagnosis not present

## 2015-05-16 DIAGNOSIS — M41125 Adolescent idiopathic scoliosis, thoracolumbar region: Secondary | ICD-10-CM | POA: Diagnosis not present

## 2015-05-16 DIAGNOSIS — M47816 Spondylosis without myelopathy or radiculopathy, lumbar region: Secondary | ICD-10-CM | POA: Diagnosis not present

## 2015-06-19 DIAGNOSIS — Z1231 Encounter for screening mammogram for malignant neoplasm of breast: Secondary | ICD-10-CM | POA: Diagnosis not present

## 2015-07-03 DIAGNOSIS — Z471 Aftercare following joint replacement surgery: Secondary | ICD-10-CM | POA: Diagnosis not present

## 2015-07-03 DIAGNOSIS — Z96642 Presence of left artificial hip joint: Secondary | ICD-10-CM | POA: Diagnosis not present

## 2015-07-03 DIAGNOSIS — M7062 Trochanteric bursitis, left hip: Secondary | ICD-10-CM | POA: Diagnosis not present

## 2015-07-03 DIAGNOSIS — M546 Pain in thoracic spine: Secondary | ICD-10-CM | POA: Diagnosis not present

## 2015-07-19 DIAGNOSIS — M5136 Other intervertebral disc degeneration, lumbar region: Secondary | ICD-10-CM | POA: Diagnosis not present

## 2015-07-19 DIAGNOSIS — M419 Scoliosis, unspecified: Secondary | ICD-10-CM | POA: Diagnosis not present

## 2015-07-19 DIAGNOSIS — M47816 Spondylosis without myelopathy or radiculopathy, lumbar region: Secondary | ICD-10-CM | POA: Diagnosis not present

## 2015-08-14 DIAGNOSIS — Z471 Aftercare following joint replacement surgery: Secondary | ICD-10-CM | POA: Diagnosis not present

## 2015-08-14 DIAGNOSIS — Z96642 Presence of left artificial hip joint: Secondary | ICD-10-CM | POA: Diagnosis not present

## 2015-08-14 DIAGNOSIS — M7062 Trochanteric bursitis, left hip: Secondary | ICD-10-CM | POA: Diagnosis not present

## 2015-08-17 DIAGNOSIS — M5442 Lumbago with sciatica, left side: Secondary | ICD-10-CM | POA: Diagnosis not present

## 2015-08-17 DIAGNOSIS — M5136 Other intervertebral disc degeneration, lumbar region: Secondary | ICD-10-CM | POA: Diagnosis not present

## 2015-10-07 ENCOUNTER — Encounter (HOSPITAL_BASED_OUTPATIENT_CLINIC_OR_DEPARTMENT_OTHER): Payer: Medicare Other | Attending: Internal Medicine

## 2015-10-07 DIAGNOSIS — L97421 Non-pressure chronic ulcer of left heel and midfoot limited to breakdown of skin: Secondary | ICD-10-CM | POA: Diagnosis not present

## 2015-10-07 DIAGNOSIS — I10 Essential (primary) hypertension: Secondary | ICD-10-CM | POA: Diagnosis not present

## 2015-10-07 DIAGNOSIS — M1990 Unspecified osteoarthritis, unspecified site: Secondary | ICD-10-CM | POA: Diagnosis not present

## 2015-10-07 DIAGNOSIS — L89322 Pressure ulcer of left buttock, stage 2: Secondary | ICD-10-CM | POA: Insufficient documentation

## 2015-10-07 DIAGNOSIS — I87312 Chronic venous hypertension (idiopathic) with ulcer of left lower extremity: Secondary | ICD-10-CM | POA: Diagnosis not present

## 2015-10-17 ENCOUNTER — Encounter (HOSPITAL_BASED_OUTPATIENT_CLINIC_OR_DEPARTMENT_OTHER): Payer: Medicare Other | Attending: Internal Medicine

## 2015-10-17 DIAGNOSIS — I87312 Chronic venous hypertension (idiopathic) with ulcer of left lower extremity: Secondary | ICD-10-CM | POA: Diagnosis not present

## 2015-10-17 DIAGNOSIS — I10 Essential (primary) hypertension: Secondary | ICD-10-CM | POA: Insufficient documentation

## 2015-10-17 DIAGNOSIS — L97421 Non-pressure chronic ulcer of left heel and midfoot limited to breakdown of skin: Secondary | ICD-10-CM | POA: Diagnosis not present

## 2015-10-17 DIAGNOSIS — M1990 Unspecified osteoarthritis, unspecified site: Secondary | ICD-10-CM | POA: Diagnosis not present

## 2015-10-17 DIAGNOSIS — L89322 Pressure ulcer of left buttock, stage 2: Secondary | ICD-10-CM | POA: Diagnosis not present

## 2015-10-23 DIAGNOSIS — E559 Vitamin D deficiency, unspecified: Secondary | ICD-10-CM | POA: Diagnosis not present

## 2015-10-23 DIAGNOSIS — M8580 Other specified disorders of bone density and structure, unspecified site: Secondary | ICD-10-CM | POA: Diagnosis not present

## 2015-10-23 DIAGNOSIS — I10 Essential (primary) hypertension: Secondary | ICD-10-CM | POA: Diagnosis not present

## 2015-10-23 DIAGNOSIS — R739 Hyperglycemia, unspecified: Secondary | ICD-10-CM | POA: Diagnosis not present

## 2015-10-30 DIAGNOSIS — R413 Other amnesia: Secondary | ICD-10-CM | POA: Diagnosis not present

## 2015-10-30 DIAGNOSIS — M5489 Other dorsalgia: Secondary | ICD-10-CM | POA: Diagnosis not present

## 2015-10-30 DIAGNOSIS — I10 Essential (primary) hypertension: Secondary | ICD-10-CM | POA: Diagnosis not present

## 2015-10-30 DIAGNOSIS — E7800 Pure hypercholesterolemia, unspecified: Secondary | ICD-10-CM | POA: Diagnosis not present

## 2015-10-31 DIAGNOSIS — L89312 Pressure ulcer of right buttock, stage 2: Secondary | ICD-10-CM | POA: Diagnosis not present

## 2015-10-31 DIAGNOSIS — I10 Essential (primary) hypertension: Secondary | ICD-10-CM | POA: Diagnosis not present

## 2015-10-31 DIAGNOSIS — M1990 Unspecified osteoarthritis, unspecified site: Secondary | ICD-10-CM | POA: Diagnosis not present

## 2015-10-31 DIAGNOSIS — L89322 Pressure ulcer of left buttock, stage 2: Secondary | ICD-10-CM | POA: Diagnosis not present

## 2015-11-20 DIAGNOSIS — I10 Essential (primary) hypertension: Secondary | ICD-10-CM | POA: Diagnosis not present

## 2015-11-20 DIAGNOSIS — E7800 Pure hypercholesterolemia, unspecified: Secondary | ICD-10-CM | POA: Diagnosis not present

## 2015-11-20 DIAGNOSIS — R413 Other amnesia: Secondary | ICD-10-CM | POA: Diagnosis not present

## 2015-11-21 DIAGNOSIS — H02102 Unspecified ectropion of right lower eyelid: Secondary | ICD-10-CM | POA: Diagnosis not present

## 2015-11-27 DIAGNOSIS — Z0000 Encounter for general adult medical examination without abnormal findings: Secondary | ICD-10-CM | POA: Diagnosis not present

## 2015-11-27 DIAGNOSIS — R413 Other amnesia: Secondary | ICD-10-CM | POA: Diagnosis not present

## 2015-11-27 DIAGNOSIS — E559 Vitamin D deficiency, unspecified: Secondary | ICD-10-CM | POA: Diagnosis not present

## 2015-11-27 DIAGNOSIS — Z23 Encounter for immunization: Secondary | ICD-10-CM | POA: Diagnosis not present

## 2015-11-27 DIAGNOSIS — R739 Hyperglycemia, unspecified: Secondary | ICD-10-CM | POA: Diagnosis not present

## 2015-11-27 DIAGNOSIS — I10 Essential (primary) hypertension: Secondary | ICD-10-CM | POA: Diagnosis not present

## 2015-12-05 DIAGNOSIS — I739 Peripheral vascular disease, unspecified: Secondary | ICD-10-CM | POA: Diagnosis not present

## 2016-01-01 ENCOUNTER — Ambulatory Visit (INDEPENDENT_AMBULATORY_CARE_PROVIDER_SITE_OTHER): Payer: Medicare Other | Admitting: Gastroenterology

## 2016-01-01 ENCOUNTER — Encounter: Payer: Self-pay | Admitting: Gastroenterology

## 2016-01-01 VITALS — BP 136/62 | HR 82 | Ht 63.0 in | Wt 154.0 lb

## 2016-01-01 DIAGNOSIS — R159 Full incontinence of feces: Secondary | ICD-10-CM | POA: Diagnosis not present

## 2016-01-01 DIAGNOSIS — R143 Flatulence: Secondary | ICD-10-CM

## 2016-01-01 DIAGNOSIS — R197 Diarrhea, unspecified: Secondary | ICD-10-CM

## 2016-01-01 DIAGNOSIS — IMO0001 Reserved for inherently not codable concepts without codable children: Secondary | ICD-10-CM

## 2016-01-01 DIAGNOSIS — Z86010 Personal history of colonic polyps: Secondary | ICD-10-CM

## 2016-01-01 NOTE — Patient Instructions (Signed)
If you decide to schedule the rectal manometry, then please call [phone].   We have given you a low gas diet.   Start over the counter Gas-X four times a day as needed.   Avoid foods that will cause diarrhea.   Thank you for choosing me and [facility].  Pricilla Riffle. Dagoberto Ligas., MD., [facility]

## 2016-01-01 NOTE — Progress Notes (Signed)
    History of Present Illness: This is an 82 year old female referred by Abigail Gravel, MD for the evaluation of diarrhea, fecal incontinence. She is accompanied by her daughter. She was evaluated by Dr Lajoyce Corners for incontinence in 2007 and underwent colonoscopy in 2007 showing 1 adenomatous polyp. She was treated by Dr. Paulita Fujita in 2013 for gallstone pancreatitis and underwent EUS by Dr. Paulita Fujita in 2013 which was unremarkable. The patient has memory impairment and does not recall having problems with fecal incontinence in 2007. She does relate intermittent episodes of diarrhea with fecal incontinence occurring about every few months over the past couple of years. She states happened 2 or 3 times in 2017. She states she has a difficult time holding flatus. She has taken over-the-counter gas remedy intermittently. Her husband passed away in 2015/07/31. Denies weight loss, abdominal pain, constipation, change in stool caliber, melena, hematochezia, nausea, vomiting, dysphagia, reflux symptoms, chest pain.  Review of Systems: Pertinent positive and negative review of systems were noted in the above HPI section. All other review of systems were otherwise negative.  Current Medications, Allergies, Past Medical History, Past Surgical History, Family History and Social History were reviewed in [employer] record.  Physical Exam: General: Well developed, well nourished, elderly, no acute distress Head: Normocephalic and atraumatic Eyes:  sclerae anicteric, EOMI Ears: Normal auditory acuity Mouth: No deformity or lesions Neck: Supple, no masses or thyromegaly Lungs: Clear throughout to auscultation Heart: Regular rate and rhythm; no murmurs, rubs or bruits Abdomen: Soft, non tender and non distended. No masses, hepatosplenomegaly or hernias noted. Normal Bowel sounds Rectal: decreased sphincter tone, decreased anal squeeze, no lesions, heme neg brown stool in vault.  Musculoskeletal:  Symmetrical with no gross deformities  Skin: No lesions on visible extremities Pulses:  Normal pulses noted Extremities: No clubbing, cyanosis, edema or deformities noted Neurological: Alert oriented x 4, memory deficits Cervical Nodes:  No significant cervical adenopathy Inguinal Nodes: No significant inguinal adenopathy Psychological:  Alert and cooperative. Normal mood and affect  Assessment and Recommendations:  1. Intermittent diarrhea and intermittent fecal incontinence for over 10 years. Symptoms may have worsened over the past few years however given her memory impairment it is difficult to know. We discussed further evaluation with anorectal manometry and possibly colonoscopy however the patient is unsure she wants to proceed with any further evaluation. She was more interested in scheduling anorectal manometry than colonoscopy. She will contact us if she decides to schedule anorectal manometry. Recommended Kegel exercises. She was advised to note any foods in the proceeding 24 hours prior to episodes of diarrhea and to avoid foods that potentially were causing diarrhea.   2. Intestinal gas, flatus. Low gas diet and Gas-X (or similar product) 3 times a day.  3. Personal history adenomatous colon polyps. Surveillance colonoscopy was not recommended given her age over 34.  50. History of gallstone pancreatitis. Status post laparoscopic cholecystectomy.   cc: Abigail Gravel, MD [address] [place], [place] [id number]

## 2016-01-08 DIAGNOSIS — I739 Peripheral vascular disease, unspecified: Secondary | ICD-10-CM | POA: Diagnosis not present

## 2016-01-08 DIAGNOSIS — R0989 Other specified symptoms and signs involving the circulatory and respiratory systems: Secondary | ICD-10-CM | POA: Diagnosis not present

## 2016-01-08 DIAGNOSIS — I10 Essential (primary) hypertension: Secondary | ICD-10-CM | POA: Diagnosis not present

## 2016-01-24 DIAGNOSIS — M545 Low back pain: Secondary | ICD-10-CM | POA: Diagnosis not present

## 2016-01-24 DIAGNOSIS — M5117 Intervertebral disc disorders with radiculopathy, lumbosacral region: Secondary | ICD-10-CM | POA: Diagnosis not present

## 2016-01-24 DIAGNOSIS — M5136 Other intervertebral disc degeneration, lumbar region: Secondary | ICD-10-CM | POA: Diagnosis not present

## 2016-01-29 DIAGNOSIS — Z961 Presence of intraocular lens: Secondary | ICD-10-CM | POA: Diagnosis not present

## 2016-02-18 DIAGNOSIS — M5136 Other intervertebral disc degeneration, lumbar region: Secondary | ICD-10-CM | POA: Diagnosis not present

## 2016-02-18 DIAGNOSIS — M5442 Lumbago with sciatica, left side: Secondary | ICD-10-CM | POA: Diagnosis not present

## 2016-02-19 DIAGNOSIS — R0989 Other specified symptoms and signs involving the circulatory and respiratory systems: Secondary | ICD-10-CM | POA: Diagnosis not present

## 2016-02-20 DIAGNOSIS — R739 Hyperglycemia, unspecified: Secondary | ICD-10-CM | POA: Diagnosis not present

## 2016-02-20 DIAGNOSIS — E559 Vitamin D deficiency, unspecified: Secondary | ICD-10-CM | POA: Diagnosis not present

## 2016-02-20 DIAGNOSIS — M810 Age-related osteoporosis without current pathological fracture: Secondary | ICD-10-CM | POA: Diagnosis not present

## 2016-02-20 DIAGNOSIS — I10 Essential (primary) hypertension: Secondary | ICD-10-CM | POA: Diagnosis not present

## 2016-02-26 DIAGNOSIS — I10 Essential (primary) hypertension: Secondary | ICD-10-CM | POA: Diagnosis not present

## 2016-02-26 DIAGNOSIS — Z23 Encounter for immunization: Secondary | ICD-10-CM | POA: Diagnosis not present

## 2016-02-26 DIAGNOSIS — E7800 Pure hypercholesterolemia, unspecified: Secondary | ICD-10-CM | POA: Diagnosis not present

## 2016-02-26 DIAGNOSIS — R413 Other amnesia: Secondary | ICD-10-CM | POA: Diagnosis not present

## 2016-02-26 DIAGNOSIS — N390 Urinary tract infection, site not specified: Secondary | ICD-10-CM | POA: Diagnosis not present

## 2016-06-24 DIAGNOSIS — Z803 Family history of malignant neoplasm of breast: Secondary | ICD-10-CM | POA: Diagnosis not present

## 2016-06-24 DIAGNOSIS — Z1231 Encounter for screening mammogram for malignant neoplasm of breast: Secondary | ICD-10-CM | POA: Diagnosis not present

## 2016-06-26 DIAGNOSIS — M545 Low back pain: Secondary | ICD-10-CM | POA: Diagnosis not present

## 2016-06-26 DIAGNOSIS — M5416 Radiculopathy, lumbar region: Secondary | ICD-10-CM | POA: Diagnosis not present

## 2016-06-26 DIAGNOSIS — M4722 Other spondylosis with radiculopathy, cervical region: Secondary | ICD-10-CM | POA: Diagnosis not present

## 2016-06-26 DIAGNOSIS — M5412 Radiculopathy, cervical region: Secondary | ICD-10-CM | POA: Diagnosis not present

## 2016-06-26 DIAGNOSIS — M542 Cervicalgia: Secondary | ICD-10-CM | POA: Diagnosis not present

## 2016-08-13 DIAGNOSIS — E559 Vitamin D deficiency, unspecified: Secondary | ICD-10-CM | POA: Diagnosis not present

## 2016-08-13 DIAGNOSIS — E7800 Pure hypercholesterolemia, unspecified: Secondary | ICD-10-CM | POA: Diagnosis not present

## 2016-08-13 DIAGNOSIS — M859 Disorder of bone density and structure, unspecified: Secondary | ICD-10-CM | POA: Diagnosis not present

## 2016-08-13 DIAGNOSIS — I10 Essential (primary) hypertension: Secondary | ICD-10-CM | POA: Diagnosis not present

## 2016-08-19 DIAGNOSIS — I10 Essential (primary) hypertension: Secondary | ICD-10-CM | POA: Diagnosis not present

## 2016-08-19 DIAGNOSIS — Z23 Encounter for immunization: Secondary | ICD-10-CM | POA: Diagnosis not present

## 2016-08-19 DIAGNOSIS — M791 Myalgia: Secondary | ICD-10-CM | POA: Diagnosis not present

## 2016-08-19 DIAGNOSIS — N390 Urinary tract infection, site not specified: Secondary | ICD-10-CM | POA: Diagnosis not present

## 2016-08-19 DIAGNOSIS — Z0000 Encounter for general adult medical examination without abnormal findings: Secondary | ICD-10-CM | POA: Diagnosis not present

## 2016-08-19 DIAGNOSIS — E559 Vitamin D deficiency, unspecified: Secondary | ICD-10-CM | POA: Diagnosis not present

## 2016-09-17 DIAGNOSIS — E7800 Pure hypercholesterolemia, unspecified: Secondary | ICD-10-CM | POA: Diagnosis not present

## 2016-09-17 DIAGNOSIS — M545 Low back pain: Secondary | ICD-10-CM | POA: Diagnosis not present

## 2016-09-17 DIAGNOSIS — R001 Bradycardia, unspecified: Secondary | ICD-10-CM | POA: Diagnosis not present

## 2016-09-17 DIAGNOSIS — G8929 Other chronic pain: Secondary | ICD-10-CM | POA: Diagnosis not present

## 2016-09-17 DIAGNOSIS — R55 Syncope and collapse: Secondary | ICD-10-CM | POA: Diagnosis not present

## 2016-09-17 DIAGNOSIS — R490 Dysphonia: Secondary | ICD-10-CM | POA: Diagnosis not present

## 2016-09-17 DIAGNOSIS — S3992XA Unspecified injury of lower back, initial encounter: Secondary | ICD-10-CM | POA: Diagnosis not present

## 2016-09-24 DIAGNOSIS — R55 Syncope and collapse: Secondary | ICD-10-CM | POA: Diagnosis not present

## 2016-09-24 DIAGNOSIS — I10 Essential (primary) hypertension: Secondary | ICD-10-CM | POA: Diagnosis not present

## 2016-09-24 DIAGNOSIS — R001 Bradycardia, unspecified: Secondary | ICD-10-CM | POA: Diagnosis not present

## 2016-09-24 DIAGNOSIS — R490 Dysphonia: Secondary | ICD-10-CM | POA: Diagnosis not present

## 2016-10-05 DIAGNOSIS — I10 Essential (primary) hypertension: Secondary | ICD-10-CM | POA: Diagnosis not present

## 2016-10-05 DIAGNOSIS — R55 Syncope and collapse: Secondary | ICD-10-CM | POA: Diagnosis not present

## 2016-10-05 DIAGNOSIS — I493 Ventricular premature depolarization: Secondary | ICD-10-CM | POA: Diagnosis not present

## 2016-10-05 DIAGNOSIS — R001 Bradycardia, unspecified: Secondary | ICD-10-CM | POA: Diagnosis not present

## 2016-10-16 DIAGNOSIS — M5416 Radiculopathy, lumbar region: Secondary | ICD-10-CM | POA: Diagnosis not present

## 2016-10-16 DIAGNOSIS — M4722 Other spondylosis with radiculopathy, cervical region: Secondary | ICD-10-CM | POA: Diagnosis not present

## 2016-10-16 DIAGNOSIS — M4726 Other spondylosis with radiculopathy, lumbar region: Secondary | ICD-10-CM | POA: Diagnosis not present

## 2016-10-16 DIAGNOSIS — M5412 Radiculopathy, cervical region: Secondary | ICD-10-CM | POA: Diagnosis not present

## 2016-10-16 DIAGNOSIS — M542 Cervicalgia: Secondary | ICD-10-CM | POA: Diagnosis not present

## 2016-10-16 DIAGNOSIS — M545 Low back pain: Secondary | ICD-10-CM | POA: Diagnosis not present

## 2016-10-19 DIAGNOSIS — R55 Syncope and collapse: Secondary | ICD-10-CM | POA: Diagnosis not present

## 2016-10-19 DIAGNOSIS — I493 Ventricular premature depolarization: Secondary | ICD-10-CM | POA: Diagnosis not present

## 2016-10-21 DIAGNOSIS — R55 Syncope and collapse: Secondary | ICD-10-CM | POA: Diagnosis not present

## 2016-10-27 DIAGNOSIS — I493 Ventricular premature depolarization: Secondary | ICD-10-CM | POA: Diagnosis not present

## 2016-10-27 DIAGNOSIS — I10 Essential (primary) hypertension: Secondary | ICD-10-CM | POA: Diagnosis not present

## 2016-10-27 DIAGNOSIS — R55 Syncope and collapse: Secondary | ICD-10-CM | POA: Diagnosis not present

## 2016-10-27 DIAGNOSIS — R001 Bradycardia, unspecified: Secondary | ICD-10-CM | POA: Diagnosis not present

## 2016-11-05 DIAGNOSIS — M25562 Pain in left knee: Secondary | ICD-10-CM | POA: Diagnosis not present

## 2016-11-05 DIAGNOSIS — M5442 Lumbago with sciatica, left side: Secondary | ICD-10-CM | POA: Diagnosis not present

## 2016-11-05 DIAGNOSIS — M5441 Lumbago with sciatica, right side: Secondary | ICD-10-CM | POA: Diagnosis not present

## 2016-11-05 DIAGNOSIS — G8929 Other chronic pain: Secondary | ICD-10-CM | POA: Diagnosis not present

## 2016-11-05 DIAGNOSIS — M7062 Trochanteric bursitis, left hip: Secondary | ICD-10-CM | POA: Diagnosis not present

## 2016-11-05 DIAGNOSIS — M7061 Trochanteric bursitis, right hip: Secondary | ICD-10-CM | POA: Diagnosis not present

## 2016-11-05 DIAGNOSIS — M25561 Pain in right knee: Secondary | ICD-10-CM | POA: Diagnosis not present

## 2016-11-16 DIAGNOSIS — R001 Bradycardia, unspecified: Secondary | ICD-10-CM | POA: Diagnosis not present

## 2016-11-16 DIAGNOSIS — I493 Ventricular premature depolarization: Secondary | ICD-10-CM | POA: Diagnosis not present

## 2016-11-16 DIAGNOSIS — R55 Syncope and collapse: Secondary | ICD-10-CM | POA: Diagnosis not present

## 2016-11-16 DIAGNOSIS — I951 Orthostatic hypotension: Secondary | ICD-10-CM | POA: Diagnosis not present

## 2016-12-30 DIAGNOSIS — R011 Cardiac murmur, unspecified: Secondary | ICD-10-CM | POA: Diagnosis not present

## 2016-12-30 DIAGNOSIS — I10 Essential (primary) hypertension: Secondary | ICD-10-CM | POA: Diagnosis not present

## 2016-12-30 DIAGNOSIS — I951 Orthostatic hypotension: Secondary | ICD-10-CM | POA: Diagnosis not present

## 2016-12-30 DIAGNOSIS — I493 Ventricular premature depolarization: Secondary | ICD-10-CM | POA: Diagnosis not present

## 2017-01-20 DIAGNOSIS — I10 Essential (primary) hypertension: Secondary | ICD-10-CM | POA: Diagnosis not present

## 2017-01-20 DIAGNOSIS — E7800 Pure hypercholesterolemia, unspecified: Secondary | ICD-10-CM | POA: Diagnosis not present

## 2017-01-26 DIAGNOSIS — R413 Other amnesia: Secondary | ICD-10-CM | POA: Diagnosis not present

## 2017-01-26 DIAGNOSIS — I10 Essential (primary) hypertension: Secondary | ICD-10-CM | POA: Diagnosis not present

## 2017-01-26 DIAGNOSIS — M545 Low back pain: Secondary | ICD-10-CM | POA: Diagnosis not present

## 2017-01-26 DIAGNOSIS — R739 Hyperglycemia, unspecified: Secondary | ICD-10-CM | POA: Diagnosis not present

## 2017-02-09 DIAGNOSIS — M5441 Lumbago with sciatica, right side: Secondary | ICD-10-CM | POA: Diagnosis not present

## 2017-02-09 DIAGNOSIS — M5442 Lumbago with sciatica, left side: Secondary | ICD-10-CM | POA: Diagnosis not present

## 2017-02-09 DIAGNOSIS — G8929 Other chronic pain: Secondary | ICD-10-CM | POA: Diagnosis not present

## 2017-02-09 DIAGNOSIS — Z23 Encounter for immunization: Secondary | ICD-10-CM | POA: Diagnosis not present

## 2017-02-09 DIAGNOSIS — Z5181 Encounter for therapeutic drug level monitoring: Secondary | ICD-10-CM | POA: Diagnosis not present

## 2017-02-16 DIAGNOSIS — R300 Dysuria: Secondary | ICD-10-CM | POA: Diagnosis not present

## 2017-02-16 DIAGNOSIS — N390 Urinary tract infection, site not specified: Secondary | ICD-10-CM | POA: Diagnosis not present

## 2017-03-01 DIAGNOSIS — N390 Urinary tract infection, site not specified: Secondary | ICD-10-CM | POA: Diagnosis not present

## 2017-03-01 DIAGNOSIS — Z5181 Encounter for therapeutic drug level monitoring: Secondary | ICD-10-CM | POA: Diagnosis not present

## 2017-03-15 DIAGNOSIS — M47816 Spondylosis without myelopathy or radiculopathy, lumbar region: Secondary | ICD-10-CM | POA: Diagnosis not present

## 2017-03-15 DIAGNOSIS — M5090 Cervical disc disorder, unspecified, unspecified cervical region: Secondary | ICD-10-CM | POA: Diagnosis not present

## 2017-03-15 DIAGNOSIS — M25512 Pain in left shoulder: Secondary | ICD-10-CM | POA: Diagnosis not present

## 2017-04-05 DIAGNOSIS — R300 Dysuria: Secondary | ICD-10-CM | POA: Diagnosis not present

## 2017-04-15 DIAGNOSIS — N390 Urinary tract infection, site not specified: Secondary | ICD-10-CM | POA: Diagnosis not present

## 2017-04-16 DIAGNOSIS — M47816 Spondylosis without myelopathy or radiculopathy, lumbar region: Secondary | ICD-10-CM | POA: Diagnosis not present

## 2017-04-16 DIAGNOSIS — M5136 Other intervertebral disc degeneration, lumbar region: Secondary | ICD-10-CM | POA: Diagnosis not present

## 2017-04-29 DIAGNOSIS — M5136 Other intervertebral disc degeneration, lumbar region: Secondary | ICD-10-CM | POA: Diagnosis not present

## 2017-04-29 DIAGNOSIS — M5030 Other cervical disc degeneration, unspecified cervical region: Secondary | ICD-10-CM | POA: Diagnosis not present

## 2017-04-29 DIAGNOSIS — M47816 Spondylosis without myelopathy or radiculopathy, lumbar region: Secondary | ICD-10-CM | POA: Diagnosis not present

## 2017-05-05 DIAGNOSIS — N3001 Acute cystitis with hematuria: Secondary | ICD-10-CM | POA: Diagnosis not present

## 2017-05-05 DIAGNOSIS — R413 Other amnesia: Secondary | ICD-10-CM | POA: Diagnosis not present

## 2017-05-05 DIAGNOSIS — I10 Essential (primary) hypertension: Secondary | ICD-10-CM | POA: Diagnosis not present

## 2017-05-13 DIAGNOSIS — N3001 Acute cystitis with hematuria: Secondary | ICD-10-CM | POA: Diagnosis not present

## 2017-05-21 ENCOUNTER — Ambulatory Visit: Payer: Self-pay | Admitting: Internal Medicine

## 2017-05-21 DIAGNOSIS — N390 Urinary tract infection, site not specified: Secondary | ICD-10-CM | POA: Diagnosis not present

## 2017-05-26 DIAGNOSIS — M5441 Lumbago with sciatica, right side: Secondary | ICD-10-CM | POA: Diagnosis not present

## 2017-05-26 DIAGNOSIS — M5090 Cervical disc disorder, unspecified, unspecified cervical region: Secondary | ICD-10-CM | POA: Diagnosis not present

## 2017-05-26 DIAGNOSIS — M5136 Other intervertebral disc degeneration, lumbar region: Secondary | ICD-10-CM | POA: Diagnosis not present

## 2017-05-26 DIAGNOSIS — M419 Scoliosis, unspecified: Secondary | ICD-10-CM | POA: Diagnosis not present

## 2017-05-26 DIAGNOSIS — M47816 Spondylosis without myelopathy or radiculopathy, lumbar region: Secondary | ICD-10-CM | POA: Diagnosis not present

## 2017-05-26 DIAGNOSIS — M5442 Lumbago with sciatica, left side: Secondary | ICD-10-CM | POA: Diagnosis not present

## 2017-05-28 DIAGNOSIS — M5441 Lumbago with sciatica, right side: Secondary | ICD-10-CM | POA: Diagnosis not present

## 2017-05-28 DIAGNOSIS — M47816 Spondylosis without myelopathy or radiculopathy, lumbar region: Secondary | ICD-10-CM | POA: Diagnosis not present

## 2017-05-28 DIAGNOSIS — M5442 Lumbago with sciatica, left side: Secondary | ICD-10-CM | POA: Diagnosis not present

## 2017-05-28 DIAGNOSIS — M5136 Other intervertebral disc degeneration, lumbar region: Secondary | ICD-10-CM | POA: Diagnosis not present

## 2017-05-28 DIAGNOSIS — M419 Scoliosis, unspecified: Secondary | ICD-10-CM | POA: Diagnosis not present

## 2017-05-28 DIAGNOSIS — M5090 Cervical disc disorder, unspecified, unspecified cervical region: Secondary | ICD-10-CM | POA: Diagnosis not present

## 2017-05-31 DIAGNOSIS — M419 Scoliosis, unspecified: Secondary | ICD-10-CM | POA: Diagnosis not present

## 2017-05-31 DIAGNOSIS — M5441 Lumbago with sciatica, right side: Secondary | ICD-10-CM | POA: Diagnosis not present

## 2017-05-31 DIAGNOSIS — M5090 Cervical disc disorder, unspecified, unspecified cervical region: Secondary | ICD-10-CM | POA: Diagnosis not present

## 2017-05-31 DIAGNOSIS — M5136 Other intervertebral disc degeneration, lumbar region: Secondary | ICD-10-CM | POA: Diagnosis not present

## 2017-05-31 DIAGNOSIS — M5442 Lumbago with sciatica, left side: Secondary | ICD-10-CM | POA: Diagnosis not present

## 2017-05-31 DIAGNOSIS — M47816 Spondylosis without myelopathy or radiculopathy, lumbar region: Secondary | ICD-10-CM | POA: Diagnosis not present

## 2017-06-03 DIAGNOSIS — M47816 Spondylosis without myelopathy or radiculopathy, lumbar region: Secondary | ICD-10-CM | POA: Diagnosis not present

## 2017-06-03 DIAGNOSIS — M5136 Other intervertebral disc degeneration, lumbar region: Secondary | ICD-10-CM | POA: Diagnosis not present

## 2017-06-03 DIAGNOSIS — M5090 Cervical disc disorder, unspecified, unspecified cervical region: Secondary | ICD-10-CM | POA: Diagnosis not present

## 2017-06-03 DIAGNOSIS — M5441 Lumbago with sciatica, right side: Secondary | ICD-10-CM | POA: Diagnosis not present

## 2017-06-03 DIAGNOSIS — M419 Scoliosis, unspecified: Secondary | ICD-10-CM | POA: Diagnosis not present

## 2017-06-03 DIAGNOSIS — M5442 Lumbago with sciatica, left side: Secondary | ICD-10-CM | POA: Diagnosis not present

## 2017-06-08 DIAGNOSIS — M5442 Lumbago with sciatica, left side: Secondary | ICD-10-CM | POA: Diagnosis not present

## 2017-06-08 DIAGNOSIS — M5136 Other intervertebral disc degeneration, lumbar region: Secondary | ICD-10-CM | POA: Diagnosis not present

## 2017-06-08 DIAGNOSIS — M5090 Cervical disc disorder, unspecified, unspecified cervical region: Secondary | ICD-10-CM | POA: Diagnosis not present

## 2017-06-08 DIAGNOSIS — M5441 Lumbago with sciatica, right side: Secondary | ICD-10-CM | POA: Diagnosis not present

## 2017-06-08 DIAGNOSIS — M47816 Spondylosis without myelopathy or radiculopathy, lumbar region: Secondary | ICD-10-CM | POA: Diagnosis not present

## 2017-06-08 DIAGNOSIS — M419 Scoliosis, unspecified: Secondary | ICD-10-CM | POA: Diagnosis not present

## 2017-06-10 DIAGNOSIS — M5442 Lumbago with sciatica, left side: Secondary | ICD-10-CM | POA: Diagnosis not present

## 2017-06-10 DIAGNOSIS — M419 Scoliosis, unspecified: Secondary | ICD-10-CM | POA: Diagnosis not present

## 2017-06-10 DIAGNOSIS — M47816 Spondylosis without myelopathy or radiculopathy, lumbar region: Secondary | ICD-10-CM | POA: Diagnosis not present

## 2017-06-10 DIAGNOSIS — M5090 Cervical disc disorder, unspecified, unspecified cervical region: Secondary | ICD-10-CM | POA: Diagnosis not present

## 2017-06-10 DIAGNOSIS — M5136 Other intervertebral disc degeneration, lumbar region: Secondary | ICD-10-CM | POA: Diagnosis not present

## 2017-06-10 DIAGNOSIS — M5441 Lumbago with sciatica, right side: Secondary | ICD-10-CM | POA: Diagnosis not present

## 2017-06-11 ENCOUNTER — Emergency Department (HOSPITAL_BASED_OUTPATIENT_CLINIC_OR_DEPARTMENT_OTHER): Payer: Medicare Other

## 2017-06-11 ENCOUNTER — Encounter (HOSPITAL_BASED_OUTPATIENT_CLINIC_OR_DEPARTMENT_OTHER): Payer: Self-pay | Admitting: Adult Health

## 2017-06-11 ENCOUNTER — Emergency Department (HOSPITAL_BASED_OUTPATIENT_CLINIC_OR_DEPARTMENT_OTHER)
Admission: EM | Admit: 2017-06-11 | Discharge: 2017-06-11 | Disposition: A | Discharge status: Home / Self Care | Payer: Medicare Other | Attending: Emergency Medicine | Admitting: Emergency Medicine

## 2017-06-11 ENCOUNTER — Other Ambulatory Visit: Payer: Self-pay

## 2017-06-11 DIAGNOSIS — I10 Essential (primary) hypertension: Secondary | ICD-10-CM | POA: Insufficient documentation

## 2017-06-11 DIAGNOSIS — S0093XA Contusion of unspecified part of head, initial encounter: Secondary | ICD-10-CM | POA: Diagnosis not present

## 2017-06-11 DIAGNOSIS — Z96642 Presence of left artificial hip joint: Secondary | ICD-10-CM | POA: Insufficient documentation

## 2017-06-11 DIAGNOSIS — Y998 Other external cause status: Secondary | ICD-10-CM | POA: Diagnosis not present

## 2017-06-11 DIAGNOSIS — Z79899 Other long term (current) drug therapy: Secondary | ICD-10-CM | POA: Diagnosis not present

## 2017-06-11 DIAGNOSIS — W010XXA Fall on same level from slipping, tripping and stumbling without subsequent striking against object, initial encounter: Secondary | ICD-10-CM | POA: Diagnosis not present

## 2017-06-11 DIAGNOSIS — Y929 Unspecified place or not applicable: Secondary | ICD-10-CM | POA: Insufficient documentation

## 2017-06-11 DIAGNOSIS — M545 Low back pain: Secondary | ICD-10-CM | POA: Diagnosis not present

## 2017-06-11 DIAGNOSIS — S161XXA Strain of muscle, fascia and tendon at neck level, initial encounter: Secondary | ICD-10-CM | POA: Diagnosis not present

## 2017-06-11 DIAGNOSIS — Z7982 Long term (current) use of aspirin: Secondary | ICD-10-CM | POA: Insufficient documentation

## 2017-06-11 DIAGNOSIS — Z96611 Presence of right artificial shoulder joint: Secondary | ICD-10-CM | POA: Insufficient documentation

## 2017-06-11 DIAGNOSIS — Z96651 Presence of right artificial knee joint: Secondary | ICD-10-CM | POA: Diagnosis not present

## 2017-06-11 DIAGNOSIS — I2510 Atherosclerotic heart disease of native coronary artery without angina pectoris: Secondary | ICD-10-CM | POA: Diagnosis not present

## 2017-06-11 DIAGNOSIS — M542 Cervicalgia: Secondary | ICD-10-CM | POA: Diagnosis not present

## 2017-06-11 DIAGNOSIS — W19XXXA Unspecified fall, initial encounter: Secondary | ICD-10-CM

## 2017-06-11 DIAGNOSIS — T148XXA Other injury of unspecified body region, initial encounter: Secondary | ICD-10-CM

## 2017-06-11 DIAGNOSIS — Y9301 Activity, walking, marching and hiking: Secondary | ICD-10-CM | POA: Diagnosis not present

## 2017-06-11 DIAGNOSIS — Z7902 Long term (current) use of antithrombotics/antiplatelets: Secondary | ICD-10-CM | POA: Insufficient documentation

## 2017-06-11 DIAGNOSIS — S0990XA Unspecified injury of head, initial encounter: Secondary | ICD-10-CM | POA: Diagnosis not present

## 2017-06-11 DIAGNOSIS — S199XXA Unspecified injury of neck, initial encounter: Secondary | ICD-10-CM | POA: Diagnosis not present

## 2017-06-11 IMAGING — CT CT CERVICAL SPINE W/O CM
3 of 7 series · 11 of 33 positions shown, 12 images · non-contrast
Comparison: MRI brain [DATE]

CLINICAL DATA: Fall today. Posterior head and neck pain. No loss of
consciousness.

EXAM:
CT HEAD WITHOUT CONTRAST
CT CERVICAL SPINE WITHOUT CONTRAST
TECHNIQUE: Multidetector CT imaging of the head and cervical spine was
performed following the standard protocol without intravenous
contrast. Multiplanar CT image reconstructions of the cervical spine
were also generated.

[Series 9: coronals · coronal · 0.21mm/px · 3 of 61 slices shown]
[im 16/61  bone]
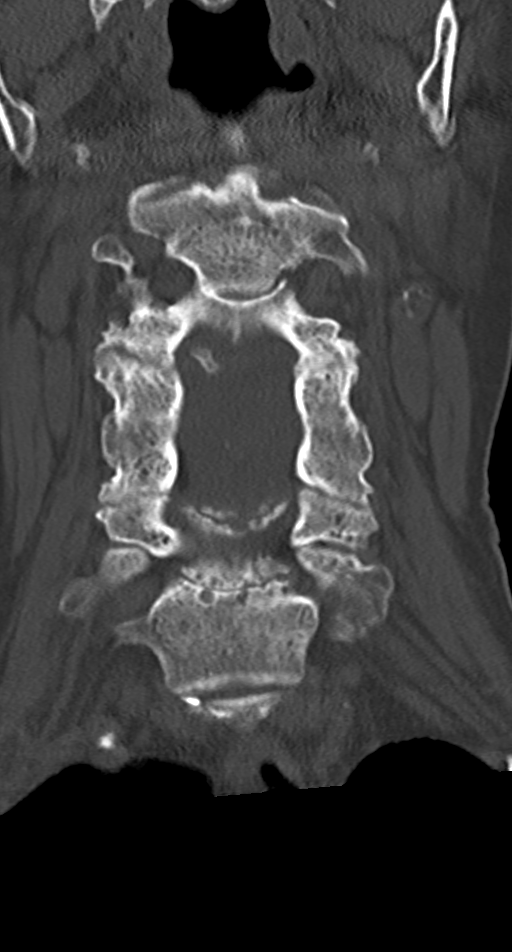
[im 31/61  bone]
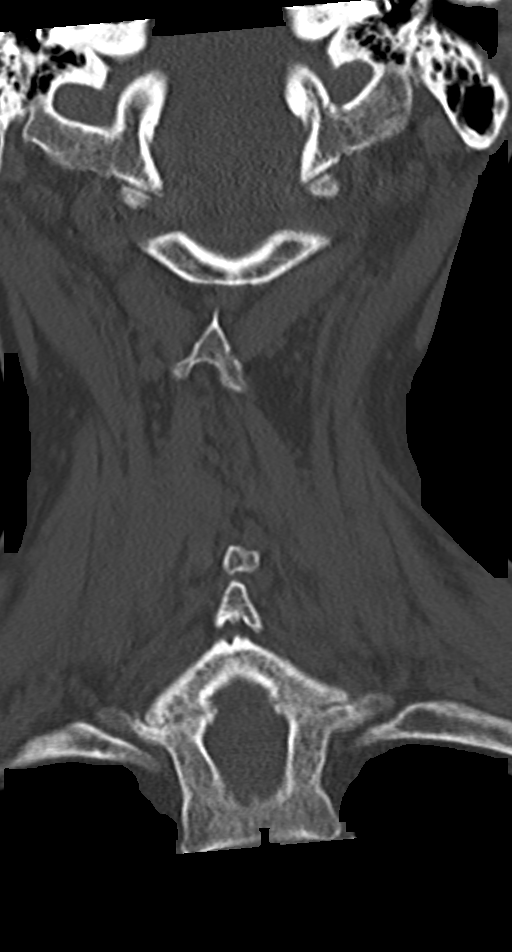
[im 46/61  bone]
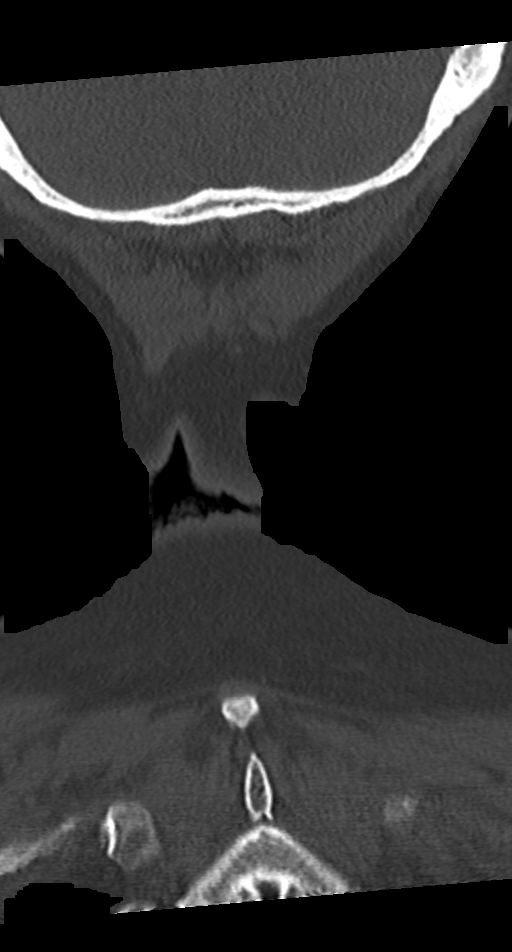

[Series 10: sagittals · sagittal · 0.23mm/px · 5 of 54 slices shown]
[im 9/54  bone]
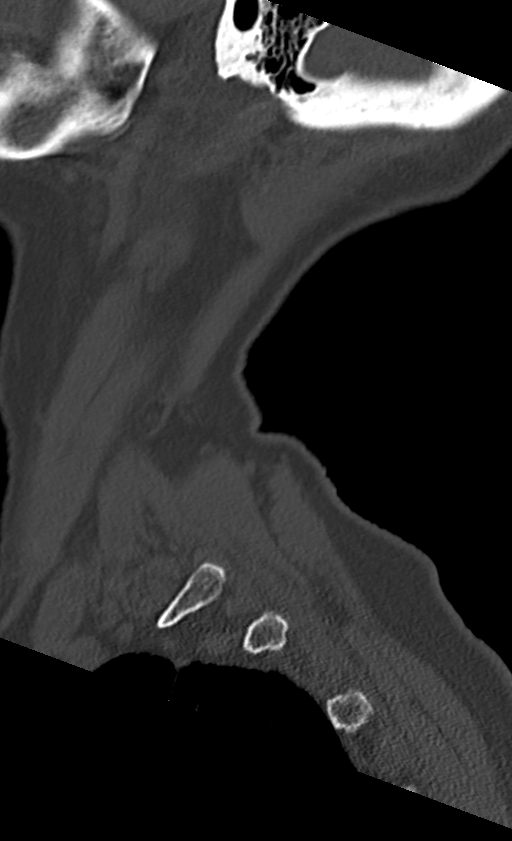
[im 18/54  bone]
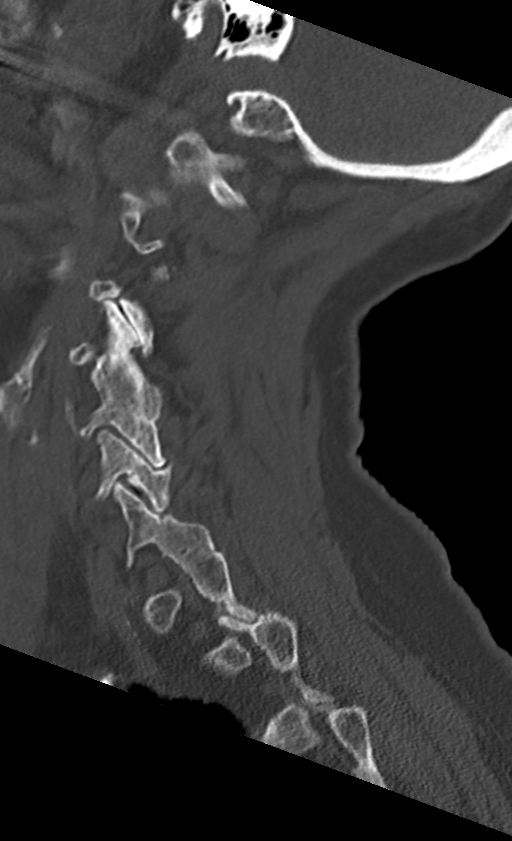
[im 27/54  bone]
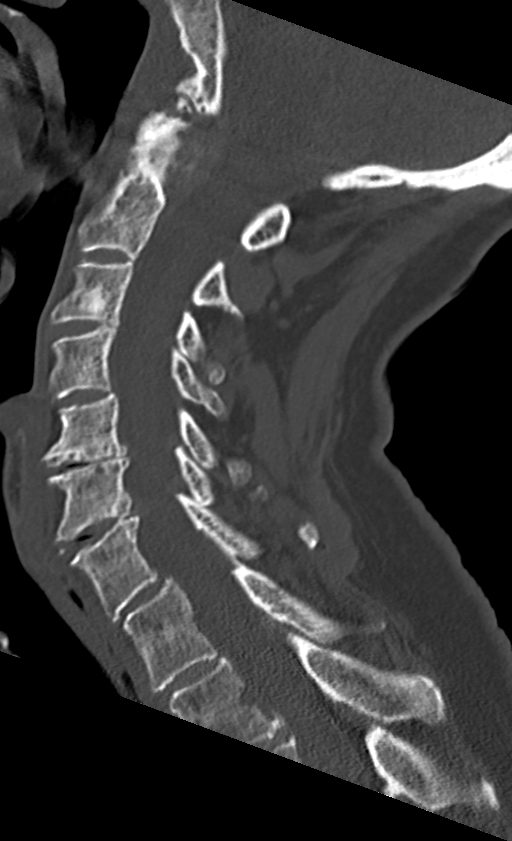
[im 36/54  bone]
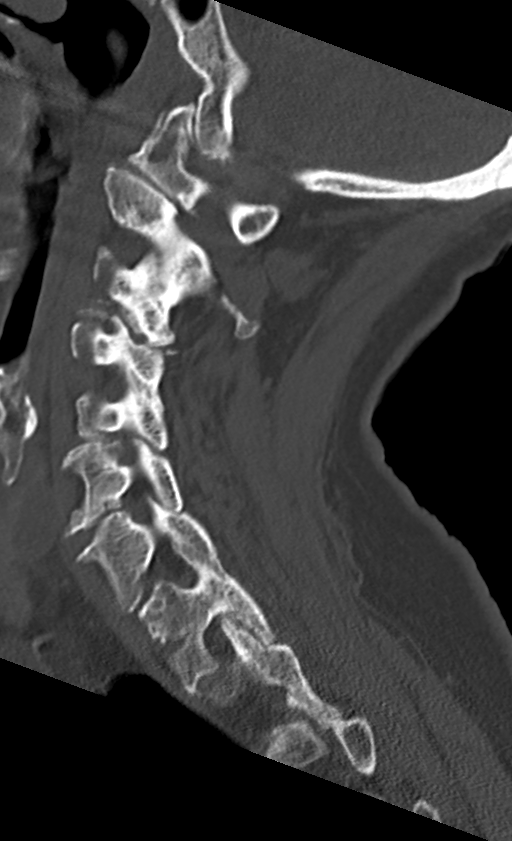
[im 45/54  bone]
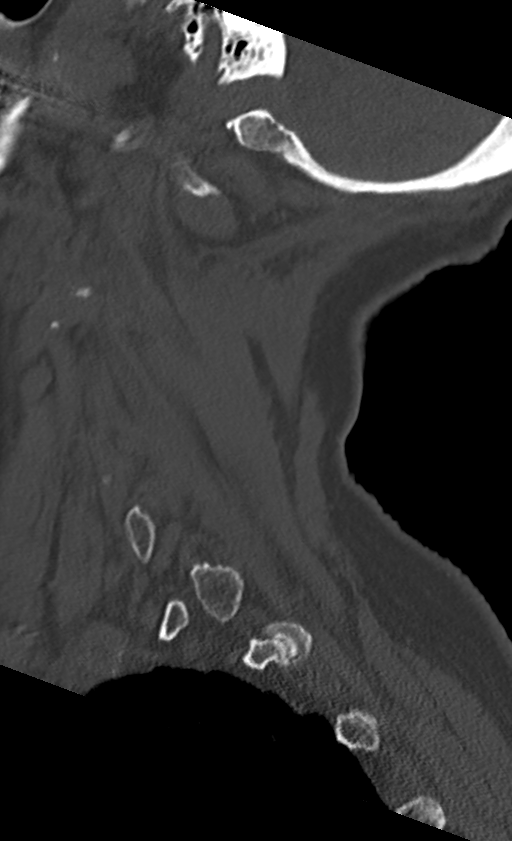

[Series 11: orthogonals · axial · 0.21mm/px · z∈[-325,-204]mm · 3 of 99 slices shown, 4 images]
[im 17/99  soft-tissue]
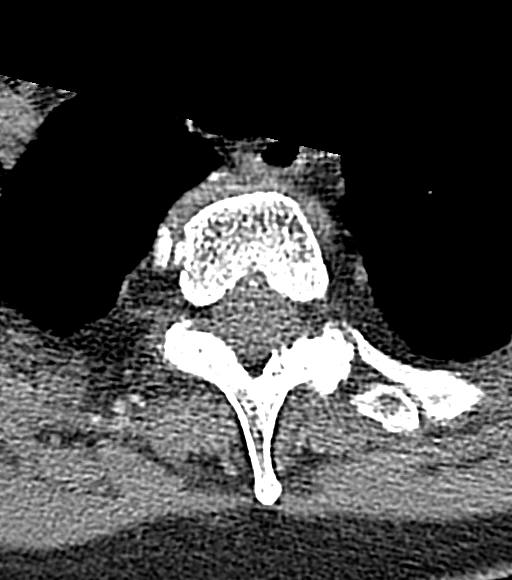
[im 17/99  bone]
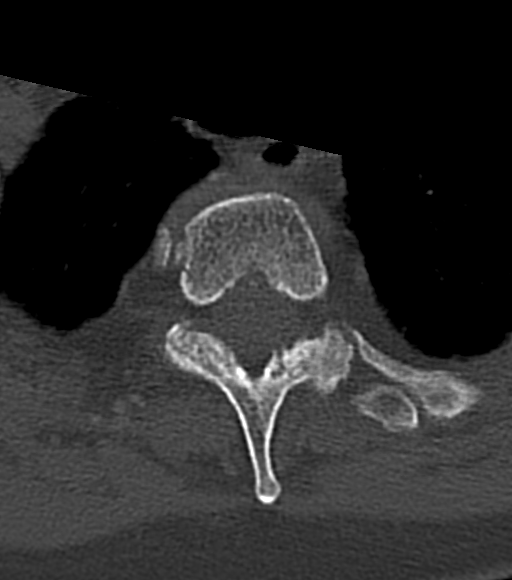
[im 50/99  bone]
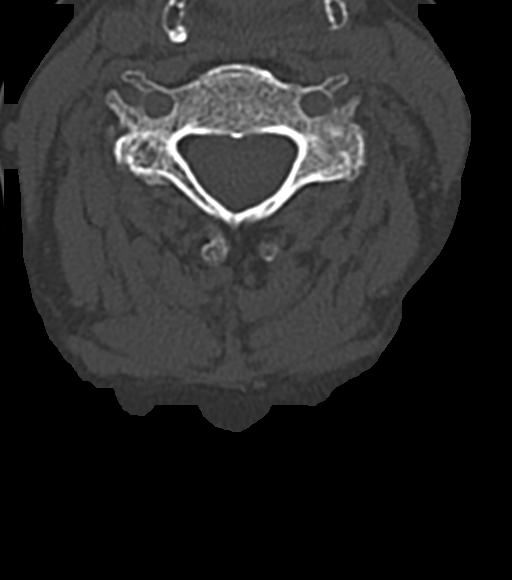
[im 82/99  bone]
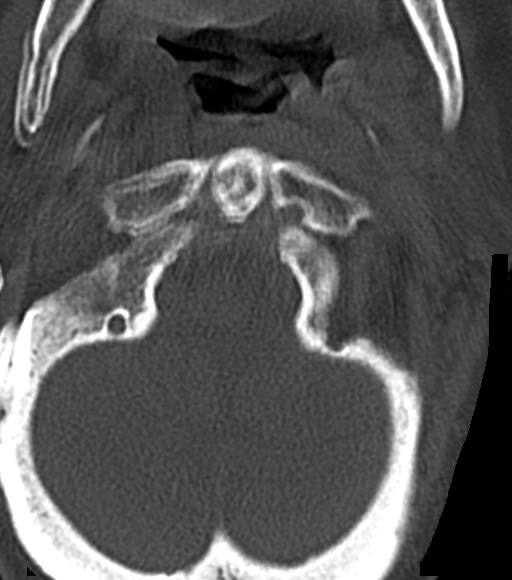

[11 of 33 positions shown; findings below may reference images not displayed]

FINDINGS: CT HEAD FINDINGS

Brain: There is no evidence of acute intracranial hemorrhage, mass
lesion, brain edema or extra-axial fluid collection. Mild
generalized prominence of the ventricles and subarachnoid spaces
consistent with mild atrophy. There is no CT evidence of acute
cortical infarction. There are chronic small vessel ischemic changes
in the periventricular and subcortical white matter.

Vascular: Intracranial vascular calcifications. No hyperdense vessel
identified.

Skull: Negative for fracture or focal lesion.

Sinuses/Orbits: Hypoplastic left maxillary sinus, possibly due to
prior injury or chronic sinusitis. Minimal dependent opacity in the
right division of the sphenoid sinus. No air-fluid levels. The
mastoid air cells and middle ears are clear. No acute orbital
findings.

Other: Focal soft tissue swelling in the right parietal scalp. TMJ
degenerative changes bilaterally.

CT CERVICAL SPINE FINDINGS

Alignment: Normal.

Skull base and vertebrae: No evidence of acute cervical spine
fracture or traumatic subluxation. There is multilevel spondylosis
with disc space loss and uncinate spurring. The facet joints are
fused on the right at C2-3, C4-5 and C7-T1. On the left, the facet
joints are fused at C4-5 and C7-T1.

Soft tissues and spinal canal: No prevertebral fluid or swelling. No
visible canal hematoma.

Disc levels: No evidence of acute soft disc herniation. Uncinate
spurring and facet hypertrophy contribute to foraminal narrowing,
especially on the right at C3-4 and bilaterally at C5-6.

Upper chest: Bilateral carotid atherosclerosis.

Other: None.
IMPRESSION: 1. No acute intracranial or calvarial findings.
2. Soft tissue swelling at the right parietal vertex.
3. No evidence of acute cervical spine fracture, traumatic
subluxation or static signs of instability.
4. Multilevel cervical spondylosis as described.

## 2017-06-11 NOTE — ED Triage Notes (Signed)
Presents with head injury that occurred about 11:10 this AM, pt tripped and fell and hit her right hip and occiput of head on the driveway. She reports tenderness to the back of the head. Denies hip. Was able to stand without assistance. Denies LOC. Family lost her husband, their father to a head injury and is very concerned. Pt takes 81 mg ASA daily.

## 2017-06-11 NOTE — ED Provider Notes (Signed)
[facility] EMERGENCY DEPARTMENT Provider Note   CSN: [id number] Arrival date & time: 06/11/17  1148     History   Chief Complaint Chief Complaint  Patient presents with  . Fall    HPI [name] is a 84 y.o. female.  HPI   Going to try to get in car to go to beauty shop and daughter, stepped on black ice, hit right hip and head, sat up, talking normally, no LOC.  Occurred at [place] a little sore, doesn't think broken. Stood up and walked.  No numbness/weakness, one side or other.  No other injuries, no chest pain, dyspnea, cough, other concerns.  Patient denies headache, but does report some neck soreness.  Reports pain around hematoma of her head, but denies overall headache, nausea or vomiting.  Reports that the left side of her lower back is somewhat sore. No ton blod thinners     Past Medical History:  Diagnosis Date  . Adenomatous colon polyp 2007  . Arthritis   . CAD (coronary artery disease)   . Chronic back pain   . Gall stone pancreatitis   . Heart murmur   . High cholesterol   . Hx: UTI (urinary tract infection)    Now infrequent  . Hypertension   . Left knee pain Nov. 18, 2014  . Onychomycosis   . Osteoarthritis   . Osteopenia   . PAD (peripheral artery disease) ([place])   . PVD (peripheral vascular disease) ([place])   . Skin ulcer of left heel ([place])   . Trigger finger, right   . Vertigo     Patient Active Problem List   Diagnosis Date Noted  . Pain in limb-Left Knee 04/04/2013  . Peripheral vascular disease, unspecified ([place]) 04/04/2013  . Choledocholithiasis with obstruction 10/30/2011  . PVD (peripheral vascular disease) ([place]) 10/29/2011  . Pancreatitis 10/28/2011  . Cholelithiasis 10/28/2011  . UTI (urinary tract infection) 10/28/2011  . Hypertension 10/28/2011  . Hypercholesterolemia 10/28/2011    Past Surgical History:  Procedure Laterality Date  . ABDOMINAL HYSTERECTOMY     partial  . CHOLECYSTECTOMY  10/30/2011   Procedure: LAPAROSCOPIC CHOLECYSTECTOMY WITH INTRAOPERATIVE CHOLANGIOGRAM;  Surgeon: Zenovia Jarred, MD;  Location: [facility];  Service: General;  Laterality: N/A;  . EUS  11/11/2011   Procedure: ESOPHAGEAL ENDOSCOPIC ULTRASOUND (EUS) RADIAL;  Surgeon: Arta Silence, MD;  Location: [facility];  Service: Endoscopy;  Laterality: N/A;  possible ERCP  . JOINT REPLACEMENT Left 06-22-06   Hip  . JOINT REPLACEMENT Right 11-26-06   Shoulder  . JOINT REPLACEMENT Right 02-27-06   Knee  . TOTAL HIP ARTHROPLASTY    . TOTAL KNEE ARTHROPLASTY    . TOTAL SHOULDER ARTHROPLASTY      OB History    No data available       Home Medications    Prior to Admission medications   Medication Sig Start Date End Date Taking? Authorizing Provider  amLODipine (NORVASC) 5 MG tablet Take 5 mg by mouth daily.  08/05/14  Yes [provider]  aspirin 81 MG tablet Take 81 mg by mouth daily.   Yes [provider]  atenolol (TENORMIN) 50 MG tablet Take 50 mg by mouth daily.   Yes [provider]  cetirizine (ZYRTEC) 5 MG tablet Take 5 mg by mouth daily.   Yes [provider]  Cinnamon 500 MG capsule Take 500 mg by mouth daily.   Yes [provider]  clindamycin (CLEOCIN) 150 MG capsule  Take by mouth 3 (three) times daily.   Yes [provider]  losartan (COZAAR) 100 MG tablet Take 100 mg by mouth daily.   Yes [provider]  meclizine (ANTIVERT) 12.5 MG tablet Take 12.5 mg by mouth 3 (three) times daily as needed for dizziness.   Yes [provider]  Pitavastatin Calcium (LIVALO) 1 MG TABS Take by mouth.   Yes [provider]  acetaminophen (TYLENOL) 100 MG/ML solution Take 10 mg/kg by mouth every 4 (four) hours as needed for fever.    [provider]  Biotin 1 MG CAPS Take by mouth.    [provider]  cholecalciferol (VITAMIN D) 1000 UNITS tablet Take 1,000 Units by mouth daily.    [provider]  clopidogrel  (PLAVIX) 75 MG tablet Take 75 mg by mouth daily.    [provider]  donepezil (ARICEPT) 5 MG tablet Take 5 mg by mouth at bedtime.    [provider]  doxazosin (CARDURA) 8 MG tablet  08/20/14   [provider]  rosuvastatin (CRESTOR) 5 MG tablet Take 5 mg by mouth daily.    [provider]  vitamin B-12 (CYANOCOBALAMIN) 1000 MCG tablet Take 1,000 mcg by mouth daily.    [provider]    Family History Family History  Problem Relation Age of Onset  . Cancer Mother        ovarian  . Pneumonia Father   . Heart attack Brother   . Heart disease Brother        Heart Disease before age 36    Social History Social History   Tobacco Use  . Smoking status: Never Smoker  . Smokeless tobacco: Never Used  Substance Use Topics  . Alcohol use: No    Alcohol/week: 0.0 oz  . Drug use: No     Allergies   Cozaar [losartan potassium]; Hydrochlorothiazide; Penicillins; and Sulfa antibiotics   Review of Systems Review of Systems  Constitutional: Negative for fever.  HENT: Negative for sore throat.   Eyes: Negative for visual disturbance.  Respiratory: Negative for cough and shortness of breath.   Cardiovascular: Negative for chest pain.  Gastrointestinal: Negative for abdominal pain, nausea and vomiting.  Genitourinary: Negative for difficulty urinating.  Musculoskeletal: Positive for myalgias and neck pain. Negative for back pain.  Skin: Negative for rash.  Neurological: Positive for headaches (over hematoma ). Negative for syncope, facial asymmetry, speech difficulty, weakness and numbness.     Physical Exam Updated Vital Signs BP (!) 156/61 (BP Location: Right Arm)   Pulse 68   Temp 98 F (36.7 C) (Oral)   Resp 18   Wt 66.2 kg (146 lb)   SpO2 99%   BMI 25.86 kg/m   Physical Exam  Constitutional: She is oriented to person, place, and time. She appears well-developed and well-nourished. No distress.  HENT:  Head: Normocephalic.   Hematoma crown of head  Eyes: Conjunctivae and EOM are normal. Pupils are equal, round, and reactive to light.  Neck: Normal range of motion.  Cardiovascular: Normal rate, regular rhythm, normal heart sounds and intact distal pulses. Exam reveals no gallop and no friction rub.  No murmur heard. Pulmonary/Chest: Effort normal and breath sounds normal. No respiratory distress. She has no wheezes. She has no rales.  Abdominal: Soft. She exhibits no distension. There is no tenderness. There is no guarding.  Musculoskeletal: She exhibits no edema.       Cervical back: She exhibits tenderness. She exhibits no  bony tenderness.       Thoracic back: She exhibits no bony tenderness.       Lumbar back: She exhibits tenderness (paraspinal muscles). She exhibits no bony tenderness.  Neurological: She is alert and oriented to person, place, and time. She has normal strength. No cranial nerve deficit or sensory deficit. Coordination normal. GCS eye subscore is 4. GCS verbal subscore is 5. GCS motor subscore is 6.  Skin: Skin is warm and dry. No rash noted. She is not diaphoretic. No erythema.  Nursing note and vitals reviewed.    ED Treatments / Results  Labs (all labs ordered are listed, but only abnormal results are displayed) Labs Reviewed - No data to display  EKG  EKG Interpretation None       Radiology Ct Head Wo Contrast  Result Date: 06/11/2017 CLINICAL DATA:  Fall today. Posterior head and neck pain. No loss of consciousness. EXAM: CT HEAD WITHOUT CONTRAST CT CERVICAL SPINE WITHOUT CONTRAST TECHNIQUE: Multidetector CT imaging of the head and cervical spine was performed following the standard protocol without intravenous contrast. Multiplanar CT image reconstructions of the cervical spine were also generated. COMPARISON:  MRI brain 09/18/2014 FINDINGS: CT HEAD FINDINGS Brain: There is no evidence of acute intracranial hemorrhage, mass lesion, brain edema or extra-axial fluid collection.  Mild generalized prominence of the ventricles and subarachnoid spaces consistent with mild atrophy. There is no CT evidence of acute cortical infarction. There are chronic small vessel ischemic changes in the periventricular and subcortical white matter. Vascular: Intracranial vascular calcifications. No hyperdense vessel identified. Skull: Negative for fracture or focal lesion. Sinuses/Orbits: Hypoplastic left maxillary sinus, possibly due to prior injury or chronic sinusitis. Minimal dependent opacity in the right division of the sphenoid sinus. No air-fluid levels. The mastoid air cells and middle ears are clear. No acute orbital findings. Other: Focal soft tissue swelling in the right parietal scalp. TMJ degenerative changes bilaterally. CT CERVICAL SPINE FINDINGS Alignment: Normal. Skull base and vertebrae: No evidence of acute cervical spine fracture or traumatic subluxation. There is multilevel spondylosis with disc space loss and uncinate spurring. The facet joints are fused on the right at C2-3, C4-5 and C7-T1. On the left, the facet joints are fused at C4-5 and C7-T1. Soft tissues and spinal canal: No prevertebral fluid or swelling. No visible canal hematoma. Disc levels: No evidence of acute soft disc herniation. Uncinate spurring and facet hypertrophy contribute to foraminal narrowing, especially on the right at C3-4 and bilaterally at C5-6. Upper chest: Bilateral carotid atherosclerosis. Other: None. IMPRESSION: 1. No acute intracranial or calvarial findings. 2. Soft tissue swelling at the right parietal vertex. 3. No evidence of acute cervical spine fracture, traumatic subluxation or static signs of instability. 4. Multilevel cervical spondylosis as described. Electronically Signed   By: Richardean Sale M.D.   On: 06/11/2017 13:22   Ct Cervical Spine Wo Contrast  Result Date: 06/11/2017 CLINICAL DATA:  Fall today. Posterior head and neck pain. No loss of consciousness. EXAM: CT HEAD WITHOUT  CONTRAST CT CERVICAL SPINE WITHOUT CONTRAST TECHNIQUE: Multidetector CT imaging of the head and cervical spine was performed following the standard protocol without intravenous contrast. Multiplanar CT image reconstructions of the cervical spine were also generated. COMPARISON:  MRI brain 09/18/2014 FINDINGS: CT HEAD FINDINGS Brain: There is no evidence of acute intracranial hemorrhage, mass lesion, brain edema or extra-axial fluid collection. Mild generalized prominence of the ventricles and subarachnoid spaces consistent with mild atrophy. There is no CT evidence of acute cortical infarction.  There are chronic small vessel ischemic changes in the periventricular and subcortical white matter. Vascular: Intracranial vascular calcifications. No hyperdense vessel identified. Skull: Negative for fracture or focal lesion. Sinuses/Orbits: Hypoplastic left maxillary sinus, possibly due to prior injury or chronic sinusitis. Minimal dependent opacity in the right division of the sphenoid sinus. No air-fluid levels. The mastoid air cells and middle ears are clear. No acute orbital findings. Other: Focal soft tissue swelling in the right parietal scalp. TMJ degenerative changes bilaterally. CT CERVICAL SPINE FINDINGS Alignment: Normal. Skull base and vertebrae: No evidence of acute cervical spine fracture or traumatic subluxation. There is multilevel spondylosis with disc space loss and uncinate spurring. The facet joints are fused on the right at C2-3, C4-5 and C7-T1. On the left, the facet joints are fused at C4-5 and C7-T1. Soft tissues and spinal canal: No prevertebral fluid or swelling. No visible canal hematoma. Disc levels: No evidence of acute soft disc herniation. Uncinate spurring and facet hypertrophy contribute to foraminal narrowing, especially on the right at C3-4 and bilaterally at C5-6. Upper chest: Bilateral carotid atherosclerosis. Other: None. IMPRESSION: 1. No acute intracranial or calvarial findings. 2.  Soft tissue swelling at the right parietal vertex. 3. No evidence of acute cervical spine fracture, traumatic subluxation or static signs of instability. 4. Multilevel cervical spondylosis as described. Electronically Signed   By: Richardean Sale M.D.   On: 06/11/2017 13:22    Procedures Procedures (including critical care time)  Medications Ordered in ED Medications - No data to display   Initial Impression / Assessment and Plan / ED Course  I have reviewed the triage vital signs and the nursing notes.  Pertinent labs & imaging results that were available during my care of the patient were reviewed by me and considered in my medical decision making (see chart for details).     84 year old female with history above presents with concern for mechanical fall on black ice with head injury.  CT head and cervical spine were completed which showed no significant abnormalities.  She otherwise has a normal neurologic exam.  He has full range of motion of the hip, was ambulatory after the fall, without significant hip pain and have low suspicion for fracture.  She does not have any midline thoracic or lumbar spinal tenderness, and have low suspicion for fracture.  She does report some soreness.  Recommend Tylenol and ibuprofen for pain.  Discussed return precautions with family. Patient discharged in stable condition with understanding of reasons to return.   Final Clinical Impressions(s) / ED Diagnoses   Final diagnoses:  Fall, initial encounter  Hematoma  Strain of neck muscle, initial encounter    ED Discharge Orders    None       Gareth Morgan, MD 06/11/17 [phone]

## 2017-06-15 DIAGNOSIS — M419 Scoliosis, unspecified: Secondary | ICD-10-CM | POA: Diagnosis not present

## 2017-06-15 DIAGNOSIS — M47816 Spondylosis without myelopathy or radiculopathy, lumbar region: Secondary | ICD-10-CM | POA: Diagnosis not present

## 2017-06-15 DIAGNOSIS — M5442 Lumbago with sciatica, left side: Secondary | ICD-10-CM | POA: Diagnosis not present

## 2017-06-15 DIAGNOSIS — M5441 Lumbago with sciatica, right side: Secondary | ICD-10-CM | POA: Diagnosis not present

## 2017-06-15 DIAGNOSIS — M5090 Cervical disc disorder, unspecified, unspecified cervical region: Secondary | ICD-10-CM | POA: Diagnosis not present

## 2017-06-15 DIAGNOSIS — M5136 Other intervertebral disc degeneration, lumbar region: Secondary | ICD-10-CM | POA: Diagnosis not present

## 2017-06-17 DIAGNOSIS — M419 Scoliosis, unspecified: Secondary | ICD-10-CM | POA: Diagnosis not present

## 2017-06-17 DIAGNOSIS — M5136 Other intervertebral disc degeneration, lumbar region: Secondary | ICD-10-CM | POA: Diagnosis not present

## 2017-06-17 DIAGNOSIS — M5441 Lumbago with sciatica, right side: Secondary | ICD-10-CM | POA: Diagnosis not present

## 2017-06-17 DIAGNOSIS — M47816 Spondylosis without myelopathy or radiculopathy, lumbar region: Secondary | ICD-10-CM | POA: Diagnosis not present

## 2017-06-17 DIAGNOSIS — M5442 Lumbago with sciatica, left side: Secondary | ICD-10-CM | POA: Diagnosis not present

## 2017-06-17 DIAGNOSIS — M5090 Cervical disc disorder, unspecified, unspecified cervical region: Secondary | ICD-10-CM | POA: Diagnosis not present

## 2017-06-22 DIAGNOSIS — M419 Scoliosis, unspecified: Secondary | ICD-10-CM | POA: Diagnosis not present

## 2017-06-22 DIAGNOSIS — M5136 Other intervertebral disc degeneration, lumbar region: Secondary | ICD-10-CM | POA: Diagnosis not present

## 2017-06-22 DIAGNOSIS — M5441 Lumbago with sciatica, right side: Secondary | ICD-10-CM | POA: Diagnosis not present

## 2017-06-22 DIAGNOSIS — M5442 Lumbago with sciatica, left side: Secondary | ICD-10-CM | POA: Diagnosis not present

## 2017-06-22 DIAGNOSIS — M5090 Cervical disc disorder, unspecified, unspecified cervical region: Secondary | ICD-10-CM | POA: Diagnosis not present

## 2017-06-22 DIAGNOSIS — M47816 Spondylosis without myelopathy or radiculopathy, lumbar region: Secondary | ICD-10-CM | POA: Diagnosis not present

## 2017-06-24 DIAGNOSIS — M419 Scoliosis, unspecified: Secondary | ICD-10-CM | POA: Diagnosis not present

## 2017-06-24 DIAGNOSIS — M5136 Other intervertebral disc degeneration, lumbar region: Secondary | ICD-10-CM | POA: Diagnosis not present

## 2017-06-24 DIAGNOSIS — M5442 Lumbago with sciatica, left side: Secondary | ICD-10-CM | POA: Diagnosis not present

## 2017-06-24 DIAGNOSIS — M5090 Cervical disc disorder, unspecified, unspecified cervical region: Secondary | ICD-10-CM | POA: Diagnosis not present

## 2017-06-24 DIAGNOSIS — M5441 Lumbago with sciatica, right side: Secondary | ICD-10-CM | POA: Diagnosis not present

## 2017-06-24 DIAGNOSIS — M47816 Spondylosis without myelopathy or radiculopathy, lumbar region: Secondary | ICD-10-CM | POA: Diagnosis not present

## 2017-06-28 DIAGNOSIS — R413 Other amnesia: Secondary | ICD-10-CM | POA: Diagnosis not present

## 2017-06-28 DIAGNOSIS — Z1231 Encounter for screening mammogram for malignant neoplasm of breast: Secondary | ICD-10-CM | POA: Diagnosis not present

## 2017-06-28 DIAGNOSIS — Z79899 Other long term (current) drug therapy: Secondary | ICD-10-CM | POA: Diagnosis not present

## 2017-06-28 DIAGNOSIS — I10 Essential (primary) hypertension: Secondary | ICD-10-CM | POA: Diagnosis not present

## 2017-06-30 DIAGNOSIS — M5442 Lumbago with sciatica, left side: Secondary | ICD-10-CM | POA: Diagnosis not present

## 2017-06-30 DIAGNOSIS — M419 Scoliosis, unspecified: Secondary | ICD-10-CM | POA: Diagnosis not present

## 2017-06-30 DIAGNOSIS — M5441 Lumbago with sciatica, right side: Secondary | ICD-10-CM | POA: Diagnosis not present

## 2017-06-30 DIAGNOSIS — M47816 Spondylosis without myelopathy or radiculopathy, lumbar region: Secondary | ICD-10-CM | POA: Diagnosis not present

## 2017-06-30 DIAGNOSIS — M5090 Cervical disc disorder, unspecified, unspecified cervical region: Secondary | ICD-10-CM | POA: Diagnosis not present

## 2017-06-30 DIAGNOSIS — M5136 Other intervertebral disc degeneration, lumbar region: Secondary | ICD-10-CM | POA: Diagnosis not present

## 2017-07-01 DIAGNOSIS — M5136 Other intervertebral disc degeneration, lumbar region: Secondary | ICD-10-CM | POA: Diagnosis not present

## 2017-07-01 DIAGNOSIS — M5441 Lumbago with sciatica, right side: Secondary | ICD-10-CM | POA: Diagnosis not present

## 2017-07-01 DIAGNOSIS — M47816 Spondylosis without myelopathy or radiculopathy, lumbar region: Secondary | ICD-10-CM | POA: Diagnosis not present

## 2017-07-01 DIAGNOSIS — M419 Scoliosis, unspecified: Secondary | ICD-10-CM | POA: Diagnosis not present

## 2017-07-01 DIAGNOSIS — M5090 Cervical disc disorder, unspecified, unspecified cervical region: Secondary | ICD-10-CM | POA: Diagnosis not present

## 2017-07-01 DIAGNOSIS — M5442 Lumbago with sciatica, left side: Secondary | ICD-10-CM | POA: Diagnosis not present

## 2017-07-06 DIAGNOSIS — M5136 Other intervertebral disc degeneration, lumbar region: Secondary | ICD-10-CM | POA: Diagnosis not present

## 2017-07-06 DIAGNOSIS — M5090 Cervical disc disorder, unspecified, unspecified cervical region: Secondary | ICD-10-CM | POA: Diagnosis not present

## 2017-07-06 DIAGNOSIS — M419 Scoliosis, unspecified: Secondary | ICD-10-CM | POA: Diagnosis not present

## 2017-07-06 DIAGNOSIS — M5441 Lumbago with sciatica, right side: Secondary | ICD-10-CM | POA: Diagnosis not present

## 2017-07-06 DIAGNOSIS — M5442 Lumbago with sciatica, left side: Secondary | ICD-10-CM | POA: Diagnosis not present

## 2017-07-06 DIAGNOSIS — M47816 Spondylosis without myelopathy or radiculopathy, lumbar region: Secondary | ICD-10-CM | POA: Diagnosis not present

## 2017-07-08 DIAGNOSIS — M5136 Other intervertebral disc degeneration, lumbar region: Secondary | ICD-10-CM | POA: Diagnosis not present

## 2017-07-08 DIAGNOSIS — M419 Scoliosis, unspecified: Secondary | ICD-10-CM | POA: Diagnosis not present

## 2017-07-08 DIAGNOSIS — M5442 Lumbago with sciatica, left side: Secondary | ICD-10-CM | POA: Diagnosis not present

## 2017-07-08 DIAGNOSIS — M5090 Cervical disc disorder, unspecified, unspecified cervical region: Secondary | ICD-10-CM | POA: Diagnosis not present

## 2017-07-08 DIAGNOSIS — M47816 Spondylosis without myelopathy or radiculopathy, lumbar region: Secondary | ICD-10-CM | POA: Diagnosis not present

## 2017-07-08 DIAGNOSIS — M5441 Lumbago with sciatica, right side: Secondary | ICD-10-CM | POA: Diagnosis not present

## 2017-07-09 DIAGNOSIS — N390 Urinary tract infection, site not specified: Secondary | ICD-10-CM | POA: Diagnosis not present

## 2017-07-13 DIAGNOSIS — M5136 Other intervertebral disc degeneration, lumbar region: Secondary | ICD-10-CM | POA: Diagnosis not present

## 2017-07-13 DIAGNOSIS — M5441 Lumbago with sciatica, right side: Secondary | ICD-10-CM | POA: Diagnosis not present

## 2017-07-13 DIAGNOSIS — M419 Scoliosis, unspecified: Secondary | ICD-10-CM | POA: Diagnosis not present

## 2017-07-13 DIAGNOSIS — M5442 Lumbago with sciatica, left side: Secondary | ICD-10-CM | POA: Diagnosis not present

## 2017-07-13 DIAGNOSIS — M5090 Cervical disc disorder, unspecified, unspecified cervical region: Secondary | ICD-10-CM | POA: Diagnosis not present

## 2017-07-13 DIAGNOSIS — M47816 Spondylosis without myelopathy or radiculopathy, lumbar region: Secondary | ICD-10-CM | POA: Diagnosis not present

## 2017-07-15 DIAGNOSIS — I493 Ventricular premature depolarization: Secondary | ICD-10-CM | POA: Diagnosis not present

## 2017-07-15 DIAGNOSIS — M5136 Other intervertebral disc degeneration, lumbar region: Secondary | ICD-10-CM | POA: Diagnosis not present

## 2017-07-15 DIAGNOSIS — I10 Essential (primary) hypertension: Secondary | ICD-10-CM | POA: Diagnosis not present

## 2017-07-15 DIAGNOSIS — M5442 Lumbago with sciatica, left side: Secondary | ICD-10-CM | POA: Diagnosis not present

## 2017-07-15 DIAGNOSIS — R001 Bradycardia, unspecified: Secondary | ICD-10-CM | POA: Diagnosis not present

## 2017-07-15 DIAGNOSIS — I951 Orthostatic hypotension: Secondary | ICD-10-CM | POA: Diagnosis not present

## 2017-07-15 DIAGNOSIS — M5090 Cervical disc disorder, unspecified, unspecified cervical region: Secondary | ICD-10-CM | POA: Diagnosis not present

## 2017-07-15 DIAGNOSIS — M47816 Spondylosis without myelopathy or radiculopathy, lumbar region: Secondary | ICD-10-CM | POA: Diagnosis not present

## 2017-07-15 DIAGNOSIS — M5441 Lumbago with sciatica, right side: Secondary | ICD-10-CM | POA: Diagnosis not present

## 2017-07-15 DIAGNOSIS — M419 Scoliosis, unspecified: Secondary | ICD-10-CM | POA: Diagnosis not present

## 2017-08-04 DIAGNOSIS — H5203 Hypermetropia, bilateral: Secondary | ICD-10-CM | POA: Diagnosis not present

## 2017-08-04 DIAGNOSIS — Z961 Presence of intraocular lens: Secondary | ICD-10-CM | POA: Diagnosis not present

## 2017-10-25 DIAGNOSIS — E7800 Pure hypercholesterolemia, unspecified: Secondary | ICD-10-CM | POA: Diagnosis not present

## 2017-10-25 DIAGNOSIS — I129 Hypertensive chronic kidney disease with stage 1 through stage 4 chronic kidney disease, or unspecified chronic kidney disease: Secondary | ICD-10-CM | POA: Diagnosis not present

## 2017-10-25 DIAGNOSIS — L989 Disorder of the skin and subcutaneous tissue, unspecified: Secondary | ICD-10-CM | POA: Diagnosis not present

## 2017-10-25 DIAGNOSIS — Z79899 Other long term (current) drug therapy: Secondary | ICD-10-CM | POA: Diagnosis not present

## 2017-10-25 DIAGNOSIS — N183 Chronic kidney disease, stage 3 (moderate): Secondary | ICD-10-CM | POA: Diagnosis not present

## 2017-10-28 DIAGNOSIS — C44111 Basal cell carcinoma of skin of unspecified eyelid, including canthus: Secondary | ICD-10-CM | POA: Diagnosis not present

## 2017-10-28 DIAGNOSIS — D485 Neoplasm of uncertain behavior of skin: Secondary | ICD-10-CM | POA: Diagnosis not present

## 2017-10-28 DIAGNOSIS — R58 Hemorrhage, not elsewhere classified: Secondary | ICD-10-CM | POA: Diagnosis not present

## 2017-10-28 DIAGNOSIS — D239 Other benign neoplasm of skin, unspecified: Secondary | ICD-10-CM | POA: Diagnosis not present

## 2017-10-28 DIAGNOSIS — L821 Other seborrheic keratosis: Secondary | ICD-10-CM | POA: Diagnosis not present

## 2017-10-28 DIAGNOSIS — L859 Epidermal thickening, unspecified: Secondary | ICD-10-CM | POA: Diagnosis not present

## 2017-11-19 DIAGNOSIS — R300 Dysuria: Secondary | ICD-10-CM | POA: Diagnosis not present

## 2017-11-19 DIAGNOSIS — R112 Nausea with vomiting, unspecified: Secondary | ICD-10-CM | POA: Diagnosis not present

## 2017-11-19 DIAGNOSIS — R8290 Unspecified abnormal findings in urine: Secondary | ICD-10-CM | POA: Diagnosis not present

## 2017-11-19 DIAGNOSIS — N183 Chronic kidney disease, stage 3 (moderate): Secondary | ICD-10-CM | POA: Diagnosis not present

## 2017-11-19 DIAGNOSIS — I129 Hypertensive chronic kidney disease with stage 1 through stage 4 chronic kidney disease, or unspecified chronic kidney disease: Secondary | ICD-10-CM | POA: Diagnosis not present

## 2017-11-19 DIAGNOSIS — M549 Dorsalgia, unspecified: Secondary | ICD-10-CM | POA: Diagnosis not present

## 2017-12-18 DIAGNOSIS — N390 Urinary tract infection, site not specified: Secondary | ICD-10-CM | POA: Diagnosis not present

## 2017-12-18 DIAGNOSIS — R300 Dysuria: Secondary | ICD-10-CM | POA: Diagnosis not present

## 2017-12-23 DIAGNOSIS — R05 Cough: Secondary | ICD-10-CM | POA: Diagnosis not present

## 2017-12-23 DIAGNOSIS — S3992XA Unspecified injury of lower back, initial encounter: Secondary | ICD-10-CM | POA: Diagnosis not present

## 2017-12-23 DIAGNOSIS — W19XXXA Unspecified fall, initial encounter: Secondary | ICD-10-CM | POA: Diagnosis not present

## 2017-12-25 ENCOUNTER — Emergency Department (HOSPITAL_BASED_OUTPATIENT_CLINIC_OR_DEPARTMENT_OTHER): Payer: Medicare Other

## 2017-12-25 ENCOUNTER — Emergency Department (HOSPITAL_BASED_OUTPATIENT_CLINIC_OR_DEPARTMENT_OTHER)
Admission: EM | Admit: 2017-12-25 | Discharge: 2017-12-25 | Disposition: A | Discharge status: Home / Self Care | Payer: Medicare Other | Attending: Emergency Medicine | Admitting: Emergency Medicine

## 2017-12-25 ENCOUNTER — Encounter (HOSPITAL_BASED_OUTPATIENT_CLINIC_OR_DEPARTMENT_OTHER): Payer: Self-pay | Admitting: *Deleted

## 2017-12-25 ENCOUNTER — Other Ambulatory Visit: Payer: Self-pay

## 2017-12-25 DIAGNOSIS — Z96611 Presence of right artificial shoulder joint: Secondary | ICD-10-CM | POA: Diagnosis not present

## 2017-12-25 DIAGNOSIS — J069 Acute upper respiratory infection, unspecified: Secondary | ICD-10-CM

## 2017-12-25 DIAGNOSIS — Z96642 Presence of left artificial hip joint: Secondary | ICD-10-CM | POA: Diagnosis not present

## 2017-12-25 DIAGNOSIS — Z7982 Long term (current) use of aspirin: Secondary | ICD-10-CM | POA: Insufficient documentation

## 2017-12-25 DIAGNOSIS — Z7902 Long term (current) use of antithrombotics/antiplatelets: Secondary | ICD-10-CM | POA: Diagnosis not present

## 2017-12-25 DIAGNOSIS — R05 Cough: Secondary | ICD-10-CM | POA: Diagnosis not present

## 2017-12-25 DIAGNOSIS — Z79899 Other long term (current) drug therapy: Secondary | ICD-10-CM | POA: Diagnosis not present

## 2017-12-25 DIAGNOSIS — E871 Hypo-osmolality and hyponatremia: Secondary | ICD-10-CM | POA: Diagnosis not present

## 2017-12-25 DIAGNOSIS — S3992XA Unspecified injury of lower back, initial encounter: Secondary | ICD-10-CM | POA: Diagnosis not present

## 2017-12-25 DIAGNOSIS — I10 Essential (primary) hypertension: Secondary | ICD-10-CM | POA: Insufficient documentation

## 2017-12-25 DIAGNOSIS — I2510 Atherosclerotic heart disease of native coronary artery without angina pectoris: Secondary | ICD-10-CM | POA: Diagnosis not present

## 2017-12-25 DIAGNOSIS — M545 Low back pain: Secondary | ICD-10-CM | POA: Diagnosis not present

## 2017-12-25 DIAGNOSIS — Z96651 Presence of right artificial knee joint: Secondary | ICD-10-CM | POA: Insufficient documentation

## 2017-12-25 DIAGNOSIS — R531 Weakness: Secondary | ICD-10-CM | POA: Diagnosis not present

## 2017-12-25 LAB — CBC WITH DIFFERENTIAL/PLATELET
Basophils Absolute: 0 10*3/uL (ref 0.0–0.1)
Basophils Relative: 0 %
EOS PCT: 0 %
Eosinophils Absolute: 0 10*3/uL (ref 0.0–0.7)
HEMATOCRIT: 34.6 % — AB (ref 36.0–46.0)
Hemoglobin: 12.2 g/dL (ref 12.0–15.0)
LYMPHS ABS: 1.5 10*3/uL (ref 0.7–4.0)
LYMPHS PCT: 14 %
MCH: 32.1 pg (ref 26.0–34.0)
MCHC: 35.3 g/dL (ref 30.0–36.0)
MCV: 91.1 fL (ref 78.0–100.0)
Monocytes Absolute: 0.9 10*3/uL (ref 0.1–1.0)
Monocytes Relative: 8 %
NEUTROS ABS: 8.1 10*3/uL — AB (ref 1.7–7.7)
Neutrophils Relative %: 78 %
PLATELETS: 258 10*3/uL (ref 150–400)
RBC: 3.8 MIL/uL — AB (ref 3.87–5.11)
RDW: 11.2 % — ABNORMAL LOW (ref 11.5–15.5)
WBC: 10.5 10*3/uL (ref 4.0–10.5)

## 2017-12-25 LAB — COMPREHENSIVE METABOLIC PANEL
ALBUMIN: 3.6 g/dL (ref 3.5–5.0)
ALT: 13 U/L (ref 0–44)
ANION GAP: 11 (ref 5–15)
AST: 23 U/L (ref 15–41)
Alkaline Phosphatase: 67 U/L (ref 38–126)
BUN: 17 mg/dL (ref 8–23)
CHLORIDE: 87 mmol/L — AB (ref 98–111)
CO2: 26 mmol/L (ref 22–32)
Calcium: 8.2 mg/dL — ABNORMAL LOW (ref 8.9–10.3)
Creatinine, Ser: 1.21 mg/dL — ABNORMAL HIGH (ref 0.44–1.00)
GFR calc Af Amer: 46 mL/min — ABNORMAL LOW (ref >60)
GFR, EST NON AFRICAN AMERICAN: 40 mL/min — AB (ref >60)
GLUCOSE: 162 mg/dL — AB (ref 70–99)
POTASSIUM: 3.7 mmol/L (ref 3.5–5.1)
Sodium: 124 mmol/L — ABNORMAL LOW (ref 135–145)
Total Bilirubin: 0.5 mg/dL (ref 0.3–1.2)
Total Protein: 6.4 g/dL — ABNORMAL LOW (ref 6.5–8.1)

## 2017-12-25 LAB — URINALYSIS, ROUTINE W REFLEX MICROSCOPIC
Bilirubin Urine: NEGATIVE
GLUCOSE, UA: NEGATIVE mg/dL
HGB URINE DIPSTICK: NEGATIVE
Ketones, ur: NEGATIVE mg/dL
LEUKOCYTES UA: NEGATIVE
Nitrite: NEGATIVE
PH: 6.5 (ref 5.0–8.0)
Protein, ur: NEGATIVE mg/dL
SPECIFIC GRAVITY, URINE: 1.01 (ref 1.005–1.030)

## 2017-12-25 LAB — TROPONIN I: Troponin I: <0.03 ng/mL (ref <0.03)

## 2017-12-25 LAB — I-STAT CG4 LACTIC ACID, ED: Lactic Acid, Venous: 1.5 mmol/L (ref 0.5–1.9)

## 2017-12-25 IMAGING — DX DG CHEST 2V
2 series · 2 of 2 positions shown · non-contrast
Comparison: [DATE]

CLINICAL DATA: Low back pain after fall last week. Productive
cough.

EXAM:
CHEST - 2 VIEW

[chest lat]
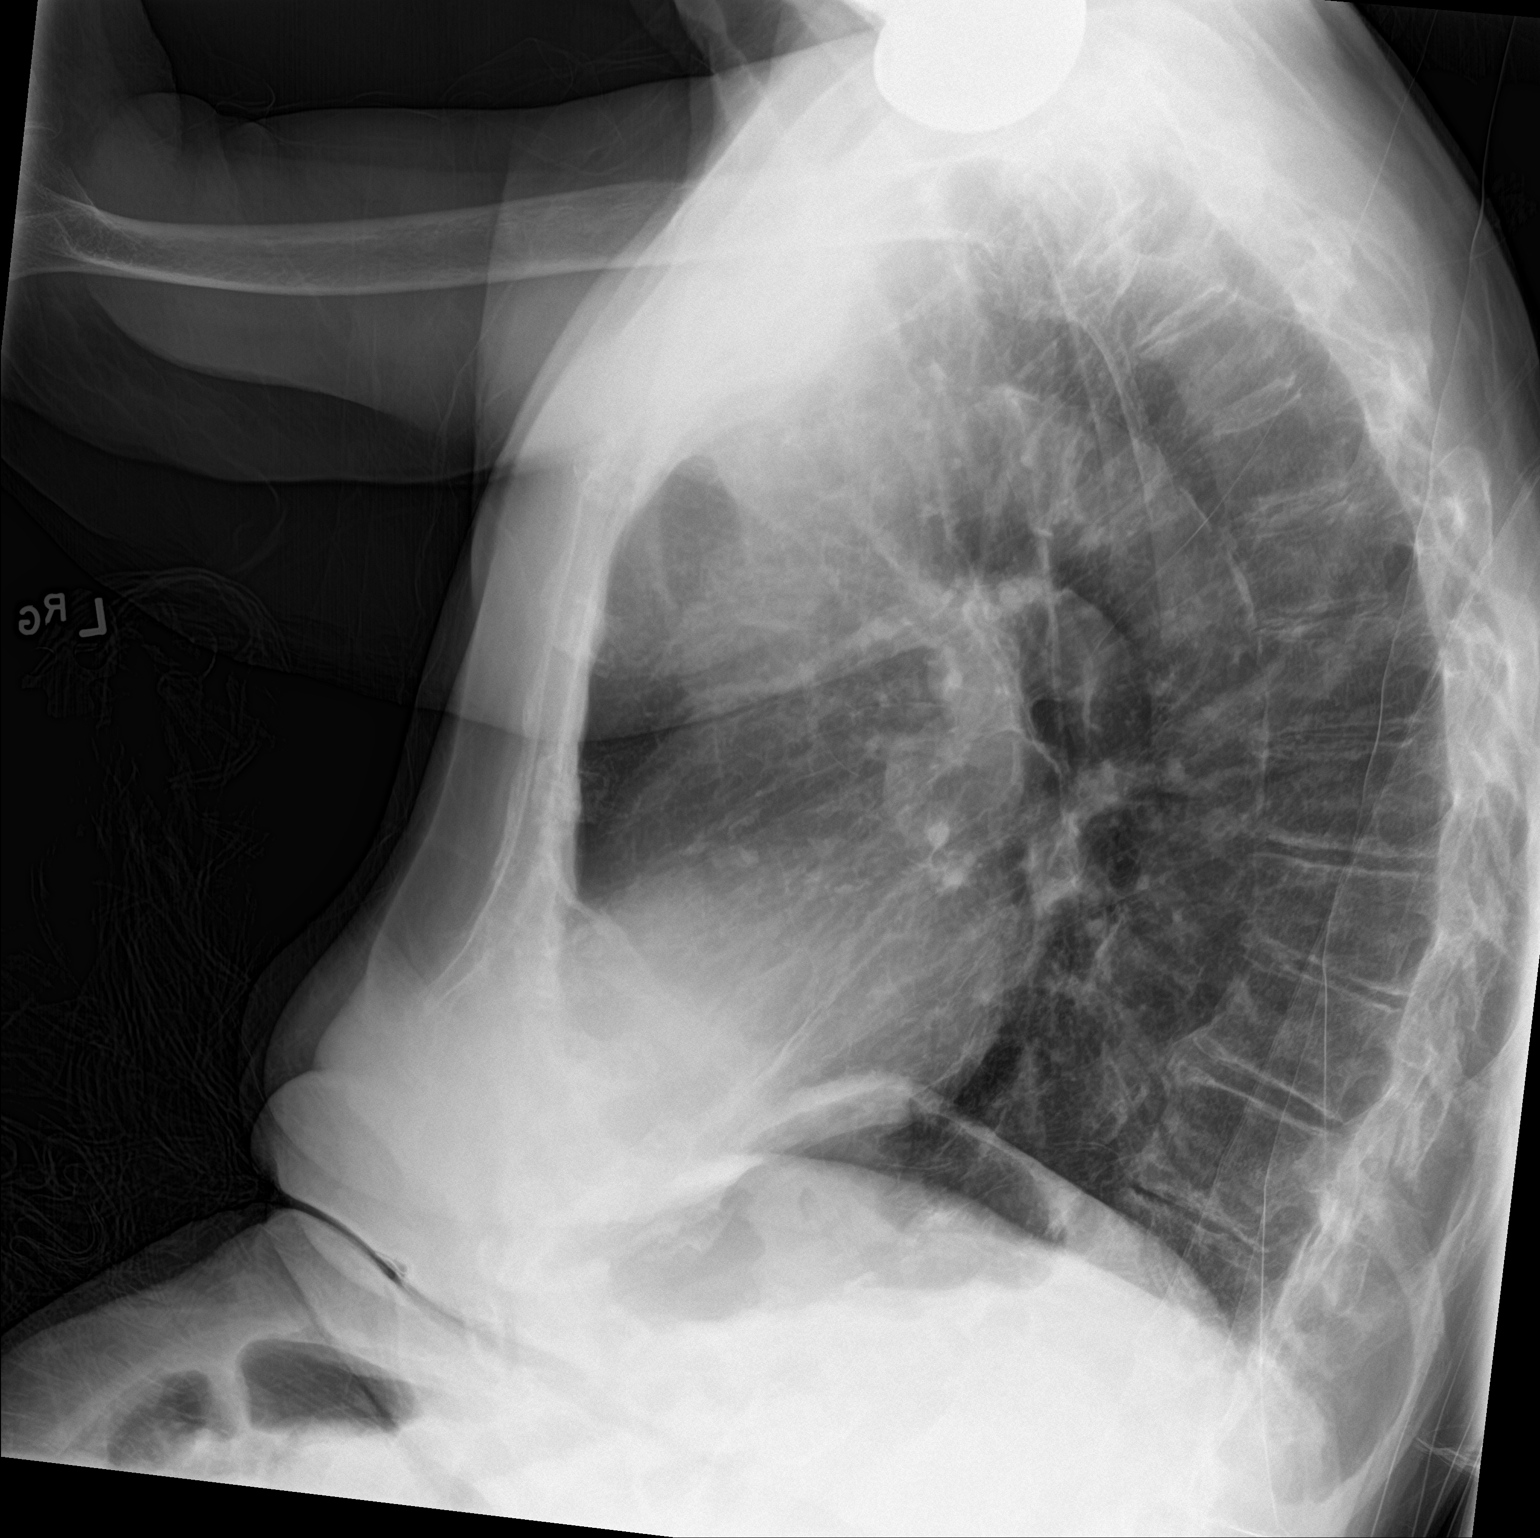

[chest ap strecther]
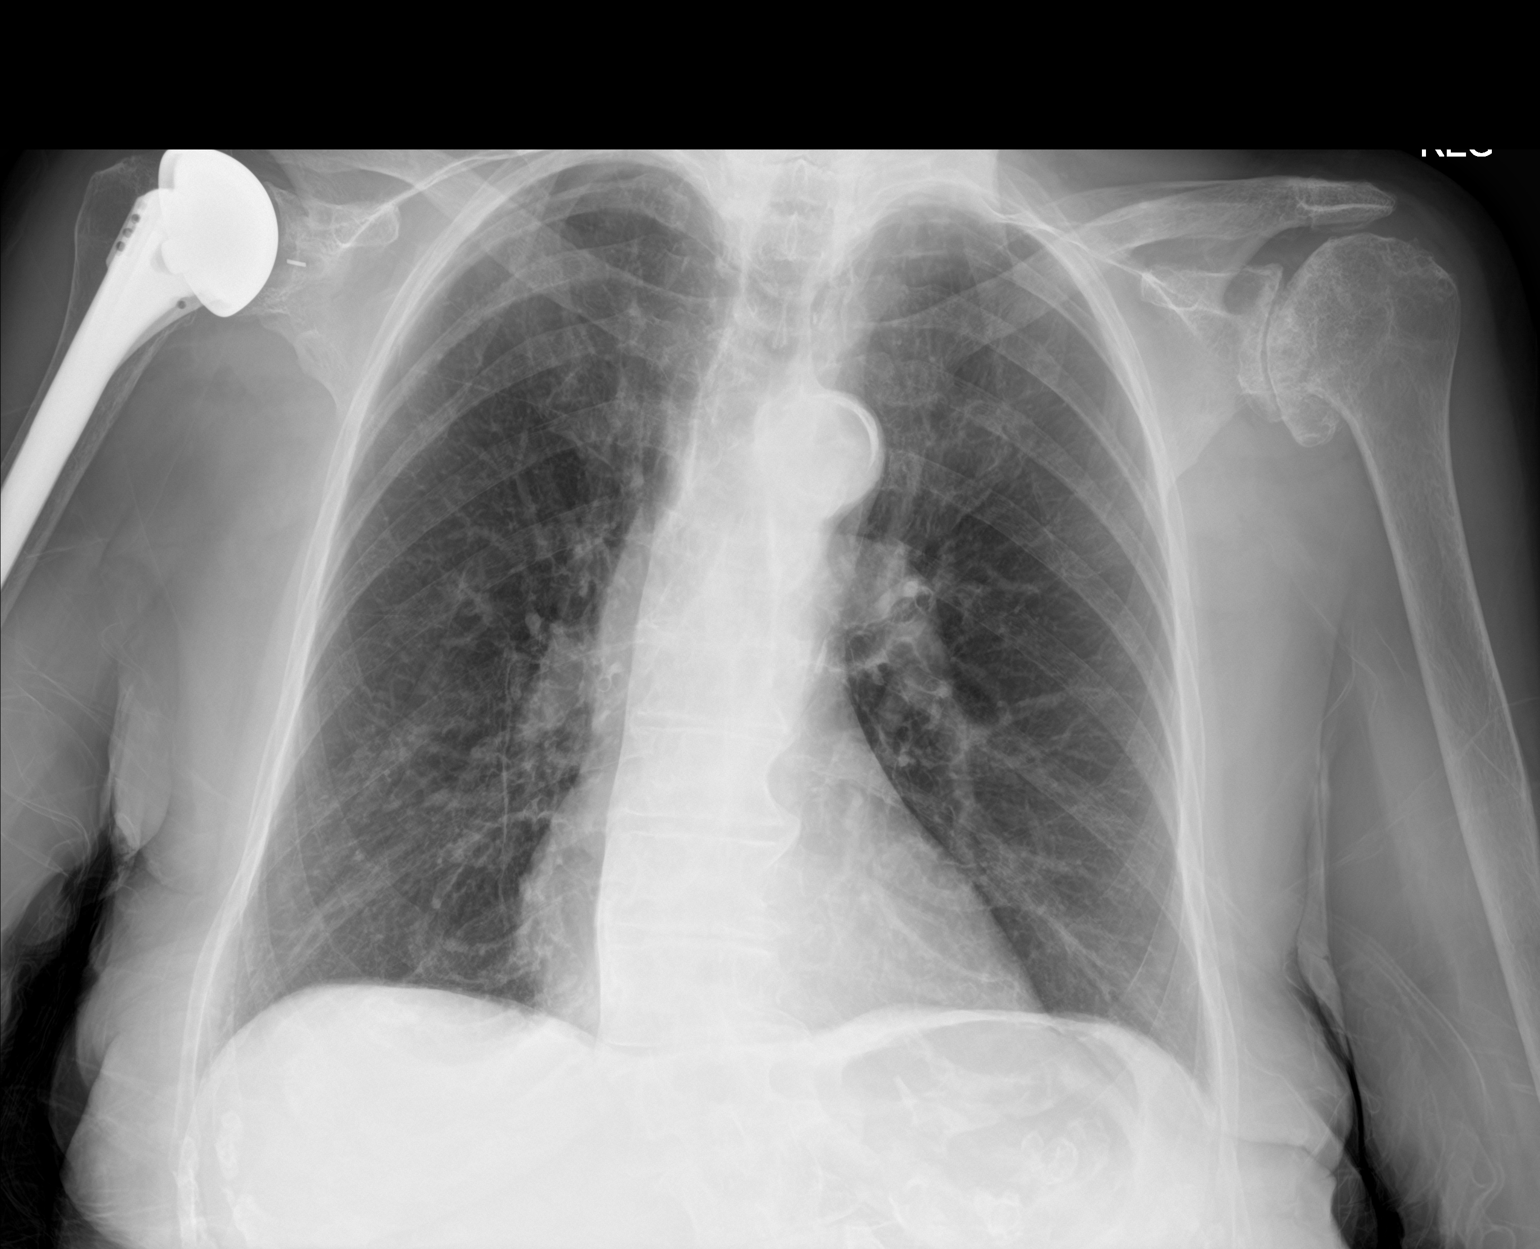

[2 of 2 positions shown; findings below may reference images not displayed]

FINDINGS: The heart, hila, and mediastinum are normal. No pneumothorax. No
nodules or masses. No focal infiltrates. Severe degenerative changes
in the left shoulder. Right shoulder replacement. No other acute
abnormalities identified.
IMPRESSION: No active cardiopulmonary disease.

## 2017-12-25 IMAGING — DX DG LUMBAR SPINE COMPLETE 4+V
5 series · 5 of 5 positions shown · non-contrast
Comparison: [DATE]

CLINICAL DATA: Pain after fall last week

EXAM:
LUMBAR SPINE - COMPLETE 4+ VIEW

[l-spine ap]
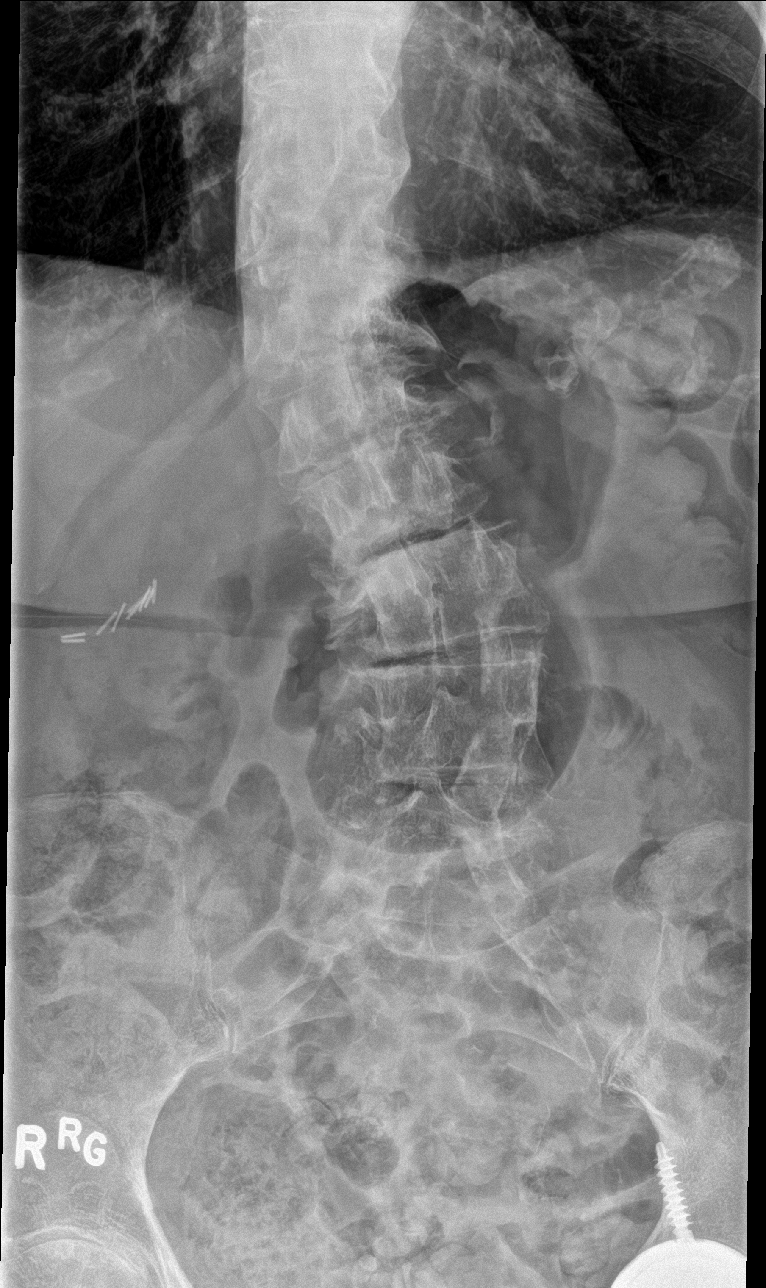

[l-spine obl (1 of 2)]
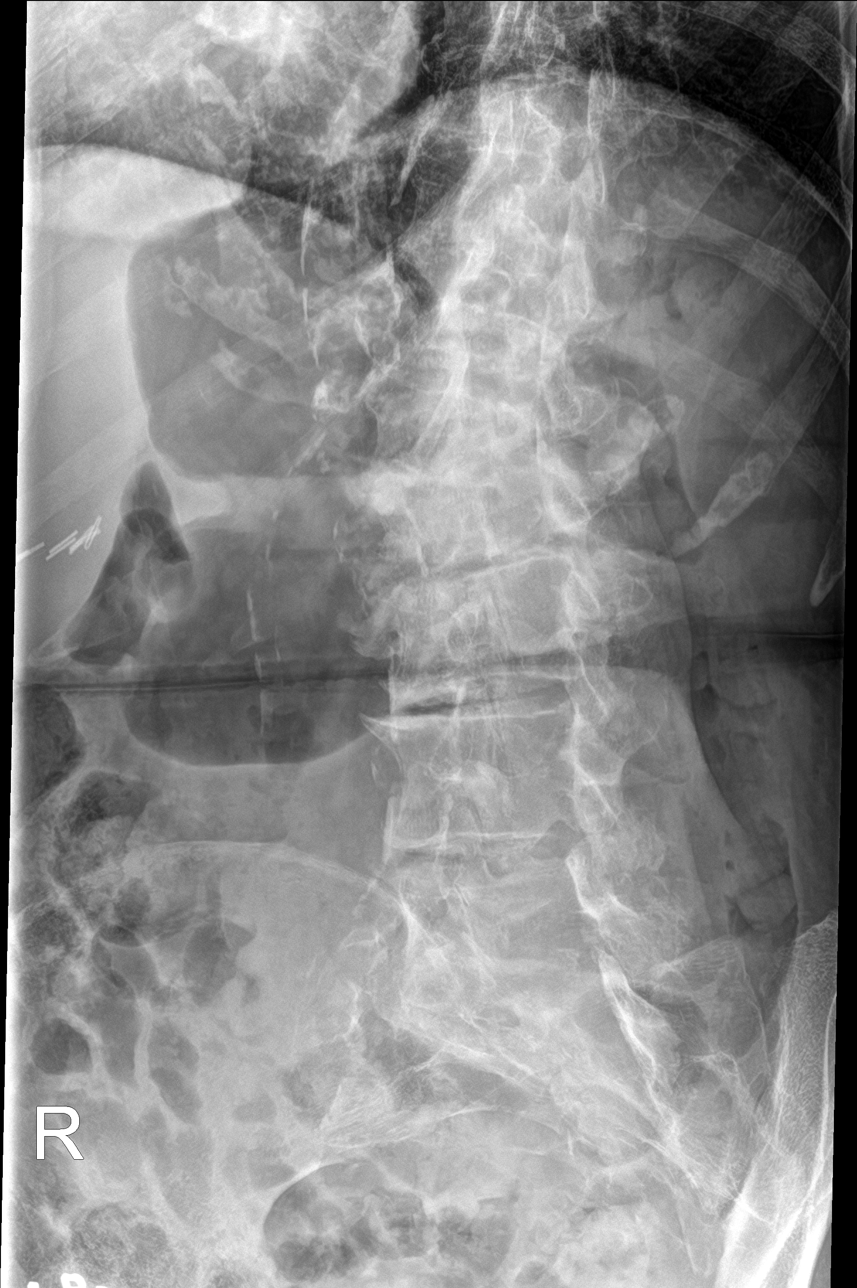

[l-spine obl (2 of 2)]
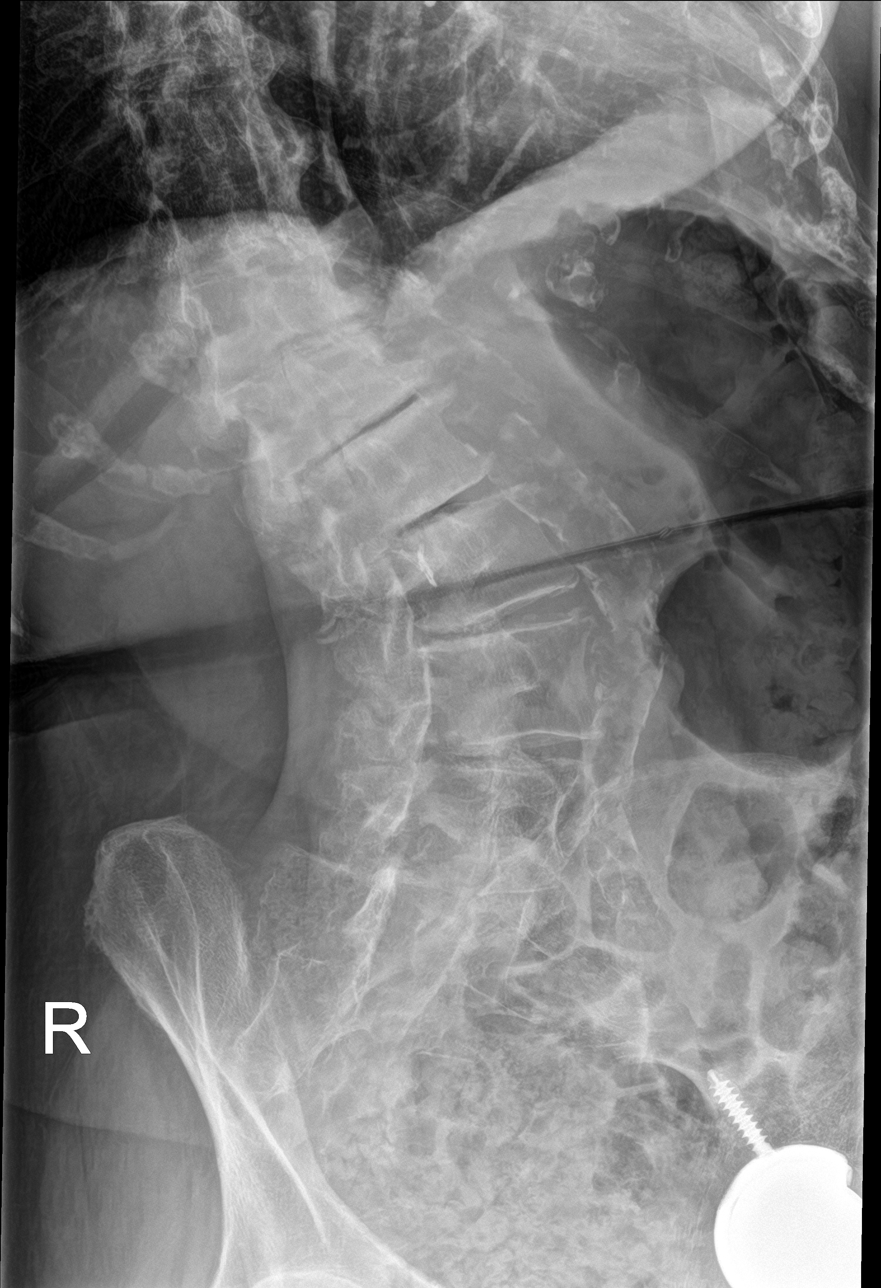

[l-spine lat]
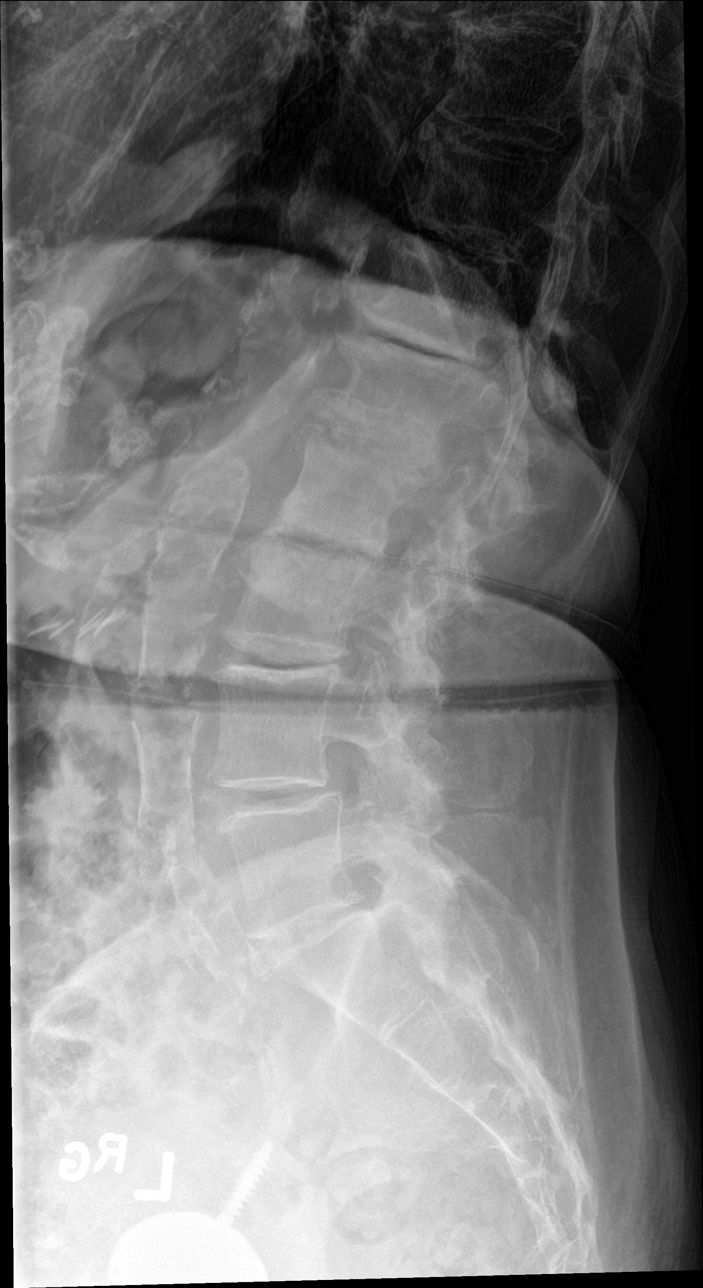

[l-spine spot]
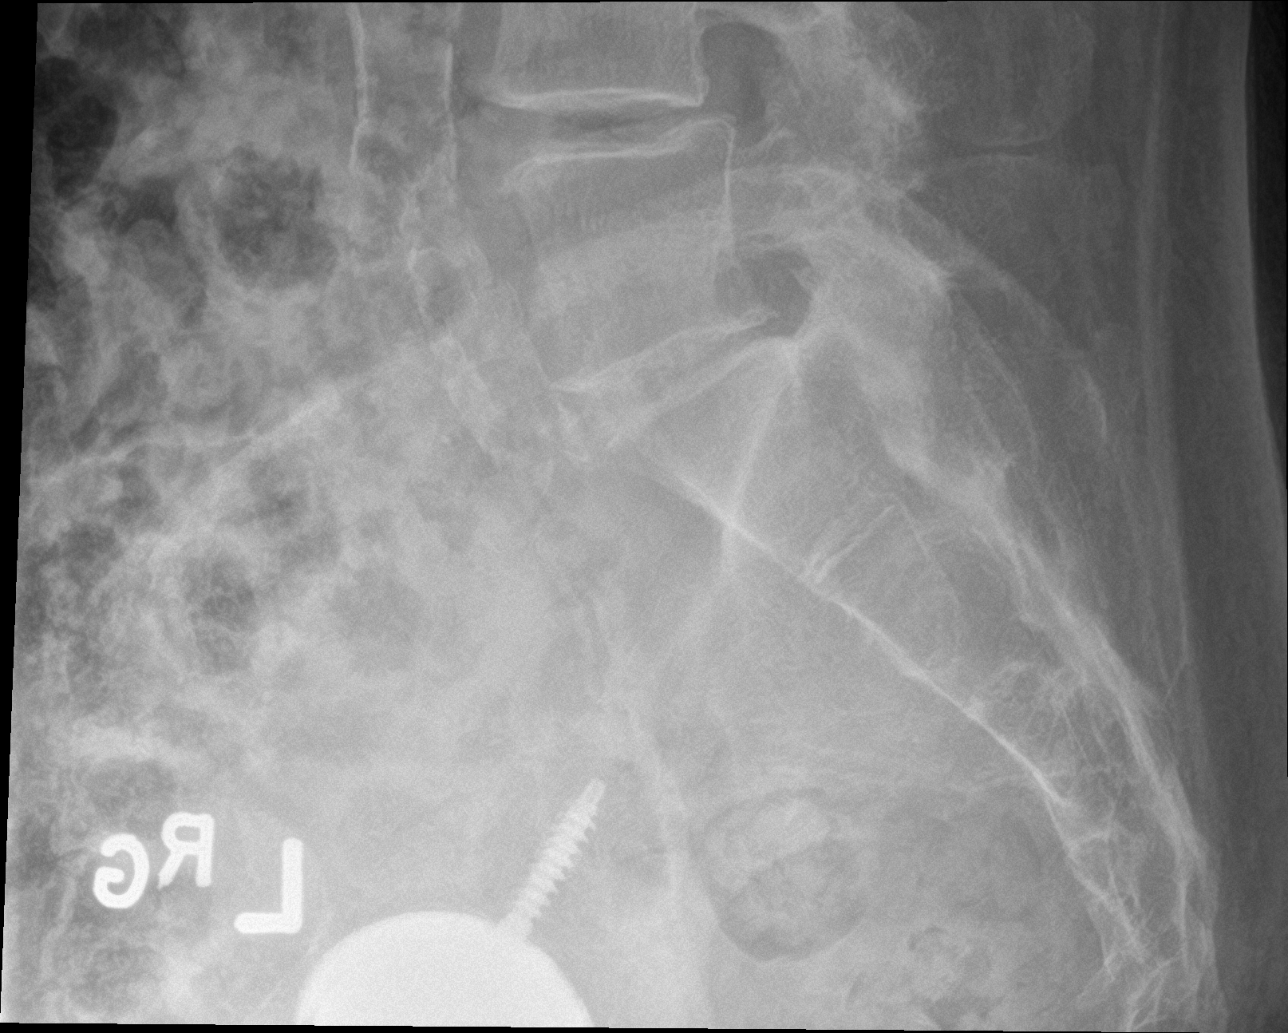

[5 of 5 positions shown; findings below may reference images not displayed]

FINDINGS: Scoliotic curvature lumbar spine, apex to the left. Severe
degenerative changes most marked at T12-L1, L1-L2, and L2-L3. No
acute fracture noted. Lower lumbar facet degenerative changes. No
significant malalignment otherwise seen.
IMPRESSION: Scoliotic and degenerative changes in the spine. No acute fracture
noted.

## 2017-12-25 MED ORDER — SODIUM CHLORIDE 0.9 % IV BOLUS
1000.0000 mL | Freq: Once | INTRAVENOUS | Status: AC
  Administered 2017-12-25: 1000 mL via INTRAVENOUS

## 2017-12-25 NOTE — ED Provider Notes (Signed)
[facility] EMERGENCY DEPARTMENT Provider Note   CSN: [id number] Arrival date & time: 12/25/17  1554     History   Chief Complaint Chief Complaint  Patient presents with  . URI    HPI [name] is a 84 y.o. female.  Pt presents to the ED today with weakness.  Pt had hematuria last week and went to urgent care and was put on cipro for an UTI.  The pt has gotten weaker since then.  She has lost 3 pounds this week.  She missed the last step of stairs and slipped and landed on her bottom.  She did go to her pcp to be evaluated, but no xrays were done.  The pt has not wanted to eat much this week.  She also developed a cough which seems to be getting worse.  No fevers.  Her daughters said she's been falling asleep easier and has not been waking up as easy.     Past Medical History:  Diagnosis Date  . Adenomatous colon polyp 2007  . Arthritis   . CAD (coronary artery disease)   . Chronic back pain   . Gall stone pancreatitis   . Heart murmur   . High cholesterol   . Hx: UTI (urinary tract infection)    Now infrequent  . Hypertension   . Left knee pain Nov. 18, 2014  . Onychomycosis   . Osteoarthritis   . Osteopenia   . PAD (peripheral artery disease) ([place])   . PVD (peripheral vascular disease) ([place])   . Skin ulcer of left heel ([place])   . Trigger finger, right   . Vertigo     Patient Active Problem List   Diagnosis Date Noted  . Pain in limb-Left Knee 04/04/2013  . Peripheral vascular disease, unspecified ([place]) 04/04/2013  . Choledocholithiasis with obstruction 10/30/2011  . PVD (peripheral vascular disease) ([place]) 10/29/2011  . Pancreatitis 10/28/2011  . Cholelithiasis 10/28/2011  . UTI (urinary tract infection) 10/28/2011  . Hypertension 10/28/2011  . Hypercholesterolemia 10/28/2011    Past Surgical History:  Procedure Laterality Date  . ABDOMINAL HYSTERECTOMY     partial  . CHOLECYSTECTOMY  10/30/2011   Procedure: LAPAROSCOPIC CHOLECYSTECTOMY  WITH INTRAOPERATIVE CHOLANGIOGRAM;  Surgeon: Zenovia Jarred, MD;  Location: [facility];  Service: General;  Laterality: N/A;  . EUS  11/11/2011   Procedure: ESOPHAGEAL ENDOSCOPIC ULTRASOUND (EUS) RADIAL;  Surgeon: Arta Silence, MD;  Location: [facility];  Service: Endoscopy;  Laterality: N/A;  possible ERCP  . JOINT REPLACEMENT Left 06-22-06   Hip  . JOINT REPLACEMENT Right 11-26-06   Shoulder  . JOINT REPLACEMENT Right 02-27-06   Knee  . TOTAL HIP ARTHROPLASTY    . TOTAL KNEE ARTHROPLASTY    . TOTAL SHOULDER ARTHROPLASTY       OB History   None      Home Medications    Prior to Admission medications   Medication Sig Start Date End Date Taking? Authorizing Provider  amLODipine (NORVASC) 5 MG tablet Take 5 mg by mouth daily.  08/05/14  Yes [provider]  acetaminophen (TYLENOL) 100 MG/ML solution Take 10 mg/kg by mouth every 4 (four) hours as needed for fever.    [provider]  aspirin 81 MG tablet Take 81 mg by mouth daily.    [provider]  atenolol (TENORMIN) 50 MG tablet Take 50 mg by mouth daily.    [provider]  Biotin 1 MG CAPS Take by mouth.  [provider]  cetirizine (ZYRTEC) 5 MG tablet Take 5 mg by mouth daily.    [provider]  cholecalciferol (VITAMIN D) 1000 UNITS tablet Take 1,000 Units by mouth daily.    [provider]  Cinnamon 500 MG capsule Take 500 mg by mouth daily.    [provider]  clindamycin (CLEOCIN) 150 MG capsule Take by mouth 3 (three) times daily.    [provider]  clopidogrel (PLAVIX) 75 MG tablet Take 75 mg by mouth daily.    [provider]  donepezil (ARICEPT) 5 MG tablet Take 5 mg by mouth at bedtime.    [provider]  doxazosin (CARDURA) 8 MG tablet  08/20/14   [provider]  losartan (COZAAR) 100 MG tablet Take 100 mg by mouth daily.    [provider]  meclizine (ANTIVERT) 12.5 MG tablet Take 12.5 mg by mouth 3  (three) times daily as needed for dizziness.    [provider]  Pitavastatin Calcium (LIVALO) 1 MG TABS Take by mouth.    [provider]  rosuvastatin (CRESTOR) 5 MG tablet Take 5 mg by mouth daily.    [provider]  vitamin B-12 (CYANOCOBALAMIN) 1000 MCG tablet Take 1,000 mcg by mouth daily.    [provider]    Family History Family History  Problem Relation Age of Onset  . Cancer Mother        ovarian  . Pneumonia Father   . Heart attack Brother   . Heart disease Brother        Heart Disease before age 51    Social History Social History   Tobacco Use  . Smoking status: Never Smoker  . Smokeless tobacco: Never Used  Substance Use Topics  . Alcohol use: No    Alcohol/week: 0.0 standard drinks  . Drug use: No     Allergies   Cozaar [losartan potassium]; Hydrochlorothiazide; Penicillins; and Sulfa antibiotics   Review of Systems Review of Systems  Constitutional: Positive for activity change, appetite change and fatigue.  Respiratory: Positive for cough.   Musculoskeletal: Positive for back pain.  Neurological: Positive for weakness.  All other systems reviewed and are negative.    Physical Exam Updated Vital Signs BP (!) 156/60   Pulse 76   Temp 98.6 F (37 C) (Other (Comment))   Resp (!) 21   SpO2 98%   Physical Exam  Constitutional: She is oriented to person, place, and time. She appears well-developed and well-nourished.  HENT:  Head: Normocephalic and atraumatic.  Right Ear: External ear normal.  Left Ear: External ear normal.  Nose: Nose normal.  Mouth/Throat: Oropharynx is clear and moist.  Eyes: Pupils are equal, round, and reactive to light. Conjunctivae and EOM are normal.  Neck: Normal range of motion. Neck supple.  Cardiovascular: Normal rate, regular rhythm, normal heart sounds and intact distal pulses.  Pulmonary/Chest: Effort normal and breath sounds normal.  Abdominal: Soft. Bowel sounds are  normal.  Musculoskeletal:       Lumbar back: She exhibits tenderness.  Neurological: She is alert and oriented to person, place, and time.  Skin: Skin is warm. Capillary refill takes less than 2 seconds.  Psychiatric: She has a normal mood and affect. Her behavior is normal. Judgment and thought content normal.  Nursing note and vitals reviewed.    ED Treatments / Results  Labs (all labs ordered are listed, but only abnormal results are displayed) Labs Reviewed  COMPREHENSIVE METABOLIC PANEL -  Abnormal; Notable for the following components:      Result Value   Sodium 124 (*)    Chloride 87 (*)    Glucose, Bld 162 (*)    Creatinine, Ser 1.21 (*)    Calcium 8.2 (*)    Total Protein 6.4 (*)    GFR calc non Af Amer 40 (*)    GFR calc Af Amer 46 (*)    All other components within normal limits  CBC WITH DIFFERENTIAL/PLATELET - Abnormal; Notable for the following components:   RBC 3.80 (*)    HCT 34.6 (*)    RDW 11.2 (*)    Neutro Abs 8.1 (*)    All other components within normal limits  URINE CULTURE  CULTURE, BLOOD (ROUTINE X 2)  CULTURE, BLOOD (ROUTINE X 2)  URINALYSIS, ROUTINE W REFLEX MICROSCOPIC  TROPONIN I  I-STAT CG4 LACTIC ACID, ED    EKG EKG Interpretation  Date/Time:  Saturday December 25 2017 16:56:18 EDT Ventricular Rate:  70 PR Interval:    QRS Duration: 88 QT Interval:  442 QTC Calculation: 477 R Axis:   160 Text Interpretation:  Right and left arm electrode reversal, interpretation assumes no reversal Sinus rhythm Right axis deviation Abnormal T, consider ischemia, lateral leads Baseline wander in lead(s) V1 No significant change since last tracing Confirmed by Isla Pence [phone]) on 12/25/2017 5:07:40 PM   Radiology Dg Chest 2 View  Result Date: 12/25/2017 CLINICAL DATA:  Low back pain after fall last week. Productive cough. EXAM: CHEST - 2 VIEW COMPARISON:  July 11, 2007 FINDINGS: The heart, hila, and mediastinum are normal. No pneumothorax. No  nodules or masses. No focal infiltrates. Severe degenerative changes in the left shoulder. Right shoulder replacement. No other acute abnormalities identified. IMPRESSION: No active cardiopulmonary disease. Electronically Signed   By: Dorise Bullion III M.D   On: 12/25/2017 17:01   Dg Lumbar Spine Complete  Result Date: 12/25/2017 CLINICAL DATA:  Pain after fall last week EXAM: LUMBAR SPINE - COMPLETE 4+ VIEW COMPARISON:  Sep 17, 2016 FINDINGS: Scoliotic curvature lumbar spine, apex to the left. Severe degenerative changes most marked at T12-L1, L1-L2, and L2-L3. No acute fracture noted. Lower lumbar facet degenerative changes. No significant malalignment otherwise seen. IMPRESSION: Scoliotic and degenerative changes in the spine. No acute fracture noted. Electronically Signed   By: Dorise Bullion III M.D   On: 12/25/2017 17:04    Procedures Procedures (including critical care time)  Medications Ordered in ED Medications  sodium chloride 0.9 % bolus 1,000 mL ( Intravenous Stopped 12/25/17 1808)     Initial Impression / Assessment and Plan / ED Course  I have reviewed the triage vital signs and the nursing notes.  Pertinent labs & imaging results that were available during my care of the patient were reviewed by me and considered in my medical decision making (see chart for details).    Pt is feeling better after IVFs.  She was able to ambulate with a walker.  She is on a diuretic, so I will stop that.  Pt encouraged to f/u with pcp for a repeat sodium.  Return if worse.  Final Clinical Impressions(s) / ED Diagnoses   Final diagnoses:  Hyponatremia  Viral upper respiratory tract infection    ED Discharge Orders    None       Isla Pence, MD 12/25/17 1930

## 2017-12-25 NOTE — Discharge Instructions (Signed)
Hold the triamterene-HCTZ for now until you see your doctor.

## 2017-12-25 NOTE — ED Notes (Signed)
Resting sleeping. Arousable to voice. Alert, NAD, calm, interactive, resps e/u, speaking in clear complete sentences, no dyspnea noted. Family at [facility]. Updated, VSS.

## 2017-12-25 NOTE — ED Triage Notes (Signed)
[facility]: pt from home. Has URI and UTI x 1 week with cipro treatment. Reports progressive URI with productive cough. Also hx of acid reflux.  VSS.

## 2017-12-25 NOTE — ED Notes (Signed)
Pt on cardiac monitor and auto VS 

## 2017-12-27 LAB — URINE CULTURE: Culture: <10000 — AB

## 2017-12-31 LAB — CULTURE, BLOOD (ROUTINE X 2)
CULTURE: NO GROWTH
Culture: NO GROWTH
SPECIAL REQUESTS: ADEQUATE
Special Requests: ADEQUATE

## 2018-01-03 DIAGNOSIS — E871 Hypo-osmolality and hyponatremia: Secondary | ICD-10-CM | POA: Diagnosis not present

## 2018-01-03 DIAGNOSIS — N183 Chronic kidney disease, stage 3 (moderate): Secondary | ICD-10-CM | POA: Diagnosis not present

## 2018-01-03 DIAGNOSIS — L89300 Pressure ulcer of unspecified buttock, unstageable: Secondary | ICD-10-CM | POA: Diagnosis not present

## 2018-01-03 DIAGNOSIS — Z79899 Other long term (current) drug therapy: Secondary | ICD-10-CM | POA: Diagnosis not present

## 2018-01-03 DIAGNOSIS — I129 Hypertensive chronic kidney disease with stage 1 through stage 4 chronic kidney disease, or unspecified chronic kidney disease: Secondary | ICD-10-CM | POA: Diagnosis not present

## 2018-01-03 DIAGNOSIS — R5381 Other malaise: Secondary | ICD-10-CM | POA: Diagnosis not present

## 2018-01-03 DIAGNOSIS — R531 Weakness: Secondary | ICD-10-CM | POA: Diagnosis not present

## 2018-01-21 DIAGNOSIS — M5137 Other intervertebral disc degeneration, lumbosacral region: Secondary | ICD-10-CM | POA: Diagnosis not present

## 2018-01-24 DIAGNOSIS — I493 Ventricular premature depolarization: Secondary | ICD-10-CM | POA: Diagnosis not present

## 2018-01-24 DIAGNOSIS — I951 Orthostatic hypotension: Secondary | ICD-10-CM | POA: Diagnosis not present

## 2018-01-24 DIAGNOSIS — R001 Bradycardia, unspecified: Secondary | ICD-10-CM | POA: Diagnosis not present

## 2018-01-24 DIAGNOSIS — I10 Essential (primary) hypertension: Secondary | ICD-10-CM | POA: Diagnosis not present

## 2018-02-02 DIAGNOSIS — R159 Full incontinence of feces: Secondary | ICD-10-CM | POA: Diagnosis not present

## 2018-02-02 DIAGNOSIS — R310 Gross hematuria: Secondary | ICD-10-CM | POA: Diagnosis not present

## 2018-02-02 DIAGNOSIS — R490 Dysphonia: Secondary | ICD-10-CM | POA: Diagnosis not present

## 2018-02-02 DIAGNOSIS — Z23 Encounter for immunization: Secondary | ICD-10-CM | POA: Diagnosis not present

## 2018-02-08 DIAGNOSIS — M6281 Muscle weakness (generalized): Secondary | ICD-10-CM | POA: Diagnosis not present

## 2018-02-08 DIAGNOSIS — R2681 Unsteadiness on feet: Secondary | ICD-10-CM | POA: Diagnosis not present

## 2018-02-09 DIAGNOSIS — M6281 Muscle weakness (generalized): Secondary | ICD-10-CM | POA: Diagnosis not present

## 2018-02-09 DIAGNOSIS — R2681 Unsteadiness on feet: Secondary | ICD-10-CM | POA: Diagnosis not present

## 2018-02-10 DIAGNOSIS — M6281 Muscle weakness (generalized): Secondary | ICD-10-CM | POA: Diagnosis not present

## 2018-02-10 DIAGNOSIS — R2681 Unsteadiness on feet: Secondary | ICD-10-CM | POA: Diagnosis not present

## 2018-02-14 DIAGNOSIS — M6281 Muscle weakness (generalized): Secondary | ICD-10-CM | POA: Diagnosis not present

## 2018-02-14 DIAGNOSIS — R2681 Unsteadiness on feet: Secondary | ICD-10-CM | POA: Diagnosis not present

## 2018-02-15 DIAGNOSIS — M6281 Muscle weakness (generalized): Secondary | ICD-10-CM | POA: Diagnosis not present

## 2018-02-15 DIAGNOSIS — R2681 Unsteadiness on feet: Secondary | ICD-10-CM | POA: Diagnosis not present

## 2018-02-16 DIAGNOSIS — R2681 Unsteadiness on feet: Secondary | ICD-10-CM | POA: Diagnosis not present

## 2018-02-16 DIAGNOSIS — M6281 Muscle weakness (generalized): Secondary | ICD-10-CM | POA: Diagnosis not present

## 2018-02-18 DIAGNOSIS — R2681 Unsteadiness on feet: Secondary | ICD-10-CM | POA: Diagnosis not present

## 2018-02-18 DIAGNOSIS — M6281 Muscle weakness (generalized): Secondary | ICD-10-CM | POA: Diagnosis not present

## 2018-02-21 DIAGNOSIS — M6281 Muscle weakness (generalized): Secondary | ICD-10-CM | POA: Diagnosis not present

## 2018-02-21 DIAGNOSIS — R2681 Unsteadiness on feet: Secondary | ICD-10-CM | POA: Diagnosis not present

## 2018-02-22 DIAGNOSIS — R2681 Unsteadiness on feet: Secondary | ICD-10-CM | POA: Diagnosis not present

## 2018-02-22 DIAGNOSIS — M6281 Muscle weakness (generalized): Secondary | ICD-10-CM | POA: Diagnosis not present

## 2018-02-23 DIAGNOSIS — R2681 Unsteadiness on feet: Secondary | ICD-10-CM | POA: Diagnosis not present

## 2018-02-23 DIAGNOSIS — M6281 Muscle weakness (generalized): Secondary | ICD-10-CM | POA: Diagnosis not present

## 2018-02-24 DIAGNOSIS — I10 Essential (primary) hypertension: Secondary | ICD-10-CM | POA: Diagnosis not present

## 2018-02-24 DIAGNOSIS — M6281 Muscle weakness (generalized): Secondary | ICD-10-CM | POA: Diagnosis not present

## 2018-02-24 DIAGNOSIS — R2681 Unsteadiness on feet: Secondary | ICD-10-CM | POA: Diagnosis not present

## 2018-02-24 DIAGNOSIS — R0989 Other specified symptoms and signs involving the circulatory and respiratory systems: Secondary | ICD-10-CM | POA: Diagnosis not present

## 2018-02-25 DIAGNOSIS — M6281 Muscle weakness (generalized): Secondary | ICD-10-CM | POA: Diagnosis not present

## 2018-02-25 DIAGNOSIS — R2681 Unsteadiness on feet: Secondary | ICD-10-CM | POA: Diagnosis not present

## 2018-02-28 DIAGNOSIS — R2681 Unsteadiness on feet: Secondary | ICD-10-CM | POA: Diagnosis not present

## 2018-02-28 DIAGNOSIS — M6281 Muscle weakness (generalized): Secondary | ICD-10-CM | POA: Diagnosis not present

## 2018-03-01 DIAGNOSIS — C441121 Basal cell carcinoma of skin of right upper eyelid, including canthus: Secondary | ICD-10-CM | POA: Diagnosis not present

## 2018-03-03 DIAGNOSIS — M6281 Muscle weakness (generalized): Secondary | ICD-10-CM | POA: Diagnosis not present

## 2018-03-03 DIAGNOSIS — R2681 Unsteadiness on feet: Secondary | ICD-10-CM | POA: Diagnosis not present

## 2018-03-04 DIAGNOSIS — M6281 Muscle weakness (generalized): Secondary | ICD-10-CM | POA: Diagnosis not present

## 2018-03-04 DIAGNOSIS — R2681 Unsteadiness on feet: Secondary | ICD-10-CM | POA: Diagnosis not present

## 2018-03-07 DIAGNOSIS — R2681 Unsteadiness on feet: Secondary | ICD-10-CM | POA: Diagnosis not present

## 2018-03-07 DIAGNOSIS — M6281 Muscle weakness (generalized): Secondary | ICD-10-CM | POA: Diagnosis not present

## 2018-03-08 DIAGNOSIS — R2681 Unsteadiness on feet: Secondary | ICD-10-CM | POA: Diagnosis not present

## 2018-03-08 DIAGNOSIS — M6281 Muscle weakness (generalized): Secondary | ICD-10-CM | POA: Diagnosis not present

## 2018-03-09 DIAGNOSIS — R2681 Unsteadiness on feet: Secondary | ICD-10-CM | POA: Diagnosis not present

## 2018-03-09 DIAGNOSIS — M6281 Muscle weakness (generalized): Secondary | ICD-10-CM | POA: Diagnosis not present

## 2018-03-10 DIAGNOSIS — M6281 Muscle weakness (generalized): Secondary | ICD-10-CM | POA: Diagnosis not present

## 2018-03-10 DIAGNOSIS — R2681 Unsteadiness on feet: Secondary | ICD-10-CM | POA: Diagnosis not present

## 2018-03-11 DIAGNOSIS — M6281 Muscle weakness (generalized): Secondary | ICD-10-CM | POA: Diagnosis not present

## 2018-03-11 DIAGNOSIS — R2681 Unsteadiness on feet: Secondary | ICD-10-CM | POA: Diagnosis not present

## 2018-03-14 DIAGNOSIS — M6281 Muscle weakness (generalized): Secondary | ICD-10-CM | POA: Diagnosis not present

## 2018-03-14 DIAGNOSIS — R2681 Unsteadiness on feet: Secondary | ICD-10-CM | POA: Diagnosis not present

## 2018-03-15 DIAGNOSIS — R2681 Unsteadiness on feet: Secondary | ICD-10-CM | POA: Diagnosis not present

## 2018-03-15 DIAGNOSIS — M6281 Muscle weakness (generalized): Secondary | ICD-10-CM | POA: Diagnosis not present

## 2018-03-16 DIAGNOSIS — M6281 Muscle weakness (generalized): Secondary | ICD-10-CM | POA: Diagnosis not present

## 2018-03-16 DIAGNOSIS — R2681 Unsteadiness on feet: Secondary | ICD-10-CM | POA: Diagnosis not present

## 2018-03-17 DIAGNOSIS — R2681 Unsteadiness on feet: Secondary | ICD-10-CM | POA: Diagnosis not present

## 2018-03-17 DIAGNOSIS — M6281 Muscle weakness (generalized): Secondary | ICD-10-CM | POA: Diagnosis not present

## 2018-03-18 DIAGNOSIS — L89322 Pressure ulcer of left buttock, stage 2: Secondary | ICD-10-CM | POA: Diagnosis not present

## 2018-03-18 DIAGNOSIS — Z9181 History of falling: Secondary | ICD-10-CM | POA: Diagnosis not present

## 2018-03-18 DIAGNOSIS — Z7982 Long term (current) use of aspirin: Secondary | ICD-10-CM | POA: Diagnosis not present

## 2018-03-18 DIAGNOSIS — L89623 Pressure ulcer of left heel, stage 3: Secondary | ICD-10-CM | POA: Diagnosis not present

## 2018-03-18 DIAGNOSIS — E785 Hyperlipidemia, unspecified: Secondary | ICD-10-CM | POA: Diagnosis not present

## 2018-03-18 DIAGNOSIS — H903 Sensorineural hearing loss, bilateral: Secondary | ICD-10-CM | POA: Diagnosis not present

## 2018-03-18 DIAGNOSIS — M419 Scoliosis, unspecified: Secondary | ICD-10-CM | POA: Diagnosis not present

## 2018-03-18 DIAGNOSIS — Z79899 Other long term (current) drug therapy: Secondary | ICD-10-CM | POA: Diagnosis not present

## 2018-03-18 DIAGNOSIS — M1990 Unspecified osteoarthritis, unspecified site: Secondary | ICD-10-CM | POA: Diagnosis not present

## 2018-03-18 DIAGNOSIS — I739 Peripheral vascular disease, unspecified: Secondary | ICD-10-CM | POA: Diagnosis not present

## 2018-03-18 DIAGNOSIS — I129 Hypertensive chronic kidney disease with stage 1 through stage 4 chronic kidney disease, or unspecified chronic kidney disease: Secondary | ICD-10-CM | POA: Diagnosis not present

## 2018-03-18 DIAGNOSIS — N183 Chronic kidney disease, stage 3 (moderate): Secondary | ICD-10-CM | POA: Diagnosis not present

## 2018-03-22 DIAGNOSIS — N183 Chronic kidney disease, stage 3 (moderate): Secondary | ICD-10-CM | POA: Diagnosis not present

## 2018-03-22 DIAGNOSIS — L89322 Pressure ulcer of left buttock, stage 2: Secondary | ICD-10-CM | POA: Diagnosis not present

## 2018-03-22 DIAGNOSIS — M419 Scoliosis, unspecified: Secondary | ICD-10-CM | POA: Diagnosis not present

## 2018-03-22 DIAGNOSIS — L89623 Pressure ulcer of left heel, stage 3: Secondary | ICD-10-CM | POA: Diagnosis not present

## 2018-03-22 DIAGNOSIS — I739 Peripheral vascular disease, unspecified: Secondary | ICD-10-CM | POA: Diagnosis not present

## 2018-03-22 DIAGNOSIS — I129 Hypertensive chronic kidney disease with stage 1 through stage 4 chronic kidney disease, or unspecified chronic kidney disease: Secondary | ICD-10-CM | POA: Diagnosis not present

## 2018-03-25 DIAGNOSIS — L89623 Pressure ulcer of left heel, stage 3: Secondary | ICD-10-CM | POA: Diagnosis not present

## 2018-03-25 DIAGNOSIS — L89322 Pressure ulcer of left buttock, stage 2: Secondary | ICD-10-CM | POA: Diagnosis not present

## 2018-03-25 DIAGNOSIS — I129 Hypertensive chronic kidney disease with stage 1 through stage 4 chronic kidney disease, or unspecified chronic kidney disease: Secondary | ICD-10-CM | POA: Diagnosis not present

## 2018-03-25 DIAGNOSIS — I739 Peripheral vascular disease, unspecified: Secondary | ICD-10-CM | POA: Diagnosis not present

## 2018-03-25 DIAGNOSIS — M419 Scoliosis, unspecified: Secondary | ICD-10-CM | POA: Diagnosis not present

## 2018-03-25 DIAGNOSIS — N183 Chronic kidney disease, stage 3 (moderate): Secondary | ICD-10-CM | POA: Diagnosis not present

## 2018-03-28 DIAGNOSIS — I739 Peripheral vascular disease, unspecified: Secondary | ICD-10-CM | POA: Diagnosis not present

## 2018-03-28 DIAGNOSIS — L89322 Pressure ulcer of left buttock, stage 2: Secondary | ICD-10-CM | POA: Diagnosis not present

## 2018-03-28 DIAGNOSIS — L89623 Pressure ulcer of left heel, stage 3: Secondary | ICD-10-CM | POA: Diagnosis not present

## 2018-03-28 DIAGNOSIS — I129 Hypertensive chronic kidney disease with stage 1 through stage 4 chronic kidney disease, or unspecified chronic kidney disease: Secondary | ICD-10-CM | POA: Diagnosis not present

## 2018-03-28 DIAGNOSIS — M419 Scoliosis, unspecified: Secondary | ICD-10-CM | POA: Diagnosis not present

## 2018-03-28 DIAGNOSIS — N183 Chronic kidney disease, stage 3 (moderate): Secondary | ICD-10-CM | POA: Diagnosis not present

## 2018-03-30 DIAGNOSIS — L89623 Pressure ulcer of left heel, stage 3: Secondary | ICD-10-CM | POA: Diagnosis not present

## 2018-03-30 DIAGNOSIS — M419 Scoliosis, unspecified: Secondary | ICD-10-CM | POA: Diagnosis not present

## 2018-03-30 DIAGNOSIS — I129 Hypertensive chronic kidney disease with stage 1 through stage 4 chronic kidney disease, or unspecified chronic kidney disease: Secondary | ICD-10-CM | POA: Diagnosis not present

## 2018-03-30 DIAGNOSIS — I739 Peripheral vascular disease, unspecified: Secondary | ICD-10-CM | POA: Diagnosis not present

## 2018-03-30 DIAGNOSIS — L89322 Pressure ulcer of left buttock, stage 2: Secondary | ICD-10-CM | POA: Diagnosis not present

## 2018-03-30 DIAGNOSIS — N183 Chronic kidney disease, stage 3 (moderate): Secondary | ICD-10-CM | POA: Diagnosis not present

## 2018-03-31 DIAGNOSIS — L89623 Pressure ulcer of left heel, stage 3: Secondary | ICD-10-CM | POA: Diagnosis not present

## 2018-03-31 DIAGNOSIS — M419 Scoliosis, unspecified: Secondary | ICD-10-CM | POA: Diagnosis not present

## 2018-03-31 DIAGNOSIS — L89322 Pressure ulcer of left buttock, stage 2: Secondary | ICD-10-CM | POA: Diagnosis not present

## 2018-03-31 DIAGNOSIS — I129 Hypertensive chronic kidney disease with stage 1 through stage 4 chronic kidney disease, or unspecified chronic kidney disease: Secondary | ICD-10-CM | POA: Diagnosis not present

## 2018-03-31 DIAGNOSIS — N183 Chronic kidney disease, stage 3 (moderate): Secondary | ICD-10-CM | POA: Diagnosis not present

## 2018-03-31 DIAGNOSIS — I739 Peripheral vascular disease, unspecified: Secondary | ICD-10-CM | POA: Diagnosis not present

## 2018-04-04 DIAGNOSIS — L89322 Pressure ulcer of left buttock, stage 2: Secondary | ICD-10-CM | POA: Diagnosis not present

## 2018-04-04 DIAGNOSIS — L89623 Pressure ulcer of left heel, stage 3: Secondary | ICD-10-CM | POA: Diagnosis not present

## 2018-04-04 DIAGNOSIS — M419 Scoliosis, unspecified: Secondary | ICD-10-CM | POA: Diagnosis not present

## 2018-04-04 DIAGNOSIS — I739 Peripheral vascular disease, unspecified: Secondary | ICD-10-CM | POA: Diagnosis not present

## 2018-04-04 DIAGNOSIS — N183 Chronic kidney disease, stage 3 (moderate): Secondary | ICD-10-CM | POA: Diagnosis not present

## 2018-04-04 DIAGNOSIS — I129 Hypertensive chronic kidney disease with stage 1 through stage 4 chronic kidney disease, or unspecified chronic kidney disease: Secondary | ICD-10-CM | POA: Diagnosis not present

## 2018-04-05 DIAGNOSIS — L89623 Pressure ulcer of left heel, stage 3: Secondary | ICD-10-CM | POA: Diagnosis not present

## 2018-04-05 DIAGNOSIS — M419 Scoliosis, unspecified: Secondary | ICD-10-CM | POA: Diagnosis not present

## 2018-04-05 DIAGNOSIS — L89322 Pressure ulcer of left buttock, stage 2: Secondary | ICD-10-CM | POA: Diagnosis not present

## 2018-04-05 DIAGNOSIS — I739 Peripheral vascular disease, unspecified: Secondary | ICD-10-CM | POA: Diagnosis not present

## 2018-04-05 DIAGNOSIS — N183 Chronic kidney disease, stage 3 (moderate): Secondary | ICD-10-CM | POA: Diagnosis not present

## 2018-04-05 DIAGNOSIS — I129 Hypertensive chronic kidney disease with stage 1 through stage 4 chronic kidney disease, or unspecified chronic kidney disease: Secondary | ICD-10-CM | POA: Diagnosis not present

## 2018-04-07 DIAGNOSIS — I129 Hypertensive chronic kidney disease with stage 1 through stage 4 chronic kidney disease, or unspecified chronic kidney disease: Secondary | ICD-10-CM | POA: Diagnosis not present

## 2018-04-07 DIAGNOSIS — I739 Peripheral vascular disease, unspecified: Secondary | ICD-10-CM | POA: Diagnosis not present

## 2018-04-07 DIAGNOSIS — N183 Chronic kidney disease, stage 3 (moderate): Secondary | ICD-10-CM | POA: Diagnosis not present

## 2018-04-07 DIAGNOSIS — M419 Scoliosis, unspecified: Secondary | ICD-10-CM | POA: Diagnosis not present

## 2018-04-07 DIAGNOSIS — L89623 Pressure ulcer of left heel, stage 3: Secondary | ICD-10-CM | POA: Diagnosis not present

## 2018-04-07 DIAGNOSIS — L89322 Pressure ulcer of left buttock, stage 2: Secondary | ICD-10-CM | POA: Diagnosis not present

## 2018-04-08 DIAGNOSIS — M419 Scoliosis, unspecified: Secondary | ICD-10-CM | POA: Diagnosis not present

## 2018-04-08 DIAGNOSIS — L89623 Pressure ulcer of left heel, stage 3: Secondary | ICD-10-CM | POA: Diagnosis not present

## 2018-04-08 DIAGNOSIS — L89322 Pressure ulcer of left buttock, stage 2: Secondary | ICD-10-CM | POA: Diagnosis not present

## 2018-04-08 DIAGNOSIS — I129 Hypertensive chronic kidney disease with stage 1 through stage 4 chronic kidney disease, or unspecified chronic kidney disease: Secondary | ICD-10-CM | POA: Diagnosis not present

## 2018-04-08 DIAGNOSIS — N183 Chronic kidney disease, stage 3 (moderate): Secondary | ICD-10-CM | POA: Diagnosis not present

## 2018-04-08 DIAGNOSIS — I739 Peripheral vascular disease, unspecified: Secondary | ICD-10-CM | POA: Diagnosis not present

## 2018-04-12 DIAGNOSIS — M419 Scoliosis, unspecified: Secondary | ICD-10-CM | POA: Diagnosis not present

## 2018-04-12 DIAGNOSIS — L89322 Pressure ulcer of left buttock, stage 2: Secondary | ICD-10-CM | POA: Diagnosis not present

## 2018-04-12 DIAGNOSIS — N183 Chronic kidney disease, stage 3 (moderate): Secondary | ICD-10-CM | POA: Diagnosis not present

## 2018-04-12 DIAGNOSIS — L89623 Pressure ulcer of left heel, stage 3: Secondary | ICD-10-CM | POA: Diagnosis not present

## 2018-04-12 DIAGNOSIS — I739 Peripheral vascular disease, unspecified: Secondary | ICD-10-CM | POA: Diagnosis not present

## 2018-04-12 DIAGNOSIS — I129 Hypertensive chronic kidney disease with stage 1 through stage 4 chronic kidney disease, or unspecified chronic kidney disease: Secondary | ICD-10-CM | POA: Diagnosis not present

## 2018-04-13 DIAGNOSIS — M419 Scoliosis, unspecified: Secondary | ICD-10-CM | POA: Diagnosis not present

## 2018-04-13 DIAGNOSIS — L89623 Pressure ulcer of left heel, stage 3: Secondary | ICD-10-CM | POA: Diagnosis not present

## 2018-04-13 DIAGNOSIS — L89322 Pressure ulcer of left buttock, stage 2: Secondary | ICD-10-CM | POA: Diagnosis not present

## 2018-04-13 DIAGNOSIS — I739 Peripheral vascular disease, unspecified: Secondary | ICD-10-CM | POA: Diagnosis not present

## 2018-04-13 DIAGNOSIS — I129 Hypertensive chronic kidney disease with stage 1 through stage 4 chronic kidney disease, or unspecified chronic kidney disease: Secondary | ICD-10-CM | POA: Diagnosis not present

## 2018-04-13 DIAGNOSIS — N183 Chronic kidney disease, stage 3 (moderate): Secondary | ICD-10-CM | POA: Diagnosis not present

## 2018-04-15 DIAGNOSIS — L89322 Pressure ulcer of left buttock, stage 2: Secondary | ICD-10-CM | POA: Diagnosis not present

## 2018-04-15 DIAGNOSIS — L89623 Pressure ulcer of left heel, stage 3: Secondary | ICD-10-CM | POA: Diagnosis not present

## 2018-04-15 DIAGNOSIS — N183 Chronic kidney disease, stage 3 (moderate): Secondary | ICD-10-CM | POA: Diagnosis not present

## 2018-04-15 DIAGNOSIS — I129 Hypertensive chronic kidney disease with stage 1 through stage 4 chronic kidney disease, or unspecified chronic kidney disease: Secondary | ICD-10-CM | POA: Diagnosis not present

## 2018-04-15 DIAGNOSIS — M419 Scoliosis, unspecified: Secondary | ICD-10-CM | POA: Diagnosis not present

## 2018-04-15 DIAGNOSIS — I739 Peripheral vascular disease, unspecified: Secondary | ICD-10-CM | POA: Diagnosis not present

## 2018-04-18 DIAGNOSIS — L89322 Pressure ulcer of left buttock, stage 2: Secondary | ICD-10-CM | POA: Diagnosis not present

## 2018-04-18 DIAGNOSIS — M419 Scoliosis, unspecified: Secondary | ICD-10-CM | POA: Diagnosis not present

## 2018-04-18 DIAGNOSIS — N183 Chronic kidney disease, stage 3 (moderate): Secondary | ICD-10-CM | POA: Diagnosis not present

## 2018-04-18 DIAGNOSIS — I739 Peripheral vascular disease, unspecified: Secondary | ICD-10-CM | POA: Diagnosis not present

## 2018-04-18 DIAGNOSIS — I129 Hypertensive chronic kidney disease with stage 1 through stage 4 chronic kidney disease, or unspecified chronic kidney disease: Secondary | ICD-10-CM | POA: Diagnosis not present

## 2018-04-18 DIAGNOSIS — L89623 Pressure ulcer of left heel, stage 3: Secondary | ICD-10-CM | POA: Diagnosis not present

## 2018-04-21 DIAGNOSIS — I739 Peripheral vascular disease, unspecified: Secondary | ICD-10-CM | POA: Diagnosis not present

## 2018-04-21 DIAGNOSIS — L89623 Pressure ulcer of left heel, stage 3: Secondary | ICD-10-CM | POA: Diagnosis not present

## 2018-04-21 DIAGNOSIS — I129 Hypertensive chronic kidney disease with stage 1 through stage 4 chronic kidney disease, or unspecified chronic kidney disease: Secondary | ICD-10-CM | POA: Diagnosis not present

## 2018-04-21 DIAGNOSIS — M419 Scoliosis, unspecified: Secondary | ICD-10-CM | POA: Diagnosis not present

## 2018-04-21 DIAGNOSIS — N183 Chronic kidney disease, stage 3 (moderate): Secondary | ICD-10-CM | POA: Diagnosis not present

## 2018-04-21 DIAGNOSIS — L89322 Pressure ulcer of left buttock, stage 2: Secondary | ICD-10-CM | POA: Diagnosis not present

## 2018-04-25 DIAGNOSIS — N183 Chronic kidney disease, stage 3 (moderate): Secondary | ICD-10-CM | POA: Diagnosis not present

## 2018-04-25 DIAGNOSIS — I739 Peripheral vascular disease, unspecified: Secondary | ICD-10-CM | POA: Diagnosis not present

## 2018-04-25 DIAGNOSIS — L89322 Pressure ulcer of left buttock, stage 2: Secondary | ICD-10-CM | POA: Diagnosis not present

## 2018-04-25 DIAGNOSIS — L89623 Pressure ulcer of left heel, stage 3: Secondary | ICD-10-CM | POA: Diagnosis not present

## 2018-04-25 DIAGNOSIS — M419 Scoliosis, unspecified: Secondary | ICD-10-CM | POA: Diagnosis not present

## 2018-04-25 DIAGNOSIS — I129 Hypertensive chronic kidney disease with stage 1 through stage 4 chronic kidney disease, or unspecified chronic kidney disease: Secondary | ICD-10-CM | POA: Diagnosis not present

## 2018-04-26 DIAGNOSIS — L89623 Pressure ulcer of left heel, stage 3: Secondary | ICD-10-CM | POA: Diagnosis not present

## 2018-04-26 DIAGNOSIS — I129 Hypertensive chronic kidney disease with stage 1 through stage 4 chronic kidney disease, or unspecified chronic kidney disease: Secondary | ICD-10-CM | POA: Diagnosis not present

## 2018-04-26 DIAGNOSIS — L89322 Pressure ulcer of left buttock, stage 2: Secondary | ICD-10-CM | POA: Diagnosis not present

## 2018-04-26 DIAGNOSIS — I739 Peripheral vascular disease, unspecified: Secondary | ICD-10-CM | POA: Diagnosis not present

## 2018-04-26 DIAGNOSIS — M419 Scoliosis, unspecified: Secondary | ICD-10-CM | POA: Diagnosis not present

## 2018-04-26 DIAGNOSIS — N183 Chronic kidney disease, stage 3 (moderate): Secondary | ICD-10-CM | POA: Diagnosis not present

## 2018-04-28 DIAGNOSIS — I739 Peripheral vascular disease, unspecified: Secondary | ICD-10-CM | POA: Diagnosis not present

## 2018-04-28 DIAGNOSIS — L89322 Pressure ulcer of left buttock, stage 2: Secondary | ICD-10-CM | POA: Diagnosis not present

## 2018-04-28 DIAGNOSIS — I129 Hypertensive chronic kidney disease with stage 1 through stage 4 chronic kidney disease, or unspecified chronic kidney disease: Secondary | ICD-10-CM | POA: Diagnosis not present

## 2018-04-28 DIAGNOSIS — N183 Chronic kidney disease, stage 3 (moderate): Secondary | ICD-10-CM | POA: Diagnosis not present

## 2018-04-28 DIAGNOSIS — L89623 Pressure ulcer of left heel, stage 3: Secondary | ICD-10-CM | POA: Diagnosis not present

## 2018-04-28 DIAGNOSIS — M419 Scoliosis, unspecified: Secondary | ICD-10-CM | POA: Diagnosis not present

## 2018-05-02 DIAGNOSIS — M419 Scoliosis, unspecified: Secondary | ICD-10-CM | POA: Diagnosis not present

## 2018-05-02 DIAGNOSIS — I739 Peripheral vascular disease, unspecified: Secondary | ICD-10-CM | POA: Diagnosis not present

## 2018-05-02 DIAGNOSIS — L89623 Pressure ulcer of left heel, stage 3: Secondary | ICD-10-CM | POA: Diagnosis not present

## 2018-05-02 DIAGNOSIS — L89322 Pressure ulcer of left buttock, stage 2: Secondary | ICD-10-CM | POA: Diagnosis not present

## 2018-05-02 DIAGNOSIS — I129 Hypertensive chronic kidney disease with stage 1 through stage 4 chronic kidney disease, or unspecified chronic kidney disease: Secondary | ICD-10-CM | POA: Diagnosis not present

## 2018-05-02 DIAGNOSIS — N183 Chronic kidney disease, stage 3 (moderate): Secondary | ICD-10-CM | POA: Diagnosis not present

## 2018-05-03 DIAGNOSIS — L89623 Pressure ulcer of left heel, stage 3: Secondary | ICD-10-CM | POA: Diagnosis not present

## 2018-05-03 DIAGNOSIS — N183 Chronic kidney disease, stage 3 (moderate): Secondary | ICD-10-CM | POA: Diagnosis not present

## 2018-05-03 DIAGNOSIS — L89322 Pressure ulcer of left buttock, stage 2: Secondary | ICD-10-CM | POA: Diagnosis not present

## 2018-05-03 DIAGNOSIS — I739 Peripheral vascular disease, unspecified: Secondary | ICD-10-CM | POA: Diagnosis not present

## 2018-05-03 DIAGNOSIS — I129 Hypertensive chronic kidney disease with stage 1 through stage 4 chronic kidney disease, or unspecified chronic kidney disease: Secondary | ICD-10-CM | POA: Diagnosis not present

## 2018-05-03 DIAGNOSIS — M419 Scoliosis, unspecified: Secondary | ICD-10-CM | POA: Diagnosis not present

## 2018-05-04 DIAGNOSIS — N183 Chronic kidney disease, stage 3 (moderate): Secondary | ICD-10-CM | POA: Diagnosis not present

## 2018-05-04 DIAGNOSIS — R413 Other amnesia: Secondary | ICD-10-CM | POA: Diagnosis not present

## 2018-05-04 DIAGNOSIS — R269 Unspecified abnormalities of gait and mobility: Secondary | ICD-10-CM | POA: Diagnosis not present

## 2018-05-04 DIAGNOSIS — I129 Hypertensive chronic kidney disease with stage 1 through stage 4 chronic kidney disease, or unspecified chronic kidney disease: Secondary | ICD-10-CM | POA: Diagnosis not present

## 2018-05-04 DIAGNOSIS — Z79899 Other long term (current) drug therapy: Secondary | ICD-10-CM | POA: Diagnosis not present

## 2018-05-05 DIAGNOSIS — L89322 Pressure ulcer of left buttock, stage 2: Secondary | ICD-10-CM | POA: Diagnosis not present

## 2018-05-05 DIAGNOSIS — I129 Hypertensive chronic kidney disease with stage 1 through stage 4 chronic kidney disease, or unspecified chronic kidney disease: Secondary | ICD-10-CM | POA: Diagnosis not present

## 2018-05-05 DIAGNOSIS — I739 Peripheral vascular disease, unspecified: Secondary | ICD-10-CM | POA: Diagnosis not present

## 2018-05-05 DIAGNOSIS — M419 Scoliosis, unspecified: Secondary | ICD-10-CM | POA: Diagnosis not present

## 2018-05-05 DIAGNOSIS — N183 Chronic kidney disease, stage 3 (moderate): Secondary | ICD-10-CM | POA: Diagnosis not present

## 2018-05-05 DIAGNOSIS — L89623 Pressure ulcer of left heel, stage 3: Secondary | ICD-10-CM | POA: Diagnosis not present

## 2018-05-06 DIAGNOSIS — L89623 Pressure ulcer of left heel, stage 3: Secondary | ICD-10-CM | POA: Diagnosis not present

## 2018-05-06 DIAGNOSIS — N183 Chronic kidney disease, stage 3 (moderate): Secondary | ICD-10-CM | POA: Diagnosis not present

## 2018-05-06 DIAGNOSIS — I129 Hypertensive chronic kidney disease with stage 1 through stage 4 chronic kidney disease, or unspecified chronic kidney disease: Secondary | ICD-10-CM | POA: Diagnosis not present

## 2018-05-06 DIAGNOSIS — I739 Peripheral vascular disease, unspecified: Secondary | ICD-10-CM | POA: Diagnosis not present

## 2018-05-06 DIAGNOSIS — M419 Scoliosis, unspecified: Secondary | ICD-10-CM | POA: Diagnosis not present

## 2018-05-06 DIAGNOSIS — L89322 Pressure ulcer of left buttock, stage 2: Secondary | ICD-10-CM | POA: Diagnosis not present

## 2018-05-09 DIAGNOSIS — N183 Chronic kidney disease, stage 3 (moderate): Secondary | ICD-10-CM | POA: Diagnosis not present

## 2018-05-09 DIAGNOSIS — M419 Scoliosis, unspecified: Secondary | ICD-10-CM | POA: Diagnosis not present

## 2018-05-09 DIAGNOSIS — L89322 Pressure ulcer of left buttock, stage 2: Secondary | ICD-10-CM | POA: Diagnosis not present

## 2018-05-09 DIAGNOSIS — I739 Peripheral vascular disease, unspecified: Secondary | ICD-10-CM | POA: Diagnosis not present

## 2018-05-09 DIAGNOSIS — L89623 Pressure ulcer of left heel, stage 3: Secondary | ICD-10-CM | POA: Diagnosis not present

## 2018-05-09 DIAGNOSIS — I129 Hypertensive chronic kidney disease with stage 1 through stage 4 chronic kidney disease, or unspecified chronic kidney disease: Secondary | ICD-10-CM | POA: Diagnosis not present

## 2018-05-12 DIAGNOSIS — L89322 Pressure ulcer of left buttock, stage 2: Secondary | ICD-10-CM | POA: Diagnosis not present

## 2018-05-12 DIAGNOSIS — I129 Hypertensive chronic kidney disease with stage 1 through stage 4 chronic kidney disease, or unspecified chronic kidney disease: Secondary | ICD-10-CM | POA: Diagnosis not present

## 2018-05-12 DIAGNOSIS — I739 Peripheral vascular disease, unspecified: Secondary | ICD-10-CM | POA: Diagnosis not present

## 2018-05-12 DIAGNOSIS — L89623 Pressure ulcer of left heel, stage 3: Secondary | ICD-10-CM | POA: Diagnosis not present

## 2018-05-12 DIAGNOSIS — N183 Chronic kidney disease, stage 3 (moderate): Secondary | ICD-10-CM | POA: Diagnosis not present

## 2018-05-12 DIAGNOSIS — M419 Scoliosis, unspecified: Secondary | ICD-10-CM | POA: Diagnosis not present

## 2018-05-13 DIAGNOSIS — N183 Chronic kidney disease, stage 3 (moderate): Secondary | ICD-10-CM | POA: Diagnosis not present

## 2018-05-13 DIAGNOSIS — L89322 Pressure ulcer of left buttock, stage 2: Secondary | ICD-10-CM | POA: Diagnosis not present

## 2018-05-13 DIAGNOSIS — I739 Peripheral vascular disease, unspecified: Secondary | ICD-10-CM | POA: Diagnosis not present

## 2018-05-13 DIAGNOSIS — L89623 Pressure ulcer of left heel, stage 3: Secondary | ICD-10-CM | POA: Diagnosis not present

## 2018-05-13 DIAGNOSIS — I129 Hypertensive chronic kidney disease with stage 1 through stage 4 chronic kidney disease, or unspecified chronic kidney disease: Secondary | ICD-10-CM | POA: Diagnosis not present

## 2018-05-13 DIAGNOSIS — M419 Scoliosis, unspecified: Secondary | ICD-10-CM | POA: Diagnosis not present

## 2018-05-17 DIAGNOSIS — M5442 Lumbago with sciatica, left side: Secondary | ICD-10-CM | POA: Diagnosis not present

## 2018-05-17 DIAGNOSIS — Z7982 Long term (current) use of aspirin: Secondary | ICD-10-CM | POA: Diagnosis not present

## 2018-05-17 DIAGNOSIS — I129 Hypertensive chronic kidney disease with stage 1 through stage 4 chronic kidney disease, or unspecified chronic kidney disease: Secondary | ICD-10-CM | POA: Diagnosis not present

## 2018-05-17 DIAGNOSIS — M47816 Spondylosis without myelopathy or radiculopathy, lumbar region: Secondary | ICD-10-CM | POA: Diagnosis not present

## 2018-05-17 DIAGNOSIS — N183 Chronic kidney disease, stage 3 (moderate): Secondary | ICD-10-CM | POA: Diagnosis not present

## 2018-05-17 DIAGNOSIS — Z9181 History of falling: Secondary | ICD-10-CM | POA: Diagnosis not present

## 2018-05-17 DIAGNOSIS — E785 Hyperlipidemia, unspecified: Secondary | ICD-10-CM | POA: Diagnosis not present

## 2018-05-17 DIAGNOSIS — M5441 Lumbago with sciatica, right side: Secondary | ICD-10-CM | POA: Diagnosis not present

## 2018-05-17 DIAGNOSIS — M5137 Other intervertebral disc degeneration, lumbosacral region: Secondary | ICD-10-CM | POA: Diagnosis not present

## 2018-05-17 DIAGNOSIS — M5090 Cervical disc disorder, unspecified, unspecified cervical region: Secondary | ICD-10-CM | POA: Diagnosis not present

## 2018-05-17 DIAGNOSIS — I739 Peripheral vascular disease, unspecified: Secondary | ICD-10-CM | POA: Diagnosis not present

## 2018-05-17 DIAGNOSIS — M1990 Unspecified osteoarthritis, unspecified site: Secondary | ICD-10-CM | POA: Diagnosis not present

## 2018-05-17 DIAGNOSIS — H903 Sensorineural hearing loss, bilateral: Secondary | ICD-10-CM | POA: Diagnosis not present

## 2018-05-17 DIAGNOSIS — M419 Scoliosis, unspecified: Secondary | ICD-10-CM | POA: Diagnosis not present

## 2018-05-24 DIAGNOSIS — N183 Chronic kidney disease, stage 3 (moderate): Secondary | ICD-10-CM | POA: Diagnosis not present

## 2018-05-24 DIAGNOSIS — M419 Scoliosis, unspecified: Secondary | ICD-10-CM | POA: Diagnosis not present

## 2018-05-24 DIAGNOSIS — M47816 Spondylosis without myelopathy or radiculopathy, lumbar region: Secondary | ICD-10-CM | POA: Diagnosis not present

## 2018-05-24 DIAGNOSIS — I739 Peripheral vascular disease, unspecified: Secondary | ICD-10-CM | POA: Diagnosis not present

## 2018-05-24 DIAGNOSIS — M5137 Other intervertebral disc degeneration, lumbosacral region: Secondary | ICD-10-CM | POA: Diagnosis not present

## 2018-05-24 DIAGNOSIS — I129 Hypertensive chronic kidney disease with stage 1 through stage 4 chronic kidney disease, or unspecified chronic kidney disease: Secondary | ICD-10-CM | POA: Diagnosis not present

## 2018-05-26 DIAGNOSIS — M47816 Spondylosis without myelopathy or radiculopathy, lumbar region: Secondary | ICD-10-CM | POA: Diagnosis not present

## 2018-05-26 DIAGNOSIS — I739 Peripheral vascular disease, unspecified: Secondary | ICD-10-CM | POA: Diagnosis not present

## 2018-05-26 DIAGNOSIS — N183 Chronic kidney disease, stage 3 (moderate): Secondary | ICD-10-CM | POA: Diagnosis not present

## 2018-05-26 DIAGNOSIS — I129 Hypertensive chronic kidney disease with stage 1 through stage 4 chronic kidney disease, or unspecified chronic kidney disease: Secondary | ICD-10-CM | POA: Diagnosis not present

## 2018-05-26 DIAGNOSIS — M5137 Other intervertebral disc degeneration, lumbosacral region: Secondary | ICD-10-CM | POA: Diagnosis not present

## 2018-05-26 DIAGNOSIS — M419 Scoliosis, unspecified: Secondary | ICD-10-CM | POA: Diagnosis not present

## 2018-05-31 DIAGNOSIS — N183 Chronic kidney disease, stage 3 (moderate): Secondary | ICD-10-CM | POA: Diagnosis not present

## 2018-05-31 DIAGNOSIS — I739 Peripheral vascular disease, unspecified: Secondary | ICD-10-CM | POA: Diagnosis not present

## 2018-05-31 DIAGNOSIS — M419 Scoliosis, unspecified: Secondary | ICD-10-CM | POA: Diagnosis not present

## 2018-05-31 DIAGNOSIS — M5137 Other intervertebral disc degeneration, lumbosacral region: Secondary | ICD-10-CM | POA: Diagnosis not present

## 2018-05-31 DIAGNOSIS — M47816 Spondylosis without myelopathy or radiculopathy, lumbar region: Secondary | ICD-10-CM | POA: Diagnosis not present

## 2018-05-31 DIAGNOSIS — I129 Hypertensive chronic kidney disease with stage 1 through stage 4 chronic kidney disease, or unspecified chronic kidney disease: Secondary | ICD-10-CM | POA: Diagnosis not present

## 2018-06-02 DIAGNOSIS — I129 Hypertensive chronic kidney disease with stage 1 through stage 4 chronic kidney disease, or unspecified chronic kidney disease: Secondary | ICD-10-CM | POA: Diagnosis not present

## 2018-06-02 DIAGNOSIS — N183 Chronic kidney disease, stage 3 (moderate): Secondary | ICD-10-CM | POA: Diagnosis not present

## 2018-06-02 DIAGNOSIS — M419 Scoliosis, unspecified: Secondary | ICD-10-CM | POA: Diagnosis not present

## 2018-06-02 DIAGNOSIS — M5137 Other intervertebral disc degeneration, lumbosacral region: Secondary | ICD-10-CM | POA: Diagnosis not present

## 2018-06-02 DIAGNOSIS — I739 Peripheral vascular disease, unspecified: Secondary | ICD-10-CM | POA: Diagnosis not present

## 2018-06-02 DIAGNOSIS — M47816 Spondylosis without myelopathy or radiculopathy, lumbar region: Secondary | ICD-10-CM | POA: Diagnosis not present

## 2018-06-06 DIAGNOSIS — M5137 Other intervertebral disc degeneration, lumbosacral region: Secondary | ICD-10-CM | POA: Diagnosis not present

## 2018-06-06 DIAGNOSIS — I129 Hypertensive chronic kidney disease with stage 1 through stage 4 chronic kidney disease, or unspecified chronic kidney disease: Secondary | ICD-10-CM | POA: Diagnosis not present

## 2018-06-06 DIAGNOSIS — M47816 Spondylosis without myelopathy or radiculopathy, lumbar region: Secondary | ICD-10-CM | POA: Diagnosis not present

## 2018-06-06 DIAGNOSIS — N183 Chronic kidney disease, stage 3 (moderate): Secondary | ICD-10-CM | POA: Diagnosis not present

## 2018-06-06 DIAGNOSIS — I739 Peripheral vascular disease, unspecified: Secondary | ICD-10-CM | POA: Diagnosis not present

## 2018-06-06 DIAGNOSIS — M419 Scoliosis, unspecified: Secondary | ICD-10-CM | POA: Diagnosis not present

## 2018-06-08 DIAGNOSIS — M25512 Pain in left shoulder: Secondary | ICD-10-CM | POA: Diagnosis not present

## 2018-06-08 DIAGNOSIS — R499 Unspecified voice and resonance disorder: Secondary | ICD-10-CM | POA: Diagnosis not present

## 2018-06-08 DIAGNOSIS — G894 Chronic pain syndrome: Secondary | ICD-10-CM | POA: Diagnosis not present

## 2018-06-10 DIAGNOSIS — M5137 Other intervertebral disc degeneration, lumbosacral region: Secondary | ICD-10-CM | POA: Diagnosis not present

## 2018-06-10 DIAGNOSIS — M419 Scoliosis, unspecified: Secondary | ICD-10-CM | POA: Diagnosis not present

## 2018-06-10 DIAGNOSIS — I739 Peripheral vascular disease, unspecified: Secondary | ICD-10-CM | POA: Diagnosis not present

## 2018-06-10 DIAGNOSIS — I129 Hypertensive chronic kidney disease with stage 1 through stage 4 chronic kidney disease, or unspecified chronic kidney disease: Secondary | ICD-10-CM | POA: Diagnosis not present

## 2018-06-10 DIAGNOSIS — M47816 Spondylosis without myelopathy or radiculopathy, lumbar region: Secondary | ICD-10-CM | POA: Diagnosis not present

## 2018-06-10 DIAGNOSIS — N183 Chronic kidney disease, stage 3 (moderate): Secondary | ICD-10-CM | POA: Diagnosis not present

## 2018-06-14 DIAGNOSIS — M5137 Other intervertebral disc degeneration, lumbosacral region: Secondary | ICD-10-CM | POA: Diagnosis not present

## 2018-06-14 DIAGNOSIS — M419 Scoliosis, unspecified: Secondary | ICD-10-CM | POA: Diagnosis not present

## 2018-06-14 DIAGNOSIS — M47816 Spondylosis without myelopathy or radiculopathy, lumbar region: Secondary | ICD-10-CM | POA: Diagnosis not present

## 2018-06-14 DIAGNOSIS — I129 Hypertensive chronic kidney disease with stage 1 through stage 4 chronic kidney disease, or unspecified chronic kidney disease: Secondary | ICD-10-CM | POA: Diagnosis not present

## 2018-06-14 DIAGNOSIS — N183 Chronic kidney disease, stage 3 (moderate): Secondary | ICD-10-CM | POA: Diagnosis not present

## 2018-06-14 DIAGNOSIS — I739 Peripheral vascular disease, unspecified: Secondary | ICD-10-CM | POA: Diagnosis not present

## 2018-06-17 DIAGNOSIS — M47816 Spondylosis without myelopathy or radiculopathy, lumbar region: Secondary | ICD-10-CM | POA: Diagnosis not present

## 2018-06-17 DIAGNOSIS — I739 Peripheral vascular disease, unspecified: Secondary | ICD-10-CM | POA: Diagnosis not present

## 2018-06-17 DIAGNOSIS — M5137 Other intervertebral disc degeneration, lumbosacral region: Secondary | ICD-10-CM | POA: Diagnosis not present

## 2018-06-17 DIAGNOSIS — I129 Hypertensive chronic kidney disease with stage 1 through stage 4 chronic kidney disease, or unspecified chronic kidney disease: Secondary | ICD-10-CM | POA: Diagnosis not present

## 2018-06-17 DIAGNOSIS — M419 Scoliosis, unspecified: Secondary | ICD-10-CM | POA: Diagnosis not present

## 2018-06-17 DIAGNOSIS — N183 Chronic kidney disease, stage 3 (moderate): Secondary | ICD-10-CM | POA: Diagnosis not present

## 2018-06-20 DIAGNOSIS — M5137 Other intervertebral disc degeneration, lumbosacral region: Secondary | ICD-10-CM | POA: Diagnosis not present

## 2018-06-20 DIAGNOSIS — M419 Scoliosis, unspecified: Secondary | ICD-10-CM | POA: Diagnosis not present

## 2018-06-20 DIAGNOSIS — I739 Peripheral vascular disease, unspecified: Secondary | ICD-10-CM | POA: Diagnosis not present

## 2018-06-20 DIAGNOSIS — I129 Hypertensive chronic kidney disease with stage 1 through stage 4 chronic kidney disease, or unspecified chronic kidney disease: Secondary | ICD-10-CM | POA: Diagnosis not present

## 2018-06-20 DIAGNOSIS — M47816 Spondylosis without myelopathy or radiculopathy, lumbar region: Secondary | ICD-10-CM | POA: Diagnosis not present

## 2018-06-20 DIAGNOSIS — N183 Chronic kidney disease, stage 3 (moderate): Secondary | ICD-10-CM | POA: Diagnosis not present

## 2018-06-22 DIAGNOSIS — M47816 Spondylosis without myelopathy or radiculopathy, lumbar region: Secondary | ICD-10-CM | POA: Diagnosis not present

## 2018-06-22 DIAGNOSIS — M5137 Other intervertebral disc degeneration, lumbosacral region: Secondary | ICD-10-CM | POA: Diagnosis not present

## 2018-06-22 DIAGNOSIS — M419 Scoliosis, unspecified: Secondary | ICD-10-CM | POA: Diagnosis not present

## 2018-06-22 DIAGNOSIS — I129 Hypertensive chronic kidney disease with stage 1 through stage 4 chronic kidney disease, or unspecified chronic kidney disease: Secondary | ICD-10-CM | POA: Diagnosis not present

## 2018-06-22 DIAGNOSIS — N183 Chronic kidney disease, stage 3 (moderate): Secondary | ICD-10-CM | POA: Diagnosis not present

## 2018-06-22 DIAGNOSIS — I739 Peripheral vascular disease, unspecified: Secondary | ICD-10-CM | POA: Diagnosis not present

## 2018-06-24 DIAGNOSIS — M47816 Spondylosis without myelopathy or radiculopathy, lumbar region: Secondary | ICD-10-CM | POA: Diagnosis not present

## 2018-06-24 DIAGNOSIS — I739 Peripheral vascular disease, unspecified: Secondary | ICD-10-CM | POA: Diagnosis not present

## 2018-06-24 DIAGNOSIS — M419 Scoliosis, unspecified: Secondary | ICD-10-CM | POA: Diagnosis not present

## 2018-06-24 DIAGNOSIS — I129 Hypertensive chronic kidney disease with stage 1 through stage 4 chronic kidney disease, or unspecified chronic kidney disease: Secondary | ICD-10-CM | POA: Diagnosis not present

## 2018-06-24 DIAGNOSIS — M5137 Other intervertebral disc degeneration, lumbosacral region: Secondary | ICD-10-CM | POA: Diagnosis not present

## 2018-06-24 DIAGNOSIS — N183 Chronic kidney disease, stage 3 (moderate): Secondary | ICD-10-CM | POA: Diagnosis not present

## 2018-06-27 DIAGNOSIS — I739 Peripheral vascular disease, unspecified: Secondary | ICD-10-CM | POA: Diagnosis not present

## 2018-06-27 DIAGNOSIS — I129 Hypertensive chronic kidney disease with stage 1 through stage 4 chronic kidney disease, or unspecified chronic kidney disease: Secondary | ICD-10-CM | POA: Diagnosis not present

## 2018-06-27 DIAGNOSIS — M419 Scoliosis, unspecified: Secondary | ICD-10-CM | POA: Diagnosis not present

## 2018-06-27 DIAGNOSIS — M5137 Other intervertebral disc degeneration, lumbosacral region: Secondary | ICD-10-CM | POA: Diagnosis not present

## 2018-06-27 DIAGNOSIS — M47816 Spondylosis without myelopathy or radiculopathy, lumbar region: Secondary | ICD-10-CM | POA: Diagnosis not present

## 2018-06-27 DIAGNOSIS — N183 Chronic kidney disease, stage 3 (moderate): Secondary | ICD-10-CM | POA: Diagnosis not present

## 2018-06-28 DIAGNOSIS — M419 Scoliosis, unspecified: Secondary | ICD-10-CM | POA: Diagnosis not present

## 2018-06-28 DIAGNOSIS — I129 Hypertensive chronic kidney disease with stage 1 through stage 4 chronic kidney disease, or unspecified chronic kidney disease: Secondary | ICD-10-CM | POA: Diagnosis not present

## 2018-06-28 DIAGNOSIS — M5137 Other intervertebral disc degeneration, lumbosacral region: Secondary | ICD-10-CM | POA: Diagnosis not present

## 2018-06-28 DIAGNOSIS — M47816 Spondylosis without myelopathy or radiculopathy, lumbar region: Secondary | ICD-10-CM | POA: Diagnosis not present

## 2018-06-28 DIAGNOSIS — N183 Chronic kidney disease, stage 3 (moderate): Secondary | ICD-10-CM | POA: Diagnosis not present

## 2018-06-28 DIAGNOSIS — I739 Peripheral vascular disease, unspecified: Secondary | ICD-10-CM | POA: Diagnosis not present

## 2018-06-29 DIAGNOSIS — M47816 Spondylosis without myelopathy or radiculopathy, lumbar region: Secondary | ICD-10-CM | POA: Diagnosis not present

## 2018-06-29 DIAGNOSIS — N183 Chronic kidney disease, stage 3 (moderate): Secondary | ICD-10-CM | POA: Diagnosis not present

## 2018-06-29 DIAGNOSIS — M5137 Other intervertebral disc degeneration, lumbosacral region: Secondary | ICD-10-CM | POA: Diagnosis not present

## 2018-06-29 DIAGNOSIS — I129 Hypertensive chronic kidney disease with stage 1 through stage 4 chronic kidney disease, or unspecified chronic kidney disease: Secondary | ICD-10-CM | POA: Diagnosis not present

## 2018-06-29 DIAGNOSIS — M419 Scoliosis, unspecified: Secondary | ICD-10-CM | POA: Diagnosis not present

## 2018-06-29 DIAGNOSIS — I739 Peripheral vascular disease, unspecified: Secondary | ICD-10-CM | POA: Diagnosis not present

## 2018-06-30 DIAGNOSIS — I739 Peripheral vascular disease, unspecified: Secondary | ICD-10-CM | POA: Diagnosis not present

## 2018-06-30 DIAGNOSIS — N183 Chronic kidney disease, stage 3 (moderate): Secondary | ICD-10-CM | POA: Diagnosis not present

## 2018-06-30 DIAGNOSIS — M419 Scoliosis, unspecified: Secondary | ICD-10-CM | POA: Diagnosis not present

## 2018-06-30 DIAGNOSIS — M47816 Spondylosis without myelopathy or radiculopathy, lumbar region: Secondary | ICD-10-CM | POA: Diagnosis not present

## 2018-06-30 DIAGNOSIS — M5137 Other intervertebral disc degeneration, lumbosacral region: Secondary | ICD-10-CM | POA: Diagnosis not present

## 2018-06-30 DIAGNOSIS — I129 Hypertensive chronic kidney disease with stage 1 through stage 4 chronic kidney disease, or unspecified chronic kidney disease: Secondary | ICD-10-CM | POA: Diagnosis not present

## 2018-07-05 DIAGNOSIS — M47816 Spondylosis without myelopathy or radiculopathy, lumbar region: Secondary | ICD-10-CM | POA: Diagnosis not present

## 2018-07-05 DIAGNOSIS — I129 Hypertensive chronic kidney disease with stage 1 through stage 4 chronic kidney disease, or unspecified chronic kidney disease: Secondary | ICD-10-CM | POA: Diagnosis not present

## 2018-07-05 DIAGNOSIS — M419 Scoliosis, unspecified: Secondary | ICD-10-CM | POA: Diagnosis not present

## 2018-07-05 DIAGNOSIS — N183 Chronic kidney disease, stage 3 (moderate): Secondary | ICD-10-CM | POA: Diagnosis not present

## 2018-07-05 DIAGNOSIS — M5137 Other intervertebral disc degeneration, lumbosacral region: Secondary | ICD-10-CM | POA: Diagnosis not present

## 2018-07-05 DIAGNOSIS — I739 Peripheral vascular disease, unspecified: Secondary | ICD-10-CM | POA: Diagnosis not present

## 2018-07-07 DIAGNOSIS — R21 Rash and other nonspecific skin eruption: Secondary | ICD-10-CM | POA: Diagnosis not present

## 2018-07-07 DIAGNOSIS — M419 Scoliosis, unspecified: Secondary | ICD-10-CM | POA: Diagnosis not present

## 2018-07-07 DIAGNOSIS — I129 Hypertensive chronic kidney disease with stage 1 through stage 4 chronic kidney disease, or unspecified chronic kidney disease: Secondary | ICD-10-CM | POA: Diagnosis not present

## 2018-07-07 DIAGNOSIS — Z0000 Encounter for general adult medical examination without abnormal findings: Secondary | ICD-10-CM | POA: Diagnosis not present

## 2018-07-07 DIAGNOSIS — R413 Other amnesia: Secondary | ICD-10-CM | POA: Diagnosis not present

## 2018-07-07 DIAGNOSIS — M5137 Other intervertebral disc degeneration, lumbosacral region: Secondary | ICD-10-CM | POA: Diagnosis not present

## 2018-07-07 DIAGNOSIS — L989 Disorder of the skin and subcutaneous tissue, unspecified: Secondary | ICD-10-CM | POA: Diagnosis not present

## 2018-07-07 DIAGNOSIS — M47816 Spondylosis without myelopathy or radiculopathy, lumbar region: Secondary | ICD-10-CM | POA: Diagnosis not present

## 2018-07-07 DIAGNOSIS — N183 Chronic kidney disease, stage 3 (moderate): Secondary | ICD-10-CM | POA: Diagnosis not present

## 2018-07-07 DIAGNOSIS — I739 Peripheral vascular disease, unspecified: Secondary | ICD-10-CM | POA: Diagnosis not present

## 2018-07-10 ENCOUNTER — Other Ambulatory Visit: Payer: Self-pay | Admitting: Cardiology

## 2018-07-12 DIAGNOSIS — M47816 Spondylosis without myelopathy or radiculopathy, lumbar region: Secondary | ICD-10-CM | POA: Diagnosis not present

## 2018-07-12 DIAGNOSIS — M5137 Other intervertebral disc degeneration, lumbosacral region: Secondary | ICD-10-CM | POA: Diagnosis not present

## 2018-07-12 DIAGNOSIS — M419 Scoliosis, unspecified: Secondary | ICD-10-CM | POA: Diagnosis not present

## 2018-07-12 DIAGNOSIS — I129 Hypertensive chronic kidney disease with stage 1 through stage 4 chronic kidney disease, or unspecified chronic kidney disease: Secondary | ICD-10-CM | POA: Diagnosis not present

## 2018-07-12 DIAGNOSIS — I739 Peripheral vascular disease, unspecified: Secondary | ICD-10-CM | POA: Diagnosis not present

## 2018-07-12 DIAGNOSIS — N183 Chronic kidney disease, stage 3 (moderate): Secondary | ICD-10-CM | POA: Diagnosis not present

## 2018-07-14 DIAGNOSIS — M5137 Other intervertebral disc degeneration, lumbosacral region: Secondary | ICD-10-CM | POA: Diagnosis not present

## 2018-07-14 DIAGNOSIS — M419 Scoliosis, unspecified: Secondary | ICD-10-CM | POA: Diagnosis not present

## 2018-07-14 DIAGNOSIS — I129 Hypertensive chronic kidney disease with stage 1 through stage 4 chronic kidney disease, or unspecified chronic kidney disease: Secondary | ICD-10-CM | POA: Diagnosis not present

## 2018-07-14 DIAGNOSIS — I739 Peripheral vascular disease, unspecified: Secondary | ICD-10-CM | POA: Diagnosis not present

## 2018-07-14 DIAGNOSIS — N183 Chronic kidney disease, stage 3 (moderate): Secondary | ICD-10-CM | POA: Diagnosis not present

## 2018-07-14 DIAGNOSIS — M47816 Spondylosis without myelopathy or radiculopathy, lumbar region: Secondary | ICD-10-CM | POA: Diagnosis not present

## 2018-07-25 NOTE — Progress Notes (Signed)
Subjective:   [name], female    DOB: 1931-05-06, 85 y.o.   MRN: [id number]  Lajean Manes, MD:  No chief complaint on file.   HPI: [name]  is a 85 y.o. Caucasian female  with neurocardiogenic syncope with no recurrence, hypertension and hyperlipidemia, and occasional palpitations. She is accompanied by her Daughter. She has severe kyphoscoliosis and persistent back pain and disability.  She has had ABI in 2017 revealing mild reduction in left ABI. Normal echocardiogram in 2018 with grade 2 diastolic dysfunction and negative nuclear stress test same time.  Patient presents for 6 month follow-up for hypertension. Had significant hyponatremia with Maxzide and is discontinued. Sodium levels have since been stable. Blood pressure has been well controlled. Does report that she has had left heel ulceration for sometime that improves with management and then will reoccur. She currently has wound that they are dressing twice a day. States that ulceration recently started bleeding. Currently being managed by Dr. Felipa Eth. Daughter is questioning if related to circulation. She has previously had PAD with last lower extremity duplex in 2017 that showed monophasic waveforms on the left with ABI of 0.75. Normal ABI on right. She is on Livalo for management of hyperlipidemia and states is well controlled. No former tobacco use. She has not had any significant symptoms of claudication and previously pain was in the right leg in 2017.   Continues to have significant back pain and hip pain and is being managed by or so. Symptoms of dizziness have significantly improved.  Patient is now living in independent living facility that is going well, but patient is on waiting list for assisted living. Her 2 daughters are currently helping to take care of her.   Past Medical History:  Diagnosis Date  . Adenomatous colon polyp 2007  . Arthritis   . Bilateral carotid bruits 07/27/2018  . CAD  (coronary artery disease)   . Chronic back pain   . Gall stone pancreatitis   . Heart murmur   . High cholesterol   . Hx: UTI (urinary tract infection)    Now infrequent  . Hypertension   . Left knee pain Nov. 18, 2014  . Onychomycosis   . Osteoarthritis   . Osteopenia   . PAD (peripheral artery disease) ([place])   . PVD (peripheral vascular disease) ([place])   . Skin ulcer of left heel ([place])   . Trigger finger, right   . Vertigo     Past Surgical History:  Procedure Laterality Date  . ABDOMINAL HYSTERECTOMY     partial  . CHOLECYSTECTOMY  10/30/2011   Procedure: LAPAROSCOPIC CHOLECYSTECTOMY WITH INTRAOPERATIVE CHOLANGIOGRAM;  Surgeon: Zenovia Jarred, MD;  Location: [facility];  Service: General;  Laterality: N/A;  . EUS  11/11/2011   Procedure: ESOPHAGEAL ENDOSCOPIC ULTRASOUND (EUS) RADIAL;  Surgeon: Arta Silence, MD;  Location: [facility];  Service: Endoscopy;  Laterality: N/A;  possible ERCP  . JOINT REPLACEMENT Left 06-22-06   Hip  . JOINT REPLACEMENT Right 11-26-06   Shoulder  . JOINT REPLACEMENT Right 02-27-06   Knee  . TOTAL HIP ARTHROPLASTY    . TOTAL KNEE ARTHROPLASTY    . TOTAL SHOULDER ARTHROPLASTY      Family History  Problem Relation Age of Onset  . Cancer Mother        ovarian  . Pneumonia Father   . Heart attack Brother   . Heart disease Brother        Heart  Disease before age 90    Social History   Socioeconomic History  . Marital status: Widowed    Spouse name: Not on file  . Number of children: 6  . Years of education: College  . Highest education level: Not on file  Occupational History  . Not on file  Social Needs  . Financial resource strain: Not on file  . Food insecurity:    Worry: Not on file    Inability: Not on file  . Transportation needs:    Medical: Not on file    Non-medical: Not on file  Tobacco Use  . Smoking status: Never Smoker  . Smokeless tobacco: Never Used  Substance and Sexual Activity  . Alcohol use: No     Alcohol/week: 0.0 standard drinks  . Drug use: No  . Sexual activity: Not on file  Lifestyle  . Physical activity:    Days per week: Not on file    Minutes per session: Not on file  . Stress: Not on file  Relationships  . Social connections:    Talks on phone: Not on file    Gets together: Not on file    Attends religious service: Not on file    Active member of club or organization: Not on file    Attends meetings of clubs or organizations: Not on file    Relationship status: Not on file  . Intimate partner violence:    Fear of current or ex partner: Not on file    Emotionally abused: Not on file    Physically abused: Not on file    Forced sexual activity: Not on file  Other Topics Concern  . Not on file  Social History Narrative   No caffeine use    Current Meds  Medication Sig  . acetaminophen (TYLENOL) 100 MG/ML solution Take by mouth every 4 (four) hours as needed for fever.   Marland Kitchen amitriptyline (ELAVIL) 10 MG tablet Take 10 mg by mouth at bedtime.  Marland Kitchen amLODipine (NORVASC) 10 MG tablet TAKE 1 TABLET BY MOUTH DAILY (Patient taking differently: 5 mg. )  . aspirin 81 MG tablet Take 81 mg by mouth daily.  . cetirizine (ZYRTEC) 5 MG tablet Take 5 mg by mouth daily.  . cholecalciferol (VITAMIN D) 1000 UNITS tablet Take 1,000 Units by mouth daily.  . clindamycin (CLEOCIN) 150 MG capsule Take by mouth 3 (three) times daily. Before dental work  . diclofenac (VOLTAREN) 0.1 % ophthalmic solution daily as needed.  . hydrALAZINE (APRESOLINE) 25 MG tablet Take 25 mg by mouth. 1 pill as needed when systolic bp goes over 720  . losartan (COZAAR) 100 MG tablet Take 100 mg by mouth daily.  . meclizine (ANTIVERT) 12.5 MG tablet Take 12.5 mg by mouth 3 (three) times daily as needed for dizziness.  . Pitavastatin Calcium (LIVALO) 1 MG TABS Take 2 mg by mouth. 1/2 daily  . traMADol (ULTRAM) 50 MG tablet Take by mouth every 6 (six) hours as needed.     Review of Systems  Constitution:  Negative for decreased appetite, malaise/fatigue, weight gain and weight loss.  Eyes: Negative for visual disturbance.  Cardiovascular: Negative for chest pain, claudication, dyspnea on exertion, leg swelling, orthopnea, palpitations and syncope.  Respiratory: Negative for hemoptysis and wheezing.   Endocrine: Negative for cold intolerance and heat intolerance.  Hematologic/Lymphatic: Does not bruise/bleed easily.  Skin: Positive for poor wound healing (left heel). Negative for nail changes.  Musculoskeletal: Positive for back pain (chronic AND SEVERE.  Lenord Fellers, MD) and joint pain (hip, bilateral knee and severe back pain). Negative for muscle weakness and myalgias.       Decreased Range of Motion -right shoulder.  Physical Disability - walks with a cane.  Gastrointestinal: Negative for abdominal pain, change in bowel habit, nausea and vomiting.  Neurological: Positive for dizziness (occasional dizziness and unsteadiness). Negative for difficulty with concentration, focal weakness and headaches.  Psychiatric/Behavioral: Negative for altered mental status and suicidal ideas.  All other systems reviewed and are negative.      Objective:     Blood pressure 126/69, pulse 81, height '5\' 6"'  (1.676 m), weight 141 lb 3.2 oz (64 kg), SpO2 95 %.  Cardiac studies:  EKG 07/27/2018: Normal sinus rhythm at 77 bpm, left atrial abnormality, normal axis, no evidence of ischemia.   EKG 07/15/2017: Marked sinus bradycardia at rate of 54 bpm, with first-degree AV block and left atrial abnormality. Otherwise normal EKG. No significant change from EKG 10/05/2016: Normal sinus rhythm at rate of 69 bpm, PVCs.  Echocardiogram 10/21/2016: Left ventricle cavity is normal in size. Mild concentric hypertrophy of the left ventricle. Normal global wall motion. Doppler evidence of grade II (pseudonormal) diastolic dysfunction. Diastolic dysfunction do not suggest elevated LA/LV endiastolic pressure. Calculated EF 55%.  Left atrial cavity is mildly dilated by volume. Right atrial cavity is mildly dilated. Mild aortic valve leaflet calcification. Mildly restricted aortic valve leaflets. Trace aortic valve stenosis. Aortic valve peak pressure gradient of 17 and mean gradient of 7 mmHg, calculated aortic valve area 1.97 cm. Mild (Grade I) aortic regurgitation. Mild to moderate mitral regurgitation. Mild to moderate tricuspid regurgitation. Moderate pulmonary hypertension. Pulmonary artery systolic pressure is estimated at 40 mm Hg. Insignificant pericardial effusion. IVC is dilated with poor inspiration collapse consistent with elevated right atrial pressure.  Lexiscan myoview stress test 10/19/2016: 1. The resting electrocardiogram demonstrated normal sinus rhythm, normal resting conduction, no resting arrhythmias and normal rest repolarization. Resting EKG shows NSR. Stress EKG is non-diagnostic for ischemia as it a pharmacologic stress using Lexiscan. Stress symptoms included dyspnea. There was rare PVC and PAC. 2. Myocardial perfusion imaging is normal. Overall left ventricular systolic function was normal without regional wall motion abnormalities. The left ventricular ejection fraction was 74%.  Carotid artery duplex 02/24/2018: Bilateral carotid arteries show mild heterogeneous plaque without stenosis. Antegrade right vertebral artery flow. Antegrade left vertebral artery flow. No significant change since 02/19/2016.  ABI 12/05/2015: Mildly abnormal (biphasic) waveforms of the right ankle. Moderately abnormal Monophasic waveforms of the left ankle. This exam reveals normal perfusion of the right lower extremity with RABI 1.0 and moderately decreased perfusion of the left lower extremity with LABI 0.75, noted at the post tibial artery level.  Cardiac Cath on 06/01/2006: told to be normal.  Recent Labs:  01/03/2018: Creatinine 1.01, EGFR 52/63, sodium 135, potassium 4.1, BMP otherwise normal.  Physical Exam    Constitutional: She appears well-developed and well-nourished. No distress.  Petite   HENT:  Head: Atraumatic.  Eyes: Conjunctivae are normal.  Neck: Neck supple. No JVD present. No thyromegaly present.  Cardiovascular: Normal rate, regular rhythm and intact distal pulses. Exam reveals no gallop.  Murmur heard.  Early systolic murmur is present with a grade of 2/6. Aortic Area  Pulses:      Carotid pulses are on the right side with bruit and on the left side with bruit.      Femoral pulses are 1+ on the right side and 1+ on the left side.  Popliteal pulses are 1+ on the right side and 1+ on the left side.       Dorsalis pedis pulses are 2+ on the right side and 2+ on the left side.       Posterior tibial pulses are 1+ on the right side and 1+ on the left side.  Pulmonary/Chest: Effort normal and breath sounds normal.  Abdominal: Soft. Bowel sounds are normal.  Musculoskeletal: Normal range of motion.        General: No edema.  Neurological: She is alert.  Skin: Skin is warm and dry. No cyanosis.  Left heel ulceration that is been present for several months and is non-healing. Currently has dressing that is dry and intact.   Psychiatric: She has a normal mood and affect.        Assessment & Recommendations:  1. Essential hypertension Blood pressure remains well controlled with current medical therapy.  Has not had any dizziness.  Orthostatic hypotension resolved with discontinuing doxazosin.  Hyponatremia has resolved with discontinuing Maxide.  2. Lower extremity ulceration, left, with unspecified severity ([place]) Patient has had ongoing left heel ulceration for the last several months.  She has not had evaluation of PAD in the last several years.  No evidence of ischemic limb.  She does have mildly abnormal vascular exam.  Will obtain lower extremity arterial duplex for further evaluation.  She was previously on Plavix, unsure as to why this was discontinued.  Currently on  statin and aspirin therapy.  Will await test results before making further recommendations.  Would recommend conservative measures in view of her advanced age.    3. PAD (peripheral artery disease) ([place]) Patient was previously on Plavix and aspirin, unsure as to why Plavix was discontinued.  Denies any claudication symptoms; however, difficult to differentiate if she has chronic significant back pain from kyphosis and spinal stenosis.  4. Bilateral carotid bruits Last carotid duplex in Oct 2019 showed mild heterogeneous plaque without stenosis. Continue with ASA and statin therapy.  Plan: Unless her lower extremity duplex is markedly abnormal, I will see her back in 6 months.    Jeri Lager, MSN, APRN, FNP-C [facility], [place] Office: [phone] Fax: [phone]

## 2018-07-27 ENCOUNTER — Ambulatory Visit (INDEPENDENT_AMBULATORY_CARE_PROVIDER_SITE_OTHER): Payer: Medicare Other | Admitting: Cardiology

## 2018-07-27 ENCOUNTER — Encounter: Payer: Self-pay | Admitting: Cardiology

## 2018-07-27 ENCOUNTER — Other Ambulatory Visit: Payer: Self-pay

## 2018-07-27 VITALS — BP 126/69 | HR 81 | Ht 66.0 in | Wt 141.2 lb

## 2018-07-27 DIAGNOSIS — R0989 Other specified symptoms and signs involving the circulatory and respiratory systems: Secondary | ICD-10-CM | POA: Diagnosis not present

## 2018-07-27 DIAGNOSIS — L97929 Non-pressure chronic ulcer of unspecified part of left lower leg with unspecified severity: Secondary | ICD-10-CM | POA: Diagnosis not present

## 2018-07-27 DIAGNOSIS — I739 Peripheral vascular disease, unspecified: Secondary | ICD-10-CM | POA: Diagnosis not present

## 2018-07-27 DIAGNOSIS — I10 Essential (primary) hypertension: Secondary | ICD-10-CM

## 2018-07-27 HISTORY — DX: Other specified symptoms and signs involving the circulatory and respiratory systems: R09.89

## 2018-08-09 DIAGNOSIS — I129 Hypertensive chronic kidney disease with stage 1 through stage 4 chronic kidney disease, or unspecified chronic kidney disease: Secondary | ICD-10-CM | POA: Diagnosis not present

## 2018-08-09 DIAGNOSIS — G301 Alzheimer's disease with late onset: Secondary | ICD-10-CM | POA: Diagnosis not present

## 2018-08-09 DIAGNOSIS — N183 Chronic kidney disease, stage 3 (moderate): Secondary | ICD-10-CM | POA: Diagnosis not present

## 2018-08-09 DIAGNOSIS — F0280 Dementia in other diseases classified elsewhere without behavioral disturbance: Secondary | ICD-10-CM | POA: Diagnosis not present

## 2018-08-09 DIAGNOSIS — I83025 Varicose veins of left lower extremity with ulcer other part of foot: Secondary | ICD-10-CM | POA: Diagnosis not present

## 2018-08-09 DIAGNOSIS — L97422 Non-pressure chronic ulcer of left heel and midfoot with fat layer exposed: Secondary | ICD-10-CM | POA: Diagnosis not present

## 2018-08-10 ENCOUNTER — Other Ambulatory Visit: Payer: Medicare Other

## 2018-08-11 DIAGNOSIS — I129 Hypertensive chronic kidney disease with stage 1 through stage 4 chronic kidney disease, or unspecified chronic kidney disease: Secondary | ICD-10-CM | POA: Diagnosis not present

## 2018-08-11 DIAGNOSIS — I83025 Varicose veins of left lower extremity with ulcer other part of foot: Secondary | ICD-10-CM | POA: Diagnosis not present

## 2018-08-11 DIAGNOSIS — L97422 Non-pressure chronic ulcer of left heel and midfoot with fat layer exposed: Secondary | ICD-10-CM | POA: Diagnosis not present

## 2018-08-11 DIAGNOSIS — F0280 Dementia in other diseases classified elsewhere without behavioral disturbance: Secondary | ICD-10-CM | POA: Diagnosis not present

## 2018-08-11 DIAGNOSIS — G301 Alzheimer's disease with late onset: Secondary | ICD-10-CM | POA: Diagnosis not present

## 2018-08-11 DIAGNOSIS — N183 Chronic kidney disease, stage 3 (moderate): Secondary | ICD-10-CM | POA: Diagnosis not present

## 2018-08-15 DIAGNOSIS — F0280 Dementia in other diseases classified elsewhere without behavioral disturbance: Secondary | ICD-10-CM | POA: Diagnosis not present

## 2018-08-15 DIAGNOSIS — N183 Chronic kidney disease, stage 3 (moderate): Secondary | ICD-10-CM | POA: Diagnosis not present

## 2018-08-15 DIAGNOSIS — G301 Alzheimer's disease with late onset: Secondary | ICD-10-CM | POA: Diagnosis not present

## 2018-08-15 DIAGNOSIS — L97422 Non-pressure chronic ulcer of left heel and midfoot with fat layer exposed: Secondary | ICD-10-CM | POA: Diagnosis not present

## 2018-08-15 DIAGNOSIS — I83025 Varicose veins of left lower extremity with ulcer other part of foot: Secondary | ICD-10-CM | POA: Diagnosis not present

## 2018-08-15 DIAGNOSIS — I129 Hypertensive chronic kidney disease with stage 1 through stage 4 chronic kidney disease, or unspecified chronic kidney disease: Secondary | ICD-10-CM | POA: Diagnosis not present

## 2018-08-19 DIAGNOSIS — I83025 Varicose veins of left lower extremity with ulcer other part of foot: Secondary | ICD-10-CM | POA: Diagnosis not present

## 2018-08-19 DIAGNOSIS — F0280 Dementia in other diseases classified elsewhere without behavioral disturbance: Secondary | ICD-10-CM | POA: Diagnosis not present

## 2018-08-19 DIAGNOSIS — N183 Chronic kidney disease, stage 3 (moderate): Secondary | ICD-10-CM | POA: Diagnosis not present

## 2018-08-19 DIAGNOSIS — G301 Alzheimer's disease with late onset: Secondary | ICD-10-CM | POA: Diagnosis not present

## 2018-08-19 DIAGNOSIS — I129 Hypertensive chronic kidney disease with stage 1 through stage 4 chronic kidney disease, or unspecified chronic kidney disease: Secondary | ICD-10-CM | POA: Diagnosis not present

## 2018-08-19 DIAGNOSIS — L97422 Non-pressure chronic ulcer of left heel and midfoot with fat layer exposed: Secondary | ICD-10-CM | POA: Diagnosis not present

## 2018-08-22 DIAGNOSIS — L97422 Non-pressure chronic ulcer of left heel and midfoot with fat layer exposed: Secondary | ICD-10-CM | POA: Diagnosis not present

## 2018-08-22 DIAGNOSIS — G301 Alzheimer's disease with late onset: Secondary | ICD-10-CM | POA: Diagnosis not present

## 2018-08-22 DIAGNOSIS — I129 Hypertensive chronic kidney disease with stage 1 through stage 4 chronic kidney disease, or unspecified chronic kidney disease: Secondary | ICD-10-CM | POA: Diagnosis not present

## 2018-08-22 DIAGNOSIS — F0280 Dementia in other diseases classified elsewhere without behavioral disturbance: Secondary | ICD-10-CM | POA: Diagnosis not present

## 2018-08-22 DIAGNOSIS — N183 Chronic kidney disease, stage 3 (moderate): Secondary | ICD-10-CM | POA: Diagnosis not present

## 2018-08-22 DIAGNOSIS — I83025 Varicose veins of left lower extremity with ulcer other part of foot: Secondary | ICD-10-CM | POA: Diagnosis not present

## 2018-08-24 DIAGNOSIS — I129 Hypertensive chronic kidney disease with stage 1 through stage 4 chronic kidney disease, or unspecified chronic kidney disease: Secondary | ICD-10-CM | POA: Diagnosis not present

## 2018-08-24 DIAGNOSIS — G8929 Other chronic pain: Secondary | ICD-10-CM | POA: Diagnosis not present

## 2018-08-24 DIAGNOSIS — L89151 Pressure ulcer of sacral region, stage 1: Secondary | ICD-10-CM | POA: Diagnosis not present

## 2018-08-24 DIAGNOSIS — Z0000 Encounter for general adult medical examination without abnormal findings: Secondary | ICD-10-CM | POA: Diagnosis not present

## 2018-08-24 DIAGNOSIS — F3289 Other specified depressive episodes: Secondary | ICD-10-CM | POA: Diagnosis not present

## 2018-08-24 DIAGNOSIS — N183 Chronic kidney disease, stage 3 (moderate): Secondary | ICD-10-CM | POA: Diagnosis not present

## 2018-08-24 DIAGNOSIS — M5442 Lumbago with sciatica, left side: Secondary | ICD-10-CM | POA: Diagnosis not present

## 2018-08-25 DIAGNOSIS — G301 Alzheimer's disease with late onset: Secondary | ICD-10-CM | POA: Diagnosis not present

## 2018-08-25 DIAGNOSIS — F0280 Dementia in other diseases classified elsewhere without behavioral disturbance: Secondary | ICD-10-CM | POA: Diagnosis not present

## 2018-08-25 DIAGNOSIS — N183 Chronic kidney disease, stage 3 (moderate): Secondary | ICD-10-CM | POA: Diagnosis not present

## 2018-08-25 DIAGNOSIS — I83025 Varicose veins of left lower extremity with ulcer other part of foot: Secondary | ICD-10-CM | POA: Diagnosis not present

## 2018-08-25 DIAGNOSIS — L97422 Non-pressure chronic ulcer of left heel and midfoot with fat layer exposed: Secondary | ICD-10-CM | POA: Diagnosis not present

## 2018-08-25 DIAGNOSIS — I129 Hypertensive chronic kidney disease with stage 1 through stage 4 chronic kidney disease, or unspecified chronic kidney disease: Secondary | ICD-10-CM | POA: Diagnosis not present

## 2018-08-29 ENCOUNTER — Telehealth: Payer: Self-pay | Admitting: Cardiology

## 2018-08-29 DIAGNOSIS — I129 Hypertensive chronic kidney disease with stage 1 through stage 4 chronic kidney disease, or unspecified chronic kidney disease: Secondary | ICD-10-CM | POA: Diagnosis not present

## 2018-08-29 DIAGNOSIS — I83025 Varicose veins of left lower extremity with ulcer other part of foot: Secondary | ICD-10-CM | POA: Diagnosis not present

## 2018-08-29 DIAGNOSIS — G301 Alzheimer's disease with late onset: Secondary | ICD-10-CM | POA: Diagnosis not present

## 2018-08-29 DIAGNOSIS — L97422 Non-pressure chronic ulcer of left heel and midfoot with fat layer exposed: Secondary | ICD-10-CM | POA: Diagnosis not present

## 2018-08-29 DIAGNOSIS — F0280 Dementia in other diseases classified elsewhere without behavioral disturbance: Secondary | ICD-10-CM | POA: Diagnosis not present

## 2018-08-29 DIAGNOSIS — N183 Chronic kidney disease, stage 3 (moderate): Secondary | ICD-10-CM | POA: Diagnosis not present

## 2018-08-29 NOTE — Telephone Encounter (Signed)
Contacted patients daughter as we have been unable to perform her LE arterial duplex due to COVID 19 restrictions. She continues to have non-healing ulceration. She is unsure if patient is off of Plavix and as to why it would have been stopped. Being managed by Dr. Felipa Eth and currently has dressing changes with home health nurse. As we are now doing the ultrasounds in our office one day a week, I will schedule for this to be done ASAP. Patients daughter is aware.

## 2018-09-01 DIAGNOSIS — L97422 Non-pressure chronic ulcer of left heel and midfoot with fat layer exposed: Secondary | ICD-10-CM | POA: Diagnosis not present

## 2018-09-01 DIAGNOSIS — I83025 Varicose veins of left lower extremity with ulcer other part of foot: Secondary | ICD-10-CM | POA: Diagnosis not present

## 2018-09-01 DIAGNOSIS — G301 Alzheimer's disease with late onset: Secondary | ICD-10-CM | POA: Diagnosis not present

## 2018-09-01 DIAGNOSIS — N183 Chronic kidney disease, stage 3 (moderate): Secondary | ICD-10-CM | POA: Diagnosis not present

## 2018-09-01 DIAGNOSIS — F0280 Dementia in other diseases classified elsewhere without behavioral disturbance: Secondary | ICD-10-CM | POA: Diagnosis not present

## 2018-09-01 DIAGNOSIS — I129 Hypertensive chronic kidney disease with stage 1 through stage 4 chronic kidney disease, or unspecified chronic kidney disease: Secondary | ICD-10-CM | POA: Diagnosis not present

## 2018-09-02 ENCOUNTER — Telehealth: Payer: Self-pay

## 2018-09-02 NOTE — Telephone Encounter (Signed)
Was made aware for the last few days patient has been leaning more to right side and is also complaining of right sided headache that radiates down to her neck. No slurred speech. She has some limited ROM from scoliosis, which is unchanged. She had one episode of mild confusion, but does have some dementia. BP has been controlled. I feel that that her symptoms are likely musculoskeletal related to her scoliosis. Low suspicion for CVA given lack of other symptoms. I have asked her to make PCP aware to see if he feels she needs further evaluation or what is recommendations would be. She is coming into the office next week for vascular ultrasound and will briefly see her at that time if able.

## 2018-09-05 DIAGNOSIS — I129 Hypertensive chronic kidney disease with stage 1 through stage 4 chronic kidney disease, or unspecified chronic kidney disease: Secondary | ICD-10-CM | POA: Diagnosis not present

## 2018-09-05 DIAGNOSIS — N183 Chronic kidney disease, stage 3 (moderate): Secondary | ICD-10-CM | POA: Diagnosis not present

## 2018-09-05 DIAGNOSIS — I83025 Varicose veins of left lower extremity with ulcer other part of foot: Secondary | ICD-10-CM | POA: Diagnosis not present

## 2018-09-05 DIAGNOSIS — L97422 Non-pressure chronic ulcer of left heel and midfoot with fat layer exposed: Secondary | ICD-10-CM | POA: Diagnosis not present

## 2018-09-05 DIAGNOSIS — F0280 Dementia in other diseases classified elsewhere without behavioral disturbance: Secondary | ICD-10-CM | POA: Diagnosis not present

## 2018-09-05 DIAGNOSIS — G301 Alzheimer's disease with late onset: Secondary | ICD-10-CM | POA: Diagnosis not present

## 2018-09-07 ENCOUNTER — Ambulatory Visit: Payer: Medicare Other

## 2018-09-07 ENCOUNTER — Other Ambulatory Visit: Payer: Medicare Other

## 2018-09-07 ENCOUNTER — Other Ambulatory Visit: Payer: Self-pay

## 2018-09-07 DIAGNOSIS — L97929 Non-pressure chronic ulcer of unspecified part of left lower leg with unspecified severity: Secondary | ICD-10-CM

## 2018-09-12 ENCOUNTER — Telehealth: Payer: Self-pay | Admitting: Cardiology

## 2018-09-12 MED ORDER — CLOPIDOGREL BISULFATE 75 MG PO TABS: 75.0000 mg | ORAL_TABLET | Freq: Every day | ORAL | 1 refills | Status: DC

## 2018-09-12 NOTE — Telephone Encounter (Signed)
LE arterial results were discussed with patients daughter, [name], as well as her other daughter [name]. Noted to have likely high grade stenosis in her left SFA. In view of her non-healing ulcer, feel that her best option would be to pursue PV angiogram.  Will schedule for peripheral arteriogram and possible angioplasty given symptoms. Patient understands the risks, benefits, alternatives including medical therapy, CT angiography. Patient understands <1-2% risk of death, embolic complications, bleeding, infection, renal failure, urgent surgical revascularization, but not limited to these and wants to proceed. Daughters both agree and will discuss with their mom. During the interim, will start her back on Plavix along with her ASA. Office will contact them regarding scheduling and further instructions. Encouraged them to contact me sooner for any questions or concerns.

## 2018-09-14 ENCOUNTER — Other Ambulatory Visit: Payer: Self-pay | Admitting: Cardiology

## 2018-09-14 DIAGNOSIS — I739 Peripheral vascular disease, unspecified: Secondary | ICD-10-CM

## 2018-09-14 DIAGNOSIS — L97929 Non-pressure chronic ulcer of unspecified part of left lower leg with unspecified severity: Secondary | ICD-10-CM

## 2018-09-19 ENCOUNTER — Telehealth: Payer: Self-pay | Admitting: Cardiology

## 2018-09-19 DIAGNOSIS — L97929 Non-pressure chronic ulcer of unspecified part of left lower leg with unspecified severity: Secondary | ICD-10-CM | POA: Diagnosis not present

## 2018-09-19 DIAGNOSIS — I739 Peripheral vascular disease, unspecified: Secondary | ICD-10-CM | POA: Diagnosis not present

## 2018-09-19 NOTE — Telephone Encounter (Signed)
Called patient's daughter as she had called friday, while I was out of office. Patient and her daughters have a lot of anxiety regarding the procedure and inability to be with their mom for the procedure. I have reassured them that she will be with or easily accessible to a nurse at all times. She should be able to be discharged same day unless complications or felt to be needed for continued observation after the procedure. I have reassured them. Encouraged them to contact me for any other questions or concerns.   On reviewing previous notes from [employer], it appears that she has been off Plavix from sometime around 2017-2018 and daughter feels that this was stopped due to bruising. Claudication symptoms had remained stable and no evidence of critical limb ischemia; therefore, felt to continue with clinical observation.

## 2018-09-20 LAB — BASIC METABOLIC PANEL
BUN/Creatinine Ratio: 17 (ref 12–28)
BUN: 18 mg/dL (ref 8–27)
CO2: 25 mmol/L (ref 20–29)
Calcium: 9 mg/dL (ref 8.7–10.3)
Chloride: 103 mmol/L (ref 96–106)
Creatinine, Ser: 1.04 mg/dL — ABNORMAL HIGH (ref 0.57–1.00)
GFR calc Af Amer: 57 mL/min/{1.73_m2} — ABNORMAL LOW (ref >59)
GFR calc non Af Amer: 49 mL/min/{1.73_m2} — ABNORMAL LOW (ref >59)
Glucose: 96 mg/dL (ref 65–99)
Potassium: 3.9 mmol/L (ref 3.5–5.2)
Sodium: 141 mmol/L (ref 134–144)

## 2018-09-20 LAB — CBC
Hematocrit: 37.2 % (ref 34.0–46.6)
Hemoglobin: 12.4 g/dL (ref 11.1–15.9)
MCH: 32.1 pg (ref 26.6–33.0)
MCHC: 33.3 g/dL (ref 31.5–35.7)
MCV: 96 fL (ref 79–97)
Platelets: 255 10*3/uL (ref 150–450)
RBC: 3.86 x10E6/uL (ref 3.77–5.28)
RDW: 11.4 % — ABNORMAL LOW (ref 11.7–15.4)
WBC: 6.7 10*3/uL (ref 3.4–10.8)

## 2018-09-22 NOTE — Progress Notes (Signed)
Spoke with patient's daughter Florentina Jenny, patient is scheduled for Covid screening on 09/23/18 at 1430 at the [place] location.

## 2018-09-23 ENCOUNTER — Other Ambulatory Visit (HOSPITAL_COMMUNITY)
Admission: RE | Admit: 2018-09-23 | Discharge: 2018-09-23 | Disposition: A | Discharge status: Home / Self Care | Payer: Medicare Other | Source: Ambulatory Visit | Attending: Cardiology | Admitting: Cardiology

## 2018-09-23 ENCOUNTER — Other Ambulatory Visit: Payer: Self-pay

## 2018-09-23 DIAGNOSIS — Z1159 Encounter for screening for other viral diseases: Secondary | ICD-10-CM | POA: Insufficient documentation

## 2018-09-25 LAB — NOVEL CORONAVIRUS, NAA (HOSP ORDER, SEND-OUT TO REF LAB; TAT 18-24 HRS): SARS-CoV-2, NAA: NOT DETECTED

## 2018-09-27 ENCOUNTER — Inpatient Hospital Stay (HOSPITAL_COMMUNITY)
Admission: RE | Admit: 2018-09-27 | Discharge: 2018-09-29 | DRG: 253 | Disposition: A | Discharge status: Skilled Nursing Facility | Payer: Medicare Other | Attending: Cardiology | Admitting: Cardiology

## 2018-09-27 ENCOUNTER — Encounter (HOSPITAL_COMMUNITY)
Admission: RE | Disposition: A | Discharge status: Skilled Nursing Facility | Payer: Self-pay | Source: Home / Self Care | Attending: Cardiology

## 2018-09-27 ENCOUNTER — Observation Stay (HOSPITAL_COMMUNITY): Payer: Medicare Other | Admitting: Anesthesiology

## 2018-09-27 ENCOUNTER — Encounter (HOSPITAL_COMMUNITY): Payer: Medicare Other

## 2018-09-27 ENCOUNTER — Other Ambulatory Visit: Payer: Self-pay

## 2018-09-27 ENCOUNTER — Encounter (HOSPITAL_COMMUNITY): Payer: Self-pay | Admitting: Cardiology

## 2018-09-27 DIAGNOSIS — L97429 Non-pressure chronic ulcer of left heel and midfoot with unspecified severity: Secondary | ICD-10-CM | POA: Diagnosis not present

## 2018-09-27 DIAGNOSIS — Z7902 Long term (current) use of antithrombotics/antiplatelets: Secondary | ICD-10-CM

## 2018-09-27 DIAGNOSIS — I2510 Atherosclerotic heart disease of native coronary artery without angina pectoris: Secondary | ICD-10-CM | POA: Diagnosis present

## 2018-09-27 DIAGNOSIS — I998 Other disorder of circulatory system: Secondary | ICD-10-CM | POA: Diagnosis present

## 2018-09-27 DIAGNOSIS — K8020 Calculus of gallbladder without cholecystitis without obstruction: Secondary | ICD-10-CM | POA: Diagnosis not present

## 2018-09-27 DIAGNOSIS — N183 Chronic kidney disease, stage 3 (moderate): Secondary | ICD-10-CM | POA: Diagnosis not present

## 2018-09-27 DIAGNOSIS — I739 Peripheral vascular disease, unspecified: Secondary | ICD-10-CM | POA: Diagnosis not present

## 2018-09-27 DIAGNOSIS — Z8249 Family history of ischemic heart disease and other diseases of the circulatory system: Secondary | ICD-10-CM | POA: Diagnosis not present

## 2018-09-27 DIAGNOSIS — I129 Hypertensive chronic kidney disease with stage 1 through stage 4 chronic kidney disease, or unspecified chronic kidney disease: Secondary | ICD-10-CM | POA: Diagnosis present

## 2018-09-27 DIAGNOSIS — I7777 Dissection of artery of lower extremity: Principal | ICD-10-CM | POA: Diagnosis present

## 2018-09-27 DIAGNOSIS — I70248 Atherosclerosis of native arteries of left leg with ulceration of other part of lower left leg: Secondary | ICD-10-CM

## 2018-09-27 DIAGNOSIS — I70292 Other atherosclerosis of native arteries of extremities, left leg: Secondary | ICD-10-CM | POA: Diagnosis not present

## 2018-09-27 DIAGNOSIS — E785 Hyperlipidemia, unspecified: Secondary | ICD-10-CM | POA: Diagnosis present

## 2018-09-27 DIAGNOSIS — M1990 Unspecified osteoarthritis, unspecified site: Secondary | ICD-10-CM | POA: Diagnosis present

## 2018-09-27 DIAGNOSIS — Z7982 Long term (current) use of aspirin: Secondary | ICD-10-CM | POA: Diagnosis not present

## 2018-09-27 DIAGNOSIS — I743 Embolism and thrombosis of arteries of the lower extremities: Secondary | ICD-10-CM | POA: Diagnosis not present

## 2018-09-27 HISTORY — PX: LOWER EXTREMITY ANGIOGRAPHY: CATH118251

## 2018-09-27 HISTORY — PX: PERIPHERAL VASCULAR INTERVENTION: CATH118257

## 2018-09-27 HISTORY — PX: FEMORAL-POPLITEAL BYPASS GRAFT: SHX937

## 2018-09-27 HISTORY — PX: PATCH ANGIOPLASTY: SHX6230

## 2018-09-27 HISTORY — PX: ENDARTERECTOMY FEMORAL: SHX5804

## 2018-09-27 LAB — SURGICAL PCR SCREEN
MRSA, PCR: NEGATIVE
Staphylococcus aureus: NEGATIVE

## 2018-09-27 LAB — CBC
HCT: 31.5 % — ABNORMAL LOW (ref 36.0–46.0)
Hemoglobin: 10.3 g/dL — ABNORMAL LOW (ref 12.0–15.0)
MCH: 32.5 pg (ref 26.0–34.0)
MCHC: 32.7 g/dL (ref 30.0–36.0)
MCV: 99.4 fL (ref 80.0–100.0)
Platelets: 198 10*3/uL (ref 150–400)
RBC: 3.17 MIL/uL — ABNORMAL LOW (ref 3.87–5.11)
RDW: 11.7 % (ref 11.5–15.5)
WBC: 12 10*3/uL — ABNORMAL HIGH (ref 4.0–10.5)
nRBC: 0 % (ref 0.0–0.2)

## 2018-09-27 LAB — POCT ACTIVATED CLOTTING TIME
Activated Clotting Time: 224 seconds
Activated Clotting Time: 252 seconds
Activated Clotting Time: 323 seconds
Activated Clotting Time: 312 seconds

## 2018-09-27 SURGERY — BYPASS GRAFT FEMORAL-POPLITEAL ARTERY
Anesthesia: General | Site: Leg Upper | Laterality: Left

## 2018-09-27 SURGERY — LOWER EXTREMITY ANGIOGRAPHY
Anesthesia: LOCAL

## 2018-09-27 MED ORDER — MORPHINE SULFATE (PF) 2 MG/ML IV SOLN: 1.0000 mg | INTRAVENOUS | Status: DC | PRN

## 2018-09-27 MED ORDER — FAMOTIDINE IN NACL 20-0.9 MG/50ML-% IV SOLN
INTRAVENOUS | Status: AC | PRN
  Administered 2018-09-27: 20 mg via INTRAVENOUS

## 2018-09-27 MED ORDER — HEPARIN (PORCINE) IN NACL 1000-0.9 UT/500ML-% IV SOLN
INTRAVENOUS | Status: DC | PRN
  Administered 2018-09-27 (×2): 500 mL

## 2018-09-27 MED ORDER — DOCUSATE SODIUM 100 MG PO CAPS
100.0000 mg | ORAL_CAPSULE | Freq: Every day | ORAL | Status: DC
  Administered 2018-09-28 – 2018-09-29 (×2): 100 mg via ORAL
  Filled 2018-09-27 (×2): qty 1

## 2018-09-27 MED ORDER — HEPARIN (PORCINE) IN NACL 1000-0.9 UT/500ML-% IV SOLN
INTRAVENOUS | Status: AC
  Filled 2018-09-27: qty 1000

## 2018-09-27 MED ORDER — GLYCOPYRROLATE PF 0.2 MG/ML IJ SOSY
PREFILLED_SYRINGE | INTRAMUSCULAR | Status: AC
  Filled 2018-09-27: qty 1

## 2018-09-27 MED ORDER — POTASSIUM CHLORIDE CRYS ER 20 MEQ PO TBCR: 20.0000 meq | EXTENDED_RELEASE_TABLET | Freq: Every day | ORAL | Status: DC | PRN

## 2018-09-27 MED ORDER — GUAIFENESIN-DM 100-10 MG/5ML PO SYRP: 15.0000 mL | ORAL_SOLUTION | ORAL | Status: DC | PRN

## 2018-09-27 MED ORDER — SODIUM CHLORIDE 0.9 % IV SOLN: 500.0000 mL | Freq: Once | INTRAVENOUS | Status: DC | PRN

## 2018-09-27 MED ORDER — MIDAZOLAM HCL 2 MG/2ML IJ SOLN
INTRAMUSCULAR | Status: DC | PRN
  Administered 2018-09-27 (×2): 1 mg via INTRAVENOUS

## 2018-09-27 MED ORDER — ONDANSETRON HCL 4 MG/2ML IJ SOLN
INTRAMUSCULAR | Status: AC
  Filled 2018-09-27: qty 2

## 2018-09-27 MED ORDER — SODIUM CHLORIDE 0.9 % IV SOLN
INTRAVENOUS | Status: DC | PRN
  Administered 2018-09-27: 25 ug/min via INTRAVENOUS

## 2018-09-27 MED ORDER — ASPIRIN EC 81 MG PO TBEC
81.0000 mg | DELAYED_RELEASE_TABLET | Freq: Every day | ORAL | Status: DC
  Administered 2018-09-28 – 2018-09-29 (×2): 81 mg via ORAL
  Filled 2018-09-27 (×2): qty 1

## 2018-09-27 MED ORDER — LIDOCAINE HCL (PF) 1 % IJ SOLN
INTRAMUSCULAR | Status: DC | PRN
  Administered 2018-09-27: 5 mL
  Administered 2018-09-27: 20 mL

## 2018-09-27 MED ORDER — MAGNESIUM SULFATE 2 GM/50ML IV SOLN: 2.0000 g | Freq: Every day | INTRAVENOUS | Status: DC | PRN

## 2018-09-27 MED ORDER — VANCOMYCIN HCL IN DEXTROSE 1-5 GM/200ML-% IV SOLN
1000.0000 mg | INTRAVENOUS | Status: AC
  Administered 2018-09-27: 17:00:00 1000 mg via INTRAVENOUS
  Filled 2018-09-27: qty 200

## 2018-09-27 MED ORDER — METOPROLOL TARTRATE 5 MG/5ML IV SOLN: 2.0000 mg | INTRAVENOUS | Status: DC | PRN

## 2018-09-27 MED ORDER — DEXAMETHASONE SODIUM PHOSPHATE 10 MG/ML IJ SOLN
INTRAMUSCULAR | Status: DC | PRN
  Administered 2018-09-27: 5 mg via INTRAVENOUS

## 2018-09-27 MED ORDER — POLYETHYLENE GLYCOL 3350 17 G PO PACK: 17.0000 g | PACK | Freq: Every day | ORAL | Status: DC | PRN

## 2018-09-27 MED ORDER — FAMOTIDINE 20 MG PO TABS
40.0000 mg | ORAL_TABLET | Freq: Every evening | ORAL | Status: DC
  Administered 2018-09-28: 40 mg via ORAL
  Filled 2018-09-27: qty 1
  Filled 2018-09-27: qty 2

## 2018-09-27 MED ORDER — SUCCINYLCHOLINE CHLORIDE 200 MG/10ML IV SOSY
PREFILLED_SYRINGE | INTRAVENOUS | Status: AC
  Filled 2018-09-27: qty 10

## 2018-09-27 MED ORDER — HYDRALAZINE HCL 25 MG PO TABS: 25.0000 mg | ORAL_TABLET | Freq: Every day | ORAL | Status: DC | PRN

## 2018-09-27 MED ORDER — SODIUM CHLORIDE 0.9 % IV SOLN
INTRAVENOUS | Status: AC
  Filled 2018-09-27: qty 1.2

## 2018-09-27 MED ORDER — FENTANYL CITRATE (PF) 100 MCG/2ML IJ SOLN
INTRAMUSCULAR | Status: AC
  Filled 2018-09-27: qty 2

## 2018-09-27 MED ORDER — SODIUM CHLORIDE 0.9% FLUSH
3.0000 mL | Freq: Two times a day (BID) | INTRAVENOUS | Status: DC
  Administered 2018-09-27 – 2018-09-29 (×3): 3 mL via INTRAVENOUS

## 2018-09-27 MED ORDER — VANCOMYCIN HCL IN DEXTROSE 1-5 GM/200ML-% IV SOLN
1000.0000 mg | Freq: Two times a day (BID) | INTRAVENOUS | Status: AC
  Administered 2018-09-27 – 2018-09-28 (×2): 1000 mg via INTRAVENOUS
  Filled 2018-09-27 (×3): qty 200

## 2018-09-27 MED ORDER — GLYCOPYRROLATE PF 0.2 MG/ML IJ SOSY
PREFILLED_SYRINGE | INTRAMUSCULAR | Status: DC | PRN
  Administered 2018-09-27: .2 mg via INTRAVENOUS

## 2018-09-27 MED ORDER — HEPARIN SODIUM (PORCINE) 1000 UNIT/ML IJ SOLN
INTRAMUSCULAR | Status: DC | PRN
  Administered 2018-09-27: 6000 [IU] via INTRAVENOUS

## 2018-09-27 MED ORDER — POLYVINYL ALCOHOL 1.4 % OP SOLN
1.0000 [drp] | OPHTHALMIC | Status: DC | PRN
  Filled 2018-09-27: qty 15

## 2018-09-27 MED ORDER — CLOPIDOGREL BISULFATE 300 MG PO TABS
ORAL_TABLET | ORAL | Status: AC
  Filled 2018-09-27: qty 1

## 2018-09-27 MED ORDER — PROPOFOL 10 MG/ML IV BOLUS
INTRAVENOUS | Status: DC | PRN
  Administered 2018-09-27: 50 mg via INTRAVENOUS
  Administered 2018-09-27: 30 mg via INTRAVENOUS

## 2018-09-27 MED ORDER — PAPAVERINE HCL 30 MG/ML IJ SOLN
INTRAMUSCULAR | Status: AC
  Filled 2018-09-27: qty 2

## 2018-09-27 MED ORDER — LIDOCAINE HCL (PF) 1 % IJ SOLN
INTRAMUSCULAR | Status: AC
  Filled 2018-09-27: qty 30

## 2018-09-27 MED ORDER — HYDRALAZINE HCL 20 MG/ML IJ SOLN: 5.0000 mg | INTRAMUSCULAR | Status: DC | PRN

## 2018-09-27 MED ORDER — LABETALOL HCL 5 MG/ML IV SOLN: 10.0000 mg | INTRAVENOUS | Status: DC | PRN

## 2018-09-27 MED ORDER — HEPARIN SODIUM (PORCINE) 1000 UNIT/ML IJ SOLN
INTRAMUSCULAR | Status: DC | PRN
  Administered 2018-09-27: 3000 [IU] via INTRAVENOUS
  Administered 2018-09-27: 7000 [IU] via INTRAVENOUS
  Administered 2018-09-27 (×2): 3000 [IU] via INTRAVENOUS

## 2018-09-27 MED ORDER — SODIUM CHLORIDE 0.9 % IV SOLN: 250.0000 mL | INTRAVENOUS | Status: DC | PRN

## 2018-09-27 MED ORDER — BISACODYL 10 MG RE SUPP: 10.0000 mg | Freq: Every day | RECTAL | Status: DC | PRN

## 2018-09-27 MED ORDER — CARBOXYMETHYLCELLULOSE SODIUM 0.25 % OP SOLN: Freq: Every day | OPHTHALMIC | Status: DC | PRN

## 2018-09-27 MED ORDER — ACETAMINOPHEN 325 MG PO TABS: 650.0000 mg | ORAL_TABLET | ORAL | Status: DC | PRN

## 2018-09-27 MED ORDER — ONDANSETRON HCL 4 MG/2ML IJ SOLN: 4.0000 mg | Freq: Four times a day (QID) | INTRAMUSCULAR | Status: DC | PRN

## 2018-09-27 MED ORDER — NITROGLYCERIN 1 MG/10 ML FOR IR/CATH LAB
INTRA_ARTERIAL | Status: DC | PRN
  Administered 2018-09-27: 1000 ug via INTRA_ARTERIAL
  Administered 2018-09-27: 600 ug via INTRA_ARTERIAL
  Administered 2018-09-27: 200 ug via INTRA_ARTERIAL

## 2018-09-27 MED ORDER — SODIUM CHLORIDE 0.9% FLUSH: 3.0000 mL | INTRAVENOUS | Status: DC | PRN

## 2018-09-27 MED ORDER — SERTRALINE HCL 25 MG PO TABS
25.0000 mg | ORAL_TABLET | Freq: Every day | ORAL | Status: DC
  Administered 2018-09-28 – 2018-09-29 (×2): 25 mg via ORAL
  Filled 2018-09-27 (×2): qty 1

## 2018-09-27 MED ORDER — ONDANSETRON HCL 4 MG/2ML IJ SOLN
INTRAMUSCULAR | Status: DC | PRN
  Administered 2018-09-27: 4 mg via INTRAVENOUS

## 2018-09-27 MED ORDER — MECLIZINE HCL 25 MG PO TABS
12.5000 mg | ORAL_TABLET | Freq: Three times a day (TID) | ORAL | Status: DC | PRN
  Filled 2018-09-27: qty 1

## 2018-09-27 MED ORDER — MIDAZOLAM HCL 2 MG/2ML IJ SOLN
INTRAMUSCULAR | Status: AC
  Filled 2018-09-27: qty 2

## 2018-09-27 MED ORDER — THROMBIN (RECOMBINANT) 20000 UNITS EX SOLR
CUTANEOUS | Status: AC
  Filled 2018-09-27: qty 20000

## 2018-09-27 MED ORDER — CLOPIDOGREL BISULFATE 75 MG PO TABS
75.0000 mg | ORAL_TABLET | Freq: Every day | ORAL | Status: DC
  Administered 2018-09-28 – 2018-09-29 (×2): 75 mg via ORAL
  Filled 2018-09-27 (×2): qty 1

## 2018-09-27 MED ORDER — HEPARIN SODIUM (PORCINE) 1000 UNIT/ML IJ SOLN
INTRAMUSCULAR | Status: AC
  Filled 2018-09-27: qty 1

## 2018-09-27 MED ORDER — SODIUM CHLORIDE 0.9 % IV BOLUS: 500.0000 mL | Freq: Once | INTRAVENOUS | Status: DC

## 2018-09-27 MED ORDER — MENTHOL (TOPICAL ANALGESIC) 5 % EX PTCH: 1.0000 | MEDICATED_PATCH | Freq: Every day | CUTANEOUS | Status: DC | PRN

## 2018-09-27 MED ORDER — CLOPIDOGREL BISULFATE 300 MG PO TABS
ORAL_TABLET | ORAL | Status: DC | PRN
  Administered 2018-09-27: 300 mg via ORAL

## 2018-09-27 MED ORDER — 0.9 % SODIUM CHLORIDE (POUR BTL) OPTIME
TOPICAL | Status: DC | PRN
  Administered 2018-09-27: 1000 mL

## 2018-09-27 MED ORDER — LIDOCAINE 2% (20 MG/ML) 5 ML SYRINGE
INTRAMUSCULAR | Status: AC
  Filled 2018-09-27: qty 5

## 2018-09-27 MED ORDER — FENTANYL CITRATE (PF) 100 MCG/2ML IJ SOLN
INTRAMUSCULAR | Status: DC | PRN
  Administered 2018-09-27: 100 ug via INTRAVENOUS

## 2018-09-27 MED ORDER — FENTANYL CITRATE (PF) 250 MCG/5ML IJ SOLN
INTRAMUSCULAR | Status: AC
  Filled 2018-09-27: qty 5

## 2018-09-27 MED ORDER — SODIUM CHLORIDE 0.9 % IV SOLN
INTRAVENOUS | Status: DC
  Administered 2018-09-27: 14:00:00 via INTRAVENOUS

## 2018-09-27 MED ORDER — CLOPIDOGREL BISULFATE 75 MG PO TABS: 75.0000 mg | ORAL_TABLET | Freq: Every day | ORAL | Status: DC

## 2018-09-27 MED ORDER — SODIUM CHLORIDE 0.9 % IV SOLN
INTRAVENOUS | Status: DC
  Administered 2018-09-27 (×2): via INTRAVENOUS

## 2018-09-27 MED ORDER — ACETAMINOPHEN 500 MG PO TABS
500.0000 mg | ORAL_TABLET | Freq: Three times a day (TID) | ORAL | Status: DC
  Administered 2018-09-28 – 2018-09-29 (×4): 500 mg via ORAL
  Filled 2018-09-27 (×5): qty 1

## 2018-09-27 MED ORDER — IODIXANOL 320 MG/ML IV SOLN
INTRAVENOUS | Status: DC | PRN
  Administered 2018-09-27: 250 mL via INTRA_ARTERIAL

## 2018-09-27 MED ORDER — NITROGLYCERIN 1 MG/10 ML FOR IR/CATH LAB
INTRA_ARTERIAL | Status: AC
  Filled 2018-09-27: qty 10

## 2018-09-27 MED ORDER — SODIUM CHLORIDE 0.9% FLUSH
3.0000 mL | Freq: Two times a day (BID) | INTRAVENOUS | Status: DC
  Administered 2018-09-28 – 2018-09-29 (×3): 3 mL via INTRAVENOUS

## 2018-09-27 MED ORDER — DEXAMETHASONE SODIUM PHOSPHATE 10 MG/ML IJ SOLN
INTRAMUSCULAR | Status: AC
  Filled 2018-09-27: qty 1

## 2018-09-27 MED ORDER — TRAMADOL HCL 50 MG PO TABS
25.0000 mg | ORAL_TABLET | Freq: Four times a day (QID) | ORAL | Status: DC | PRN
  Administered 2018-09-29: 25 mg via ORAL
  Filled 2018-09-27: qty 1

## 2018-09-27 MED ORDER — SUCCINYLCHOLINE CHLORIDE 200 MG/10ML IV SOSY
PREFILLED_SYRINGE | INTRAVENOUS | Status: DC | PRN
  Administered 2018-09-27: 80 mg via INTRAVENOUS

## 2018-09-27 MED ORDER — HEPARIN (PORCINE) 25000 UT/250ML-% IV SOLN
400.0000 [IU]/h | INTRAVENOUS | Status: DC
  Administered 2018-09-27: 400 [IU]/h via INTRAVENOUS
  Filled 2018-09-27: qty 250

## 2018-09-27 MED ORDER — PHENOL 1.4 % MT LIQD: 1.0000 | OROMUCOSAL | Status: DC | PRN

## 2018-09-27 MED ORDER — PRAVASTATIN SODIUM 40 MG PO TABS
40.0000 mg | ORAL_TABLET | Freq: Every day | ORAL | Status: DC
  Administered 2018-09-28: 40 mg via ORAL
  Filled 2018-09-27: qty 1

## 2018-09-27 MED ORDER — PANTOPRAZOLE SODIUM 40 MG PO TBEC
40.0000 mg | DELAYED_RELEASE_TABLET | Freq: Every day | ORAL | Status: DC
  Administered 2018-09-28 – 2018-09-29 (×2): 40 mg via ORAL
  Filled 2018-09-27 (×2): qty 1

## 2018-09-27 MED ORDER — LIDOCAINE 2% (20 MG/ML) 5 ML SYRINGE
INTRAMUSCULAR | Status: DC | PRN
  Administered 2018-09-27: 60 mg via INTRAVENOUS

## 2018-09-27 MED ORDER — SODIUM CHLORIDE 0.9 % IV SOLN
INTRAVENOUS | Status: DC | PRN
  Administered 2018-09-27: 500 mL

## 2018-09-27 MED ORDER — FENTANYL CITRATE (PF) 100 MCG/2ML IJ SOLN
INTRAMUSCULAR | Status: DC | PRN
  Administered 2018-09-27: 50 ug via INTRAVENOUS
  Administered 2018-09-27 (×3): 25 ug via INTRAVENOUS

## 2018-09-27 MED ORDER — FAMOTIDINE IN NACL 20-0.9 MG/50ML-% IV SOLN
INTRAVENOUS | Status: AC
  Filled 2018-09-27: qty 50

## 2018-09-27 SURGICAL SUPPLY — 59 items
ADH SKN CLS APL DERMABOND .7 (GAUZE/BANDAGES/DRESSINGS) ×2
ADH SKN CLS LQ APL DERMABOND (GAUZE/BANDAGES/DRESSINGS) ×2
BANDAGE ESMARK 6X9 LF (GAUZE/BANDAGES/DRESSINGS) IMPLANT
BNDG CMPR 9X6 STRL LF SNTH (GAUZE/BANDAGES/DRESSINGS)
BNDG ESMARK 6X9 LF (GAUZE/BANDAGES/DRESSINGS)
CANISTER SUCT 3000ML PPV (MISCELLANEOUS) ×4 IMPLANT
CANNULA VESSEL 3MM 2 BLNT TIP (CANNULA) ×8 IMPLANT
CATH EMB 4FR 80CM (CATHETERS) ×2 IMPLANT
CLIP VESOCCLUDE MED 24/CT (CLIP) ×4 IMPLANT
CLIP VESOCCLUDE SM WIDE 24/CT (CLIP) ×4 IMPLANT
COVER PROBE W GEL 5X96 (DRAPES) IMPLANT
COVER WAND RF STERILE (DRAPES) ×2 IMPLANT
CUFF TOURNIQUET SINGLE 24IN (TOURNIQUET CUFF) IMPLANT
CUFF TOURNIQUET SINGLE 34IN LL (TOURNIQUET CUFF) IMPLANT
CUFF TOURNIQUET SINGLE 44IN (TOURNIQUET CUFF) IMPLANT
DERMABOND ADHESIVE PROPEN (GAUZE/BANDAGES/DRESSINGS) ×2
DERMABOND ADVANCED (GAUZE/BANDAGES/DRESSINGS) ×2
DERMABOND ADVANCED .7 DNX12 (GAUZE/BANDAGES/DRESSINGS) ×2 IMPLANT
DERMABOND ADVANCED .7 DNX6 (GAUZE/BANDAGES/DRESSINGS) IMPLANT
DRAIN CHANNEL 15F RND FF W/TCR (WOUND CARE) ×2 IMPLANT
DRAPE HALF SHEET 40X57 (DRAPES) IMPLANT
DRAPE X-RAY CASS 24X20 (DRAPES) IMPLANT
ELECT REM PT RETURN 9FT ADLT (ELECTROSURGICAL) ×4
ELECTRODE REM PT RTRN 9FT ADLT (ELECTROSURGICAL) ×2 IMPLANT
EVACUATOR SILICONE 100CC (DRAIN) ×2 IMPLANT
GAUZE SPONGE 4X4 12PLY STRL (GAUZE/BANDAGES/DRESSINGS) ×2 IMPLANT
GLOVE BIO SURGEON STRL SZ7.5 (GLOVE) ×4 IMPLANT
GLOVE BIOGEL PI IND STRL 8 (GLOVE) ×2 IMPLANT
GLOVE BIOGEL PI INDICATOR 8 (GLOVE) ×2
GOWN STRL REUS W/ TWL LRG LVL3 (GOWN DISPOSABLE) ×6 IMPLANT
GOWN STRL REUS W/TWL LRG LVL3 (GOWN DISPOSABLE) ×12
KIT BASIN OR (CUSTOM PROCEDURE TRAY) ×4 IMPLANT
KIT TURNOVER KIT B (KITS) ×4 IMPLANT
MARKER GRAFT CORONARY BYPASS (MISCELLANEOUS) IMPLANT
NS IRRIG 1000ML POUR BTL (IV SOLUTION) ×8 IMPLANT
PACK PERIPHERAL VASCULAR (CUSTOM PROCEDURE TRAY) ×4 IMPLANT
PAD ARMBOARD 7.5X6 YLW CONV (MISCELLANEOUS) ×8 IMPLANT
PATCH VASC XENOSURE 1CMX6CM (Vascular Products) ×4 IMPLANT
PATCH VASC XENOSURE 1X6 (Vascular Products) IMPLANT
SET COLLECT BLD 21X3/4 12 (NEEDLE) IMPLANT
SPONGE SURGIFOAM ABS GEL 100 (HEMOSTASIS) IMPLANT
STOPCOCK 4 WAY LG BORE MALE ST (IV SETS) ×2 IMPLANT
SUT ETHILON 3 0 PS 1 (SUTURE) ×2 IMPLANT
SUT PROLENE 5 0 C 1 24 (SUTURE) ×2 IMPLANT
SUT PROLENE 6 0 BV (SUTURE) ×8 IMPLANT
SUT SILK 2 0 SH (SUTURE) ×2 IMPLANT
SUT SILK 3 0 (SUTURE)
SUT SILK 3-0 18XBRD TIE 12 (SUTURE) IMPLANT
SUT VIC AB 2-0 CTB1 (SUTURE) ×6 IMPLANT
SUT VIC AB 3-0 SH 27 (SUTURE) ×4
SUT VIC AB 3-0 SH 27X BRD (SUTURE) ×4 IMPLANT
SUT VICRYL 4-0 PS2 18IN ABS (SUTURE) ×6 IMPLANT
SYRINGE 3CC LL L/F (MISCELLANEOUS) ×2 IMPLANT
TAPE CLOTH SURG 4X10 WHT LF (GAUZE/BANDAGES/DRESSINGS) ×2 IMPLANT
TOWEL GREEN STERILE (TOWEL DISPOSABLE) ×4 IMPLANT
TRAY FOLEY MTR SLVR 16FR STAT (SET/KITS/TRAYS/PACK) ×6 IMPLANT
TUBING EXTENTION W/L.L. (IV SETS) IMPLANT
UNDERPAD 30X30 (UNDERPADS AND DIAPERS) ×4 IMPLANT
WATER STERILE IRR 1000ML POUR (IV SOLUTION) ×4 IMPLANT

## 2018-09-27 SURGICAL SUPPLY — 36 items
BALLN MUSTANG 5X200X135 (BALLOONS) ×3
BALLOON MUSTANG 5X200X135 (BALLOONS) IMPLANT
CATH CROSS OVER TEMPO 5F (CATHETERS) ×1 IMPLANT
CATH CXI 2.3F 135 ST (CATHETERS) ×1 IMPLANT
CATH OMNI FLUSH 5F 65CM (CATHETERS) ×1 IMPLANT
CATH SOFT-VU 4F 65 STRAIGHT (CATHETERS) IMPLANT
CATH SOFT-VU ST 4F 90CM (CATHETERS) ×1 IMPLANT
CATH SOFT-VU STRAIGHT 4F 65CM (CATHETERS) ×3
CLOSURE MYNX CONTROL 6F/7F (Vascular Products) ×1 IMPLANT
DEVICE ONE SNARE 10MM (MISCELLANEOUS) ×1 IMPLANT
DEVICE TORQUE .025-.038 (MISCELLANEOUS) ×1 IMPLANT
GLIDEWIRE ADV .035X260CM (WIRE) ×1 IMPLANT
GUIDEWIRE ASTATO XS 20G 300CM (WIRE) ×1 IMPLANT
KIT ENCORE 26 ADVANTAGE (KITS) ×1 IMPLANT
KIT MICROPUNCTURE NIT STIFF (SHEATH) ×2 IMPLANT
KIT PV (KITS) ×3 IMPLANT
PATCH THROMBIX TOPICAL PLAIN (HEMOSTASIS) ×1 IMPLANT
SHEATH FLEXOR ANSEL 1 7F 45CM (SHEATH) ×1 IMPLANT
SHEATH MICROPUNCTURE PEDAL 5FR (SHEATH) ×1 IMPLANT
SHEATH PINNACLE 5F 10CM (SHEATH) ×1 IMPLANT
SHEATH PINNACLE 7F 10CM (SHEATH) ×1 IMPLANT
SHEATH PROBE COVER 6X72 (BAG) ×1 IMPLANT
STENT INNOVA 5X120X130 (Permanent Stent) ×1 IMPLANT
STENT INNOVA 5X150X130 (Permanent Stent) ×1 IMPLANT
STENT INNOVA 5X60X130 (Permanent Stent) ×1 IMPLANT
STOPCOCK MORSE 400PSI 3WAY (MISCELLANEOUS) ×1 IMPLANT
SYR MEDRAD MARK 7 150ML (SYRINGE) ×3 IMPLANT
TAPE VIPERTRACK RADIOPAQ (MISCELLANEOUS) IMPLANT
TAPE VIPERTRACK RADIOPAQUE (MISCELLANEOUS) ×3
TRANSDUCER W/STOPCOCK (MISCELLANEOUS) ×3 IMPLANT
TRAY PV CATH (CUSTOM PROCEDURE TRAY) ×3 IMPLANT
TUBING CIL FLEX 10 FLL-RA (TUBING) ×1 IMPLANT
WIRE BENTSON .035X145CM (WIRE) ×1 IMPLANT
WIRE HI TORQ COMMND ES.014X300 (WIRE) ×1 IMPLANT
WIRE HI TORQ VERSACORE J 260CM (WIRE) ×1 IMPLANT
WIRE SPARTACORE .014X300CM (WIRE) ×2 IMPLANT

## 2018-09-27 NOTE — Progress Notes (Signed)
Report received from [place]. Will access patient when she comes back.

## 2018-09-27 NOTE — Transfer of Care (Signed)
Immediate Anesthesia Transfer of Care Note  Patient: [name]  Procedure(s) Performed: left FEMORAL THROMBECTOMY (Left Leg Upper) Patch Angioplasty of left femoral artery using xenosure bovine pericardium patch (Left Groin) left Endarterectomy Femoral (Left Groin)  Patient Location: PACU  Anesthesia Type:General  Level of Consciousness: awake, drowsy and patient cooperative  Airway & Oxygen Therapy: Patient Spontanous Breathing and Patient connected to face mask oxygen  Post-op Assessment: Report given to RN, Post -op Vital signs reviewed and stable and Patient moving all extremities X 4  Post vital signs: Reviewed and stable  Last Vitals:  Vitals Value Taken Time  BP 145/93 09/27/2018  6:46 PM  Temp    Pulse 91 09/27/2018  6:50 PM  Resp 26 09/27/2018  6:50 PM  SpO2 100 % 09/27/2018  6:50 PM  Vitals shown include unvalidated device data.  Last Pain:  Vitals:   09/27/18 1605  TempSrc: Oral  PainSc:          Complications: No apparent anesthesia complications

## 2018-09-27 NOTE — Anesthesia Procedure Notes (Signed)
Procedure Name: Intubation Date/Time: 09/27/2018 4:50 PM Performed by: Moshe Salisbury, CRNA Pre-anesthesia Checklist: Patient identified, Emergency Drugs available, Suction available and Patient being monitored Patient Re-evaluated:Patient Re-evaluated prior to induction Oxygen Delivery Method: Circle System Utilized Preoxygenation: Pre-oxygenation with 100% oxygen Induction Type: IV induction, Rapid sequence and Cricoid Pressure applied Laryngoscope Size: Mac and 3 Grade View: Grade II Tube type: Oral Tube size: 7.5 mm Number of attempts: 1 Airway Equipment and Method: Stylet Placement Confirmation: ETT inserted through vocal cords under direct vision,  positive ETCO2 and breath sounds checked- equal and bilateral Secured at: 21 cm Tube secured with: Tape Dental Injury: Teeth and Oropharynx as per pre-operative assessment

## 2018-09-27 NOTE — H&P (Signed)
[name] is an 85 y.o. female.   Chief Complaint: Nonhealing wound  HPI:   85 y.o. Caucasian female  with hypertension, hyperlipidemia, now with left heel ulceration and abnormal LE arterial duplex suggesting Lt prox SFA stenosis. She is here today for LE angiogram and intervention.   Past Medical History:  Diagnosis Date  . Adenomatous colon polyp 2007  . Arthritis   . Bilateral carotid bruits 07/27/2018  . CAD (coronary artery disease)   . Chronic back pain   . Gall stone pancreatitis   . Heart murmur   . High cholesterol   . Hx: UTI (urinary tract infection)    Now infrequent  . Hypertension   . Left knee pain Nov. 18, 2014  . Onychomycosis   . Osteoarthritis   . Osteopenia   . PAD (peripheral artery disease) ([place])   . PVD (peripheral vascular disease) ([place])   . Skin ulcer of left heel ([place])   . Trigger finger, right   . Vertigo     Past Surgical History:  Procedure Laterality Date  . ABDOMINAL HYSTERECTOMY     partial  . CHOLECYSTECTOMY  10/30/2011   Procedure: LAPAROSCOPIC CHOLECYSTECTOMY WITH INTRAOPERATIVE CHOLANGIOGRAM;  Surgeon: Zenovia Jarred, MD;  Location: [facility];  Service: General;  Laterality: N/A;  . EUS  11/11/2011   Procedure: ESOPHAGEAL ENDOSCOPIC ULTRASOUND (EUS) RADIAL;  Surgeon: Arta Silence, MD;  Location: [facility];  Service: Endoscopy;  Laterality: N/A;  possible ERCP  . JOINT REPLACEMENT Left 06-22-06   Hip  . JOINT REPLACEMENT Right 11-26-06   Shoulder  . JOINT REPLACEMENT Right 02-27-06   Knee  . TOTAL HIP ARTHROPLASTY    . TOTAL KNEE ARTHROPLASTY    . TOTAL SHOULDER ARTHROPLASTY      Family History  Problem Relation Age of Onset  . Cancer Mother        ovarian  . Pneumonia Father   . Heart attack Brother   . Heart disease Brother        Heart Disease before age 63   Social History:  reports that she has never smoked. She has never used smokeless tobacco. She reports that she does not drink alcohol or use  drugs.  Allergies:  Allergies  Allergen Reactions  . Hydrochlorothiazide Other (See Comments)    Dropped sodium level  . Penicillins     Unknown  Did it involve swelling of the face/tongue/throat, SOB, or low BP? Unknown Did it involve sudden or severe rash/hives, skin peeling, or any reaction on the inside of your mouth or nose? Unknown Did you need to seek medical attention at a hospital or doctor's office? Unknown When did it last happen?unknown If all above answers are "NO", may proceed with cephalosporin use.   . Sulfa Antibiotics     unknown    Review of Systems  Constitution: Negative for decreased appetite, malaise/fatigue, weight gain and weight loss.  HENT: Negative for congestion.   Eyes: Negative for visual disturbance.  Cardiovascular: Positive for palpitations. Negative for chest pain, dyspnea on exertion, leg swelling and syncope.       LLE ulcer   Respiratory: Negative for cough.   Endocrine: Negative for cold intolerance.  Hematologic/Lymphatic: Does not bruise/bleed easily.  Skin: Negative for itching and rash.  Musculoskeletal: Positive for back pain. Negative for myalgias.  Gastrointestinal: Negative for abdominal pain, nausea and vomiting.  Genitourinary: Negative for dysuria.  Neurological: Negative for dizziness and weakness.  Psychiatric/Behavioral: The patient is not nervous/anxious.  All other systems reviewed and are negative.    Blood pressure (!) 187/83, pulse 81, temperature (!) 97.4 F (36.3 C), temperature source Oral, resp. rate 16, height 5\' 4"  (1.626 m), weight 62.1 kg, SpO2 97 %. Body mass index is 23.52 kg/m.  Physical Exam  Constitutional: She is oriented to person, place, and time. She appears well-developed and well-nourished. No distress.  HENT:  Head: Normocephalic and atraumatic.  Eyes: Pupils are equal, round, and reactive to light. Conjunctivae are normal.  Neck: No JVD present.  Cardiovascular: Normal rate and  regular rhythm.  LLE ulcer   Pulmonary/Chest: Effort normal and breath sounds normal. She has no wheezes. She has no rales.  Abdominal: Soft. Bowel sounds are normal. There is no rebound.  Musculoskeletal:        General: No edema.  Lymphadenopathy:    She has no cervical adenopathy.  Neurological: She is alert and oriented to person, place, and time. No cranial nerve deficit.  Skin: Skin is warm and dry.  Psychiatric: She has a normal mood and affect.  Nursing note and vitals reviewed.   No results found for this or any previous visit (from the past 48 hour(s)).  Labs:   Lab Results  Component Value Date   WBC 6.7 09/19/2018   HGB 12.4 09/19/2018   HCT 37.2 09/19/2018   MCV 96 09/19/2018   PLT 255 09/19/2018   No results for input(s): NA, K, CL, CO2, BUN, CREATININE, CALCIUM, PROT, BILITOT, ALKPHOS, ALT, AST, GLUCOSE in the last 168 hours.  Invalid input(s): LABALBU  Lipid Panel  No results found for: CHOL, TRIG, HDL, CHOLHDL, VLDL, LDLCALC  BNP (last 3 results) No results for input(s): BNP in the last 8760 hours.  HEMOGLOBIN A1C No results found for: HGBA1C, MPG  Cardiac Panel (last 3 results) Recent Labs    12/25/17 1646  TROPONINI <0.03    Lab Results  Component Value Date   TROPONINI <0.03 12/25/2017     TSH No results for input(s): TSH in the last 8760 hours.   Medications Prior to Admission  Medication Sig Dispense Refill  . acetaminophen (TYLENOL) 650 MG CR tablet Take 650 mg by mouth 3 (three) times daily.    Marland Kitchen amLODipine (NORVASC) 5 MG tablet Take 5 mg by mouth daily.   12  . aspirin 81 MG tablet Take 81 mg by mouth daily.    . Carboxymethylcellulose Sodium (THERATEARS OP) Place 1 drop into the right eye daily as needed (dry eyes).    . cetirizine (ZYRTEC) 10 MG tablet Take 10 mg by mouth daily.    . cholecalciferol (VITAMIN D) 1000 UNITS tablet Take 1,000 Units by mouth daily.    . clopidogrel (PLAVIX) 75 MG tablet Take 1 tablet (75 mg total)  by mouth daily. (Patient taking differently: Take 75 mg by mouth at bedtime. ) 30 tablet 1  . diclofenac sodium (VOLTAREN) 1 % GEL Apply 1 application topically 4 (four) times daily as needed (arthritis pain).    Marland Kitchen losartan (COZAAR) 100 MG tablet Take 100 mg by mouth daily.    . Pitavastatin Calcium (LIVALO) 2 MG TABS Take 1 mg by mouth every evening.    . sertraline (ZOLOFT) 25 MG tablet Take 25 mg by mouth daily.    . traMADol (ULTRAM) 50 MG tablet Take 25 mg by mouth 3 (three) times daily.     . clindamycin (CLEOCIN) 150 MG capsule Take 600 mg by mouth See admin instructions. Take 600 mg 1  hour prior to dental work    . famotidine (PEPCID) 40 MG tablet Take 40 mg by mouth every evening.    . hydrALAZINE (APRESOLINE) 25 MG tablet Take 25 mg by mouth daily as needed (systolic bp goes over 355).     . meclizine (ANTIVERT) 12.5 MG tablet Take 12.5 mg by mouth 3 (three) times daily as needed for dizziness.    . Menthol (ICY HOT) 5 % PTCH Apply 1 patch topically daily as needed (pain).    . nystatin (MYCOSTATIN/NYSTOP) powder Apply 15 g topically daily as needed (rash).    Marland Kitchen PRESCRIPTION MEDICATION Apply 1 application topically 3 (three) times daily as needed (back pain). Baclofen 5%, Diclofenac 3%, Gabapentin 6% mix 1g with 2g of lidocaine        Current Facility-Administered Medications:  .  0.9 %  sodium chloride infusion, , Intravenous, Continuous, Adrian Prows, MD, Last Rate: 150 mL/hr at 09/27/18 0605 .  0.9 %  sodium chloride infusion, 250 mL, Intravenous, PRN, Adrian Prows, MD .  sodium chloride 0.9 % bolus 500 mL, 500 mL, Intravenous, Once, Adrian Prows, MD .  sodium chloride flush (NS) 0.9 % injection 3 mL, 3 mL, Intravenous, Q12H, Adrian Prows, MD .  sodium chloride flush (NS) 0.9 % injection 3 mL, 3 mL, Intravenous, PRN, Adrian Prows, MD   Today's Vitals   09/27/18 0538 09/27/18 0612  BP: (!) 187/83   Pulse: 81   Resp: 16   Temp: (!) 97.4 F (36.3 C)   TempSrc: Oral   SpO2: 97%    Weight: 62.1 kg   Height: 5\' 4"  (1.626 m)   PainSc:  0-No pain   Body mass index is 23.52 kg/m.  Cardiac studies:  EKG 07/27/2018: Normal sinus rhythm at 77 bpm, left atrial abnormality, normal axis, no evidence of ischemia.   Lower Extremity Arterial Duplex 09/09/2018 No hemodynamically significant stenoses are identified in the right lower extremity arterial system. There is diffuse heterogeneous plaque in the right lower extremity. Left mid SFA shows monophasic waveform with suggestion of high grade stenosis in the proximal SFA.  This exam reveals normal perfusion of the right lower extremity (ABI 1.09) and normal perfusion of the left lower extremity (ABI 1.02).  Modetately abnormal waveform right ankle and severely abnormal waveform left at the ankle. ABI can be falsely elevated in diabetic patients with arteriosclerosis. Clinical correlation recommended.  Assessment: 85 y.o. Caucasian female  with hypertension, hyperlipidemia, now with left heel ulceration and abnormal LE arterial duplex  Plan: LE angiogram and intervention  Nigel Mormon, MD 09/27/2018, 7:31 AM [facility] Pager: [phone] Office: [phone] If no answer: [phone]

## 2018-09-27 NOTE — Op Note (Signed)
NAME: [name]    MRN: [id number] DOB: 03-Jun-1930    DATE OF OPERATION: 09/27/2018  PREOP DIAGNOSIS:    Ischemic left lower extremity  POSTOP DIAGNOSIS:    Same  PROCEDURE:    Left femoral thrombectomy Left femoral endarterectomy with bovine pericardial patch angioplasty  SURGEON: Judeth Cornfield. Scot Dock, MD, FACS  ASSIST: Leontine Locket, PA  ANESTHESIA: General  EBL: 75 cc  INDICATIONS:    [name] is a 85 y.o. female who had undergone a complex endovascular intervention earlier today.  There was some concern about dissection or thrombus in the left common femoral artery.  Initially she had good Doppler signals in the left foot when I saw her late this afternoon she had no Doppler flow in the foot or in the popliteal artery.  I recommended urgent exploration.  FINDINGS:   The stents were patent.  Minimal thrombus was retrieved from the superficial femoral artery.  There was extensive plaque in the common femoral artery which had dissected and this was endarterectomized and repaired with a bovine pericardial patch.  At the completion there were excellent signals in the posterior tibial, anterior tibial, and peroneal arteries with the Doppler.  Given that there were excellent signals, and that she did receive significant contrast earlier I did not do an intraoperative arteriogram given her history of stage III chronic kidney disease.  TECHNIQUE:   The patient was taken to the operating room and received a general anesthetic.  The right groin lower abdomen and entire left lower extremity were prepped and draped in usual sterile fashion.  A longitudinal incision was made in the left groin.  The dissection was carried down to the common femoral artery which was significantly calcified.  I dissected up higher onto the external iliac artery where I could place a clamp.  There was a pulse at this level.  Next I dissected out the superficial femoral artery which had a  stent up to the origin.  The deep femoral artery was also controlled.  There was minimal flow in the superficial femoral artery proximally.  The patient was then heparinized.  The deep femoral artery was controlled and the common femoral artery was clamped.  A longitudinal arteriotomy was made in the common femoral artery.  This was extended down to the origin of the superficial femoral artery and I could identify the stent and a 4 Fogarty catheter was placed through the stent and the balloon inflated for distal control.  There was extensive plaque which was calcified in the common femoral artery and this had dissected.  This was endarterectomized and removed.  This was over a length of approximately 5 to 6 cm.  There was a nice endpoint at the origin of the deep femoral artery and superficial femoral artery.  I did pass the Fogarty catheter its entire length down the superficial femoral artery through the stent and pulled it back very gently so as to not dislodge the stents and minimal clot was retrieved.  A bovine pericardial patch was then sewn using continuous 6-0 Prolene suture.  Prior to completing this closure the arteries were backbled and flushed and there was excellent inflow.  The anastomosis was completed and flow reestablished to the left leg.  At this point there was a biphasic posterior tibial, anterior tibial, and peroneal signal with the Doppler.  I did not reverse her heparin.  A 15 Blake drain was placed.  The wound was closed with 2 deep layers of  2-0 Vicryl, a 3-0 Vicryl, and a 4-0 Vicryl.  Dermabond was applied.  The patient tolerated the procedure well was transferred to the recovery room in stable condition.  All needle and sponge counts were correct.  Deitra Mayo, MD, FACS [facility]  DATE OF DICTATION:   09/27/2018

## 2018-09-27 NOTE — Anesthesia Preprocedure Evaluation (Addendum)
Anesthesia Evaluation  Patient identified by MRN, date of birth, ID band Patient awake    Reviewed: Allergy & Precautions, H&P , NPO status , Patient's Chart, lab work & pertinent test results  Airway Mallampati: II  TM Distance: >3 FB Neck ROM: Full    Dental no notable dental hx. (+) Teeth Intact, Dental Advisory Given   Pulmonary neg pulmonary ROS,    Pulmonary exam normal breath sounds clear to auscultation       Cardiovascular hypertension, Pt. on medications + Peripheral Vascular Disease   Rhythm:Regular Rate:Normal     Neuro/Psych negative neurological ROS  negative psych ROS   GI/Hepatic negative GI ROS, Neg liver ROS,   Endo/Other  negative endocrine ROS  Renal/GU negative Renal ROS  negative genitourinary   Musculoskeletal  (+) Arthritis , Osteoarthritis,    Abdominal   Peds  Hematology negative hematology ROS (+)   Anesthesia Other Findings   Reproductive/Obstetrics negative OB ROS                            Anesthesia Physical Anesthesia Plan  ASA: III and emergent  Anesthesia Plan: General   Post-op Pain Management:    Induction: Intravenous, Rapid sequence and Cricoid pressure planned  PONV Risk Score and Plan: 4 or greater and Ondansetron, Dexamethasone and Treatment may vary due to age or medical condition  Airway Management Planned: Oral ETT  Additional Equipment:   Intra-op Plan:   Post-operative Plan: Extubation in OR  Informed Consent: I have reviewed the patients History and Physical, chart, labs and discussed the procedure including the risks, benefits and alternatives for the proposed anesthesia with the patient or authorized representative who has indicated his/her understanding and acceptance.     Dental advisory given  Plan Discussed with: CRNA  Anesthesia Plan Comments:         Anesthesia Quick Evaluation

## 2018-09-27 NOTE — Progress Notes (Signed)
Pt received from cath lab. VSS. RG closed w/ mynx level 0. L DP level 0. Pt oriented to room and unit. Lunch tray ordered. Will continue to monitor.  Clyde Canterbury, RN

## 2018-09-27 NOTE — Interval H&P Note (Signed)
History and Physical Interval Note:  09/27/2018 8:01 AM  [name]  has presented today for surgery, with the diagnosis of non healing ulcer.  The various methods of treatment have been discussed with the patient and family. After consideration of risks, benefits and other options for treatment, the patient has consented to  Procedure(s): LOWER EXTREMITY ANGIOGRAPHY (N/A) as a surgical intervention.  The patient's history has been reviewed, patient examined, no change in status, stable for surgery.  I have reviewed the patient's chart and labs.  Questions were answered to the patient's satisfaction.     [place]

## 2018-09-27 NOTE — Progress Notes (Signed)
REASON FOR CONSULT:    Possible left common femoral artery dissection.  The consult is requested by Dr. Einar Gip.  ASSESSMENT & PLAN:   ISCHEMIC LEFT LOWER EXTREMITY: This patient underwent a complex in the vascular intervention earlier today.  The patient developed a possible dissection versus thrombus in the left common femoral artery that improved with balloon angioplasty.  I was asked to review the films.  However on my exam the patient has no Doppler signals in the left foot and no popliteal signal with the Doppler.  She does have a palpable left femoral pulse.,  Thus I suspect that she has occluded her left superficial femoral artery stents.  I have recommended emergent left femoral thrombectomy, possible bypass, and possible fasciotomies.  If there is an issue with inflow on the left there is a small chance she would need a right to left femorofemoral bypass.  I have discussed the indications for the procedure and the potential complications with with the patient.  I have also discussed the situation with the daughter and Dr. Einar Gip.  I think without attempted revascularization she is at risk for limb loss.  We will proceed urgently.  Deitra Mayo, MD, FACS [facility] [phone] Office: [phone]   HPI:   [name] is a pleasant 85 y.o. female, who tells me that she has had a wound on her left heel for 2 years.  She was being worked on at the wound care center and ultimately was felt that she had infrainguinal arterial occlusive disease and presented for arteriography and possible intervention today.  The patient had an occlusion at the origin of the left superficial femoral artery with reconstitution of the distal superficial femoral artery.  Via a right femoral approach a retrograde intervention was not possible and therefore the left anterior tibial artery was cannulated and a an antegrade intervention was performed.  The superficial femoral artery was stented from the origin to the  distal superficial femoral artery.  At the completion there was noted to be possible dissection versus thrombus in the left common femoral artery.  This was addressed with balloon angioplasty and follow-up films did shows resolution of this for the most part.  Distal films after that did not show any evidence of embolic disease.  Currently she is not complaining of severe foot pain.  Prior to this the patient stated that she would walk quite a bit and up to 2 to 3 miles.  She denied any history of claudication or rest pain although she is somewhat of a poor historian.  Past Medical History:  Diagnosis Date  . Adenomatous colon polyp 2007  . Arthritis   . Bilateral carotid bruits 07/27/2018  . CAD (coronary artery disease)   . Chronic back pain   . Gall stone pancreatitis   . Heart murmur   . High cholesterol   . Hx: UTI (urinary tract infection)    Now infrequent  . Hypertension   . Left knee pain Nov. 18, 2014  . Onychomycosis   . Osteoarthritis   . Osteopenia   . PAD (peripheral artery disease) ([place])   . PVD (peripheral vascular disease) ([place])   . Skin ulcer of left heel ([place])   . Trigger finger, right   . Vertigo     Family History  Problem Relation Age of Onset  . Cancer Mother        ovarian  . Pneumonia Father   . Heart attack Brother   . Heart disease Brother  Heart Disease before age 73    SOCIAL HISTORY: Social History   Socioeconomic History  . Marital status: Widowed    Spouse name: Not on file  . Number of children: 6  . Years of education: College  . Highest education level: Not on file  Occupational History  . Not on file  Social Needs  . Financial resource strain: Not on file  . Food insecurity:    Worry: Not on file    Inability: Not on file  . Transportation needs:    Medical: Not on file    Non-medical: Not on file  Tobacco Use  . Smoking status: Never Smoker  . Smokeless tobacco: Never Used  Substance and Sexual Activity  . Alcohol  use: No    Alcohol/week: 0.0 standard drinks  . Drug use: No  . Sexual activity: Not on file  Lifestyle  . Physical activity:    Days per week: Not on file    Minutes per session: Not on file  . Stress: Not on file  Relationships  . Social connections:    Talks on phone: Not on file    Gets together: Not on file    Attends religious service: Not on file    Active member of club or organization: Not on file    Attends meetings of clubs or organizations: Not on file    Relationship status: Not on file  . Intimate partner violence:    Fear of current or ex partner: Not on file    Emotionally abused: Not on file    Physically abused: Not on file    Forced sexual activity: Not on file  Other Topics Concern  . Not on file  Social History Narrative   No caffeine use    Allergies  Allergen Reactions  . Hydrochlorothiazide Other (See Comments)    Dropped sodium level  . Penicillins     Unknown  Did it involve swelling of the face/tongue/throat, SOB, or low BP? Unknown Did it involve sudden or severe rash/hives, skin peeling, or any reaction on the inside of your mouth or nose? Unknown Did you need to seek medical attention at a hospital or doctor's office? Unknown When did it last happen?unknown If all above answers are "NO", may proceed with cephalosporin use.   . Sulfa Antibiotics     unknown    Current Facility-Administered Medications  Medication Dose Route Frequency Provider Last Rate Last Dose  . 0.9 %  sodium chloride infusion  250 mL Intravenous PRN Adrian Prows, MD      . 0.9 %  sodium chloride infusion   Intravenous Continuous Patwardhan, Manish J, MD 100 mL/hr at 09/27/18 1500    . 0.9 %  sodium chloride infusion  250 mL Intravenous PRN Patwardhan, Manish J, MD      . acetaminophen (TYLENOL) tablet 500 mg  500 mg Oral TID Patwardhan, Manish J, MD      . acetaminophen (TYLENOL) tablet 650 mg  650 mg Oral Q4H PRN Patwardhan, Reynold Bowen, MD      . Derrill Memo ON  09/28/2018] aspirin EC tablet 81 mg  81 mg Oral Daily Patwardhan, Manish J, MD      . Derrill Memo ON 09/28/2018] clopidogrel (PLAVIX) tablet 75 mg  75 mg Oral Q breakfast Patwardhan, Manish J, MD      . famotidine (PEPCID) tablet 40 mg  40 mg Oral QPM Patwardhan, Manish J, MD      . hydrALAZINE (APRESOLINE) injection 5 mg  5 mg Intravenous Q20 Min PRN Patwardhan, Manish J, MD      . hydrALAZINE (APRESOLINE) tablet 25 mg  25 mg Oral Daily PRN Patwardhan, Manish J, MD      . labetalol (NORMODYNE) injection 10 mg  10 mg Intravenous Q10 min PRN Patwardhan, Manish J, MD      . meclizine (ANTIVERT) tablet 12.5 mg  12.5 mg Oral TID PRN Patwardhan, Manish J, MD      . ondansetron (ZOFRAN) injection 4 mg  4 mg Intravenous Q6H PRN Patwardhan, Manish J, MD      . polyvinyl alcohol (LIQUIFILM TEARS) 1.4 % ophthalmic solution 1 drop  1 drop Right Eye PRN Patwardhan, Manish J, MD      . pravastatin (PRAVACHOL) tablet 40 mg  40 mg Oral q1800 Patwardhan, Manish J, MD      . Derrill Memo ON 09/28/2018] sertraline (ZOLOFT) tablet 25 mg  25 mg Oral Daily Patwardhan, Manish J, MD      . sodium chloride 0.9 % bolus 500 mL  500 mL Intravenous Once Adrian Prows, MD      . sodium chloride flush (NS) 0.9 % injection 3 mL  3 mL Intravenous Q12H Adrian Prows, MD      . sodium chloride flush (NS) 0.9 % injection 3 mL  3 mL Intravenous PRN Adrian Prows, MD      . sodium chloride flush (NS) 0.9 % injection 3 mL  3 mL Intravenous Q12H Patwardhan, Manish J, MD      . sodium chloride flush (NS) 0.9 % injection 3 mL  3 mL Intravenous PRN Patwardhan, Manish J, MD      . traMADol (ULTRAM) tablet 25 mg  25 mg Oral Q6H PRN Patwardhan, Reynold Bowen, MD      . Derrill Memo ON 09/28/2018] vancomycin (VANCOCIN) IVPB 1000 mg/200 mL premix  1,000 mg Intravenous To OR Angelia Mould, MD        REVIEW OF SYSTEMS:  [X]  denotes positive finding, [ ]  denotes negative finding Cardiac  Comments:  Chest pain or chest pressure:    Shortness of breath upon exertion:     Short of breath when lying flat:    Irregular heart rhythm:        Vascular    Pain in calf, thigh, or hip brought on by ambulation:    Pain in feet at night that wakes you up from your sleep:     Blood clot in your veins:    Leg swelling:         Pulmonary    Oxygen at home:    Productive cough:     Wheezing:         Neurologic    Sudden weakness in arms or legs:     Sudden numbness in arms or legs:     Sudden onset of difficulty speaking or slurred speech:    Temporary loss of vision in one eye:     Problems with dizziness:         Gastrointestinal    Blood in stool:     Vomited blood:         Genitourinary    Burning when urinating:     Blood in urine:        Psychiatric    Major depression:         Hematologic    Bleeding problems:    Problems with blood clotting too easily:        Skin  Rashes or ulcers:        Constitutional    Fever or chills:     PHYSICAL EXAM:   Vitals:   09/27/18 1220 09/27/18 1243 09/27/18 1300 09/27/18 1546  BP: 121/63 (!) 137/54 (!) 129/57 (!) 119/56  Pulse: 72  67 71  Resp: 16 10 18 13   Temp:  97.6 F (36.4 C) 98 F (36.7 C) 97.9 F (36.6 C)  TempSrc:  Oral  Oral  SpO2: 99%  92% 98%  Weight:      Height:        GENERAL: The patient is a well-nourished female, in no acute distress. The vital signs are documented above. CARDIAC: There is a regular rate and rhythm.  VASCULAR: She has soft bilateral carotid bruits. She has palpable femoral pulses. On the left side, which is the side of concern, I am unable to obtain an anterior tibial, posterior tibial, peroneal, or dorsalis pedis signal with the Doppler.  In addition I am unable to obtain a popliteal signal with the Doppler. The left foot is slightly cooler than the right although not mottled at this point. On the right side she has a biphasic dorsalis pedis and posterior tibial signal. PULMONARY: There is good air exchange bilaterally without wheezing or rales.  ABDOMEN: Soft and non-tender with normal pitched bowel sounds.  MUSCULOSKELETAL: There are no major deformities or cyanosis. NEUROLOGIC: No focal weakness or paresthesias are detected. SKIN: There are no ulcers or rashes noted. PSYCHIATRIC: The patient has a normal affect.  DATA:    ARTERIOGRAM: I have reviewed her arteriogram.  She had an occlusion of the left superficial femoral artery at the origin with reconstitution of the above-knee popliteal artery and three-vessel runoff.  Several long stents were placed which cover the entire superficial femoral artery.  She did have a filling defect in the common femoral artery which improved at the end of the case after balloon angioplasty.  It was not clear if this was a dissection or thrombus.  Follow-up films distally did not show any evidence of embolization.  There was some irregularity in the anterior tibial artery which may represent spasm given the retrograde cannulation.  I did not see any obvious evidence of thrombus although visualization of the distal tibials is limited.

## 2018-09-27 NOTE — Discharge Instructions (Signed)
 [facility]  Discharge instructions  Lower Extremity Surgery  Please refer to the following instruction for your post-procedure care. Your surgeon or physician assistant will discuss any changes with you.  Activity  You are encouraged to walk as much as you can. You can slowly return to normal activities during the month after your surgery. Avoid strenuous activity and heavy lifting until your doctor tells you it's OK. Avoid activities such as vacuuming or swinging a golf club. Do not drive until your doctor give the OK and you are no longer taking prescription pain medications. It is also normal to have difficulty with sleep habits, eating and bowel movement after surgery. These will go away with time.  Bathing/Showering  Shower daily after you go home. Do not soak in a bathtub, hot tub, or swim until the incision heals completely.  Incision Care  Clean your incision with mild soap and water. Shower every day. Pat the area dry with a clean towel. You do not need a bandage unless otherwise instructed. Do not apply any ointments or creams to your incision. If you have open wounds you will be instructed how to care for them or a visiting nurse may be arranged for you. If you have staples or sutures along your incision they will be removed at your post-op appointment. You may have skin glue on your incision. Do not peel it off. It will come off on its own in about one week.  Wash the groin wound with soap and water daily and pat dry. (No tub bath-only shower)  Then put a dry gauze or washcloth in the groin to keep this area dry to help prevent wound infection.  Do this daily and as needed.  Do not use Vaseline or neosporin on your incisions.  Only use soap and water on your incisions and then protect and keep dry.  Diet  Resume your normal diet. There are no special food restrictions following this procedure. A low fat/ low cholesterol diet is recommended for  all patients with vascular disease. In order to heal from your surgery, it is CRITICAL to get adequate nutrition. Your body requires vitamins, minerals, and protein. Vegetables are the best source of vitamins and minerals. Vegetables also provide the perfect balance of protein. Processed food has little nutritional value, so try to avoid this.  Medications  Resume taking all your medications unless your doctor or physician assistant tells you not to. If your incision is causing pain, you may take over-the-counter pain relievers such as acetaminophen (Tylenol). If you were prescribed a stronger pain medication, please aware these medication can cause nausea and constipation. Prevent nausea by taking the medication with a snack or meal. Avoid constipation by drinking plenty of fluids and eating foods with high amount of fiber, such as fruits, vegetables, and grains. Take Colace 100 mg (an over-the-counter stool softener) twice a day as needed for constipation.  Do not take Tylenol if you are taking prescription pain medications.  Follow Up  Our office will schedule a follow up appointment 2-3 weeks following discharge.  Please call us immediately for any of the following conditions  Severe or worsening pain in your legs or feet while at rest or while walking Increase pain, redness, warmth, or drainage (pus) from your incision site(s) Fever of 101 degree or higher The swelling in your leg with the bypass suddenly worsens and becomes more painful than when you were in the hospital If you have been   instructed to feel your graft pulse then you should do so every day. If you can no longer feel this pulse, call the office immediately. Not all patients are given this instruction.  Leg swelling is common after leg bypass surgery.  The swelling should improve over a few months following surgery. To improve the swelling, you may elevate your legs above the level of your heart while you are sitting or  resting. Your surgeon or physician assistant may ask you to apply an ACE wrap or wear compression (TED) stockings to help to reduce swelling.  Reduce your risk of vascular disease  Stop smoking. If you would like help call [facility] at 1-800-QUIT-NOW (1-800-784-8669) or [place] at 336-586-4000.  Manage your cholesterol Maintain a desired weight Control your diabetes weight Control your diabetes Keep your blood pressure down  If you have any questions, please call the office at [phone] 

## 2018-09-28 ENCOUNTER — Encounter (HOSPITAL_COMMUNITY): Payer: Self-pay | Admitting: Vascular Surgery

## 2018-09-28 ENCOUNTER — Other Ambulatory Visit: Payer: Self-pay | Admitting: *Deleted

## 2018-09-28 ENCOUNTER — Encounter (HOSPITAL_COMMUNITY): Payer: Medicare Other

## 2018-09-28 DIAGNOSIS — I70292 Other atherosclerosis of native arteries of extremities, left leg: Secondary | ICD-10-CM | POA: Diagnosis present

## 2018-09-28 DIAGNOSIS — M1990 Unspecified osteoarthritis, unspecified site: Secondary | ICD-10-CM | POA: Diagnosis present

## 2018-09-28 DIAGNOSIS — M1991 Primary osteoarthritis, unspecified site: Secondary | ICD-10-CM | POA: Diagnosis not present

## 2018-09-28 DIAGNOSIS — Z7982 Long term (current) use of aspirin: Secondary | ICD-10-CM | POA: Diagnosis not present

## 2018-09-28 DIAGNOSIS — Z9181 History of falling: Secondary | ICD-10-CM | POA: Diagnosis not present

## 2018-09-28 DIAGNOSIS — Z9889 Other specified postprocedural states: Secondary | ICD-10-CM | POA: Diagnosis not present

## 2018-09-28 DIAGNOSIS — I70248 Atherosclerosis of native arteries of left leg with ulceration of other part of lower left leg: Secondary | ICD-10-CM | POA: Diagnosis not present

## 2018-09-28 DIAGNOSIS — R2689 Other abnormalities of gait and mobility: Secondary | ICD-10-CM | POA: Diagnosis not present

## 2018-09-28 DIAGNOSIS — Z8249 Family history of ischemic heart disease and other diseases of the circulatory system: Secondary | ICD-10-CM | POA: Diagnosis not present

## 2018-09-28 DIAGNOSIS — E785 Hyperlipidemia, unspecified: Secondary | ICD-10-CM | POA: Diagnosis present

## 2018-09-28 DIAGNOSIS — I129 Hypertensive chronic kidney disease with stage 1 through stage 4 chronic kidney disease, or unspecified chronic kidney disease: Secondary | ICD-10-CM | POA: Diagnosis present

## 2018-09-28 DIAGNOSIS — N183 Chronic kidney disease, stage 3 (moderate): Secondary | ICD-10-CM | POA: Diagnosis present

## 2018-09-28 DIAGNOSIS — I998 Other disorder of circulatory system: Secondary | ICD-10-CM | POA: Diagnosis present

## 2018-09-28 DIAGNOSIS — F0390 Unspecified dementia without behavioral disturbance: Secondary | ICD-10-CM | POA: Diagnosis not present

## 2018-09-28 DIAGNOSIS — M6281 Muscle weakness (generalized): Secondary | ICD-10-CM | POA: Diagnosis not present

## 2018-09-28 DIAGNOSIS — I2510 Atherosclerotic heart disease of native coronary artery without angina pectoris: Secondary | ICD-10-CM | POA: Diagnosis present

## 2018-09-28 DIAGNOSIS — R2681 Unsteadiness on feet: Secondary | ICD-10-CM | POA: Diagnosis not present

## 2018-09-28 DIAGNOSIS — R41841 Cognitive communication deficit: Secondary | ICD-10-CM | POA: Diagnosis not present

## 2018-09-28 DIAGNOSIS — L97429 Non-pressure chronic ulcer of left heel and midfoot with unspecified severity: Secondary | ICD-10-CM | POA: Diagnosis present

## 2018-09-28 DIAGNOSIS — I7777 Dissection of artery of lower extremity: Secondary | ICD-10-CM | POA: Diagnosis present

## 2018-09-28 DIAGNOSIS — I739 Peripheral vascular disease, unspecified: Secondary | ICD-10-CM | POA: Diagnosis not present

## 2018-09-28 DIAGNOSIS — Z7902 Long term (current) use of antithrombotics/antiplatelets: Secondary | ICD-10-CM | POA: Diagnosis not present

## 2018-09-28 LAB — BASIC METABOLIC PANEL
Anion gap: 6 (ref 5–15)
BUN: 13 mg/dL (ref 8–23)
CO2: 27 mmol/L (ref 22–32)
Calcium: 8.2 mg/dL — ABNORMAL LOW (ref 8.9–10.3)
Chloride: 106 mmol/L (ref 98–111)
Creatinine, Ser: 0.86 mg/dL (ref 0.44–1.00)
GFR calc Af Amer: >60 mL/min (ref >60)
GFR calc non Af Amer: >60 mL/min (ref >60)
Glucose, Bld: 191 mg/dL — ABNORMAL HIGH (ref 70–99)
Potassium: 4.2 mmol/L (ref 3.5–5.1)
Sodium: 139 mmol/L (ref 135–145)

## 2018-09-28 LAB — CBC
HCT: 29.6 % — ABNORMAL LOW (ref 36.0–46.0)
Hemoglobin: 9.7 g/dL — ABNORMAL LOW (ref 12.0–15.0)
MCH: 32.2 pg (ref 26.0–34.0)
MCHC: 32.8 g/dL (ref 30.0–36.0)
MCV: 98.3 fL (ref 80.0–100.0)
Platelets: 208 10*3/uL (ref 150–400)
RBC: 3.01 MIL/uL — ABNORMAL LOW (ref 3.87–5.11)
RDW: 11.7 % (ref 11.5–15.5)
WBC: 10.9 10*3/uL — ABNORMAL HIGH (ref 4.0–10.5)
nRBC: 0 % (ref 0.0–0.2)

## 2018-09-28 MED ORDER — METOPROLOL TARTRATE 25 MG PO TABS
25.0000 mg | ORAL_TABLET | Freq: Two times a day (BID) | ORAL | Status: DC
  Administered 2018-09-28 – 2018-09-29 (×3): 25 mg via ORAL
  Filled 2018-09-28 (×3): qty 1

## 2018-09-28 NOTE — Progress Notes (Addendum)
Pt received from PACU at 2015. Pt very confused, wanted to dress up and go home. Reoriented . But pt was constantly trying to sit up, she stated " you will burn in hell for all illegal things you are doing to me". Pt connected to monitor. CCMD called and notified. Vitals stable. Right groin angiogram site had level 1 hematoma at the time of arrival. PACU RN stated it wasn't like that downstairs. Pressure held for 15-20 per PACU RN and me. Hematoma went away, pt had some oozing on the site. Site cleaned, 2x2 and transparent dressing applied. Charge Nurse aware and by bedside.  Left groin surgical site has skin glue intact. JP drain to left thigh dranning bright red blood. 50 cc removed and JP drain charged. Bilateral pedal pulses +3. Vitals stable . Bed alarm on. Will continue to monitor.

## 2018-09-28 NOTE — Evaluation (Signed)
Physical Therapy Evaluation Patient Details Name: [name] MRN: [id number] DOB: 09-15-1930 Today's Date: 09/28/2018   History of Present Illness  85 y.o.femalePMH including Arthritis, Bilateral carotid bruits (07/27/2018), CAD, Chronic back pain, Onychomycosis, Osteoarthritis, Osteopenia, PAD, PVD, Trigger finger-right, and Vertigo. Now with left heel ulceration, now s/p Left SFA stenting, followed by repair and endarterectomy of L CFA 05/12.   Clinical Impression  Pt admitted with above diagnosis. Pt currently with functional limitations due to the deficits listed below (see PT Problem List). Pt was able to ambulate to bathroom and back to chair with min assist and RW.  Limited by confusion and poor safety awareness.  Spoke with daughter and she states that they prefer [facility] SNF and then pt can either go to A living possibly or back to [facility].  Will follow acutely.  Pt will benefit from skilled PT to increase their independence and safety with mobility to allow discharge to the venue listed below.      Follow Up Recommendations SNF;Supervision/Assistance - 24 hour(HHPT if does have 24 care)    Equipment Recommendations  None recommended by PT    Recommendations for Other Services       Precautions / Restrictions Precautions Precautions: Fall Precaution Comments: drain L groin Restrictions Weight Bearing Restrictions: No      Mobility  Bed Mobility Overal bed mobility: Needs Assistance Bed Mobility: Supine to Sit     Supine to sit: Mod assist;HOB elevated     General bed mobility comments: multimodal cues, A for initiation, bringing legs off the bed, and trunk elevation; able to scoot EOB by herself  Transfers Overall transfer level: Needs assistance Equipment used: Rolling walker (2 wheeled) Transfers: Sit to/from Stand Sit to Stand: Min guard;Min assist;From elevated surface         General transfer comment: prefers to pull up on RW, A for  balance and boost initially from bed, improved from [facility]  Ambulation/Gait Ambulation/Gait assistance: Min guard;Min assist;+2 safety/equipment Gait Distance (Feet): 35 Feet Assistive device: Rolling walker (2 wheeled) Gait Pattern/deviations: Step-through pattern;Decreased stride length;Decreased step length - left;Decreased stance time - left;Decreased weight shift to left;Antalgic;Trunk flexed;Drifts right/left   Gait velocity interpretation: <1.31 ft/sec, indicative of household ambulator General Gait Details: Pt was able to ambulate with min assist to bathroom with cues and assist for sequencing steps and RW.  Needed assist to steer RW.  Stairs            Wheelchair Mobility    Modified Rankin (Stroke Patients Only)       Balance Overall balance assessment: Needs assistance Sitting-balance support: Single extremity supported;Feet supported Sitting balance-Leahy Scale: Fair     Standing balance support: Bilateral upper extremity supported Standing balance-Leahy Scale: Poor Standing balance comment: dependent on RW for balance in standing                             Pertinent Vitals/Pain Pain Assessment: 0-10 Pain Score: 6  Pain Location: L groin Pain Descriptors / Indicators: Discomfort;Sore;Grimacing Pain Intervention(s): Limited activity within patient's tolerance;Monitored during session;Repositioned;Premedicated before session    Home Living Family/patient expects to be discharged to:: Private residence Living Arrangements: Alone Available Help at Discharge: Personal care attendant(9-3, 3-9 caregivers from [facility] green) Type of Home: Independent living facility([facility]) Home Access: Stairs to enter   [employer] of Steps: 3 Home Layout: Multi-level;Bed/bath upstairs;Other (Comment)(split level) Home Equipment: Walker - 2 wheels;Toilet riser;Shower seat Additional Comments:  Pt unreliable historian, information gleaned from  daughter [name]    Prior Function Level of Independence: Needs assistance   Gait / Transfers Assistance Needed: uses RW for all ambulation  ADL's / Homemaking Assistance Needed: assist for bathing and dressing and for IADL from caregivers        Hand Dominance   Dominant Hand: Right    Extremity/Trunk Assessment   Upper Extremity Assessment Upper Extremity Assessment: Defer to OT evaluation    Lower Extremity Assessment Lower Extremity Assessment: LLE deficits/detail LLE Deficits / Details: pain from sx limiting ROM  LLE: Unable to fully assess due to pain    Cervical / Trunk Assessment Cervical / Trunk Assessment: Kyphotic  Communication   Communication: HOH  Cognition Arousal/Alertness: Awake/alert Behavior During Therapy: WFL for tasks assessed/performed Overall Cognitive Status: Impaired/Different from baseline(Daughter [name] says she's got baseline dementia) Area of Impairment: Attention;Memory;Following commands;Safety/judgement;Awareness;Problem solving                   Current Attention Level: Sustained Memory: Decreased short-term memory Following Commands: Follows one step commands with increased time Safety/Judgement: Decreased awareness of safety;Decreased awareness of deficits Awareness: Intellectual Problem Solving: Slow processing;Requires verbal cues General Comments: Pt unable to recall home set up information, did not remember having sx, required multiple cues for task initiation - of note: hearing aids not charged and Pt is very [place] Comments General comments (skin integrity, edema, etc.): spoke with daughter [name] on the phone; currently the patient does not have 24 hour supervision. The family is going to look into one of the children (there are 5) doing it or seeing if they have the finances available for 24 hour caregivers. If they cant' SNF recommended.  Pt's O2 levels were 92% and higher on RA throughout the session.      Exercises     Assessment/Plan    PT Assessment Patient needs continued PT services  PT Problem List Decreased strength;Decreased range of motion;Decreased activity tolerance;Decreased balance;Decreased knowledge of use of DME;Decreased safety awareness;Decreased knowledge of precautions;Pain;Decreased mobility       PT Treatment Interventions DME instruction;Gait training;Stair training;Functional mobility training;Therapeutic activities;Therapeutic exercise;Balance training;Patient/family education    PT Goals (Current goals can be found in the Care Plan section)  Acute Rehab PT Goals Patient Stated Goal: get back to walking PT Goal Formulation: With patient/family Time For Goal Achievement: 10/12/18 Potential to Achieve Goals: Good    Frequency Min 3X/week   Barriers to discharge Decreased caregiver support      Co-evaluation PT/OT/SLP Co-Evaluation/Treatment: Yes Reason for Co-Treatment: Necessary to address cognition/behavior during functional activity;For patient/therapist safety PT goals addressed during session: Mobility/safety with mobility OT goals addressed during session: ADL's and self-care;Proper use of Adaptive equipment and DME       AM-PAC PT "6 Clicks" Mobility  Outcome Measure Help needed turning from your back to your side while in a flat bed without using bedrails?: A Lot Help needed moving from lying on your back to sitting on the side of a flat bed without using bedrails?: A Lot Help needed moving to and from a bed to a chair (including a wheelchair)?: A Lot Help needed standing up from a chair using your arms (e.g., wheelchair or bedside chair)?: A Lot Help needed to walk in hospital room?: A Little Help needed climbing 3-5 steps with a railing? : A Lot 6 Click Score: 13    End of Session Equipment Utilized During Treatment: Gait belt Activity  Tolerance: Patient limited by fatigue;Patient limited by pain Patient left: in chair;with call bell/phone  within reach;with chair alarm set Nurse Communication: Mobility status PT Visit Diagnosis: Unsteadiness on feet (R26.81);Muscle weakness (generalized) (M62.81);Pain Pain - Right/Left: Left Pain - part of body: Leg    Time: 1751-0258 PT Time Calculation (min) (ACUTE ONLY): 26 min   Charges:   PT Evaluation $PT Eval Moderate Complexity: [facility] Pager:  [phone]  Office:  [place] 09/28/2018, 12:54 PM

## 2018-09-28 NOTE — Progress Notes (Addendum)
  Progress Note    09/28/2018 7:25 AM 1 Day Post-Op  Subjective:  Somnolent this morning   Vitals:   09/28/18 0400 09/28/18 0600  BP: (!) 121/49 (!) 135/46  Pulse: 77 83  Resp: 15 19  Temp: 99.6 F (37.6 C)   SpO2: 99% 99%   Physical Exam: Lungs:  Non labored Incisions:  L groin with ecchymosis but no active bleeding, soft; R groin without palpable firm hematoma Extremities:  Symmetrical DP pulses, L calf soft Neurologic: A&O  CBC    Component Value Date/Time   WBC 10.9 (H) 09/28/2018 0235   RBC 3.01 (L) 09/28/2018 0235   HGB 9.7 (L) 09/28/2018 0235   HGB 12.4 09/19/2018 1353   HCT 29.6 (L) 09/28/2018 0235   HCT 37.2 09/19/2018 1353   PLT 208 09/28/2018 0235   PLT 255 09/19/2018 1353   MCV 98.3 09/28/2018 0235   MCV 96 09/19/2018 1353   MCH 32.2 09/28/2018 0235   MCHC 32.8 09/28/2018 0235   RDW 11.7 09/28/2018 0235   RDW 11.4 (L) 09/19/2018 1353   LYMPHSABS 1.5 12/25/2017 1646   MONOABS 0.9 12/25/2017 1646   EOSABS 0.0 12/25/2017 1646   BASOSABS 0.0 12/25/2017 1646    BMET    Component Value Date/Time   NA 139 09/28/2018 0235   NA 141 09/19/2018 1344   K 4.2 09/28/2018 0235   CL 106 09/28/2018 0235   CO2 27 09/28/2018 0235   GLUCOSE 191 (H) 09/28/2018 0235   BUN 13 09/28/2018 0235   BUN 18 09/19/2018 1344   CREATININE 0.86 09/28/2018 0235   CALCIUM 8.2 (L) 09/28/2018 0235   GFRNONAA >60 09/28/2018 0235   GFRAA >60 09/28/2018 0235    INR    Component Value Date/Time   INR 1.0 07/11/2007 0820     Intake/Output Summary (Last 24 hours) at 09/28/2018 0725 Last data filed at 09/28/2018 0300 Gross per 24 hour  Intake 1629.81 ml  Output 1330 ml  Net 299.81 ml     Assessment/Plan:  85 y.o. female is s/p repair and endarterectomy of L CFA 1 Day Post-Op   Perfusing LLE well with palpable DP pulse No further bleeding L groin Continue JP drain Asa and plavix OOB today when more awake   Dagoberto Ligas, PA-C [facility]  [phone] 09/28/2018 7:25 AM  I have examined the patient, reviewed and agree with above.  Alert and oriented.  Eating breakfast.  Mild soreness.  Right and left groins soft with no evidence of hematoma.  No further bleeding from left groin.  2-3+ left dorsalis pedis pulse  JP is put out 305 cc since surgery.  Will continue until tomorrow.  Okay to be up walking today.  Anticoagulation plan per cardiology.  Does not need to be on anticoagulation from vascular surgery standpoint  Curt Jews, MD 09/28/2018 9:50 AM

## 2018-09-28 NOTE — Evaluation (Signed)
Occupational Therapy Evaluation Patient Details Name: [name] MRN: [id number] DOB: 1931/04/28 Today's Date: 09/28/2018    History of Present Illness 85 y.o.femalePMH including Arthritis, Bilateral carotid bruits (07/27/2018), CAD, Chronic back pain, Onychomycosis, Osteoarthritis, Osteopenia, PAD, PVD, Trigger finger-right, and Vertigo. Now with left heel ulceration, now s/p Left SFA stenting, followed by repair and endarterectomy of L CFA 05/12.    Clinical Impression   PTA Pt was living at [place] [facility]. But since covid has been living at home with caregiver support (but not 24 hour). At baseline she requires assist for bathing and dressing and a RW for mobility. Pt is currently min guard assist for ambulation with RW (assist to manage), min guard A for toilet transfer and peri care, set up for grooming in sitting. Max A for LB ADL. Pt will benefit from skilled OT in the acute setting as well as afterwards at the SNF level (family prefers [facility] if bed available) to maximize safety and independence in ADL and functional transfers.     Follow Up Recommendations  SNF;Supervision/Assistance - 24 hour  Pt's family prefers [facility]  Other (comment)(defer to next venue of care)    Recommendations for Other Services       Precautions / Restrictions Precautions Precautions: Fall Precaution Comments: drain L groin Restrictions Weight Bearing Restrictions: No      Mobility Bed Mobility Overal bed mobility: Needs Assistance Bed Mobility: Supine to Sit     Supine to sit: Mod assist;HOB elevated     General bed mobility comments: multimodal cues, A for initiation, bringing legs off the bed, and trunk elevation; able to scoot EOB by herself  Transfers Overall transfer level: Needs assistance Equipment used: Rolling walker (2 wheeled) Transfers: Sit to/from Stand Sit to Stand: Min guard;Min assist;From elevated surface          General transfer comment: prefers to pull up on RW, A for balance and boost initially from bed, improved from [facility]    Balance Overall balance assessment: Needs assistance Sitting-balance support: Single extremity supported;Feet supported Sitting balance-Leahy Scale: Fair     Standing balance support: Bilateral upper extremity supported Standing balance-Leahy Scale: Poor Standing balance comment: dependent on RW for balance in standing                           ADL either performed or assessed with clinical judgement   ADL Overall ADL's : Needs assistance/impaired Eating/Feeding: Set up;Sitting Eating/Feeding Details (indicate cue type and reason): required food be cut up for her Grooming: Minimal assistance;Sitting;Wash/dry hands;Wash/dry face Grooming Details (indicate cue type and reason): vc for task completion sitting in the recliner Upper Body Bathing: Moderate assistance;Sitting   Lower Body Bathing: Maximal assistance;Sit to/from stand Lower Body Bathing Details (indicate cue type and reason): due to pain in LLE Upper Body Dressing : Moderate assistance Upper Body Dressing Details (indicate cue type and reason): to don hospital gown like robe Lower Body Dressing: Maximal assistance;Sit to/from stand Lower Body Dressing Details (indicate cue type and reason): due to LLE pain Toilet Transfer: Minimal assistance;Min guard;Ambulation;RW Toilet Transfer Details (indicate cue type and reason): BSC over toilet (uses riser at home with handles) min A for assist with RW navigation Toileting- Clothing Manipulation and Hygiene: Min guard;Sit to/from stand Toileting - Clothing Manipulation Details (indicate cue type and reason): able to perform front and back peri care     Functional mobility during ADLs: Min guard;Minimal  assistance;Rolling walker;Cueing for safety(A to navigate unfamiliar walker) General ADL Comments: has caregivers that typically perform bathing/dressing  for her     Vision Baseline Vision/History: Wears glasses Patient Visual Report: No change from baseline       Perception     Praxis      Pertinent Vitals/Pain Pain Assessment: 0-10 Pain Score: 6  Pain Location: L groin Pain Descriptors / Indicators: Discomfort;Sore;Grimacing Pain Intervention(s): Limited activity within patient's tolerance;Monitored during session;Repositioned     Hand Dominance Right   Extremity/Trunk Assessment Upper Extremity Assessment Upper Extremity Assessment: Generalized weakness   Lower Extremity Assessment Lower Extremity Assessment: LLE deficits/detail;Defer to PT evaluation LLE Deficits / Details: pain from sx limiting ROM - defer to PT evaluation   Cervical / Trunk Assessment Cervical / Trunk Assessment: Kyphotic   Communication Communication Communication: HOH   Cognition Arousal/Alertness: Awake/alert Behavior During Therapy: WFL for tasks assessed/performed Overall Cognitive Status: Impaired/Different from baseline(Daughter [name] says she's got baseline dementia) Area of Impairment: Attention;Memory;Following commands;Safety/judgement;Awareness;Problem solving                   Current Attention Level: Sustained Memory: Decreased short-term memory Following Commands: Follows one step commands with increased time Safety/Judgement: Decreased awareness of safety;Decreased awareness of deficits Awareness: Intellectual Problem Solving: Slow processing;Requires verbal cues General Comments: Pt unable to recall home set up information, did not remember having sx, required multiple cues for task initiation - of note: hearing aids not charged and Pt is very [employer] Comments  spoke with daughter [name] on the phone; currently the patient does not have 24 hour supervision. The family is going to look into one of the children (there are 5) doing it or seeing if they have the finances available for 24 hour caregivers. Pt's O2 levels  were 92% and higher on RA throughout the session.     Exercises     Shoulder Instructions      Home Living Family/patient expects to be discharged to:: Private residence Living Arrangements: Alone Available Help at Discharge: Personal care attendant(9-3, 3-9 caregivers from [facility] green) Type of Home: Independent living facility([facility]) Home Access: Stairs to enter Entrance Stairs-Number of Steps: 3   Home Layout: Multi-level;Bed/bath upstairs;Other (Comment)(split level) Alternate Level Stairs-Number of Steps: 7 to bedroom, 7 down to recliner/living area   Bathroom Shower/Tub: Tub/shower unit   [employer]: Standard(uses toilet riser) Bathroom Accessibility: Yes How Accessible: Accessible via walker Home Equipment: [place] - 2 wheels;Toilet riser;Shower seat   Additional Comments: Pt unreliable historian, information gleaned from daughter [name]      Prior Functioning/Environment Level of Independence: Needs assistance  Gait / Transfers Assistance Needed: uses RW for all ambulation ADL's / Homemaking Assistance Needed: assist for bathing and dressing and for IADL from caregivers Communication / Swallowing Assistance Needed: HOH          OT Problem List: Decreased range of motion;Decreased activity tolerance;Impaired balance (sitting and/or standing);Decreased cognition;Decreased knowledge of use of DME or AE;Pain      OT Treatment/Interventions: Self-care/ADL training;DME and/or AE instruction;Therapeutic activities;Patient/family education;Balance training;Therapeutic exercise    OT Goals(Current goals can be found in the care plan section) Acute Rehab OT Goals Patient Stated Goal: get back to walking OT Goal Formulation: With patient/family Time For Goal Achievement: 10/12/18 Potential to Achieve Goals: Good ADL Goals Pt Will Perform Grooming: with supervision;standing Pt Will Perform Lower Body Bathing: with mod assist;sit to/from stand Pt Will  Perform Lower Body Dressing: with mod assist;sit to/from stand  Pt Will Transfer to Toilet: with supervision;ambulating Pt Will Perform Toileting - Clothing Manipulation and hygiene: with supervision;sit to/from stand Additional ADL Goal #1: Pt will perform bed mobility at supervision level prior to engaging in ADL  OT Frequency: Min 2X/week   Barriers to D/C:    stairs to get in the house and stairs in the home       Co-evaluation PT/OT/SLP Co-Evaluation/Treatment: Yes Reason for Co-Treatment: Necessary to address cognition/behavior during functional activity;For patient/therapist safety;To address functional/ADL transfers PT goals addressed during session: Mobility/safety with mobility;Balance;Proper use of DME OT goals addressed during session: ADL's and self-care;Proper use of Adaptive equipment and DME      AM-PAC OT "6 Clicks" Daily Activity     Outcome Measure Help from another person eating meals?: A Little Help from another person taking care of personal grooming?: A Little Help from another person toileting, which includes using toliet, bedpan, or urinal?: A Little Help from another person bathing (including washing, rinsing, drying)?: A Lot Help from another person to put on and taking off regular upper body clothing?: A Lot Help from another person to put on and taking off regular lower body clothing?: A Lot 6 Click Score: 15   End of Session Equipment Utilized During Treatment: Gait belt;Rolling walker Nurse Communication: Mobility status;Other (comment)(Pt talking to daughter [name] on  the phone)  Activity Tolerance: Patient tolerated treatment well Patient left: in chair;with call bell/phone within reach;with chair alarm set  OT Visit Diagnosis: Unsteadiness on feet (R26.81);Other abnormalities of gait and mobility (R26.89);Muscle weakness (generalized) (M62.81);Other symptoms and signs involving cognitive function;Pain Pain - Right/Left: Left Pain - part of body:  Leg                Time: 6283-1517 OT Time Calculation (min): 45 min Charges:  OT General Charges $OT Visit: 1 Visit OT Evaluation $OT Eval Moderate Complexity: 1 Mod OT Treatments $Self Care/Home Management : 8-22 mins Hulda Humphrey OTR/L Acute Rehabilitation Services Pager: [phone] Office: [place] 09/28/2018, 11:17 AM

## 2018-09-28 NOTE — Progress Notes (Signed)
Sand bag removed from left groin site. No s/s of bleeding. JP drain draning small amount of bright red drainage. Will continue to monitor.

## 2018-09-28 NOTE — Progress Notes (Addendum)
Subjective:  S/p Lt SFA stenting, followed by repair and endarterectomy of L CFA  05/12 No leg pain. Slept well. Drain in place Brief episodes of SVT on telemetry  Objective:  Vital Signs in the last 24 hours: Temp:  [97.3 F (36.3 C)-99.6 F (37.6 C)] 98.3 F (36.8 C) (05/13 0803) Pulse Rate:  [64-93] 83 (05/13 0803) Resp:  [9-28] 13 (05/13 0803) BP: (63-202)/(35-98) 137/53 (05/13 0803) SpO2:  [92 %-100 %] 100 % (05/13 0803)  Intake/Output from previous day: 05/12 0701 - 05/13 0700 In: 1629.8 [P.O.:290; I.V.:1089.8; IV Piggyback:250] Out: 1330 [Urine:950; Drains:305; Blood:75]  Physical Exam  Constitutional: She is oriented to person, place, and time. She appears well-developed and well-nourished. No distress.  HENT:  Head: Normocephalic and atraumatic.  Eyes: Pupils are equal, round, and reactive to light. Conjunctivae are normal.  Neck: No JVD present.  Cardiovascular: Normal rate, regular rhythm and intact distal pulses.  Murmur heard.  Harsh midsystolic murmur is present with a grade of 2/6 at the upper right sternal border radiating to the neck. Bounding pulses Lt foot Drain in place Lt thigh Rt groin with no hematoma Lt groin with mild ecchymosis. Dressing in place.   Pulmonary/Chest: Effort normal and breath sounds normal. She has no wheezes. She has no rales.  Abdominal: Soft. Bowel sounds are normal. There is no rebound.  Musculoskeletal:        General: No edema.  Lymphadenopathy:    She has no cervical adenopathy.  Neurological: She is alert and oriented to person, place, and time. No cranial nerve deficit.  Skin: Skin is warm and dry.  Psychiatric: She has a normal mood and affect.  Nursing note and vitals reviewed.    Lab Results: BMP Recent Labs    12/25/17 1646 09/19/18 1344 09/28/18 0235  NA 124* 141 139  K 3.7 3.9 4.2  CL 87* 103 106  CO2 26 25 27   GLUCOSE 162* 96 191*  BUN 17 18 13   CREATININE 1.21* 1.04* 0.86  CALCIUM 8.2* 9.0 8.2*   GFRNONAA 40* 49* >60  GFRAA 46* 57* >60    CBC Recent Labs  Lab 09/28/18 0235  WBC 10.9*  RBC 3.01*  HGB 9.7*  HCT 29.6*  PLT 208  MCV 98.3  MCH 32.2  MCHC 32.8  RDW 11.7    HEMOGLOBIN A1C No results found for: HGBA1C, MPG  Cardiac Panel (last 3 results) Recent Labs    12/25/17 1646  TROPONINI <0.03   Hepatic Function Panel Recent Labs    12/25/17 1646  PROT 6.4*  ALBUMIN 3.6  AST 23  ALT 13  ALKPHOS 67  BILITOT 0.5   Peripheral angiography and intervention 09/27/2018: Moderate calcification and mild diffuse disease in abdominal aorta and bilateral iliac arteries.  Two distinct areas of CTO ion Left SFA, followed by severe diffuse disease. Successful PTCA and stenting Lt SFA with 3 overlapping stents 5.0 X 150 mm, 5.0 X 120 mm, 5.0 X 60 mm Innova self exapanding stents from distal to ostial Lt SFA.   Area of dissection Lt CFA required repair and endarterectomy by Dr. Scot Dock 09/28/2018  Assessment & Recommendations:  85 y.o. Caucasian female  with hypertension, hyperlipidemia, now with left heel ulceration, now s/p S/p Lt SFA stenting, followed by repair and endarterectomy of L CFA  05/12  PAD s/p intervention: Continue Aspirin, plavix, statin. Drain in place. Out of bed and ambulate today.  SVT: Brief episode on telemetry up to 10 beats. Started metoprolol 2 mg  bid. She may not need other antihypertensives  Possible discharge 05/14   Nigel Mormon, M.D. 09/28/2018, 8:29 AM [facility] Pager: [phone] Office: [phone] If no answer: [phone]

## 2018-09-28 NOTE — Progress Notes (Signed)
While giving medication, noticed pt had bright red blood in her gown right above left groin incision site. Left groin was oozing scant amount of bright red blood. Pressure applied and 4x4 gauge and tape applied. Left thigh JP drain was full, 100 cc removed and JP drain charged. I was in the room, JP drain started filling back up. It was  constantly dripping bright red blood. Emptied 50 cc red bright blood again from JP drain.VItals stable, Breathing even and unlabored in 2l 02 via [place]. Charge nurse made aware, charge Nurse in the room with me.  2340: DR. Scot Dock paged. 2345: DR. Scot Dock called back, Notified of pt's current situation. MD stated to turn heparin drip off, and he will come down and look at the patient.  2346: Heparin drip stopped, Pharmacy notified.  2350: DR. Scot Dock by bedside. MD started applying pressure on left groin site.Another 20 cc emptied from JP drain. MD wanted sand bag to hold pressure. Pressure held per MD till 0015.  0015: 5 lb sand bag applied to left groin site per MD, stated leave sand bag in place for an hour. Pt resting in bed. Vitals stable. Bilateral pedal pulses +3. Drained another 15 cc bright red blood from JP drain. Will continue to monitor.

## 2018-09-28 NOTE — Anesthesia Postprocedure Evaluation (Signed)
Anesthesia Post Note  Patient: [name]  Procedure(s) Performed: left FEMORAL THROMBECTOMY (Left Leg Upper) Patch Angioplasty of left femoral artery using xenosure bovine pericardium patch (Left Groin) left Endarterectomy Femoral (Left Groin)     Patient location during evaluation: Other Anesthesia Type: General Level of consciousness: awake and alert Pain management: pain level controlled Vital Signs Assessment: post-procedure vital signs reviewed and stable Respiratory status: spontaneous breathing, nonlabored ventilation and respiratory function stable Cardiovascular status: blood pressure returned to baseline and stable Postop Assessment: no apparent nausea or vomiting Anesthetic complications: no    Last Vitals:  Vitals:   09/28/18 0831 09/28/18 1534  BP:  138/63  Pulse: 83 62  Resp:  15  Temp:  36.9 C  SpO2:  98%    Last Pain:  Vitals:   09/28/18 1534  TempSrc: Oral  PainSc:                  Caniyah Murley,W. EDMOND

## 2018-09-29 ENCOUNTER — Inpatient Hospital Stay (HOSPITAL_COMMUNITY): Payer: Medicare Other

## 2018-09-29 ENCOUNTER — Telehealth: Payer: Self-pay | Admitting: Vascular Surgery

## 2018-09-29 DIAGNOSIS — I10 Essential (primary) hypertension: Secondary | ICD-10-CM | POA: Diagnosis not present

## 2018-09-29 DIAGNOSIS — R531 Weakness: Secondary | ICD-10-CM | POA: Diagnosis not present

## 2018-09-29 DIAGNOSIS — L97929 Non-pressure chronic ulcer of unspecified part of left lower leg with unspecified severity: Secondary | ICD-10-CM | POA: Diagnosis not present

## 2018-09-29 DIAGNOSIS — I739 Peripheral vascular disease, unspecified: Secondary | ICD-10-CM

## 2018-09-29 DIAGNOSIS — I998 Other disorder of circulatory system: Secondary | ICD-10-CM

## 2018-09-29 DIAGNOSIS — Z9181 History of falling: Secondary | ICD-10-CM

## 2018-09-29 DIAGNOSIS — E785 Hyperlipidemia, unspecified: Secondary | ICD-10-CM | POA: Diagnosis not present

## 2018-09-29 DIAGNOSIS — R41841 Cognitive communication deficit: Secondary | ICD-10-CM | POA: Diagnosis not present

## 2018-09-29 DIAGNOSIS — M6281 Muscle weakness (generalized): Secondary | ICD-10-CM | POA: Diagnosis not present

## 2018-09-29 DIAGNOSIS — R2681 Unsteadiness on feet: Secondary | ICD-10-CM | POA: Diagnosis not present

## 2018-09-29 DIAGNOSIS — N183 Chronic kidney disease, stage 3 (moderate): Secondary | ICD-10-CM | POA: Diagnosis not present

## 2018-09-29 DIAGNOSIS — Z9889 Other specified postprocedural states: Secondary | ICD-10-CM | POA: Diagnosis not present

## 2018-09-29 DIAGNOSIS — I129 Hypertensive chronic kidney disease with stage 1 through stage 4 chronic kidney disease, or unspecified chronic kidney disease: Secondary | ICD-10-CM | POA: Diagnosis not present

## 2018-09-29 DIAGNOSIS — R2689 Other abnormalities of gait and mobility: Secondary | ICD-10-CM | POA: Diagnosis not present

## 2018-09-29 DIAGNOSIS — F0390 Unspecified dementia without behavioral disturbance: Secondary | ICD-10-CM | POA: Diagnosis not present

## 2018-09-29 DIAGNOSIS — L97429 Non-pressure chronic ulcer of left heel and midfoot with unspecified severity: Secondary | ICD-10-CM | POA: Diagnosis not present

## 2018-09-29 DIAGNOSIS — M1991 Primary osteoarthritis, unspecified site: Secondary | ICD-10-CM | POA: Diagnosis not present

## 2018-09-29 NOTE — Progress Notes (Signed)
Physical Therapy Treatment Patient Details Name: [name] MRN: [id number] DOB: 06/28/1930 Today's Date: 09/29/2018    History of Present Illness 85 y.o.femalePMH including Arthritis, Bilateral carotid bruits (07/27/2018), CAD, Chronic back pain, Onychomycosis, Osteoarthritis, Osteopenia, PAD, PVD, Trigger finger-right, and Vertigo. Now with left heel ulceration, now s/p Left SFA stenting, followed by repair and endarterectomy of L CFA 05/12.     PT Comments    Pt admitted with above diagnosis. Pt currently with functional limitations due to balance and endurance deficits. Pt was able to ambulate with RW with min guard assist but was incontinent and assisted to bathroom.  Cleaned pt and then she walked back to chair.  More stable with RW today. Pt still confused and was not aware of where she was.    Progressing.   Pt will benefit from skilled PT to increase their independence and safety with mobility to allow discharge to the venue listed below.     Follow Up Recommendations  SNF;Supervision/Assistance - 24 hour(HHPT if does have 24 care)     Equipment Recommendations  None recommended by PT    Recommendations for Other Services       Precautions / Restrictions Precautions Precautions: Fall Precaution Comments: drain L groin Restrictions Weight Bearing Restrictions: No    Mobility  Bed Mobility Overal bed mobility: Needs Assistance Bed Mobility: Supine to Sit     Supine to sit: HOB elevated;Min assist     General bed mobility comments: multimodal cues, A for initiation, bringing legs off the bed, and trunk elevation; able to scoot EOB by herself  Transfers Overall transfer level: Needs assistance Equipment used: Rolling walker (2 wheeled) Transfers: Sit to/from Stand Sit to Stand: Min guard         General transfer comment: prefers to pull up on RW, A for balance and boost initially from bed  Ambulation/Gait Ambulation/Gait assistance: Min guard Gait  Distance (Feet): 40 Feet Assistive device: Rolling walker (2 wheeled) Gait Pattern/deviations: Step-through pattern;Decreased stride length;Decreased step length - left;Decreased stance time - left;Decreased weight shift to left;Antalgic;Trunk flexed;Drifts right/left   Gait velocity interpretation: <1.31 ft/sec, indicative of household ambulator General Gait Details: Pt was able to ambulate with min guard  assist to bathroom with cues.  Needed less assist today however pt was urinating all the way to the bathroom.  Had to clean pt and change gown.  Then pt walked back to chair to eat breakfast.  Steadier on feet today but still needed cues for prozimity to RW>    Stairs             Wheelchair Mobility    Modified Rankin (Stroke Patients Only)       Balance Overall balance assessment: Needs assistance Sitting-balance support: Feet supported;No upper extremity supported Sitting balance-Leahy Scale: Fair     Standing balance support: Bilateral upper extremity supported Standing balance-Leahy Scale: Poor Standing balance comment: dependent on RW for balance in standing                            Cognition Arousal/Alertness: Awake/alert Behavior During Therapy: WFL for tasks assessed/performed Overall Cognitive Status: Impaired/Different from baseline(Daughter [name] says she's got baseline dementia) Area of Impairment: Attention;Memory;Following commands;Safety/judgement;Awareness;Problem solving                   Current Attention Level: Sustained Memory: Decreased short-term memory Following Commands: Follows one step commands with increased time Safety/Judgement: Decreased awareness of safety;Decreased  awareness of deficits Awareness: Intellectual Problem Solving: Slow processing;Requires verbal cues        Exercises General Exercises - Lower Extremity Ankle Circles/Pumps: AROM;Both;15 reps;Seated Long Arc Quad: AROM;Both;10 reps;Seated     General Comments        Pertinent Vitals/Pain Pain Assessment: Faces Faces Pain Scale: Hurts little more Pain Location: L groin Pain Descriptors / Indicators: Discomfort;Sore;Grimacing Pain Intervention(s): Limited activity within patient's tolerance;Monitored during session;Repositioned    Home Living                      Prior Function            PT Goals (current goals can now be found in the care plan section) Acute Rehab PT Goals Patient Stated Goal: get back to walking Progress towards PT goals: Progressing toward goals    Frequency    Min 3X/week      PT Plan Current plan remains appropriate    Co-evaluation              AM-PAC PT "6 Clicks" Mobility   Outcome Measure  Help needed turning from your back to your side while in a flat bed without using bedrails?: A Lot Help needed moving from lying on your back to sitting on the side of a flat bed without using bedrails?: A Lot Help needed moving to and from a bed to a chair (including a wheelchair)?: A Lot Help needed standing up from a chair using your arms (e.g., wheelchair or bedside chair)?: A Little Help needed to walk in hospital room?: A Little Help needed climbing 3-5 steps with a railing? : A Lot 6 Click Score: 14    End of Session Equipment Utilized During Treatment: Gait belt Activity Tolerance: Patient limited by fatigue;Patient limited by pain Patient left: in chair;with call bell/phone within reach;with chair alarm set Nurse Communication: Mobility status PT Visit Diagnosis: Unsteadiness on feet (R26.81);Muscle weakness (generalized) (M62.81);Pain Pain - Right/Left: Left Pain - part of body: Leg     Time: 9798-9211 PT Time Calculation (min) (ACUTE ONLY): 30 min  Charges:  $Gait Training: 8-22 mins $Therapeutic Exercise: 8-22 mins                     [place] Pager:  [phone]  Office:  [place] 09/29/2018, 10:51 AM

## 2018-09-29 NOTE — Discharge Summary (Signed)
Physician Discharge Summary  Patient ID: [name] MRN: [id number] DOB/AGE: 83-May-1932 83 y.o.  Admit date: 09/27/2018 Discharge date: 09/29/2018  Primary Discharge Diagnosis PAD with non healing left foot disorder  Secondary Discharge Diagnosis Hypertension Hyperlipidemia Stage III chronic kidney disease  Significant Diagnostic Studies:  Peripheral arteriogram and angioplasty to CTO left SFA 09/27/2018: 1. Ultrasound guided right common femoral artery and left anterior tibial artery access 2. Abdominal aortogram 3. Crossover technique into left common iliac artery 4. Nonselective bilateral lower extremity distal runoff 5. Selective left lower extremity runoff 6. Successful PTCA and stenting Lt SFA with 3 overlapping stents     5.0 X 150 mm, 5.0 X 120 mm, 5.0 X 60 mm Innova self exapanding stents from distal to ostial Lt SFA.  7. Hemostasis 6/7 Fr Mynx closure right common femoral artery 8. Conscious sedation monitoring 228 min  09/27/2018:   Left femoral thrombectomy. Left femoral endarterectomy with bovine pericardial patch angioplasty  Lower extremity arterial duplex 09/29/2018: Right: Resting right ankle-brachial index is within normal range. No evidence of significant right lower extremity arterial disease.   Left: Resting left ankle-brachial index is within normal range. No evidence of significant left lower extremity arterial disease.  Hospital Course: Caucasian female  with hypertension, hyperlipidemia, now with left heel ulceration and abnormal LE arterial duplex suggesting Lt prox SFA stenosis.  She was semi-electively admitted to the hospital in view of nonhealing foot ulceration and abnormal Dopplers suggestive of left SFA disease.  She underwent successful but complex angioplasty to occluded left SFA both with antegrade and retrograde axis using the right femoral artery and also left anterior tibial artery.  However at the end of the procedure it was recognized  that the left common femoral artery had what appeared to be a dissection versus a thrombus.  However just prior to right groin sheath being pulled out of the left common iliac artery, angiography revealed widely patent left femoral artery.  But with the suspicion that there could be significant plaque burden or dissection although there was three-vessel runoff noted in the angiogram post and pasty, passed a surgical consultation was obtained.  Patient was evaluated by Dr. Shanon Rosser who felt that she had no popliteal pulse and with the suspicion that this tends may have occluded, he took her urgently to the operating room and found no evidence of thrombus but had a severely calcified and heavy plaque burden left common femoral artery which had dissection which she did patch angioplasty with excellent results.  Recommendations on discharge: patient will be discharged to [facility] rehabilitation center as patient has multiple medical comorbidity including mild dementia, and is now postop and specifically will need assistance and 24-hour care as recommended by PT and OT for at least a short time.  She has a very supportive family members and are been in constant discussion with them throughout the procedure.  She'll be followed up in the outpatient basis next week by me.  Discharge Exam: Blood pressure (!) 129/55, pulse 63, temperature 98.1 F (36.7 C), temperature source Oral, resp. rate 18, height 5\' 4"  (1.626 m), weight 62.1 kg, SpO2 97 %.  General appearance: alert, cooperative, appears stated age and no distress Resp: clear to auscultation bilaterally Cardio: S1 and S2 is normal there is a 2/6 crescendo systolic ejection murmur in the right sternal border. Extremities: Left groin surgical site appears to be healing well.  Bilateral femoral pulses are bounding.  She has palpable pedal pulses bilaterally.  Capillary refill is  normal. Labs:   Lab Results  Component Value Date   WBC 10.9 (H)  09/28/2018   HGB 9.7 (L) 09/28/2018   HCT 29.6 (L) 09/28/2018   MCV 98.3 09/28/2018   PLT 208 09/28/2018    Recent Labs  Lab 09/28/18 0235  NA 139  K 4.2  CL 106  CO2 27  BUN 13  CREATININE 0.86  CALCIUM 8.2*  GLUCOSE 191*    Lipid Panel  No results found for: CHOL, TRIG, HDL, CHOLHDL, VLDL, LDLCALC  BNP (last 3 results) No results for input(s): BNP in the last 8760 hours.  HEMOGLOBIN A1C No results found for: HGBA1C, MPG  Cardiac Panel (last 3 results) Recent Labs    12/25/17 1646  TROPONINI <0.03    Lab Results  Component Value Date   TROPONINI <0.03 12/25/2017     TSH No results for input(s): TSH in the last 8760 hours.  Radiology: Vas [place] Abi With/wo Tbi  Result Date: 09/29/2018 LOWER EXTREMITY DOPPLER STUDY Indications: Ischemia.  Vascular Interventions: 09/27/2018 - L Femoral thrombectomy. Performing Technologist: Carlos Levering Rvt  Examination Guidelines: A complete evaluation includes at minimum, Doppler waveform signals and systolic blood pressure reading at the level of bilateral brachial, anterior tibial, and posterior tibial arteries, when vessel segments are accessible. Bilateral testing is considered an integral part of a complete examination. Photoelectric Plethysmograph (PPG) waveforms and toe systolic pressure readings are included as required and additional duplex testing as needed. Limited examinations for reoccurring indications may be performed as noted.  ABI Findings: +--------+------------------+-----+---------+--------+ Right   Rt Pressure (mmHg)IndexWaveform Comment  +--------+------------------+-----+---------+--------+ [id number]                    triphasic         +--------+------------------+-----+---------+--------+ PTA     169               1.01 triphasic         +--------+------------------+-----+---------+--------+ DP      180               1.08 triphasic          +--------+------------------+-----+---------+--------+ +--------+------------------+-----+---------+-------+ Left    Lt Pressure (mmHg)IndexWaveform Comment +--------+------------------+-----+---------+-------+ [id number]                    triphasic        +--------+------------------+-----+---------+-------+ PTA     159               0.95 triphasic        +--------+------------------+-----+---------+-------+ DP      171               1.02 triphasic        +--------+------------------+-----+---------+-------+ +-------+-----------+-----------+------------+------------+ ABI/TBIToday's ABIToday's TBIPrevious ABIPrevious TBI +-------+-----------+-----------+------------+------------+ Right  1.08                                           +-------+-----------+-----------+------------+------------+ Left   1.02                                           +-------+-----------+-----------+------------+------------+  Summary: Right: Resting right ankle-brachial index is within normal range. No evidence of significant right lower extremity arterial disease. Left: Resting left ankle-brachial index is within normal range. No evidence of  significant left lower extremity arterial disease.  *See table(s) above for measurements and observations.    Preliminary    Pcv Lower Arterial (bilateral)  Result Date: 09/11/2018 Lower Extremity Arterial Duplex 09/09/2018 No hemodynamically significant stenoses are identified in the right lower extremity arterial system. There is diffuse heterogeneous plaque in the right lower extremity. Left mid SFA shows monophasic waveform with suggestion of high grade stenosis in the proximal SFA. This exam reveals normal perfusion of the right lower extremity (ABI 1.09) and normal perfusion of the left lower extremity (ABI 1.02).  Modetately abnormal waveform right ankle and severely abnormal waveform left at the ankle. ABI can be falsely elevated in diabetic  patients with arteriosclerosis. Clinical correlation recommended.     FOLLOW UP PLANS AND APPOINTMENTS  Allergies as of 09/29/2018      Reactions   Hydrochlorothiazide Other (See Comments)   Dropped sodium level   Penicillins    Unknown  Did it involve swelling of the face/tongue/throat, SOB, or low BP? Unknown Did it involve sudden or severe rash/hives, skin peeling, or any reaction on the inside of your mouth or nose? Unknown Did you need to seek medical attention at a hospital or doctor's office? Unknown When did it last happen?unknown If all above answers are "NO", may proceed with cephalosporin use.   Sulfa Antibiotics    unknown      Medication List    TAKE these medications   acetaminophen 650 MG CR tablet Commonly known as:  TYLENOL Take 650 mg by mouth 3 (three) times daily.   amLODipine 5 MG tablet Commonly known as:  NORVASC Take 5 mg by mouth daily.   aspirin 81 MG tablet Take 81 mg by mouth daily.   cetirizine 10 MG tablet Commonly known as:  ZYRTEC Take 10 mg by mouth daily.   cholecalciferol 1000 units tablet Commonly known as:  VITAMIN D Take 1,000 Units by mouth daily.   clindamycin 150 MG capsule Commonly known as:  CLEOCIN Take 600 mg by mouth See admin instructions. Take 600 mg 1 hour prior to dental work   clopidogrel 75 MG tablet Commonly known as:  PLAVIX Take 1 tablet (75 mg total) by mouth daily. What changed:  when to take this   diclofenac sodium 1 % Gel Commonly known as:  VOLTAREN Apply 1 application topically 4 (four) times daily as needed (arthritis pain).   famotidine 40 MG tablet Commonly known as:  PEPCID Take 40 mg by mouth every evening.   hydrALAZINE 25 MG tablet Commonly known as:  APRESOLINE Take 25 mg by mouth daily as needed (systolic bp goes over 481).   Icy Hot 5 % Ptch Generic drug:  Menthol Apply 1 patch topically daily as needed (pain).   Livalo 2 MG Tabs Generic drug:  Pitavastatin Calcium Take  1 mg by mouth every evening.   losartan 100 MG tablet Commonly known as:  COZAAR Take 100 mg by mouth daily.   meclizine 12.5 MG tablet Commonly known as:  ANTIVERT Take 12.5 mg by mouth 3 (three) times daily as needed for dizziness.   nystatin powder Commonly known as:  MYCOSTATIN/NYSTOP Apply 15 g topically daily as needed (rash).   PRESCRIPTION MEDICATION Apply 1 application topically 3 (three) times daily as needed (back pain). Baclofen 5%, Diclofenac 3%, Gabapentin 6% mix 1g with 2g of lidocaine   sertraline 25 MG tablet Commonly known as:  ZOLOFT Take 25 mg by mouth daily.   THERATEARS OP Place 1 drop  into the right eye daily as needed (dry eyes).   traMADol 50 MG tablet Commonly known as:  ULTRAM Take 25 mg by mouth 3 (three) times daily.      Follow-up Information    Angelia Mould, MD In 3 weeks.   Specialties:  Vascular Surgery, Cardiology Why:  Office will call you to arrange your appt (sent) Contact information: [place] [address] [phone]        Adrian Prows, MD Follow up on 10/05/2018.   Specialty:  Cardiology Why:  2 pm. Contact information: [place] [address] [phone]          Adrian Prows, MD 09/29/2018, 2:57 PM  Pager: [phone] Office: [phone] If no answer: [phone]

## 2018-09-29 NOTE — NC FL2 (Signed)
[place] LEVEL OF CARE SCREENING TOOL     IDENTIFICATION  Patient Name: [name] Birthdate: 11/12/1930 Sex: female Admission Date (Current Location): 09/27/2018  [facility] and [place] Number:  Herbalist and Address:  [facility] [place], [place] [address], [place], [place] [address]      Provider Number: [id number]  Attending Physician Name and Address:  Nigel Mormon, MD  Relative Name and Phone Number:  Maren Beach (daughter)- [phone]    Current Level of Care: Hospital Recommended Level of Care: [place] Prior Approval Number:    Date Approved/Denied:   PASRR Number: [id number]  Discharge Plan: SNF    Current Diagnoses: Patient Active Problem List   Diagnosis Date Noted  . PAD (peripheral artery disease) ([place]) 09/27/2018  . Bilateral carotid bruits 07/27/2018  . Pain in limb-Left Knee 04/04/2013  . Peripheral vascular disease, unspecified ([place]) 04/04/2013  . Choledocholithiasis with obstruction 10/30/2011  . PVD (peripheral vascular disease) ([place]) 10/29/2011  . Pancreatitis 10/28/2011  . Cholelithiasis 10/28/2011  . UTI (urinary tract infection) 10/28/2011  . Hypertension 10/28/2011  . Hypercholesterolemia 10/28/2011    Orientation RESPIRATION BLADDER Height & Weight     Self, Time, Situation, Place  Normal Continent Weight: 62.1 kg Height:  5\' 4"  (162.6 cm)  BEHAVIORAL SYMPTOMS/MOOD NEUROLOGICAL BOWEL NUTRITION STATUS      Continent Diet(Heart healthy)  AMBULATORY STATUS COMMUNICATION OF NEEDS Skin   Limited Assist Verbally Surgical wounds(s/p Left femoral endarterectomy with bovine pericardial patch angioplasty)                       Personal Care Assistance Level of Assistance  Bathing, Dressing Bathing Assistance: Limited assistance   Dressing Assistance: Limited assistance     Functional Limitations Info             SPECIAL CARE FACTORS FREQUENCY  PT (By licensed  PT), OT (By licensed OT)     PT Frequency: 5x week OT Frequency: 5x week            Contractures Contractures Info: Not present    Additional Factors Info                  Current Medications (09/29/2018):  This is the current hospital active medication list Current Facility-Administered Medications  Medication Dose Route Frequency Provider Last Rate Last Dose  . 0.9 %  sodium chloride infusion  250 mL Intravenous PRN Rhyne, Samantha J, PA-C      . 0.9 %  sodium chloride infusion  250 mL Intravenous PRN Rhyne, Samantha J, PA-C      . 0.9 %  sodium chloride infusion  500 mL Intravenous Once PRN Rhyne, Samantha J, PA-C      . acetaminophen (TYLENOL) tablet 500 mg  500 mg Oral TID Rhyne, Samantha J, PA-C   500 mg at 09/29/18 0918  . acetaminophen (TYLENOL) tablet 650 mg  650 mg Oral Q4H PRN Rhyne, Samantha J, PA-C      . aspirin EC tablet 81 mg  81 mg Oral Daily Gabriel Earing, PA-C   81 mg at 09/29/18 0918  . bisacodyl (DULCOLAX) suppository 10 mg  10 mg Rectal Daily PRN Rhyne, Samantha J, PA-C      . clopidogrel (PLAVIX) tablet 75 mg  75 mg Oral Q breakfast Rhyne, Samantha J, PA-C   75 mg at 09/29/18 0918  . docusate sodium (COLACE) capsule 100 mg  100 mg  Oral Daily Gabriel Earing, PA-C   100 mg at 09/29/18 2778  . famotidine (PEPCID) tablet 40 mg  40 mg Oral QPM Rhyne, Samantha J, PA-C   40 mg at 09/28/18 1729  . guaiFENesin-dextromethorphan (ROBITUSSIN DM) 100-10 MG/5ML syrup 15 mL  15 mL Oral Q4H PRN Rhyne, Samantha J, PA-C      . hydrALAZINE (APRESOLINE) injection 5 mg  5 mg Intravenous Q20 Min PRN Rhyne, Samantha J, PA-C      . hydrALAZINE (APRESOLINE) tablet 25 mg  25 mg Oral Daily PRN Rhyne, Samantha J, PA-C      . labetalol (NORMODYNE) injection 10 mg  10 mg Intravenous Q10 min PRN Rhyne, Samantha J, PA-C      . magnesium sulfate IVPB 2 g 50 mL  2 g Intravenous Daily PRN Rhyne, Samantha J, PA-C      . meclizine (ANTIVERT) tablet 12.5 mg  12.5 mg Oral TID PRN Rhyne,  Samantha J, PA-C      . metoprolol tartrate (LOPRESSOR) injection 2-5 mg  2-5 mg Intravenous Q2H PRN Rhyne, Samantha J, PA-C      . metoprolol tartrate (LOPRESSOR) tablet 25 mg  25 mg Oral BID Patwardhan, Manish J, MD   25 mg at 09/29/18 0918  . morphine 2 MG/ML injection 1 mg  1 mg Intravenous Q3H PRN Rhyne, Samantha J, PA-C      . ondansetron (ZOFRAN) injection 4 mg  4 mg Intravenous Q6H PRN Rhyne, Samantha J, PA-C      . pantoprazole (PROTONIX) EC tablet 40 mg  40 mg Oral Daily Rhyne, Samantha J, PA-C   40 mg at 09/29/18 0918  . phenol (CHLORASEPTIC) mouth spray 1 spray  1 spray Mouth/Throat PRN Rhyne, Samantha J, PA-C      . polyethylene glycol (MIRALAX / GLYCOLAX) packet 17 g  17 g Oral Daily PRN Rhyne, Samantha J, PA-C      . polyvinyl alcohol (LIQUIFILM TEARS) 1.4 % ophthalmic solution 1 drop  1 drop Right Eye PRN Rhyne, Samantha J, PA-C      . potassium chloride SA (K-DUR) CR tablet 20-40 mEq  20-40 mEq Oral Daily PRN Rhyne, Samantha J, PA-C      . pravastatin (PRAVACHOL) tablet 40 mg  40 mg Oral q1800 Rhyne, Samantha J, PA-C   40 mg at 09/28/18 1728  . sertraline (ZOLOFT) tablet 25 mg  25 mg Oral Daily Rhyne, Hulen Shouts, PA-C   25 mg at 09/29/18 2423  . sodium chloride flush (NS) 0.9 % injection 3 mL  3 mL Intravenous Q12H Rhyne, Samantha J, PA-C   3 mL at 09/29/18 0932  . sodium chloride flush (NS) 0.9 % injection 3 mL  3 mL Intravenous PRN Rhyne, Samantha J, PA-C      . sodium chloride flush (NS) 0.9 % injection 3 mL  3 mL Intravenous Q12H Rhyne, Samantha J, PA-C   3 mL at 09/29/18 0931  . sodium chloride flush (NS) 0.9 % injection 3 mL  3 mL Intravenous PRN Rhyne, Samantha J, PA-C      . traMADol (ULTRAM) tablet 25 mg  25 mg Oral Q6H PRN Rhyne, Hulen Shouts, PA-C   25 mg at 09/29/18 0919     Discharge Medications: Please see discharge summary for a list of discharge medications.  Relevant Imaging Results:  Relevant Lab Results:   Additional Information SS # [id number]  , Covid  test 5/8- negative  Azrielle Springsteen, Romeo Rabon, RN

## 2018-09-29 NOTE — Progress Notes (Signed)
JP Drain removed w/o difficulty.  Tube intact.  Site clean & dry.  5 cc in drain.

## 2018-09-29 NOTE — Progress Notes (Signed)
Daugter Vickie notified of discharge and the need for her to have pt at [facility] by 430.

## 2018-09-29 NOTE — TOC Transition Note (Signed)
Transition of Care [facility]) - CM/SW Discharge Note   Patient Details  Name: LEONIA HEATHERLY MRN: [id number] Date of Birth: 05-25-1930  Transition of Care [facility]) CM/SW Contact:  Vinie Sill, [place] Phone Number: 09/29/2018, 3:28 PM   Clinical Narrative:    Patient will DC to: [facility] Date:09/29/2018 Family Notified: Jocelyn Lamer, daughter Transport By: Ernst Spell- daughter Allegra Lai, patient, and facility notified of DC. Discharge Summary sent to facility. RN given number for report [phone], Room 7015.  Clinical Social Worker signing off. Thurmond Butts, MSW, [facility] Clinical Social Worker [phone]     Final next level of care: Skilled Nursing Facility Barriers to Discharge: No Barriers Identified   Patient Goals and CMS Choice Patient states their goals for this hospitalization and ongoing recovery are:: family wants patient to go to SNF CMS Medicare.gov Compare Post Acute Care list provided to:: Other (Comment Required)(family choose facility) Choice offered to / list presented to : NA  Discharge Placement   Existing PASRR number confirmed : 09/29/18          Patient chooses bed at: [facility] Patient to be transferred to facility by: Daughter, Jocelyn Lamer Name of family member notified: Jocelyn Lamer, daughter Patient and family notified of of transfer: 09/29/18  Discharge Plan and Services                                     Social Determinants of Health (SDOH) Interventions     Readmission Risk Interventions No flowsheet data found.

## 2018-09-29 NOTE — Telephone Encounter (Signed)
sch appt spk to pt daughter 10/12/2018 10am p/o MD

## 2018-09-29 NOTE — Progress Notes (Signed)
ABI's have been completed. Preliminary results can be found in CV Proc through chart review.   09/29/18 12:18 PM [name] RVT

## 2018-09-29 NOTE — Telephone Encounter (Signed)
-----   Message from Gabriel Earing, [place] sent at 09/27/2018  6:36 PM EDT ----- S/p left fem endarterectomy with patch angioplasty 09/27/2018.  F/u with Dr.  Scot Dock in 2-3 weeks.  Thanks

## 2018-09-29 NOTE — Progress Notes (Signed)
   VASCULAR SURGERY ASSESSMENT & PLAN:   POD 2 : Left femoral endarterectomy with bovine pericardial patch angioplasty.  The patient is doing well with brisk Doppler signals in the left foot.  The groin incision looks fine.  Okay to mobilize from my standpoint.  JP put out only 10 cc last shift.  I have written an order to discontinue this.  Okay to discharge from my standpoint.  SUBJECTIVE:   No specific complaints.  PHYSICAL EXAM:   Vitals:   09/28/18 1534 09/28/18 1946 09/28/18 2152 09/29/18 0400  BP: 138/63 (!) 165/46 132/61 (!) 150/71  Pulse: 62 81 82 70  Resp: 15 15  15   Temp: 98.4 F (36.9 C) 98.8 F (37.1 C)    TempSrc: Oral Oral    SpO2: 98% 100%  94%  Weight:      Height:       Brisk Doppler signals in the anterior tibial and posterior tibial positions left foot. Calf is soft. Left groin incision looks fine.  LABS:   Lab Results  Component Value Date   WBC 10.9 (H) 09/28/2018   HGB 9.7 (L) 09/28/2018   HCT 29.6 (L) 09/28/2018   MCV 98.3 09/28/2018   PLT 208 09/28/2018   Lab Results  Component Value Date   CREATININE 0.86 09/28/2018   Lab Results  Component Value Date   INR 1.0 07/11/2007   PROBLEM LIST:    Principal Problem:   PVD (peripheral vascular disease) ([place]) Active Problems:   PAD (peripheral artery disease) ([facility])  CURRENT MEDS:   . acetaminophen  500 mg Oral TID  . aspirin EC  81 mg Oral Daily  . clopidogrel  75 mg Oral Q breakfast  . docusate sodium  100 mg Oral Daily  . famotidine  40 mg Oral QPM  . metoprolol tartrate  25 mg Oral BID  . pantoprazole  40 mg Oral Daily  . pravastatin  40 mg Oral q1800  . sertraline  25 mg Oral Daily  . sodium chloride flush  3 mL Intravenous Q12H  . sodium chloride flush  3 mL Intravenous Q12H    Deitra Mayo Beeper: [phone] Office: [phone] 09/29/2018

## 2018-09-30 DIAGNOSIS — I10 Essential (primary) hypertension: Secondary | ICD-10-CM | POA: Diagnosis not present

## 2018-09-30 DIAGNOSIS — I739 Peripheral vascular disease, unspecified: Secondary | ICD-10-CM | POA: Diagnosis not present

## 2018-09-30 DIAGNOSIS — E785 Hyperlipidemia, unspecified: Secondary | ICD-10-CM | POA: Diagnosis not present

## 2018-09-30 DIAGNOSIS — R531 Weakness: Secondary | ICD-10-CM | POA: Diagnosis not present

## 2018-10-05 ENCOUNTER — Telehealth (INDEPENDENT_AMBULATORY_CARE_PROVIDER_SITE_OTHER): Payer: Medicare Other

## 2018-10-05 ENCOUNTER — Ambulatory Visit (INDEPENDENT_AMBULATORY_CARE_PROVIDER_SITE_OTHER): Payer: Medicare Other | Admitting: Cardiology

## 2018-10-05 ENCOUNTER — Encounter: Payer: Self-pay | Admitting: Cardiology

## 2018-10-05 ENCOUNTER — Other Ambulatory Visit: Payer: Self-pay

## 2018-10-05 VITALS — BP 163/82 | HR 93 | Ht 64.0 in | Wt 135.0 lb

## 2018-10-05 DIAGNOSIS — L97929 Non-pressure chronic ulcer of unspecified part of left lower leg with unspecified severity: Secondary | ICD-10-CM | POA: Diagnosis not present

## 2018-10-05 DIAGNOSIS — I739 Peripheral vascular disease, unspecified: Secondary | ICD-10-CM | POA: Diagnosis not present

## 2018-10-05 DIAGNOSIS — I10 Essential (primary) hypertension: Secondary | ICD-10-CM | POA: Diagnosis not present

## 2018-10-05 NOTE — Telephone Encounter (Signed)
Daughter called stating that pt had a VV today and you wanted her to get some information from the facility. Pt will finish up with PT tomorrow and then they will work on discharging her on Friday. [facility] will come out and do PT but will need orders from you. Also 24-7 caregiver has been set up also and can start Friday. Social worker @ [facility] is [employer] [phone]

## 2018-10-05 NOTE — Progress Notes (Signed)
Primary Physician:  Lajean Manes, MD   Patient ID: [name], female    DOB: Oct 04, 1930, 85 y.o.   MRN: [id number]  Subjective:    Chief Complaint  Patient presents with  . PAD  . Hypertension  . Follow-up    HPI: [name]  is a 85 y.o. female  with hypertension, hyperlipidemia, spinal stenosis, PAD with left heel ulceration and abnormal LE arterial duplex suggestive of SFA stenosis. She is s/p angioplasty to left SFA on 09/27/18 and noted to have dissection due to severely calcified and heavy plaque burden to left common femoral artery after the procedure, in which she had urgent patch angioplasty with Dr. Doren Custard also on 09/27/18. She now presents for post procedure follow up.  She is now at rehab facility, [facility]. Daughter [name] is present and daughter, [name] is present via face time. She is walking some with assistance from PT and use of walker, unsure of exact distance. Patient states that she has been able to walk some down the hallways.They are concerned about worsening mental state in the facility. She generally lives at home with caregivers through out the day. They are hoping that she can come home soon.  She does notice bruising to her left foot and groin. Groin/thigh is tender. She has not noticed any drainage. She has appt with Dr. Doren Custard next week.  Past Medical History:  Diagnosis Date  . Adenomatous colon polyp 2007  . Arthritis   . Bilateral carotid bruits 07/27/2018  . CAD (coronary artery disease)   . Chronic back pain   . Gall stone pancreatitis   . Heart murmur   . High cholesterol   . Hx: UTI (urinary tract infection)    Now infrequent  . Hypertension   . Left knee pain Nov. 18, 2014  . Onychomycosis   . Osteoarthritis   . Osteopenia   . PAD (peripheral artery disease) ([place])   . PVD (peripheral vascular disease) ([place])   . Skin ulcer of left heel ([place])   . Trigger finger, right   . Vertigo     Past Surgical History:  Procedure  Laterality Date  . ABDOMINAL HYSTERECTOMY     partial  . CHOLECYSTECTOMY  10/30/2011   Procedure: LAPAROSCOPIC CHOLECYSTECTOMY WITH INTRAOPERATIVE CHOLANGIOGRAM;  Surgeon: Zenovia Jarred, MD;  Location: [facility];  Service: General;  Laterality: N/A;  . ENDARTERECTOMY FEMORAL Left 09/27/2018   Procedure: left Endarterectomy Femoral;  Surgeon: Angelia Mould, MD;  Location: [facility];  Service: Vascular;  Laterality: Left;  . EUS  11/11/2011   Procedure: ESOPHAGEAL ENDOSCOPIC ULTRASOUND (EUS) RADIAL;  Surgeon: Arta Silence, MD;  Location: [facility];  Service: Endoscopy;  Laterality: N/A;  possible ERCP  . FEMORAL-POPLITEAL BYPASS GRAFT Left 09/27/2018   Procedure: left FEMORAL THROMBECTOMY;  Surgeon: Angelia Mould, MD;  Location: [facility];  Service: Vascular;  Laterality: Left;  . JOINT REPLACEMENT Left 06-22-06   Hip  . JOINT REPLACEMENT Right 11-26-06   Shoulder  . JOINT REPLACEMENT Right 02-27-06   Knee  . LOWER EXTREMITY ANGIOGRAPHY N/A 09/27/2018   Procedure: LOWER EXTREMITY ANGIOGRAPHY;  Surgeon: Nigel Mormon, MD;  Location: [facility];  Service: Cardiovascular;  Laterality: N/A;  . PATCH ANGIOPLASTY Left 09/27/2018   Procedure: Patch Angioplasty of left femoral artery using xenosure bovine pericardium patch;  Surgeon: Angelia Mould, MD;  Location: [facility];  Service: Vascular;  Laterality: Left;  . PERIPHERAL VASCULAR INTERVENTION  09/27/2018   Procedure:  PERIPHERAL VASCULAR INTERVENTION;  Surgeon: Nigel Mormon, MD;  Location: [facility];  Service: Cardiovascular;;  . TOTAL HIP ARTHROPLASTY    . TOTAL KNEE ARTHROPLASTY    . TOTAL SHOULDER ARTHROPLASTY      Social History   Socioeconomic History  . Marital status: Widowed    Spouse name: Not on file  . Number of children: 5  . Years of education: College  . Highest education level: Not on file  Occupational History  . Not on file  Social Needs  . Financial resource strain: Not on  file  . Food insecurity:    Worry: Not on file    Inability: Not on file  . Transportation needs:    Medical: Not on file    Non-medical: Not on file  Tobacco Use  . Smoking status: Never Smoker  . Smokeless tobacco: Never Used  Substance and Sexual Activity  . Alcohol use: No    Alcohol/week: 0.0 standard drinks  . Drug use: No  . Sexual activity: Not on file  Lifestyle  . Physical activity:    Days per week: Not on file    Minutes per session: Not on file  . Stress: Not on file  Relationships  . Social connections:    Talks on phone: Not on file    Gets together: Not on file    Attends religious service: Not on file    Active member of club or organization: Not on file    Attends meetings of clubs or organizations: Not on file    Relationship status: Not on file  . Intimate partner violence:    Fear of current or ex partner: Not on file    Emotionally abused: Not on file    Physically abused: Not on file    Forced sexual activity: Not on file  Other Topics Concern  . Not on file  Social History Narrative   No caffeine use    Review of Systems  Constitution: Negative for decreased appetite, malaise/fatigue, weight gain and weight loss.  Eyes: Negative for visual disturbance.  Cardiovascular: Negative for chest pain, claudication, dyspnea on exertion, leg swelling, orthopnea, palpitations and syncope.  Respiratory: Negative for hemoptysis and wheezing.   Endocrine: Negative for cold intolerance and heat intolerance.  Hematologic/Lymphatic: Does not bruise/bleed easily.  Skin: Positive for poor wound healing (left heel). Negative for nail changes.  Musculoskeletal: Positive for back pain (chronic AND SEVERE. Lenord Fellers, MD) and joint pain (hip, bilateral knee and severe back pain). Negative for muscle weakness and myalgias.       Decreased Range of Motion -right shoulder.  Physical Disability - walks with a cane.  Gastrointestinal: Negative for abdominal pain, change  in bowel habit, nausea and vomiting.  Neurological: Positive for dizziness (occasional dizziness and unsteadiness). Negative for difficulty with concentration, focal weakness and headaches.  Psychiatric/Behavioral: Negative for altered mental status and suicidal ideas.  All other systems reviewed and are negative.     Objective:  Blood pressure (!) 163/82, pulse 93, height 5\' 4"  (1.626 m), weight 135 lb (61.2 kg), SpO2 96 %. Body mass index is 23.17 kg/m.    Physical Exam  Constitutional: She appears well-developed and well-nourished. No distress.  Petite   HENT:  Head: Atraumatic.  Eyes: Conjunctivae are normal.  Neck: Neck supple. No JVD present. No thyromegaly present.  Cardiovascular: Normal rate, regular rhythm and intact distal pulses. Exam reveals no gallop.  Murmur heard.  Early systolic murmur is present  with a grade of 2/6. Aortic Area  Pulses:      Carotid pulses are on the right side with bruit and on the left side with bruit.      Femoral pulses are 1+ on the right side and 1+ on the left side.      Popliteal pulses are 1+ on the right side and 1+ on the left side.       Dorsalis pedis pulses are 2+ on the right side and 2+ on the left side.       Posterior tibial pulses are 1+ on the right side and 1+ on the left side.  Ecchymosis to left foot Ecchymosis to left and right groin Left groin incision with sutures. C/D/I. No dressing. Left thigh drain site open. No drainage.  Groin is soft, mildly tender Trace R LE edema 1+ L LE edema   Pulmonary/Chest: Effort normal and breath sounds normal.  Abdominal: Soft. Bowel sounds are normal.  Musculoskeletal: Normal range of motion.        General: No edema.  Neurological: She is alert.  Skin: Skin is warm and dry. No cyanosis.  Left heel ulceration that is been present for several months and is non-healing. Currently has dressing that is dry and intact.   Psychiatric: She has a normal mood and affect.  Vitals reviewed.   Radiology: No results found.  Laboratory examination:    CMP Latest Ref Rng & Units 09/28/2018 09/19/2018 12/25/2017  Glucose 70 - 99 mg/dL 191(H) 96 162(H)  BUN 8 - 23 mg/dL 13 18 17   Creatinine 0.44 - 1.00 mg/dL 0.86 1.04(H) 1.21(H)  Sodium 135 - 145 mmol/L 139 141 124(L)  Potassium 3.5 - 5.1 mmol/L 4.2 3.9 3.7  Chloride 98 - 111 mmol/L 106 103 87(L)  CO2 22 - 32 mmol/L 27 25 26   Calcium 8.9 - 10.3 mg/dL 8.2(L) 9.0 8.2(L)  Total Protein 6.5 - 8.1 g/dL - - 6.4(L)  Total Bilirubin 0.3 - 1.2 mg/dL - - 0.5  Alkaline Phos 38 - 126 U/L - - 67  AST 15 - 41 U/L - - 23  ALT 0 - 44 U/L - - 13   CBC Latest Ref Rng & Units 09/28/2018 09/27/2018 09/19/2018  WBC 4.0 - 10.5 K/uL 10.9(H) 12.0(H) 6.7  Hemoglobin 12.0 - 15.0 g/dL 9.7(L) 10.3(L) 12.4  Hematocrit 36.0 - 46.0 % 29.6(L) 31.5(L) 37.2  Platelets 150 - 400 K/uL 208 198 255   Lipid Panel  No results found for: CHOL, TRIG, HDL, CHOLHDL, VLDL, LDLCALC, LDLDIRECT HEMOGLOBIN A1C No results found for: HGBA1C, MPG TSH No results for input(s): TSH in the last 8760 hours.  PRN Meds:. There are no discontinued medications. Current Meds  Medication Sig  . acetaminophen (TYLENOL) 650 MG CR tablet Take 650 mg by mouth 3 (three) times daily.  Marland Kitchen amLODipine (NORVASC) 5 MG tablet Take 5 mg by mouth daily.   Marland Kitchen aspirin 81 MG tablet Take 81 mg by mouth daily.  . Carboxymethylcellulose Sodium (THERATEARS OP) Place 1 drop into the right eye daily as needed (dry eyes).  . cetirizine (ZYRTEC) 10 MG tablet Take 10 mg by mouth daily.  . cholecalciferol (VITAMIN D) 1000 UNITS tablet Take 1,000 Units by mouth daily.  . clindamycin (CLEOCIN) 150 MG capsule Take 600 mg by mouth See admin instructions. Take 600 mg 1 hour prior to dental work  . clopidogrel (PLAVIX) 75 MG tablet Take 1 tablet (75 mg total) by mouth daily. (Patient taking differently: Take 75  mg by mouth at bedtime. )  . diclofenac sodium (VOLTAREN) 1 % GEL Apply 1 application topically 4 (four)  times daily as needed (arthritis pain).  . famotidine (PEPCID) 40 MG tablet Take 40 mg by mouth every evening.  . hydrALAZINE (APRESOLINE) 25 MG tablet Take 25 mg by mouth daily as needed (systolic bp goes over 761).   Marland Kitchen losartan (COZAAR) 100 MG tablet Take 100 mg by mouth daily.  . meclizine (ANTIVERT) 12.5 MG tablet Take 12.5 mg by mouth 3 (three) times daily as needed for dizziness.  . Menthol (ICY HOT) 5 % PTCH Apply 1 patch topically daily as needed (pain).  . nystatin (MYCOSTATIN/NYSTOP) powder Apply 15 g topically daily as needed (rash).  . Pitavastatin Calcium (LIVALO) 2 MG TABS Take 1 mg by mouth every evening.  Marland Kitchen PRESCRIPTION MEDICATION Apply 1 application topically 3 (three) times daily as needed (back pain). Baclofen 5%, Diclofenac 3%, Gabapentin 6% mix 1g with 2g of lidocaine  . sertraline (ZOLOFT) 25 MG tablet Take 25 mg by mouth daily.  . traMADol (ULTRAM) 50 MG tablet Take 25 mg by mouth 3 (three) times daily.     Cardiac Studies:   Vasc US with ABI 09/29/2018:  Right: Resting right ankle-brachial index is within normal range. No evidence of significant right lower extremity arterial disease. Left: Resting left ankle-brachial index is within normal range. No evidence of significant left lower extremity arterial disease.  Peripheral arteriogram and angioplasty to CTO left SFA 09/27/2018: 1. Ultrasound guided right common femoral artery and left anterior tibial artery access 2. Abdominal aortogram 3. Crossover technique into left common iliac artery 4. Nonselective bilateral lower extremity distal runoff 5. Selective left lower extremity runoff 6. Successful PTCA and stenting Lt SFA with 3 overlapping stents     5.0 X 150 mm, 5.0 X 120 mm, 5.0 X 60 mm Innova self exapanding stents from distal to ostial Lt SFA.  7. Hemostasis 6/7 Fr Mynx closure right common femoral artery 8. Conscious sedation monitoring 228 min  Echocardiogram 10/21/2016: Left ventricle cavity is normal  in size. Mild concentric hypertrophy of the left ventricle. Normal global wall motion. Doppler evidence of grade II (pseudonormal) diastolic dysfunction. Diastolic dysfunction do not suggest elevated LA/LV endiastolic pressure. Calculated EF 55%. Left atrial cavity is mildly dilated by volume. Right atrial cavity is mildly dilated. Mild aortic valve leaflet calcification. Mildly restricted aortic valve leaflets. Trace aortic valve stenosis. Aortic valve peak pressure gradient of 17 and mean gradient of 7 mmHg, calculated aortic valve area 1.97 cm. Mild (Grade I) aortic regurgitation. Mild to moderate mitral regurgitation. Mild to moderate tricuspid regurgitation. Moderate pulmonary hypertension. Pulmonary artery systolic pressure is estimated at 40 mm Hg. Insignificant pericardial effusion. IVC is dilated with poor inspiration collapse consistent with elevated right atrial pressure.  Lexiscan myoview stress test 10/19/2016: 1. The resting electrocardiogram demonstrated normal sinus rhythm, normal resting conduction, no resting arrhythmias and normal rest repolarization. Resting EKG shows NSR. Stress EKG is non-diagnostic for ischemia as it a pharmacologic stress using Lexiscan. Stress symptoms included dyspnea. There was rare PVC and PAC. 2. Myocardial perfusion imaging is normal. Overall left ventricular systolic function was normal without regional wall motion abnormalities. The left ventricular ejection fraction was 74%.  Carotid artery duplex 02/24/2018: Bilateral carotid arteries show mild heterogeneous plaque without stenosis. Antegrade right vertebral artery flow. Antegrade left vertebral artery flow. No significant change since 02/19/2016.   Assessment:   PAD (peripheral artery disease) ([facility]) s/p angioplasty and repair and  endarterectomy on 09/27/2018 - Plan: Ambulatory referral to [facility] extremity ulceration, left, with unspecified severity ([place])  Essential hypertension   EKG 07/27/2018: Normal sinus rhythm at 77 bpm, left atrial abnormality, normal axis, no evidence of ischemia.   Recommendations:   Patient underwent complex angioplasty to left SFA on 05/12 and noted to have dissection to left femoral at the end of the case. She urgently went for left femoral endarterectomy with bovine pericardial patch angioplasty with Dr. Scot Dock. She has recovered well. She has bounding DP pulse on exam. No evidence of ischemia. Ecchymosis to left foot related to revascularization. Ecchymosis to bilateral groin is soft without evidence of hematoma or infection to surgical site. She will continue with ASA and Plavix for at least approximately 6 months to 1 year. She will also be seeing Dr. Scot Dock next week for follow up. Left heel ulceration only now shows skin breakdown and according to her daughters, who were caring for her wound, prior to surgery, heel has improved. Continue with foam dressing to area.  Blood pressure is elevated in our office today, but is overall stable generally. Patient was slightly agitated initially in our office and suspect may be contributing to her hypertension. Will continue with current medications and continue to closely monitor. She has had hyponatremia in the past with HCTZ.   Daughters are concerned about her worsening mental state since being in the facility, which I have explained is common with dementia patients. Patient and her daughters both verbalize that they wish for her to be at home. She has an excellent support system from her 2 daughters and also has ability to arrange for 24 hour care with caregivers. I would recommend that she continue with PT to increase her stamina and endurance that has been affected from surgery and procedure for her safety with ambulation around her house and decrease her chances of fall. Prior to surgery, patient was able to walk into our office with assistance of cane or walker. She would use a wheelchair for long  going long distances if needed. She also has chronic back pain from scoliosis that contributes to her physical disabilities. She is now in a wheelchair today in our office, but has been ambulating some with PT down the halls at rehab facility. Will arrange for [facility] PT services to come evaluate patient and to continue with her therapy needs. Extensive discussion with the patient and her daughters regarding safety measures and plan of care. Patient and daughters were both agreeable to plan. I will plan for virtual visit in 4 weeks, but encouraged sooner follow up if needed.    *I have discussed this case with Dr. Virgina Jock and he personally examined the patient and participated in formulating the plan.*    Miquel Dunn, MSN, APRN, FNP-C [facility]. [place] Office: [phone] Fax: [phone]

## 2018-10-06 ENCOUNTER — Encounter: Payer: Self-pay | Admitting: Cardiology

## 2018-10-06 ENCOUNTER — Other Ambulatory Visit: Payer: Self-pay | Admitting: Cardiology

## 2018-10-06 NOTE — Telephone Encounter (Signed)
I dont think so. She was more so calling as an FYI that these things had been taken care of.

## 2018-10-08 DIAGNOSIS — Z96651 Presence of right artificial knee joint: Secondary | ICD-10-CM | POA: Diagnosis not present

## 2018-10-08 DIAGNOSIS — L97421 Non-pressure chronic ulcer of left heel and midfoot limited to breakdown of skin: Secondary | ICD-10-CM | POA: Diagnosis not present

## 2018-10-08 DIAGNOSIS — Z96642 Presence of left artificial hip joint: Secondary | ICD-10-CM | POA: Diagnosis not present

## 2018-10-08 DIAGNOSIS — I272 Pulmonary hypertension, unspecified: Secondary | ICD-10-CM | POA: Diagnosis not present

## 2018-10-08 DIAGNOSIS — Z48812 Encounter for surgical aftercare following surgery on the circulatory system: Secondary | ICD-10-CM | POA: Diagnosis not present

## 2018-10-08 DIAGNOSIS — I2510 Atherosclerotic heart disease of native coronary artery without angina pectoris: Secondary | ICD-10-CM | POA: Diagnosis not present

## 2018-10-08 DIAGNOSIS — G301 Alzheimer's disease with late onset: Secondary | ICD-10-CM | POA: Diagnosis not present

## 2018-10-08 DIAGNOSIS — F0280 Dementia in other diseases classified elsewhere without behavioral disturbance: Secondary | ICD-10-CM | POA: Diagnosis not present

## 2018-10-08 DIAGNOSIS — I1310 Hypertensive heart and chronic kidney disease without heart failure, with stage 1 through stage 4 chronic kidney disease, or unspecified chronic kidney disease: Secondary | ICD-10-CM | POA: Diagnosis not present

## 2018-10-08 DIAGNOSIS — N183 Chronic kidney disease, stage 3 (moderate): Secondary | ICD-10-CM | POA: Diagnosis not present

## 2018-10-08 DIAGNOSIS — M48061 Spinal stenosis, lumbar region without neurogenic claudication: Secondary | ICD-10-CM | POA: Diagnosis not present

## 2018-10-08 DIAGNOSIS — M6281 Muscle weakness (generalized): Secondary | ICD-10-CM | POA: Diagnosis not present

## 2018-10-08 DIAGNOSIS — Z96611 Presence of right artificial shoulder joint: Secondary | ICD-10-CM | POA: Diagnosis not present

## 2018-10-08 DIAGNOSIS — R2689 Other abnormalities of gait and mobility: Secondary | ICD-10-CM | POA: Diagnosis not present

## 2018-10-08 DIAGNOSIS — I70244 Atherosclerosis of native arteries of left leg with ulceration of heel and midfoot: Secondary | ICD-10-CM | POA: Diagnosis not present

## 2018-10-08 DIAGNOSIS — M150 Primary generalized (osteo)arthritis: Secondary | ICD-10-CM | POA: Diagnosis not present

## 2018-10-12 ENCOUNTER — Encounter: Payer: Self-pay | Admitting: Vascular Surgery

## 2018-10-12 ENCOUNTER — Other Ambulatory Visit: Payer: Self-pay

## 2018-10-12 ENCOUNTER — Ambulatory Visit (INDEPENDENT_AMBULATORY_CARE_PROVIDER_SITE_OTHER): Payer: Self-pay | Admitting: Vascular Surgery

## 2018-10-12 VITALS — BP 129/75 | HR 78 | Temp 98.3°F | Resp 14 | Ht 64.0 in | Wt 135.0 lb

## 2018-10-12 DIAGNOSIS — I2510 Atherosclerotic heart disease of native coronary artery without angina pectoris: Secondary | ICD-10-CM | POA: Diagnosis not present

## 2018-10-12 DIAGNOSIS — M150 Primary generalized (osteo)arthritis: Secondary | ICD-10-CM | POA: Diagnosis not present

## 2018-10-12 DIAGNOSIS — M48061 Spinal stenosis, lumbar region without neurogenic claudication: Secondary | ICD-10-CM | POA: Diagnosis not present

## 2018-10-12 DIAGNOSIS — Z48812 Encounter for surgical aftercare following surgery on the circulatory system: Secondary | ICD-10-CM | POA: Diagnosis not present

## 2018-10-12 DIAGNOSIS — L97421 Non-pressure chronic ulcer of left heel and midfoot limited to breakdown of skin: Secondary | ICD-10-CM | POA: Diagnosis not present

## 2018-10-12 DIAGNOSIS — I70244 Atherosclerosis of native arteries of left leg with ulceration of heel and midfoot: Secondary | ICD-10-CM | POA: Diagnosis not present

## 2018-10-12 DIAGNOSIS — I739 Peripheral vascular disease, unspecified: Secondary | ICD-10-CM

## 2018-10-12 NOTE — Progress Notes (Signed)
Patient name: [name] MRN: [id number] DOB: 1931/05/16 Sex: female  REASON FOR VISIT:   Follow-up after left femoral thrombectomy  HPI:   [name] is a pleasant 85 y.o. female who had presented with an ischemic left lower extremity.  She had undergone a complex endovascular intervention earlier in the day by cardiology and there was some concern about dissection or thrombus in her left common femoral artery.  When I saw her in consultation she had no Doppler flow in the foot was taken urgently to the operating room.  She underwent left femoral thrombectomy with left femoral endarterectomy and bovine pericardial patch angioplasty.  She returns for a follow-up visit.  Overall she is doing well.  She has no specific complaints.  Current Outpatient Medications  Medication Sig Dispense Refill  . acetaminophen (TYLENOL) 650 MG CR tablet Take 650 mg by mouth 3 (three) times daily.    Marland Kitchen amLODipine (NORVASC) 5 MG tablet Take 1 tablet (5 mg total) by mouth daily. 90 tablet 1  . aspirin 81 MG tablet Take 81 mg by mouth daily.    . Carboxymethylcellulose Sodium (THERATEARS OP) Place 1 drop into the right eye daily as needed (dry eyes).    . cetirizine (ZYRTEC) 10 MG tablet Take 10 mg by mouth daily.    . cholecalciferol (VITAMIN D) 1000 UNITS tablet Take 1,000 Units by mouth daily.    . clindamycin (CLEOCIN) 150 MG capsule Take 600 mg by mouth See admin instructions. Take 600 mg 1 hour prior to dental work    . clopidogrel (PLAVIX) 75 MG tablet Take 1 tablet (75 mg total) by mouth daily. (Patient taking differently: Take 75 mg by mouth at bedtime. ) 30 tablet 1  . diclofenac sodium (VOLTAREN) 1 % GEL Apply 1 application topically 4 (four) times daily as needed (arthritis pain).    . famotidine (PEPCID) 40 MG tablet Take 40 mg by mouth every evening.    . hydrALAZINE (APRESOLINE) 25 MG tablet Take 25 mg by mouth daily as needed (systolic bp goes over 976).     Marland Kitchen losartan (COZAAR) 100 MG  tablet Take 100 mg by mouth daily.    . meclizine (ANTIVERT) 12.5 MG tablet Take 12.5 mg by mouth 3 (three) times daily as needed for dizziness.    . Menthol (ICY HOT) 5 % PTCH Apply 1 patch topically daily as needed (pain).    . nystatin (MYCOSTATIN/NYSTOP) powder Apply 15 g topically daily as needed (rash).    . Pitavastatin Calcium (LIVALO) 2 MG TABS Take 1 mg by mouth every evening.    Marland Kitchen PRESCRIPTION MEDICATION Apply 1 application topically 3 (three) times daily as needed (back pain). Baclofen 5%, Diclofenac 3%, Gabapentin 6% mix 1g with 2g of lidocaine    . sertraline (ZOLOFT) 25 MG tablet Take 25 mg by mouth daily.    . traMADol (ULTRAM) 50 MG tablet Take 25 mg by mouth 3 (three) times daily.      No current facility-administered medications for this visit.     REVIEW OF SYSTEMS:  [X]  denotes positive finding, [ ]  denotes negative finding Vascular    Leg swelling x   Cardiac    Chest pain or chest pressure:    Shortness of breath upon exertion:    Short of breath when lying flat:    Irregular heart rhythm:    Constitutional    Fever or chills:     PHYSICAL EXAM:   Vitals:   10/12/18  1015  BP: 129/75  Pulse: 78  Resp: 14  Temp: 98.3 F (36.8 C)  TempSrc: Oral  SpO2: 98%  Weight: 135 lb (61.2 kg)  Height: 5\' 4"  (1.626 m)    GENERAL: The patient is a well-nourished female, in no acute distress. The vital signs are documented above. CARDIOVASCULAR: There is a regular rate and rhythm. PULMONARY: There is good air exchange bilaterally without wheezing or rales. Her groin incision is healing nicely. She has a palpable femoral and dorsalis pedis pulse on the left.  DATA:   No new data.  MEDICAL ISSUES:   STATUS POST LEFT FEMORAL THROMBECTOMY: The patient is doing well status post left femoral thrombectomy with endarterectomy and bovine pericardial patch angioplasty.  Her incision is healing nicely.  Dr. Einar Gip had an excellent result from his complex endovascular  intervention and she has a palpable dorsalis pedis pulse.  She will continue her follow-up with him.  I will be happy to see her back at any time.  Deitra Mayo [facility] [phone]

## 2018-10-14 DIAGNOSIS — M150 Primary generalized (osteo)arthritis: Secondary | ICD-10-CM | POA: Diagnosis not present

## 2018-10-14 DIAGNOSIS — I2510 Atherosclerotic heart disease of native coronary artery without angina pectoris: Secondary | ICD-10-CM | POA: Diagnosis not present

## 2018-10-14 DIAGNOSIS — M48061 Spinal stenosis, lumbar region without neurogenic claudication: Secondary | ICD-10-CM | POA: Diagnosis not present

## 2018-10-14 DIAGNOSIS — Z48812 Encounter for surgical aftercare following surgery on the circulatory system: Secondary | ICD-10-CM | POA: Diagnosis not present

## 2018-10-14 DIAGNOSIS — L97421 Non-pressure chronic ulcer of left heel and midfoot limited to breakdown of skin: Secondary | ICD-10-CM | POA: Diagnosis not present

## 2018-10-14 DIAGNOSIS — I70244 Atherosclerosis of native arteries of left leg with ulceration of heel and midfoot: Secondary | ICD-10-CM | POA: Diagnosis not present

## 2018-10-18 DIAGNOSIS — I2510 Atherosclerotic heart disease of native coronary artery without angina pectoris: Secondary | ICD-10-CM | POA: Diagnosis not present

## 2018-10-18 DIAGNOSIS — I70244 Atherosclerosis of native arteries of left leg with ulceration of heel and midfoot: Secondary | ICD-10-CM | POA: Diagnosis not present

## 2018-10-18 DIAGNOSIS — L97421 Non-pressure chronic ulcer of left heel and midfoot limited to breakdown of skin: Secondary | ICD-10-CM | POA: Diagnosis not present

## 2018-10-18 DIAGNOSIS — Z48812 Encounter for surgical aftercare following surgery on the circulatory system: Secondary | ICD-10-CM | POA: Diagnosis not present

## 2018-10-18 DIAGNOSIS — M48061 Spinal stenosis, lumbar region without neurogenic claudication: Secondary | ICD-10-CM | POA: Diagnosis not present

## 2018-10-18 DIAGNOSIS — M150 Primary generalized (osteo)arthritis: Secondary | ICD-10-CM | POA: Diagnosis not present

## 2018-10-20 DIAGNOSIS — Z48812 Encounter for surgical aftercare following surgery on the circulatory system: Secondary | ICD-10-CM | POA: Diagnosis not present

## 2018-10-20 DIAGNOSIS — I2510 Atherosclerotic heart disease of native coronary artery without angina pectoris: Secondary | ICD-10-CM | POA: Diagnosis not present

## 2018-10-20 DIAGNOSIS — M150 Primary generalized (osteo)arthritis: Secondary | ICD-10-CM | POA: Diagnosis not present

## 2018-10-20 DIAGNOSIS — I70244 Atherosclerosis of native arteries of left leg with ulceration of heel and midfoot: Secondary | ICD-10-CM | POA: Diagnosis not present

## 2018-10-20 DIAGNOSIS — M48061 Spinal stenosis, lumbar region without neurogenic claudication: Secondary | ICD-10-CM | POA: Diagnosis not present

## 2018-10-20 DIAGNOSIS — L97421 Non-pressure chronic ulcer of left heel and midfoot limited to breakdown of skin: Secondary | ICD-10-CM | POA: Diagnosis not present

## 2018-10-20 NOTE — Telephone Encounter (Signed)
Home health care orders placed on plan signed off

## 2018-10-21 DIAGNOSIS — I2510 Atherosclerotic heart disease of native coronary artery without angina pectoris: Secondary | ICD-10-CM | POA: Diagnosis not present

## 2018-10-21 DIAGNOSIS — L97421 Non-pressure chronic ulcer of left heel and midfoot limited to breakdown of skin: Secondary | ICD-10-CM | POA: Diagnosis not present

## 2018-10-21 DIAGNOSIS — M48061 Spinal stenosis, lumbar region without neurogenic claudication: Secondary | ICD-10-CM | POA: Diagnosis not present

## 2018-10-21 DIAGNOSIS — Z48812 Encounter for surgical aftercare following surgery on the circulatory system: Secondary | ICD-10-CM | POA: Diagnosis not present

## 2018-10-21 DIAGNOSIS — M150 Primary generalized (osteo)arthritis: Secondary | ICD-10-CM | POA: Diagnosis not present

## 2018-10-21 DIAGNOSIS — I70244 Atherosclerosis of native arteries of left leg with ulceration of heel and midfoot: Secondary | ICD-10-CM | POA: Diagnosis not present

## 2018-10-25 DIAGNOSIS — M150 Primary generalized (osteo)arthritis: Secondary | ICD-10-CM | POA: Diagnosis not present

## 2018-10-25 DIAGNOSIS — M48061 Spinal stenosis, lumbar region without neurogenic claudication: Secondary | ICD-10-CM | POA: Diagnosis not present

## 2018-10-25 DIAGNOSIS — L97421 Non-pressure chronic ulcer of left heel and midfoot limited to breakdown of skin: Secondary | ICD-10-CM | POA: Diagnosis not present

## 2018-10-25 DIAGNOSIS — Z48812 Encounter for surgical aftercare following surgery on the circulatory system: Secondary | ICD-10-CM | POA: Diagnosis not present

## 2018-10-25 DIAGNOSIS — I70244 Atherosclerosis of native arteries of left leg with ulceration of heel and midfoot: Secondary | ICD-10-CM | POA: Diagnosis not present

## 2018-10-25 DIAGNOSIS — I2510 Atherosclerotic heart disease of native coronary artery without angina pectoris: Secondary | ICD-10-CM | POA: Diagnosis not present

## 2018-10-28 DIAGNOSIS — Z48812 Encounter for surgical aftercare following surgery on the circulatory system: Secondary | ICD-10-CM | POA: Diagnosis not present

## 2018-10-28 DIAGNOSIS — M48061 Spinal stenosis, lumbar region without neurogenic claudication: Secondary | ICD-10-CM | POA: Diagnosis not present

## 2018-10-28 DIAGNOSIS — M150 Primary generalized (osteo)arthritis: Secondary | ICD-10-CM | POA: Diagnosis not present

## 2018-10-28 DIAGNOSIS — I70244 Atherosclerosis of native arteries of left leg with ulceration of heel and midfoot: Secondary | ICD-10-CM | POA: Diagnosis not present

## 2018-10-28 DIAGNOSIS — L97421 Non-pressure chronic ulcer of left heel and midfoot limited to breakdown of skin: Secondary | ICD-10-CM | POA: Diagnosis not present

## 2018-10-28 DIAGNOSIS — I2510 Atherosclerotic heart disease of native coronary artery without angina pectoris: Secondary | ICD-10-CM | POA: Diagnosis not present

## 2018-10-31 DIAGNOSIS — M150 Primary generalized (osteo)arthritis: Secondary | ICD-10-CM | POA: Diagnosis not present

## 2018-10-31 DIAGNOSIS — M48061 Spinal stenosis, lumbar region without neurogenic claudication: Secondary | ICD-10-CM | POA: Diagnosis not present

## 2018-10-31 DIAGNOSIS — I2510 Atherosclerotic heart disease of native coronary artery without angina pectoris: Secondary | ICD-10-CM | POA: Diagnosis not present

## 2018-10-31 DIAGNOSIS — I70244 Atherosclerosis of native arteries of left leg with ulceration of heel and midfoot: Secondary | ICD-10-CM | POA: Diagnosis not present

## 2018-10-31 DIAGNOSIS — L97421 Non-pressure chronic ulcer of left heel and midfoot limited to breakdown of skin: Secondary | ICD-10-CM | POA: Diagnosis not present

## 2018-10-31 DIAGNOSIS — Z48812 Encounter for surgical aftercare following surgery on the circulatory system: Secondary | ICD-10-CM | POA: Diagnosis not present

## 2018-11-03 ENCOUNTER — Ambulatory Visit: Payer: Medicare Other | Admitting: Cardiology

## 2018-11-04 DIAGNOSIS — Z48812 Encounter for surgical aftercare following surgery on the circulatory system: Secondary | ICD-10-CM | POA: Diagnosis not present

## 2018-11-04 DIAGNOSIS — I2510 Atherosclerotic heart disease of native coronary artery without angina pectoris: Secondary | ICD-10-CM | POA: Diagnosis not present

## 2018-11-04 DIAGNOSIS — L97421 Non-pressure chronic ulcer of left heel and midfoot limited to breakdown of skin: Secondary | ICD-10-CM | POA: Diagnosis not present

## 2018-11-04 DIAGNOSIS — I70244 Atherosclerosis of native arteries of left leg with ulceration of heel and midfoot: Secondary | ICD-10-CM | POA: Diagnosis not present

## 2018-11-04 DIAGNOSIS — M48061 Spinal stenosis, lumbar region without neurogenic claudication: Secondary | ICD-10-CM | POA: Diagnosis not present

## 2018-11-04 DIAGNOSIS — M150 Primary generalized (osteo)arthritis: Secondary | ICD-10-CM | POA: Diagnosis not present

## 2018-11-05 ENCOUNTER — Other Ambulatory Visit: Payer: Self-pay | Admitting: Cardiology

## 2018-11-07 ENCOUNTER — Other Ambulatory Visit: Payer: Self-pay

## 2018-11-07 ENCOUNTER — Ambulatory Visit (INDEPENDENT_AMBULATORY_CARE_PROVIDER_SITE_OTHER): Payer: Medicare Other | Admitting: Cardiology

## 2018-11-07 ENCOUNTER — Encounter: Payer: Self-pay | Admitting: Cardiology

## 2018-11-07 VITALS — BP 103/61 | HR 85 | Ht 64.0 in | Wt 137.3 lb

## 2018-11-07 DIAGNOSIS — I739 Peripheral vascular disease, unspecified: Secondary | ICD-10-CM | POA: Diagnosis not present

## 2018-11-07 DIAGNOSIS — F0280 Dementia in other diseases classified elsewhere without behavioral disturbance: Secondary | ICD-10-CM | POA: Diagnosis not present

## 2018-11-07 DIAGNOSIS — I2510 Atherosclerotic heart disease of native coronary artery without angina pectoris: Secondary | ICD-10-CM | POA: Diagnosis not present

## 2018-11-07 DIAGNOSIS — Z96611 Presence of right artificial shoulder joint: Secondary | ICD-10-CM | POA: Diagnosis not present

## 2018-11-07 DIAGNOSIS — I10 Essential (primary) hypertension: Secondary | ICD-10-CM

## 2018-11-07 DIAGNOSIS — Z96642 Presence of left artificial hip joint: Secondary | ICD-10-CM | POA: Diagnosis not present

## 2018-11-07 DIAGNOSIS — Z96651 Presence of right artificial knee joint: Secondary | ICD-10-CM | POA: Diagnosis not present

## 2018-11-07 DIAGNOSIS — L97421 Non-pressure chronic ulcer of left heel and midfoot limited to breakdown of skin: Secondary | ICD-10-CM | POA: Diagnosis not present

## 2018-11-07 DIAGNOSIS — N183 Chronic kidney disease, stage 3 (moderate): Secondary | ICD-10-CM | POA: Diagnosis not present

## 2018-11-07 DIAGNOSIS — I70244 Atherosclerosis of native arteries of left leg with ulceration of heel and midfoot: Secondary | ICD-10-CM | POA: Diagnosis not present

## 2018-11-07 DIAGNOSIS — I1310 Hypertensive heart and chronic kidney disease without heart failure, with stage 1 through stage 4 chronic kidney disease, or unspecified chronic kidney disease: Secondary | ICD-10-CM | POA: Diagnosis not present

## 2018-11-07 DIAGNOSIS — M6281 Muscle weakness (generalized): Secondary | ICD-10-CM | POA: Diagnosis not present

## 2018-11-07 DIAGNOSIS — Z48812 Encounter for surgical aftercare following surgery on the circulatory system: Secondary | ICD-10-CM | POA: Diagnosis not present

## 2018-11-07 DIAGNOSIS — L97929 Non-pressure chronic ulcer of unspecified part of left lower leg with unspecified severity: Secondary | ICD-10-CM

## 2018-11-07 DIAGNOSIS — I272 Pulmonary hypertension, unspecified: Secondary | ICD-10-CM | POA: Diagnosis not present

## 2018-11-07 DIAGNOSIS — M48061 Spinal stenosis, lumbar region without neurogenic claudication: Secondary | ICD-10-CM | POA: Diagnosis not present

## 2018-11-07 DIAGNOSIS — R2689 Other abnormalities of gait and mobility: Secondary | ICD-10-CM | POA: Diagnosis not present

## 2018-11-07 DIAGNOSIS — G301 Alzheimer's disease with late onset: Secondary | ICD-10-CM | POA: Diagnosis not present

## 2018-11-07 DIAGNOSIS — M150 Primary generalized (osteo)arthritis: Secondary | ICD-10-CM | POA: Diagnosis not present

## 2018-11-07 NOTE — Progress Notes (Signed)
Primary Physician:  Lajean Manes, MD   Patient ID: [name], female    DOB: [birth year], 85 y.o.   MRN: [id number]  Subjective:    Chief Complaint  Patient presents with  . Hypertension  . PAD  . Follow-up   This visit type was conducted due to national recommendations for restrictions regarding the COVID-19 Pandemic (e.g. social distancing).  This format is felt to be most appropriate for this patient at this time.  All issues noted in this document were discussed and addressed.  No physical exam was performed (except for noted visual exam findings with Telehealth visits).  The patient has consented to conduct a Telehealth visit and understands insurance will be billed.   I discussed the limitations of evaluation and management by telemedicine and the availability of in person appointments. The patient expressed understanding and agreed to proceed.  Virtual Visit via Video Note is as below  I connected with Ms. Sandquist, on 11/07/18 at 1035 by a video enabled telemedicine application and verified that I am speaking with the correct person using two identifiers.     I have discussed with her regarding the safety during COVID Pandemic and steps and precautions including social distancing with the patient.    HPI: ANABEL LYKINS  is a 85 y.o. female  with hypertension, hyperlipidemia, spinal stenosis, PAD with left heel ulceration and abnormal LE arterial duplex suggestive of SFA stenosis. She is s/p angioplasty to left SFA on 09/27/18 and noted to have dissection due to severely calcified and heavy plaque burden to left common femoral artery after the procedure, in which she had urgent patch angioplasty with Dr. Doren Custard also on 09/27/18.   Patient is now at home with caregivers. She has now stopped PT as of last week. States her strength has improved, but does still have some weakness and balance issues. She stopped due to worsening back pain with PT, but does still do some home  exercises. Daughters are contemplating putting her back in assisted living due to cost issues.    Both daughters are present for call today. State that her leg and groin area have improved and look well. She has some mild tenderness to her left groin. No drainage or swelling to the site. States that left heel wound has almost completely healed, now with only skin peeling. Wound care continues to follow.   Blood pressure was elevated at her last office visit. She has been monitoring at home and overall stable. She does report having to use hydralazine a few nights ago.   Past Medical History:  Diagnosis Date  . Adenomatous colon polyp 2007  . Arthritis   . Bilateral carotid bruits 07/27/2018  . CAD (coronary artery disease)   . Chronic back pain   . Gall stone pancreatitis   . Heart murmur   . High cholesterol   . Hx: UTI (urinary tract infection)    Now infrequent  . Hypertension   . Left knee pain Nov. 18, 2014  . Onychomycosis   . Osteoarthritis   . Osteopenia   . PAD (peripheral artery disease) ([place])   . PVD (peripheral vascular disease) ([place])   . Skin ulcer of left heel ([place])   . Trigger finger, right   . Vertigo     Past Surgical History:  Procedure Laterality Date  . ABDOMINAL HYSTERECTOMY     partial  . CHOLECYSTECTOMY  10/30/2011   Procedure: LAPAROSCOPIC CHOLECYSTECTOMY WITH INTRAOPERATIVE CHOLANGIOGRAM;  Surgeon: Zenovia Jarred,  MD;  Location: [place];  Service: General;  Laterality: N/A;  . ENDARTERECTOMY FEMORAL Left 09/27/2018   Procedure: left Endarterectomy Femoral;  Surgeon: Angelia Mould, MD;  Location: [facility];  Service: Vascular;  Laterality: Left;  . EUS  11/11/2011   Procedure: ESOPHAGEAL ENDOSCOPIC ULTRASOUND (EUS) RADIAL;  Surgeon: Arta Silence, MD;  Location: [facility];  Service: Endoscopy;  Laterality: N/A;  possible ERCP  . FEMORAL-POPLITEAL BYPASS GRAFT Left 09/27/2018   Procedure: left FEMORAL THROMBECTOMY;  Surgeon: Angelia Mould,  MD;  Location: [place];  Service: Vascular;  Laterality: Left;  . JOINT REPLACEMENT Left 06-22-06   Hip  . JOINT REPLACEMENT Right 11-26-06   Shoulder  . JOINT REPLACEMENT Right 02-27-06   Knee  . LOWER EXTREMITY ANGIOGRAPHY N/A 09/27/2018   Procedure: LOWER EXTREMITY ANGIOGRAPHY;  Surgeon: Nigel Mormon, MD;  Location: [place] [facility];  Service: Cardiovascular;  Laterality: N/A;  . PATCH ANGIOPLASTY Left 09/27/2018   Procedure: Patch Angioplasty of left femoral artery using xenosure bovine pericardium patch;  Surgeon: Angelia Mould, MD;  Location: [place];  Service: Vascular;  Laterality: Left;  . PERIPHERAL VASCULAR INTERVENTION  09/27/2018   Procedure: PERIPHERAL VASCULAR INTERVENTION;  Surgeon: Nigel Mormon, MD;  Location: [facility];  Service: Cardiovascular;;  . TOTAL HIP ARTHROPLASTY    . TOTAL KNEE ARTHROPLASTY    . TOTAL SHOULDER ARTHROPLASTY      Social History   Socioeconomic History  . Marital status: Widowed    Spouse name: Not on file  . Number of children: 5  . Years of education: College  . Highest education level: Not on file  Occupational History  . Not on file  Social Needs  . Financial resource strain: Not on file  . Food insecurity    Worry: Not on file    Inability: Not on file  . Transportation needs    Medical: Not on file    Non-medical: Not on file  Tobacco Use  . Smoking status: Never Smoker  . Smokeless tobacco: Never Used  Substance and Sexual Activity  . Alcohol use: No    Alcohol/week: 0.0 standard drinks  . Drug use: No  . Sexual activity: Not on file  Lifestyle  . Physical activity    Days per week: Not on file    Minutes per session: Not on file  . Stress: Not on file  Relationships  . Social Herbalist on phone: Not on file    Gets together: Not on file    Attends religious service: Not on file    Active member of club or organization: Not on file    Attends meetings of clubs or organizations:  Not on file    Relationship status: Not on file  . Intimate partner violence    Fear of current or ex partner: Not on file    Emotionally abused: Not on file    Physically abused: Not on file    Forced sexual activity: Not on file  Other Topics Concern  . Not on file  Social History Narrative   No caffeine use    Review of Systems  Constitution: Negative for decreased appetite, malaise/fatigue, weight gain and weight loss.  Eyes: Negative for visual disturbance.  Cardiovascular: Negative for chest pain, claudication, dyspnea on exertion, leg swelling, orthopnea, palpitations and syncope.  Respiratory: Negative for hemoptysis and wheezing.   Endocrine: Negative for cold intolerance and heat intolerance.  Hematologic/Lymphatic: Does not  bruise/bleed easily.  Skin: Positive for poor wound healing (left heel). Negative for nail changes.  Musculoskeletal: Positive for back pain (chronic AND SEVERE. Lenord Fellers, MD) and joint pain (hip, bilateral knee and severe back pain). Negative for muscle weakness and myalgias.       Decreased Range of Motion -right shoulder.  Physical Disability - walks with a cane.  Gastrointestinal: Negative for abdominal pain, change in bowel habit, nausea and vomiting.  Neurological: Positive for dizziness (occasional dizziness and unsteadiness). Negative for difficulty with concentration, focal weakness and headaches.  Psychiatric/Behavioral: Negative for altered mental status and suicidal ideas.  All other systems reviewed and are negative.     Objective:  Blood pressure 103/61, pulse 85, height 5\' 4"  (1.626 m), weight 137 lb 4.8 oz (62.3 kg). Body mass index is 23.57 kg/m.    Physical exam not performed or limited due to virtual visit.  Patient appeared to be in no distress, Neck was supple, respiration was not labored.  Please see exam details from prior visit is as below.   Physical Exam  Constitutional: She appears well-developed and well-nourished.  No distress.  Petite   HENT:  Head: Atraumatic.  Eyes: Conjunctivae are normal.  Neck: Neck supple. No JVD present. No thyromegaly present.  Cardiovascular: Normal rate, regular rhythm and intact distal pulses. Exam reveals no gallop.  Murmur heard.  Early systolic murmur is present with a grade of 2/6. Aortic Area  Pulses:      Carotid pulses are on the right side with bruit and on the left side with bruit.      Femoral pulses are 1+ on the right side and 1+ on the left side.      Popliteal pulses are 1+ on the right side and 1+ on the left side.       Dorsalis pedis pulses are 2+ on the right side and 2+ on the left side.       Posterior tibial pulses are 1+ on the right side and 1+ on the left side.  Ecchymosis to left foot Ecchymosis to left and right groin Left groin incision with sutures. C/D/I. No dressing. Left thigh drain site open. No drainage.  Groin is soft, mildly tender Trace R LE edema 1+ L LE edema   Pulmonary/Chest: Effort normal and breath sounds normal.  Abdominal: Soft. Bowel sounds are normal.  Musculoskeletal: Normal range of motion.        General: No edema.  Neurological: She is alert.  Skin: Skin is warm and dry. No cyanosis.  Left heel ulceration that is been present for several months and is non-healing. Currently has dressing that is dry and intact.   Psychiatric: She has a normal mood and affect.  Vitals reviewed.  Radiology: No results found.  Laboratory examination:    CMP Latest Ref Rng & Units 09/28/2018 09/19/2018 12/25/2017  Glucose 70 - 99 mg/dL 191(H) 96 162(H)  BUN 8 - 23 mg/dL 13 18 17   Creatinine 0.44 - 1.00 mg/dL 0.86 1.04(H) 1.21(H)  Sodium 135 - 145 mmol/L 139 141 124(L)  Potassium 3.5 - 5.1 mmol/L 4.2 3.9 3.7  Chloride 98 - 111 mmol/L 106 103 87(L)  CO2 22 - 32 mmol/L 27 25 26   Calcium 8.9 - 10.3 mg/dL 8.2(L) 9.0 8.2(L)  Total Protein 6.5 - 8.1 g/dL - - 6.4(L)  Total Bilirubin 0.3 - 1.2 mg/dL - - 0.5  Alkaline Phos 38 - 126  U/L - - 67  AST 15 - 41 U/L - -  23  ALT 0 - 44 U/L - - 13   CBC Latest Ref Rng & Units 09/28/2018 09/27/2018 09/19/2018  WBC 4.0 - 10.5 K/uL 10.9(H) 12.0(H) 6.7  Hemoglobin 12.0 - 15.0 g/dL 9.7(L) 10.3(L) 12.4  Hematocrit 36.0 - 46.0 % 29.6(L) 31.5(L) 37.2  Platelets 150 - 400 K/uL 208 198 255   Lipid Panel  No results found for: CHOL, TRIG, HDL, CHOLHDL, VLDL, LDLCALC, LDLDIRECT HEMOGLOBIN A1C No results found for: HGBA1C, MPG TSH No results for input(s): TSH in the last 8760 hours.  PRN Meds:. There are no discontinued medications. Current Meds  Medication Sig  . acetaminophen (TYLENOL) 650 MG CR tablet Take 650 mg by mouth 3 (three) times daily.  Marland Kitchen amLODipine (NORVASC) 5 MG tablet Take 1 tablet (5 mg total) by mouth daily.  Marland Kitchen aspirin 81 MG tablet Take 81 mg by mouth daily.  . Carboxymethylcellulose Sodium (THERATEARS OP) Place 1 drop into the right eye daily as needed (dry eyes).  . cetirizine (ZYRTEC) 10 MG tablet Take 10 mg by mouth daily.  . cholecalciferol (VITAMIN D) 1000 UNITS tablet Take 1,000 Units by mouth daily.  . clindamycin (CLEOCIN) 150 MG capsule Take 600 mg by mouth See admin instructions. Take 600 mg 1 hour prior to dental work  . clopidogrel (PLAVIX) 75 MG tablet Take 1 tablet (75 mg total) by mouth daily. (Patient taking differently: Take 75 mg by mouth at bedtime. )  . diclofenac sodium (VOLTAREN) 1 % GEL Apply 1 application topically 4 (four) times daily as needed (arthritis pain).  . famotidine (PEPCID) 40 MG tablet Take 40 mg by mouth as needed.   . hydrALAZINE (APRESOLINE) 25 MG tablet Take 25 mg by mouth daily as needed (systolic bp goes over 998).   Marland Kitchen losartan (COZAAR) 100 MG tablet Take 100 mg by mouth daily.  . meclizine (ANTIVERT) 12.5 MG tablet Take 12.5 mg by mouth 3 (three) times daily as needed for dizziness.  . Menthol (ICY HOT) 5 % PTCH Apply 1 patch topically daily as needed (pain).  . nystatin (MYCOSTATIN/NYSTOP) powder Apply 15 g topically daily  as needed (rash).  . Pitavastatin Calcium (LIVALO) 2 MG TABS Take 1 mg by mouth every evening.  Marland Kitchen PRESCRIPTION MEDICATION Apply 1 application topically 3 (three) times daily as needed (back pain). Baclofen 5%, Diclofenac 3%, Gabapentin 6% mix 1g with 2g of lidocaine  . sertraline (ZOLOFT) 25 MG tablet Take 25 mg by mouth daily.  . traMADol (ULTRAM) 50 MG tablet Take 25 mg by mouth 3 (three) times daily.     Cardiac Studies:   Vasc US with ABI 09/29/2018:  Right: Resting right ankle-brachial index is within normal range. No evidence of significant right lower extremity arterial disease. Left: Resting left ankle-brachial index is within normal range. No evidence of significant left lower extremity arterial disease.  Peripheral arteriogram and angioplasty to CTO left SFA 09/27/2018: 1. Ultrasound guided right common femoral artery and left anterior tibial artery access 2. Abdominal aortogram 3. Crossover technique into left common iliac artery 4. Nonselective bilateral lower extremity distal runoff 5. Selective left lower extremity runoff 6. Successful PTCA and stenting Lt SFA with 3 overlapping stents     5.0 X 150 mm, 5.0 X 120 mm, 5.0 X 60 mm Innova self exapanding stents from distal to ostial Lt SFA.  7. Hemostasis 6/7 Fr Mynx closure right common femoral artery 8. Conscious sedation monitoring 228 min  Echocardiogram 10/21/2016: Left ventricle cavity is normal in size. Mild concentric  hypertrophy of the left ventricle. Normal global wall motion. Doppler evidence of grade II (pseudonormal) diastolic dysfunction. Diastolic dysfunction do not suggest elevated LA/LV endiastolic pressure. Calculated EF 55%. Left atrial cavity is mildly dilated by volume. Right atrial cavity is mildly dilated. Mild aortic valve leaflet calcification. Mildly restricted aortic valve leaflets. Trace aortic valve stenosis. Aortic valve peak pressure gradient of 17 and mean gradient of 7 mmHg, calculated aortic valve  area 1.97 cm. Mild (Grade I) aortic regurgitation. Mild to moderate mitral regurgitation. Mild to moderate tricuspid regurgitation. Moderate pulmonary hypertension. Pulmonary artery systolic pressure is estimated at 40 mm Hg. Insignificant pericardial effusion. IVC is dilated with poor inspiration collapse consistent with elevated right atrial pressure.  Lexiscan myoview stress test 10/19/2016: 1. The resting electrocardiogram demonstrated normal sinus rhythm, normal resting conduction, no resting arrhythmias and normal rest repolarization. Resting EKG shows NSR. Stress EKG is non-diagnostic for ischemia as it a pharmacologic stress using Lexiscan. Stress symptoms included dyspnea. There was rare PVC and PAC. 2. Myocardial perfusion imaging is normal. Overall left ventricular systolic function was normal without regional wall motion abnormalities. The left ventricular ejection fraction was 74%.  Carotid artery duplex 02/24/2018: Bilateral carotid arteries show mild heterogeneous plaque without stenosis. Antegrade right vertebral artery flow. Antegrade left vertebral artery flow. No significant change since 02/19/2016.   Assessment:     ICD-10-CM   1. PAD (peripheral artery disease) ([facility]) s/p angioplasty and repair and endarterectomy on 09/27/2018  I73.9   2. Lower extremity ulceration, left, with unspecified severity ([place])  L97.929   3. Essential hypertension  I10     EKG 07/27/2018: Normal sinus rhythm at 77 bpm, left atrial abnormality, normal axis, no evidence of ischemia.   Recommendations:   Patient underwent complex angioplasty to left SFA on 09/27/18 and noted to have dissection to left femoral at the end of the case. She urgently went for left femoral endarterectomy with bovine pericardial patch angioplasty with Dr. Scot Dock.  She continues to do well and is now at home. She has completed PT as of last week. She does still have some balance issues and weakness, but is able to walk  farther now. Daughter states that PT aggravated her back pain. They report that her left groin site is healing well and bruising has essentially resolved. She has only mild tenderness to the site. Daughter reports that left heel ulceration has almost completely healed. She does still have some skin peeling to the area. Wound care is still following. She will need to continue with DAPT for at least 6 months to 1 year.  Blood pressure has overall been stable. She did have one episode the other night of BP in the 536 systolic that improved with taking hydralazine, will continue the same. Daughters are contemplating putting her back in assisted living due to cost. I encouraged them, if able, to consider holding off for at least a few more weeks due to peak of COVID in [place] at this time. As patient is overall doing well, I will see her back in the office in 3 months or sooner if needed. Encouraged them to contact me for any discolorations or concerns.      Miquel Dunn, MSN, APRN, FNP-C [facility]. [place] Office: [phone] Fax: [phone]

## 2018-11-09 MED ORDER — HYDRALAZINE HCL 25 MG PO TABS: 25.0000 mg | ORAL_TABLET | Freq: Every day | ORAL | 2 refills | Status: DC | PRN

## 2018-11-09 NOTE — Telephone Encounter (Signed)
Please respond

## 2018-11-14 ENCOUNTER — Telehealth: Payer: Self-pay

## 2018-11-14 DIAGNOSIS — R42 Dizziness and giddiness: Secondary | ICD-10-CM

## 2018-11-14 NOTE — Telephone Encounter (Signed)
BMP order placed for tomorrow

## 2018-11-14 NOTE — Telephone Encounter (Signed)
Can you go ahead and put the bmp in

## 2018-11-14 NOTE — Telephone Encounter (Signed)
Pt daughter called she has been having dizzy spells in the morning and she thinks it can be her plavix shes only been on it a month and she wants to know if that can be a symptom. Please advise?  [phone]

## 2018-11-14 NOTE — Telephone Encounter (Signed)
Generally not a symptom of Plavix. Has her blood pressure been okay? We may need to order a BMP to check her sodium level, if I remember correctly, she had some dizziness last year that was found to be related to hyponatremia.

## 2018-11-17 DIAGNOSIS — R42 Dizziness and giddiness: Secondary | ICD-10-CM | POA: Diagnosis not present

## 2018-11-18 DIAGNOSIS — F4489 Other dissociative and conversion disorders: Secondary | ICD-10-CM | POA: Diagnosis not present

## 2018-11-18 DIAGNOSIS — R300 Dysuria: Secondary | ICD-10-CM | POA: Diagnosis not present

## 2018-11-18 LAB — BASIC METABOLIC PANEL
BUN/Creatinine Ratio: 13 (ref 12–28)
BUN: 14 mg/dL (ref 8–27)
CO2: 23 mmol/L (ref 20–29)
Calcium: 8.9 mg/dL (ref 8.7–10.3)
Chloride: 102 mmol/L (ref 96–106)
Creatinine, Ser: 1.07 mg/dL — ABNORMAL HIGH (ref 0.57–1.00)
GFR calc Af Amer: 54 mL/min/{1.73_m2} — ABNORMAL LOW (ref >59)
GFR calc non Af Amer: 47 mL/min/{1.73_m2} — ABNORMAL LOW (ref >59)
Glucose: 117 mg/dL — ABNORMAL HIGH (ref 65–99)
Potassium: 4.1 mmol/L (ref 3.5–5.2)
Sodium: 138 mmol/L (ref 134–144)

## 2018-11-21 ENCOUNTER — Telehealth: Payer: Self-pay | Admitting: Cardiology

## 2018-11-21 NOTE — Telephone Encounter (Signed)
LVM with daughter Loletha Carrow

## 2018-11-21 NOTE — Telephone Encounter (Signed)
Discussed lab results with patients daughter. Sodium level is normal and kidney function stable. During the interim, patient also developed confusion and lethargy. She was started on antibiotic for presumably a UTI by [facility]. She is now doing better. Advised them to notify me of any worsening.

## 2018-11-21 NOTE — Telephone Encounter (Signed)
From pt's daughter, I believe they called over the weekend  too.

## 2018-12-04 DIAGNOSIS — R110 Nausea: Secondary | ICD-10-CM | POA: Diagnosis not present

## 2018-12-04 DIAGNOSIS — R42 Dizziness and giddiness: Secondary | ICD-10-CM | POA: Diagnosis not present

## 2018-12-06 DIAGNOSIS — H524 Presbyopia: Secondary | ICD-10-CM | POA: Diagnosis not present

## 2018-12-06 DIAGNOSIS — H04123 Dry eye syndrome of bilateral lacrimal glands: Secondary | ICD-10-CM | POA: Diagnosis not present

## 2018-12-16 DIAGNOSIS — N183 Chronic kidney disease, stage 3 (moderate): Secondary | ICD-10-CM | POA: Diagnosis not present

## 2018-12-16 DIAGNOSIS — I129 Hypertensive chronic kidney disease with stage 1 through stage 4 chronic kidney disease, or unspecified chronic kidney disease: Secondary | ICD-10-CM | POA: Diagnosis not present

## 2018-12-16 DIAGNOSIS — E7800 Pure hypercholesterolemia, unspecified: Secondary | ICD-10-CM | POA: Diagnosis not present

## 2018-12-20 DIAGNOSIS — E7800 Pure hypercholesterolemia, unspecified: Secondary | ICD-10-CM | POA: Diagnosis not present

## 2018-12-20 DIAGNOSIS — F0280 Dementia in other diseases classified elsewhere without behavioral disturbance: Secondary | ICD-10-CM | POA: Diagnosis not present

## 2018-12-20 DIAGNOSIS — I129 Hypertensive chronic kidney disease with stage 1 through stage 4 chronic kidney disease, or unspecified chronic kidney disease: Secondary | ICD-10-CM | POA: Diagnosis not present

## 2018-12-20 DIAGNOSIS — N183 Chronic kidney disease, stage 3 (moderate): Secondary | ICD-10-CM | POA: Diagnosis not present

## 2018-12-20 DIAGNOSIS — G301 Alzheimer's disease with late onset: Secondary | ICD-10-CM | POA: Diagnosis not present

## 2018-12-20 DIAGNOSIS — F320 Major depressive disorder, single episode, mild: Secondary | ICD-10-CM | POA: Diagnosis not present

## 2019-01-03 DIAGNOSIS — L89529 Pressure ulcer of left ankle, unspecified stage: Secondary | ICD-10-CM | POA: Diagnosis not present

## 2019-01-03 DIAGNOSIS — L821 Other seborrheic keratosis: Secondary | ICD-10-CM | POA: Diagnosis not present

## 2019-01-04 DIAGNOSIS — F0280 Dementia in other diseases classified elsewhere without behavioral disturbance: Secondary | ICD-10-CM | POA: Diagnosis not present

## 2019-01-04 DIAGNOSIS — I129 Hypertensive chronic kidney disease with stage 1 through stage 4 chronic kidney disease, or unspecified chronic kidney disease: Secondary | ICD-10-CM | POA: Diagnosis not present

## 2019-01-04 DIAGNOSIS — F320 Major depressive disorder, single episode, mild: Secondary | ICD-10-CM | POA: Diagnosis not present

## 2019-01-04 DIAGNOSIS — G301 Alzheimer's disease with late onset: Secondary | ICD-10-CM | POA: Diagnosis not present

## 2019-01-04 DIAGNOSIS — N183 Chronic kidney disease, stage 3 (moderate): Secondary | ICD-10-CM | POA: Diagnosis not present

## 2019-01-12 DIAGNOSIS — Z03818 Encounter for observation for suspected exposure to other biological agents ruled out: Secondary | ICD-10-CM | POA: Diagnosis not present

## 2019-01-13 DIAGNOSIS — L89321 Pressure ulcer of left buttock, stage 1: Secondary | ICD-10-CM | POA: Diagnosis not present

## 2019-01-13 DIAGNOSIS — G301 Alzheimer's disease with late onset: Secondary | ICD-10-CM | POA: Diagnosis not present

## 2019-01-13 DIAGNOSIS — L89622 Pressure ulcer of left heel, stage 2: Secondary | ICD-10-CM | POA: Diagnosis not present

## 2019-01-13 DIAGNOSIS — Z86010 Personal history of colonic polyps: Secondary | ICD-10-CM | POA: Diagnosis not present

## 2019-01-13 DIAGNOSIS — I2510 Atherosclerotic heart disease of native coronary artery without angina pectoris: Secondary | ICD-10-CM | POA: Diagnosis not present

## 2019-01-13 DIAGNOSIS — I129 Hypertensive chronic kidney disease with stage 1 through stage 4 chronic kidney disease, or unspecified chronic kidney disease: Secondary | ICD-10-CM | POA: Diagnosis not present

## 2019-01-13 DIAGNOSIS — E785 Hyperlipidemia, unspecified: Secondary | ICD-10-CM | POA: Diagnosis not present

## 2019-01-13 DIAGNOSIS — N183 Chronic kidney disease, stage 3 (moderate): Secondary | ICD-10-CM | POA: Diagnosis not present

## 2019-01-13 DIAGNOSIS — F320 Major depressive disorder, single episode, mild: Secondary | ICD-10-CM | POA: Diagnosis not present

## 2019-01-13 DIAGNOSIS — I739 Peripheral vascular disease, unspecified: Secondary | ICD-10-CM | POA: Diagnosis not present

## 2019-01-13 DIAGNOSIS — F0280 Dementia in other diseases classified elsewhere without behavioral disturbance: Secondary | ICD-10-CM | POA: Diagnosis not present

## 2019-01-15 DIAGNOSIS — F0280 Dementia in other diseases classified elsewhere without behavioral disturbance: Secondary | ICD-10-CM | POA: Diagnosis not present

## 2019-01-15 DIAGNOSIS — L89321 Pressure ulcer of left buttock, stage 1: Secondary | ICD-10-CM | POA: Diagnosis not present

## 2019-01-15 DIAGNOSIS — L89622 Pressure ulcer of left heel, stage 2: Secondary | ICD-10-CM | POA: Diagnosis not present

## 2019-01-15 DIAGNOSIS — I129 Hypertensive chronic kidney disease with stage 1 through stage 4 chronic kidney disease, or unspecified chronic kidney disease: Secondary | ICD-10-CM | POA: Diagnosis not present

## 2019-01-15 DIAGNOSIS — N183 Chronic kidney disease, stage 3 (moderate): Secondary | ICD-10-CM | POA: Diagnosis not present

## 2019-01-15 DIAGNOSIS — G301 Alzheimer's disease with late onset: Secondary | ICD-10-CM | POA: Diagnosis not present

## 2019-01-17 ENCOUNTER — Emergency Department (HOSPITAL_COMMUNITY): Payer: Medicare Other

## 2019-01-17 ENCOUNTER — Inpatient Hospital Stay (HOSPITAL_COMMUNITY)
Admission: EM | Admit: 2019-01-17 | Discharge: 2019-01-21 | DRG: 070 | Disposition: A | Discharge status: Skilled Nursing Facility | Payer: Medicare Other | Attending: Neurology | Admitting: Neurology

## 2019-01-17 DIAGNOSIS — R51 Headache: Secondary | ICD-10-CM | POA: Diagnosis not present

## 2019-01-17 DIAGNOSIS — Z79899 Other long term (current) drug therapy: Secondary | ICD-10-CM

## 2019-01-17 DIAGNOSIS — G8929 Other chronic pain: Secondary | ICD-10-CM | POA: Diagnosis present

## 2019-01-17 DIAGNOSIS — M8580 Other specified disorders of bone density and structure, unspecified site: Secondary | ICD-10-CM | POA: Diagnosis present

## 2019-01-17 DIAGNOSIS — I2510 Atherosclerotic heart disease of native coronary artery without angina pectoris: Secondary | ICD-10-CM | POA: Diagnosis present

## 2019-01-17 DIAGNOSIS — Z96642 Presence of left artificial hip joint: Secondary | ICD-10-CM | POA: Diagnosis present

## 2019-01-17 DIAGNOSIS — Z7902 Long term (current) use of antithrombotics/antiplatelets: Secondary | ICD-10-CM | POA: Diagnosis not present

## 2019-01-17 DIAGNOSIS — G8191 Hemiplegia, unspecified affecting right dominant side: Secondary | ICD-10-CM | POA: Diagnosis present

## 2019-01-17 DIAGNOSIS — N390 Urinary tract infection, site not specified: Secondary | ICD-10-CM | POA: Diagnosis present

## 2019-01-17 DIAGNOSIS — N183 Chronic kidney disease, stage 3 unspecified: Secondary | ICD-10-CM | POA: Diagnosis not present

## 2019-01-17 DIAGNOSIS — Z96611 Presence of right artificial shoulder joint: Secondary | ICD-10-CM | POA: Diagnosis present

## 2019-01-17 DIAGNOSIS — M6281 Muscle weakness (generalized): Secondary | ICD-10-CM | POA: Diagnosis not present

## 2019-01-17 DIAGNOSIS — R29818 Other symptoms and signs involving the nervous system: Secondary | ICD-10-CM | POA: Diagnosis not present

## 2019-01-17 DIAGNOSIS — Z20828 Contact with and (suspected) exposure to other viral communicable diseases: Secondary | ICD-10-CM | POA: Diagnosis present

## 2019-01-17 DIAGNOSIS — Z87440 Personal history of urinary (tract) infections: Secondary | ICD-10-CM | POA: Diagnosis not present

## 2019-01-17 DIAGNOSIS — R2689 Other abnormalities of gait and mobility: Secondary | ICD-10-CM | POA: Diagnosis not present

## 2019-01-17 DIAGNOSIS — Z7401 Bed confinement status: Secondary | ICD-10-CM | POA: Diagnosis not present

## 2019-01-17 DIAGNOSIS — F0280 Dementia in other diseases classified elsewhere without behavioral disturbance: Secondary | ICD-10-CM | POA: Diagnosis not present

## 2019-01-17 DIAGNOSIS — I69351 Hemiplegia and hemiparesis following cerebral infarction affecting right dominant side: Secondary | ICD-10-CM | POA: Diagnosis not present

## 2019-01-17 DIAGNOSIS — M2550 Pain in unspecified joint: Secondary | ICD-10-CM | POA: Diagnosis not present

## 2019-01-17 DIAGNOSIS — B952 Enterococcus as the cause of diseases classified elsewhere: Secondary | ICD-10-CM | POA: Diagnosis not present

## 2019-01-17 DIAGNOSIS — Z888 Allergy status to other drugs, medicaments and biological substances status: Secondary | ICD-10-CM

## 2019-01-17 DIAGNOSIS — I6300 Cerebral infarction due to thrombosis of unspecified precerebral artery: Secondary | ICD-10-CM | POA: Diagnosis not present

## 2019-01-17 DIAGNOSIS — G9340 Encephalopathy, unspecified: Principal | ICD-10-CM | POA: Diagnosis present

## 2019-01-17 DIAGNOSIS — R404 Transient alteration of awareness: Secondary | ICD-10-CM | POA: Diagnosis not present

## 2019-01-17 DIAGNOSIS — Z03818 Encounter for observation for suspected exposure to other biological agents ruled out: Secondary | ICD-10-CM | POA: Diagnosis not present

## 2019-01-17 DIAGNOSIS — I6523 Occlusion and stenosis of bilateral carotid arteries: Secondary | ICD-10-CM | POA: Diagnosis not present

## 2019-01-17 DIAGNOSIS — Z880 Allergy status to penicillin: Secondary | ICD-10-CM

## 2019-01-17 DIAGNOSIS — M1990 Unspecified osteoarthritis, unspecified site: Secondary | ICD-10-CM | POA: Diagnosis present

## 2019-01-17 DIAGNOSIS — K859 Acute pancreatitis without necrosis or infection, unspecified: Secondary | ICD-10-CM | POA: Diagnosis present

## 2019-01-17 DIAGNOSIS — I361 Nonrheumatic tricuspid (valve) insufficiency: Secondary | ICD-10-CM | POA: Diagnosis not present

## 2019-01-17 DIAGNOSIS — E785 Hyperlipidemia, unspecified: Secondary | ICD-10-CM | POA: Diagnosis not present

## 2019-01-17 DIAGNOSIS — Z9049 Acquired absence of other specified parts of digestive tract: Secondary | ICD-10-CM

## 2019-01-17 DIAGNOSIS — Z7982 Long term (current) use of aspirin: Secondary | ICD-10-CM | POA: Diagnosis not present

## 2019-01-17 DIAGNOSIS — I639 Cerebral infarction, unspecified: Secondary | ICD-10-CM | POA: Diagnosis not present

## 2019-01-17 DIAGNOSIS — I10 Essential (primary) hypertension: Secondary | ICD-10-CM | POA: Diagnosis not present

## 2019-01-17 DIAGNOSIS — F329 Major depressive disorder, single episode, unspecified: Secondary | ICD-10-CM | POA: Diagnosis not present

## 2019-01-17 DIAGNOSIS — E876 Hypokalemia: Secondary | ICD-10-CM | POA: Diagnosis present

## 2019-01-17 DIAGNOSIS — L89321 Pressure ulcer of left buttock, stage 1: Secondary | ICD-10-CM | POA: Diagnosis not present

## 2019-01-17 DIAGNOSIS — R4182 Altered mental status, unspecified: Secondary | ICD-10-CM | POA: Diagnosis not present

## 2019-01-17 DIAGNOSIS — I129 Hypertensive chronic kidney disease with stage 1 through stage 4 chronic kidney disease, or unspecified chronic kidney disease: Secondary | ICD-10-CM | POA: Diagnosis not present

## 2019-01-17 DIAGNOSIS — R410 Disorientation, unspecified: Secondary | ICD-10-CM | POA: Diagnosis not present

## 2019-01-17 DIAGNOSIS — Z66 Do not resuscitate: Secondary | ICD-10-CM | POA: Diagnosis present

## 2019-01-17 DIAGNOSIS — I161 Hypertensive emergency: Secondary | ICD-10-CM | POA: Diagnosis present

## 2019-01-17 DIAGNOSIS — Z90710 Acquired absence of both cervix and uterus: Secondary | ICD-10-CM

## 2019-01-17 DIAGNOSIS — G301 Alzheimer's disease with late onset: Secondary | ICD-10-CM | POA: Diagnosis not present

## 2019-01-17 DIAGNOSIS — I739 Peripheral vascular disease, unspecified: Secondary | ICD-10-CM | POA: Diagnosis present

## 2019-01-17 DIAGNOSIS — I351 Nonrheumatic aortic (valve) insufficiency: Secondary | ICD-10-CM | POA: Diagnosis not present

## 2019-01-17 DIAGNOSIS — R2681 Unsteadiness on feet: Secondary | ICD-10-CM | POA: Diagnosis not present

## 2019-01-17 DIAGNOSIS — Z96651 Presence of right artificial knee joint: Secondary | ICD-10-CM | POA: Diagnosis present

## 2019-01-17 DIAGNOSIS — R5381 Other malaise: Secondary | ICD-10-CM | POA: Diagnosis not present

## 2019-01-17 DIAGNOSIS — E7800 Pure hypercholesterolemia, unspecified: Secondary | ICD-10-CM | POA: Diagnosis present

## 2019-01-17 DIAGNOSIS — L89622 Pressure ulcer of left heel, stage 2: Secondary | ICD-10-CM | POA: Diagnosis not present

## 2019-01-17 DIAGNOSIS — Z882 Allergy status to sulfonamides status: Secondary | ICD-10-CM

## 2019-01-17 DIAGNOSIS — R41841 Cognitive communication deficit: Secondary | ICD-10-CM | POA: Diagnosis not present

## 2019-01-17 DIAGNOSIS — R1312 Dysphagia, oropharyngeal phase: Secondary | ICD-10-CM | POA: Diagnosis not present

## 2019-01-17 DIAGNOSIS — Z959 Presence of cardiac and vascular implant and graft, unspecified: Secondary | ICD-10-CM | POA: Diagnosis not present

## 2019-01-17 DIAGNOSIS — H9190 Unspecified hearing loss, unspecified ear: Secondary | ICD-10-CM | POA: Diagnosis present

## 2019-01-17 DIAGNOSIS — R278 Other lack of coordination: Secondary | ICD-10-CM | POA: Diagnosis not present

## 2019-01-17 DIAGNOSIS — Z8249 Family history of ischemic heart disease and other diseases of the circulatory system: Secondary | ICD-10-CM

## 2019-01-17 DIAGNOSIS — I480 Paroxysmal atrial fibrillation: Secondary | ICD-10-CM | POA: Diagnosis present

## 2019-01-17 DIAGNOSIS — Z8041 Family history of malignant neoplasm of ovary: Secondary | ICD-10-CM

## 2019-01-17 LAB — CBC
HCT: 38.4 % (ref 36.0–46.0)
Hemoglobin: 12 g/dL (ref 12.0–15.0)
MCH: 30.9 pg (ref 26.0–34.0)
MCHC: 31.3 g/dL (ref 30.0–36.0)
MCV: 99 fL (ref 80.0–100.0)
Platelets: 217 10*3/uL (ref 150–400)
RBC: 3.88 MIL/uL (ref 3.87–5.11)
RDW: 12.3 % (ref 11.5–15.5)
WBC: 7.2 10*3/uL (ref 4.0–10.5)
nRBC: 0 % (ref 0.0–0.2)

## 2019-01-17 LAB — COMPREHENSIVE METABOLIC PANEL
ALT: 12 U/L (ref 0–44)
AST: 19 U/L (ref 15–41)
Albumin: 3.6 g/dL (ref 3.5–5.0)
Alkaline Phosphatase: 73 U/L (ref 38–126)
Anion gap: 10 (ref 5–15)
BUN: 11 mg/dL (ref 8–23)
CO2: 26 mmol/L (ref 22–32)
Calcium: 9.2 mg/dL (ref 8.9–10.3)
Chloride: 104 mmol/L (ref 98–111)
Creatinine, Ser: 0.83 mg/dL (ref 0.44–1.00)
GFR calc Af Amer: >60 mL/min (ref >60)
GFR calc non Af Amer: >60 mL/min (ref >60)
Glucose, Bld: 100 mg/dL — ABNORMAL HIGH (ref 70–99)
Potassium: 3.4 mmol/L — ABNORMAL LOW (ref 3.5–5.1)
Sodium: 140 mmol/L (ref 135–145)
Total Bilirubin: 0.8 mg/dL (ref 0.3–1.2)
Total Protein: 6 g/dL — ABNORMAL LOW (ref 6.5–8.1)

## 2019-01-17 LAB — PROTIME-INR
INR: 1.1 (ref 0.8–1.2)
Prothrombin Time: 13.6 seconds (ref 11.4–15.2)

## 2019-01-17 LAB — DIFFERENTIAL
Abs Immature Granulocytes: 0.02 10*3/uL (ref 0.00–0.07)
Basophils Absolute: 0 10*3/uL (ref 0.0–0.1)
Basophils Relative: 0 %
Eosinophils Absolute: 0.1 10*3/uL (ref 0.0–0.5)
Eosinophils Relative: 2 %
Immature Granulocytes: 0 %
Lymphocytes Relative: 29 %
Lymphs Abs: 2.1 10*3/uL (ref 0.7–4.0)
Monocytes Absolute: 0.7 10*3/uL (ref 0.1–1.0)
Monocytes Relative: 9 %
Neutro Abs: 4.3 10*3/uL (ref 1.7–7.7)
Neutrophils Relative %: 60 %

## 2019-01-17 LAB — I-STAT CHEM 8, ED
BUN: 13 mg/dL (ref 8–23)
Calcium, Ion: 1.19 mmol/L (ref 1.15–1.40)
Chloride: 102 mmol/L (ref 98–111)
Creatinine, Ser: 0.8 mg/dL (ref 0.44–1.00)
Glucose, Bld: 93 mg/dL (ref 70–99)
HCT: 38 % (ref 36.0–46.0)
Hemoglobin: 12.9 g/dL (ref 12.0–15.0)
Potassium: 3.2 mmol/L — ABNORMAL LOW (ref 3.5–5.1)
Sodium: 140 mmol/L (ref 135–145)
TCO2: 26 mmol/L (ref 22–32)

## 2019-01-17 LAB — APTT: aPTT: 25 seconds (ref 24–36)

## 2019-01-17 LAB — CBG MONITORING, ED: Glucose-Capillary: 90 mg/dL (ref 70–99)

## 2019-01-17 LAB — MRSA PCR SCREENING: MRSA by PCR: NEGATIVE

## 2019-01-17 LAB — SARS CORONAVIRUS 2 BY RT PCR (HOSPITAL ORDER, PERFORMED IN ~~LOC~~ HOSPITAL LAB): SARS Coronavirus 2: NEGATIVE

## 2019-01-17 IMAGING — CT CT HEAD CODE STROKE
3 series · 14 of 47 positions shown, 16 images · non-contrast
Comparison: Head CT [DATE], brain MRI [DATE]

CLINICAL DATA: Code stroke. Focal neuro deficit, less than 6 hours,
stroke suspected. Additional history: Difficulty speaking, last
known well [DATE]

EXAM:
CT HEAD WITHOUT CONTRAST
TECHNIQUE: Contiguous axial images were obtained from the base of the skull
through the vertex without intravenous contrast.

[Series 3: head 5.0 h30s · axial · 0.46mm/px · z∈[-126,+4]mm · 8 of 32 slices shown, 10 images]
[im 3/32  brain]
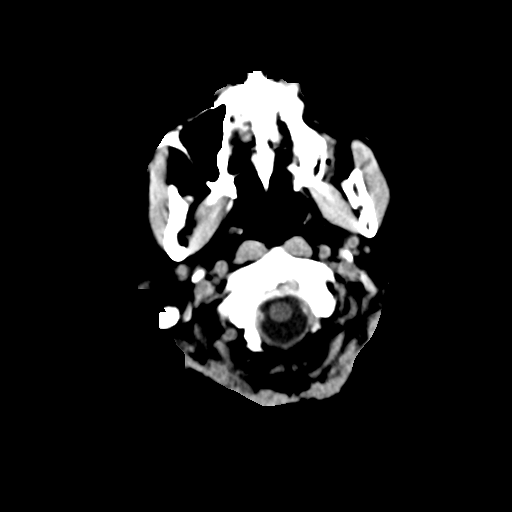
[im 3/32  bone]
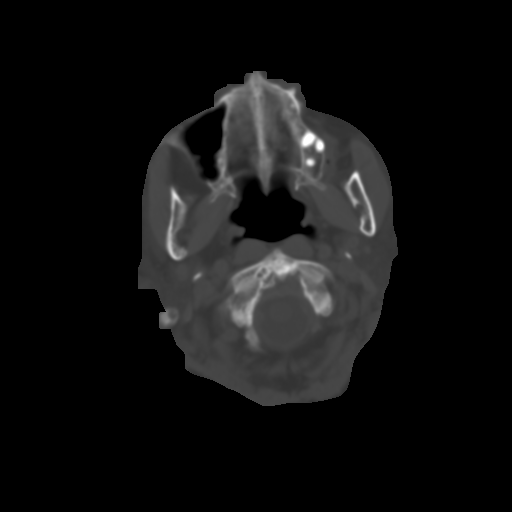
[im 7/32  brain]
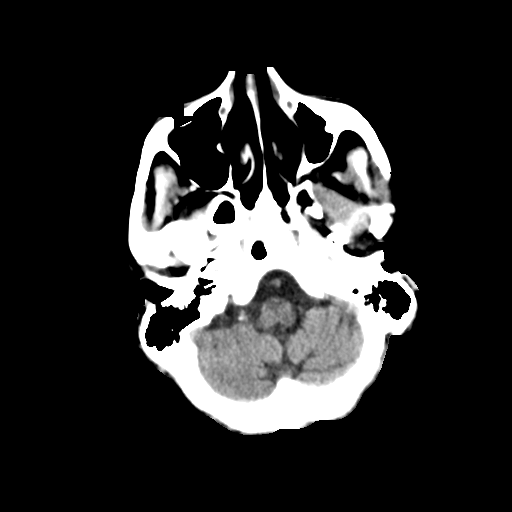
[im 10/32  brain]
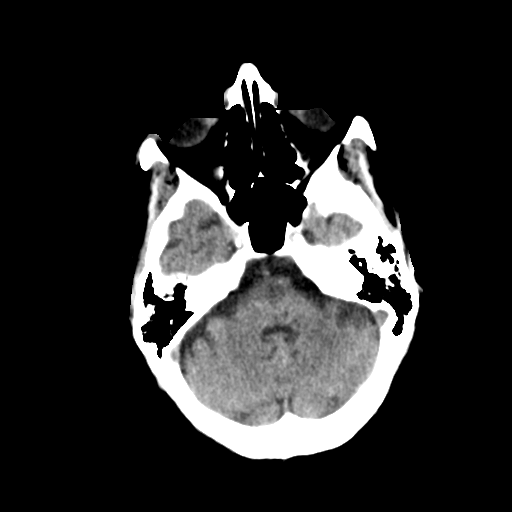
[im 14/32  brain]
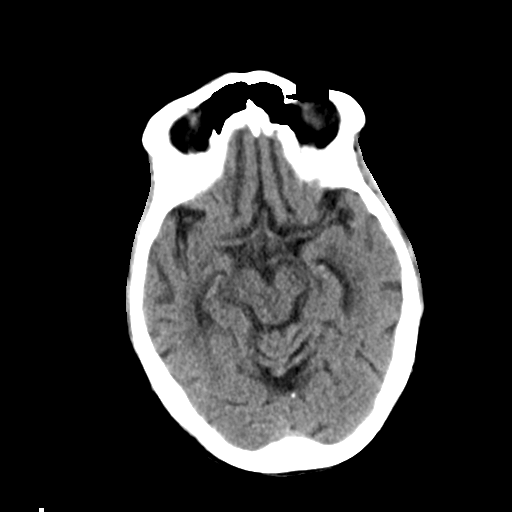
[im 18/32  brain]
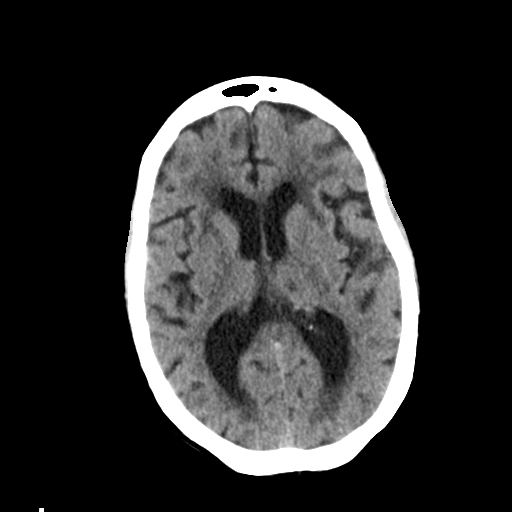
[im 18/32  bone]
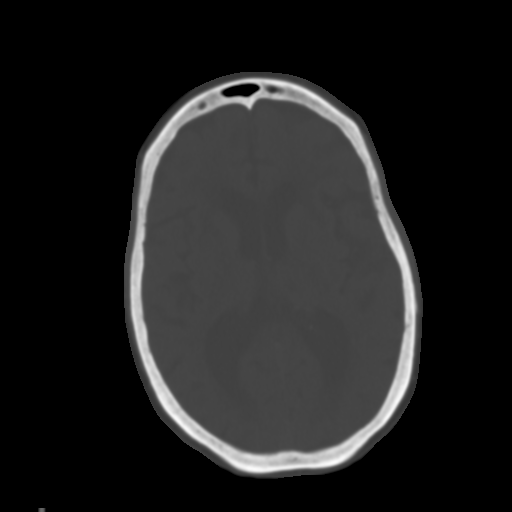
[im 22/32  brain]
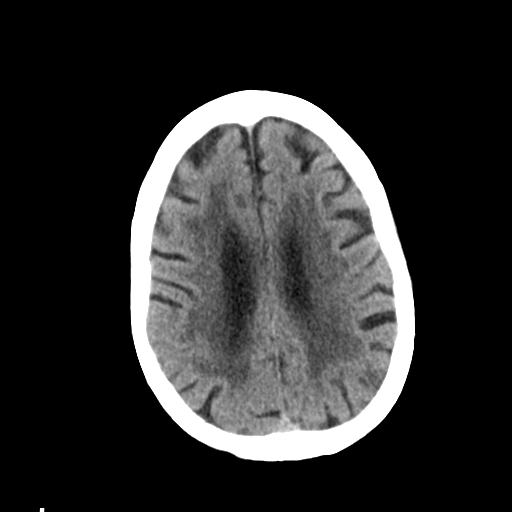
[im 25/32  brain]
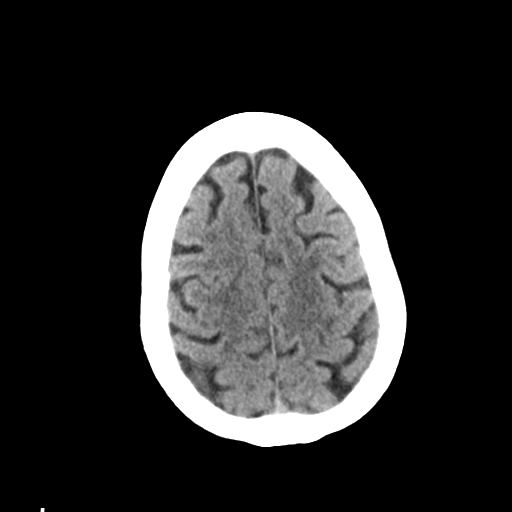
[im 29/32  brain]
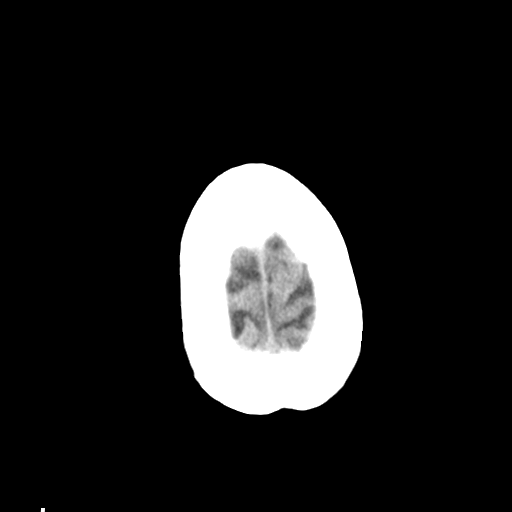

[Series 5: head 3.0 mpr cor · coronal · 0.31mm/px · 3 of 67 slices shown]
[im 23/67  brain]
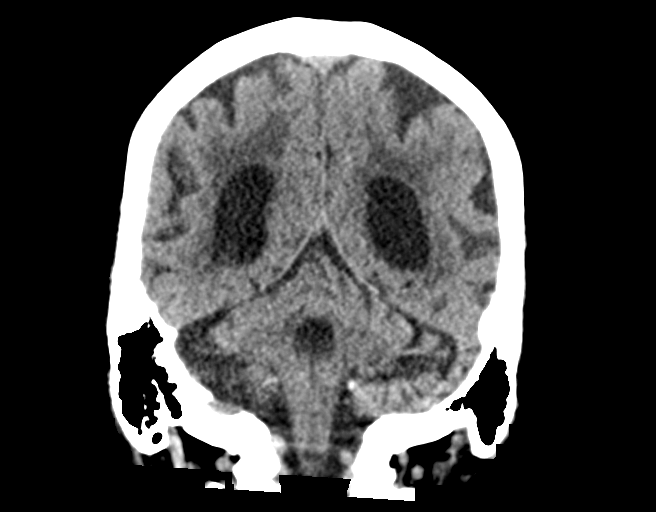
[im 30/67  brain]
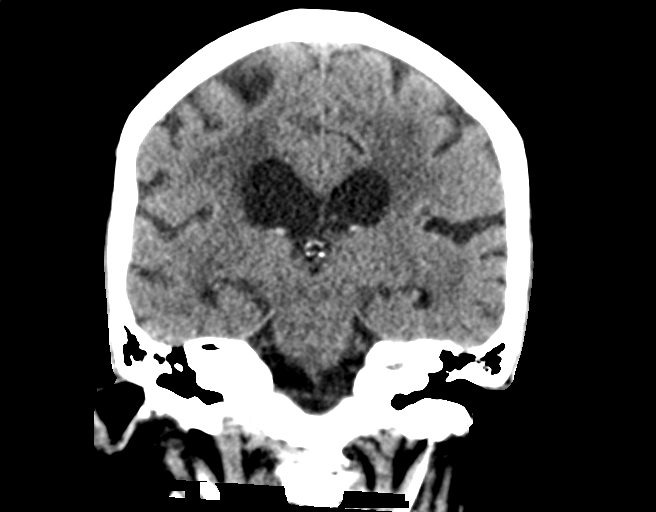
[im 37/67  brain]
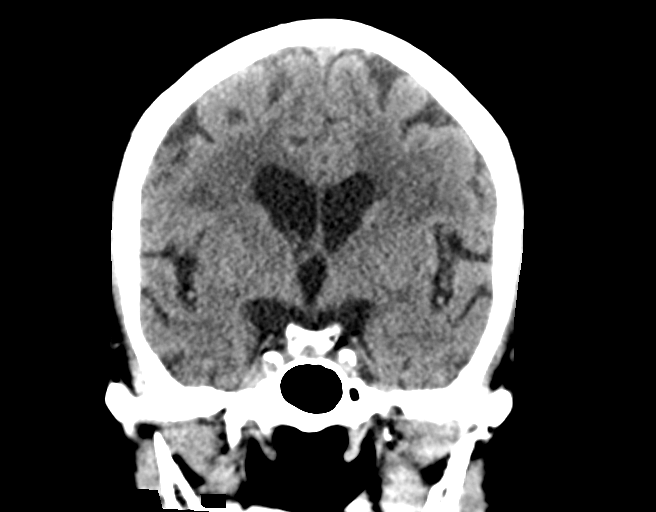

[Series 6: head 3.0 mpr sag · sagittal · 0.31mm/px · 3 of 56 slices shown]
[im 19/56  brain]
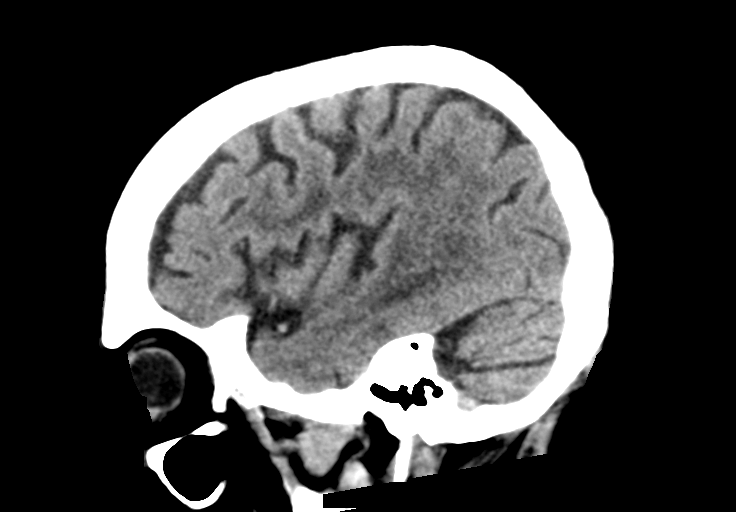
[im 28/56  brain]
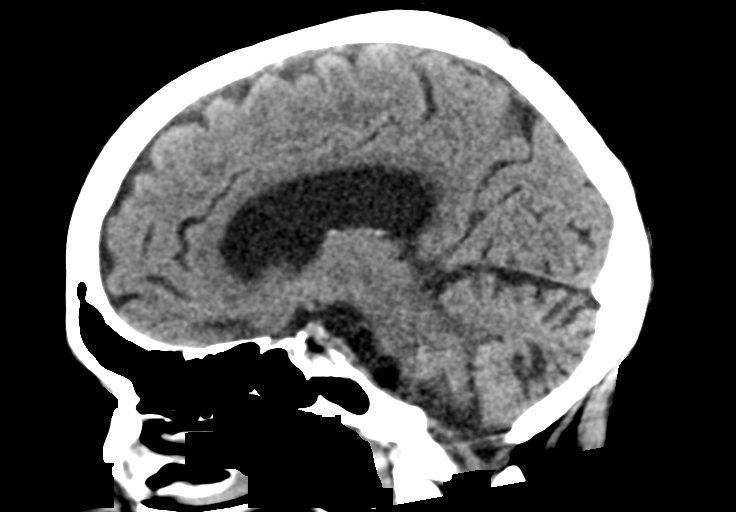
[im 37/56  brain]
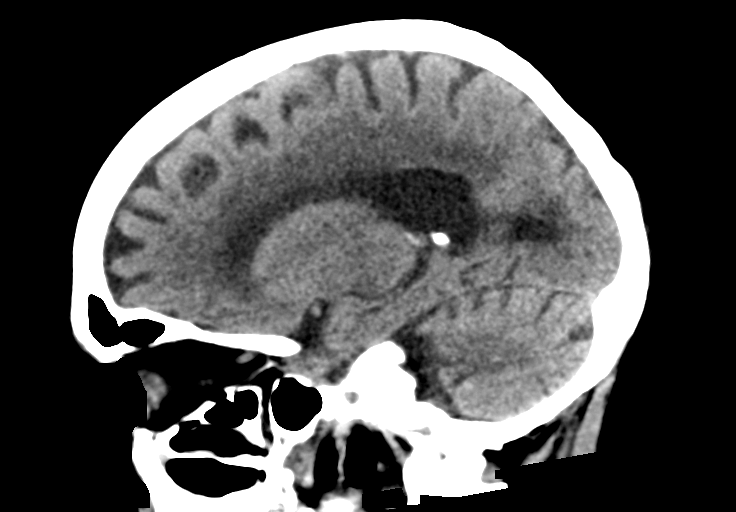

[14 of 47 positions shown; findings below may reference images not displayed]

FINDINGS: Brain: There is no acute intracranial hemorrhage. No evidence of
acute demarcated cortical infarction. Again demonstrated is advanced
chronic small vessel ischemic disease. Redemonstrated chronic
lacunar infarct within the left thalamus. No evidence of
intracranial mass. No midline shift or extra-axial fluid collection.
Moderate generalized parenchymal atrophy.

Vascular: No hyperdense vessel. Atherosclerotic calcification of the
carotid artery siphons.

Skull: No calvarial fracture.

Sinuses/Orbits: No significant paranasal sinus disease. Chronic
deformity of the left maxillary sinus. No significant mastoid
effusion.

Other: Bilateral lens replacements. The imaged orbits demonstrate no
acute abnormality.

ASPECTS (Alberta Stroke Program Early CT Score)

- Ganglionic level infarction (caudate, lentiform nuclei, internal
capsule, insula, M1-M3 cortex): 7

- Supraganglionic infarction (M4-M6 cortex): 3

Total score (0-10 with 10 being normal): 10

These results were communicated to SIVAKUMAR at [DATE] pmon
[DATE]by text page via the [employer] messaging system.
IMPRESSION: No acute intracranial hemorrhage or evidence of acute demarcated
cortical infarction. ASPECTS 10

Redemonstrated advanced chronic small vessel ischemic disease with
chronic left thalamic lacunar infarct.

Moderate generalized parenchymal atrophy.

## 2019-01-17 IMAGING — CT CT ANGIO NECK
1 of 7 series · 12 of 46 positions shown, 16 images · IV contrast (OMNI)
Comparison: Noncontrast head CT performed earlier the same day.

CLINICAL DATA: Focal neuro deficit, stroke suspected.

EXAM:
CT ANGIOGRAPHY HEAD AND NECK
TECHNIQUE: Multidetector CT imaging of the head and neck was performed using
the standard protocol during bolus administration of intravenous
contrast. Multiplanar CT image reconstructions and MIPs were
obtained to evaluate the vascular anatomy. Carotid stenosis
measurements (when applicable) are obtained utilizing NASCET
criteria, using the distal internal carotid diameter as the
denominator.
CONTRAST:  75mL OMNIPAQUE IOHEXOL 350 MG/ML SOLN

[Series 12: thin · axial · 0.48mm/px · z∈[-332,-28]mm · 12 of 699 slices shown, 16 images]
[im 61/699  soft-tissue]
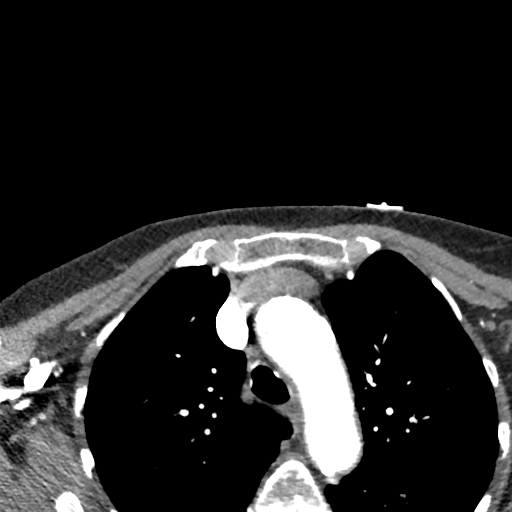
[im 61/699  bone]
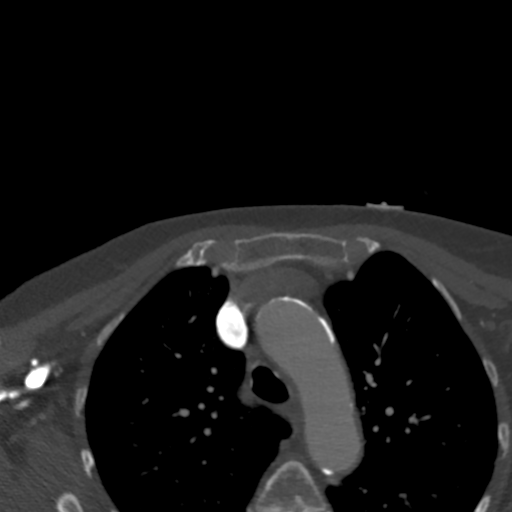
[im 122/699  soft-tissue]
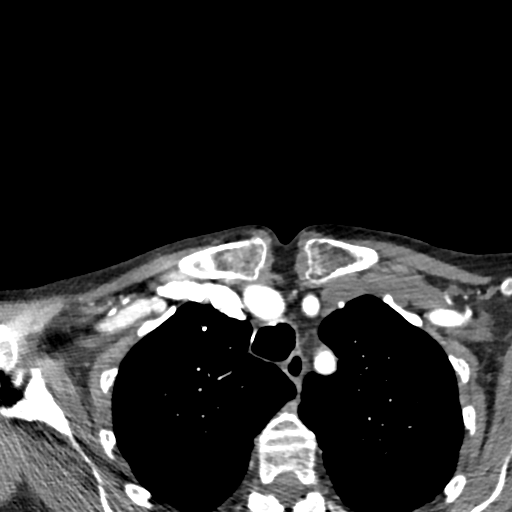
[im 183/699  soft-tissue]
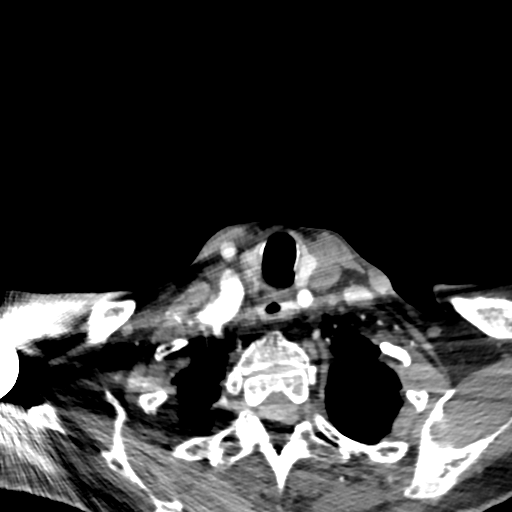
[im 243/699  soft-tissue]
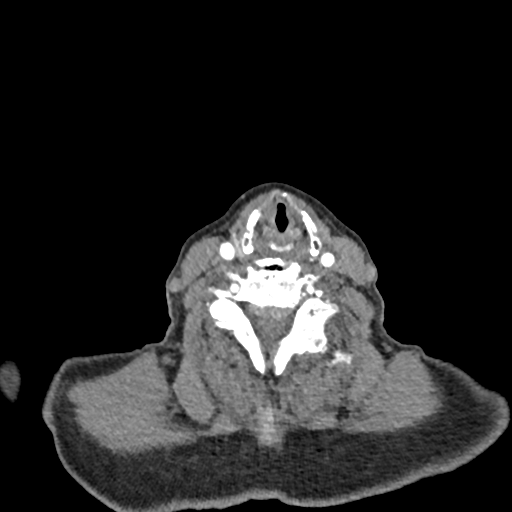
[im 304/699  soft-tissue]
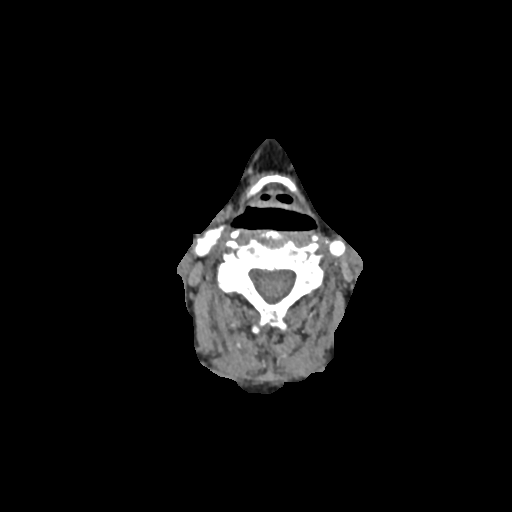
[im 395/699  soft-tissue]
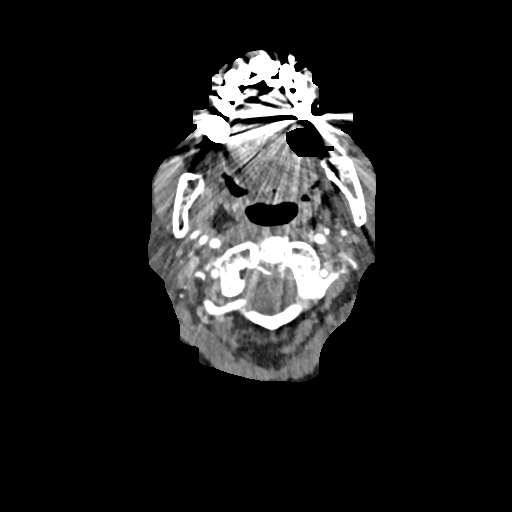
[im 456/699  soft-tissue]
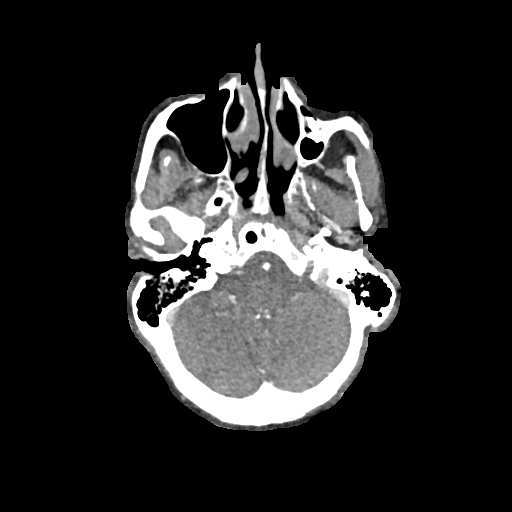
[im 516/699  soft-tissue]
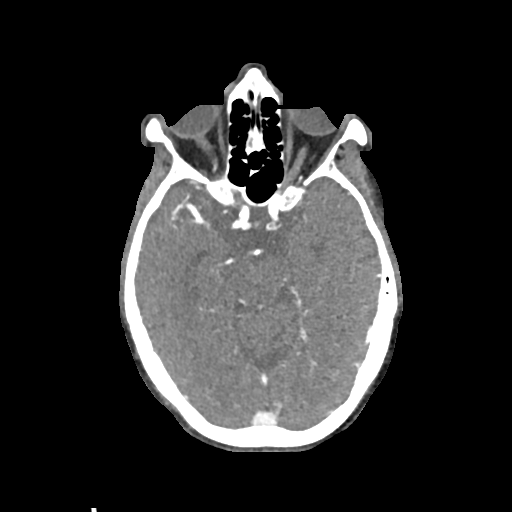
[im 577/699  soft-tissue]
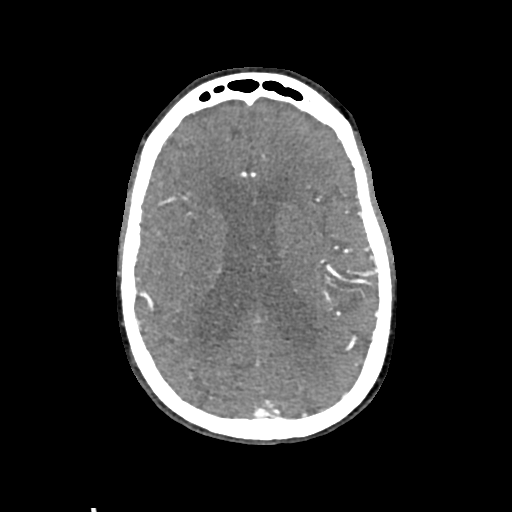
[im 577/699  lung]
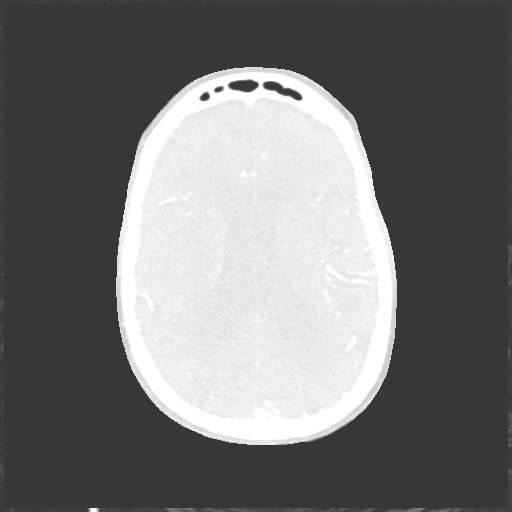
[im 577/699  bone]
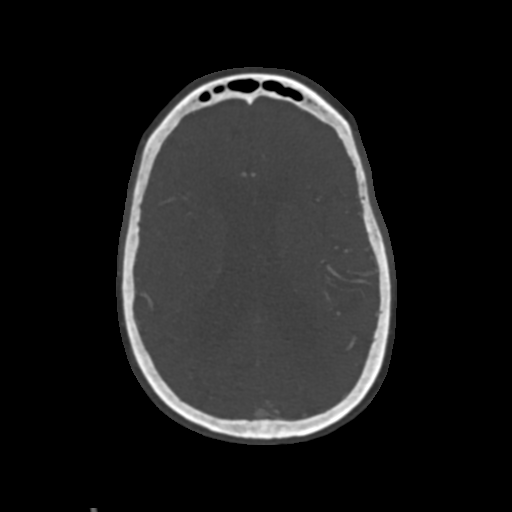
[im 607/699  lung]
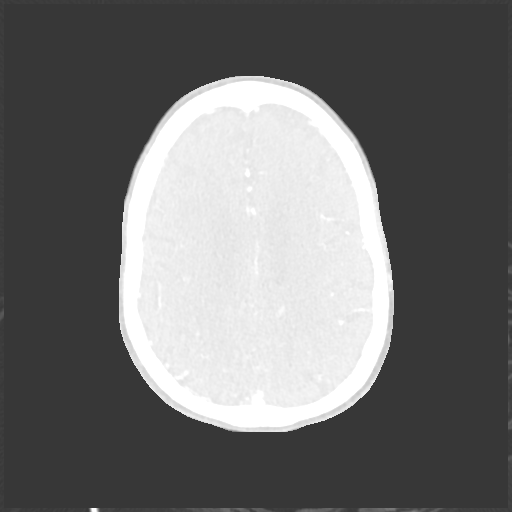
[im 638/699  soft-tissue]
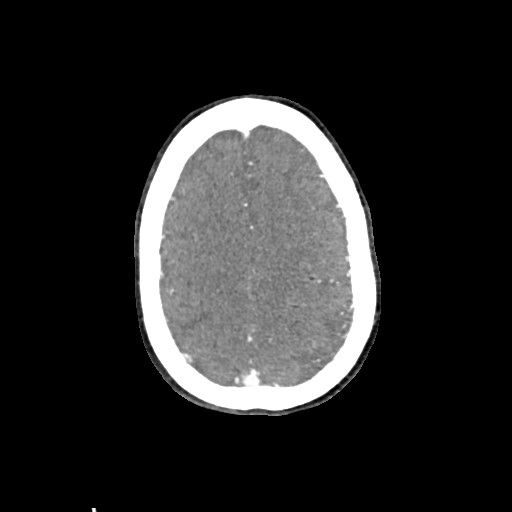
[im 638/699  lung]
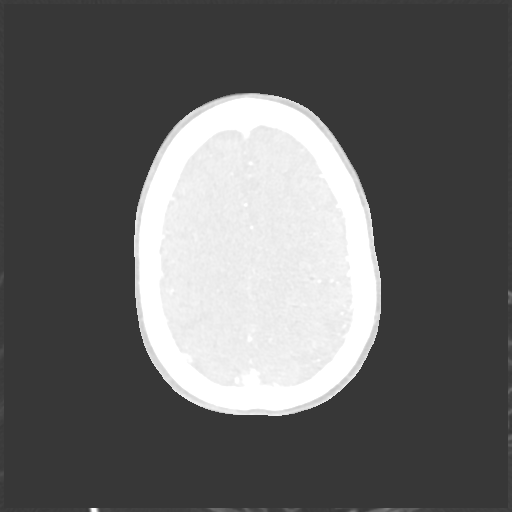
[im 668/699  lung]
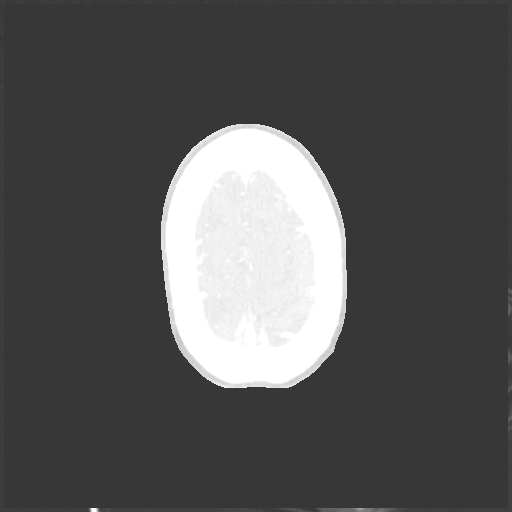

[12 of 46 positions shown; findings below may reference images not displayed]

FINDINGS: CTA NECK FINDINGS

Aortic arch: Standard branching. Atherosclerotic calcification of
the visualized aortic arch and major branch vessels.

Right carotid system: Scattered atherosclerotic plaque within the
common carotid artery without significant stenosis. Calcified and
noncalcified atherosclerotic plaque at the carotid bifurcation with
less than 50% luminal narrowing of the proximal cervical right ICA.
Distal to this, the ICA is widely patent within the neck

Left carotid system: Scattered atherosclerotic plaque within the
common carotid artery without significant stenosis. Calcified and
noncalcified atherosclerotic plaque at the left carotid bifurcation
with less than 50% narrowing of the proximal cervical ICA. Distal to
this, the ICA is widely patent within the neck.

Vertebral arteries: Calcified plaque at the origin of the right
vertebral artery. However, right vertebral artery is patent
throughout the neck without significant stenosis (50% or greater).
Prominent calcified plaque at the origin of the left vertebral
artery with suspected at least moderate ostial narrowing. Distal to
this, the left vertebral artery is patent within the neck without
significant stenosis (50% or greater).

Skeleton: Cervical spondylosis with multilevel bony neural foraminal
narrowing. No high-grade bony spinal canal stenosis.

Other neck: No soft tissue neck mass or pathologically enlarged
cervical chain lymph nodes.

Upper chest: No confluent consolidation within the imaged lung
apices. Partially visualized right shoulder prosthesis.

Review of the MIP images confirms the above findings

CTA HEAD FINDINGS

Anterior circulation: Calcified plaque in the right internal carotid
artery siphon with regions of mild luminal narrowing. Calcified
plaque within the left internal carotid artery siphon. Apparent
mild-to-moderate narrowing of the paraclinoid left internal carotid
artery. Additional regions of mild luminal narrowing within the left
carotid siphon. The right middle and anterior cerebral arteries are
patent without significant proximal stenosis. The left middle and
anterior cerebral arteries are patent without significant proximal
stenosis. No intracranial aneurysm is identified.

Posterior circulation: The intracranial vertebral arteries are
patent without significant stenosis. Atherosclerotic irregularity of
the basilar artery with a mild-to-moderate focal stenosis within the
mid basilar artery (series 12, image 496) (series 9, image 95). The
bilateral posterior cerebral arteries are patent without significant
proximal stenosis. Predominantly fetal origin of the right posterior
cerebral artery.

Venous sinuses: Within the limitations of contrast timing, no
evidence of thrombosis.

Anatomic variants: None significant

Review of the MIP images confirms the above findings

These results were called by telephone at the time of interpretation
on [DATE] at [DATE] to Dr. LEONELA , who verbally
acknowledged these results.
IMPRESSION: CTA head:

No intracranial large vessel occlusion.

Calcified plaque within the intracranial carotid artery siphons.
Resultant mild-to-moderate narrowing of the paraclinoid left
internal carotid artery. Additional sites of mild luminal narrowing
within the intracranial carotid artery siphons bilaterally.

Atherosclerotic irregularity of the basilar artery with a focal mild
to moderate stenosis within the mid basilar artery.

CTA neck:

Atherosclerotic disease within the visualized aortic arch and major
branch vessels as described.

The bilateral common carotid, internal carotid and vertebral
arteries are patent within the neck. Plaque at the carotid
bifurcations results in less than 50% narrowing of the proximal
cervical ICAs bilaterally. Prominent calcified plaque at the origin
of the left vertebral artery with suspected at least moderate ostial
narrowing.

## 2019-01-17 IMAGING — CT CT ANGIO HEAD
1 of 7 series · 16 of 47 positions shown · IV contrast (OMNI)
Comparison: Noncontrast head CT performed earlier the same day.

CLINICAL DATA: Focal neuro deficit, stroke suspected.

EXAM:
CT ANGIOGRAPHY HEAD AND NECK
TECHNIQUE: Multidetector CT imaging of the head and neck was performed using
the standard protocol during bolus administration of intravenous
contrast. Multiplanar CT image reconstructions and MIPs were
obtained to evaluate the vascular anatomy. Carotid stenosis
measurements (when applicable) are obtained utilizing NASCET
criteria, using the distal internal carotid diameter as the
denominator.
CONTRAST:  75mL OMNIPAQUE IOHEXOL 350 MG/ML SOLN

[Series 12: thin · axial · 0.48mm/px · z∈[-345,-30]mm · 16 of 699 slices shown]
[im 34/699  brain]
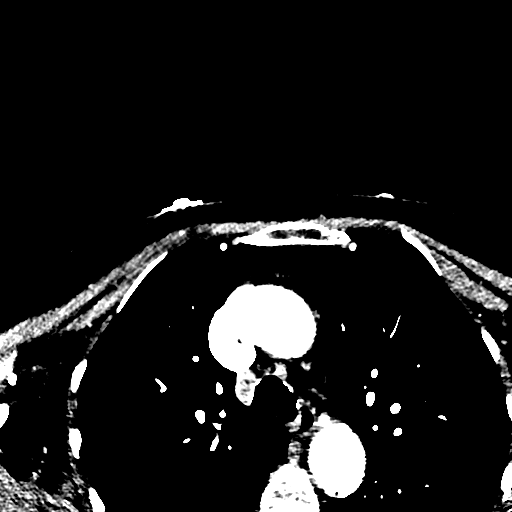
[im 67/699  bone]
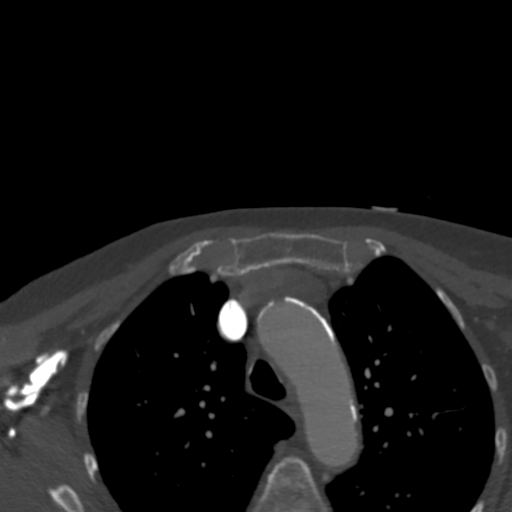
[im 133/699  brain]
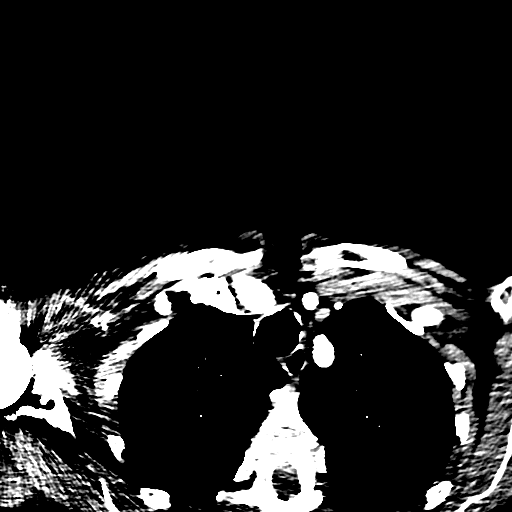
[im 167/699  bone]
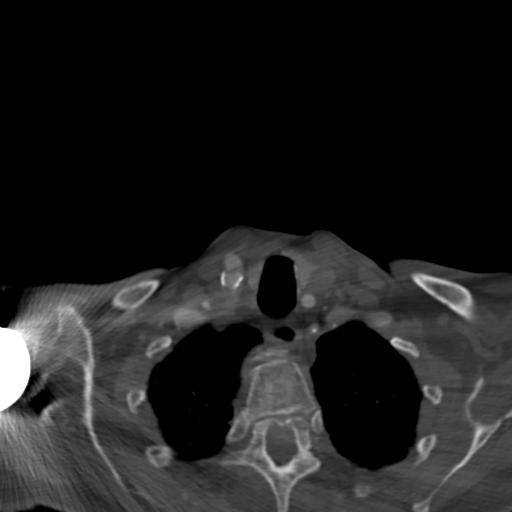
[im 200/699  brain]
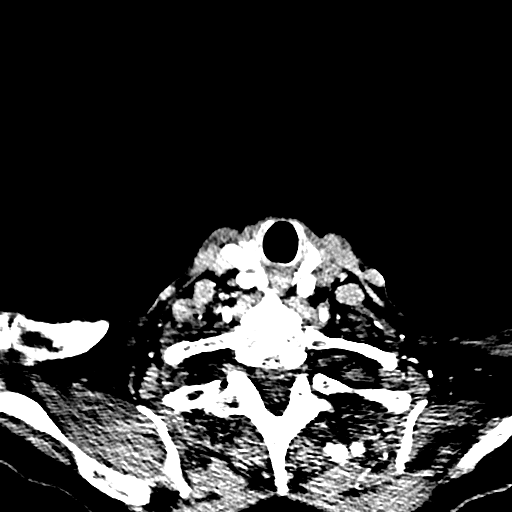
[im 233/699  bone]
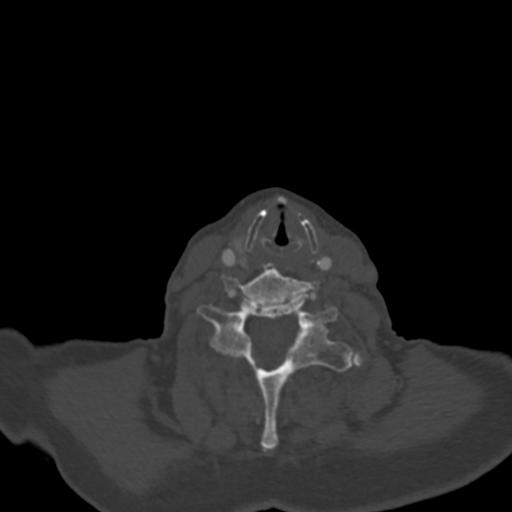
[im 300/699  brain]
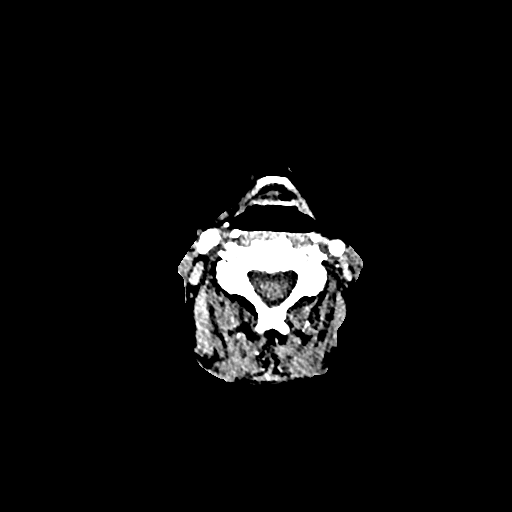
[im 333/699  bone]
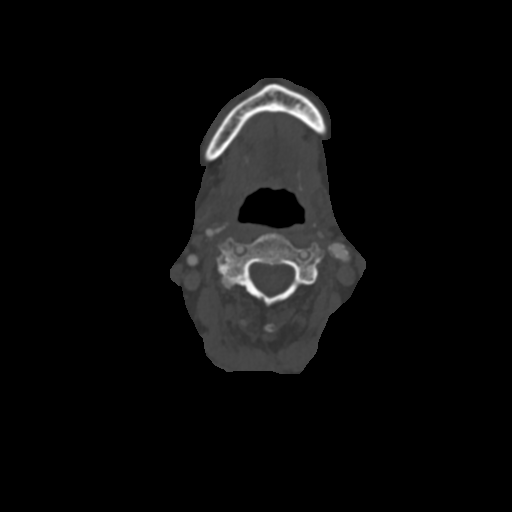
[im 366/699  brain]
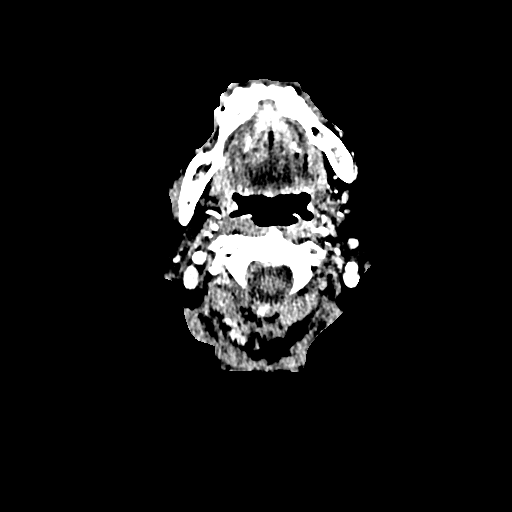
[im 399/699  bone]
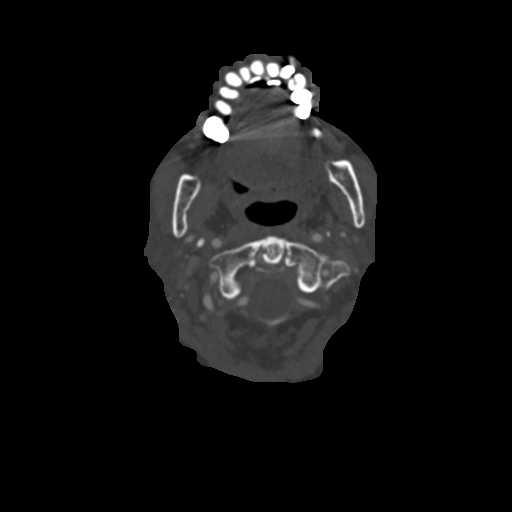
[im 466/699  brain]
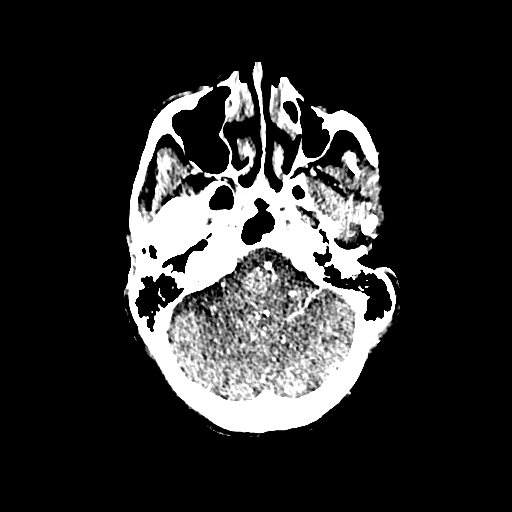
[im 499/699  bone]
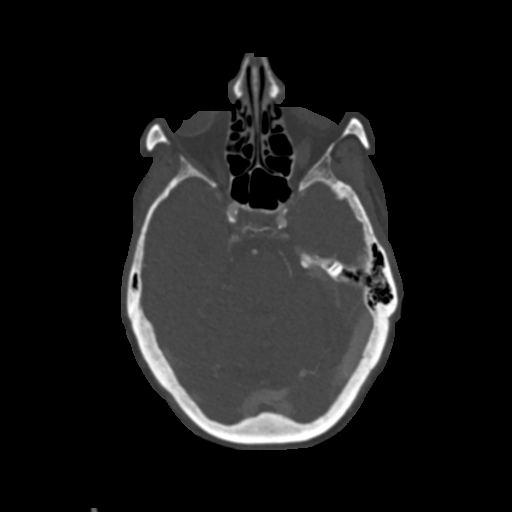
[im 532/699  brain]
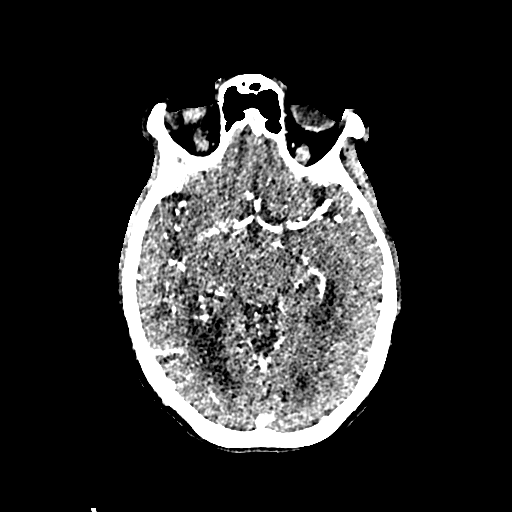
[im 566/699  bone]
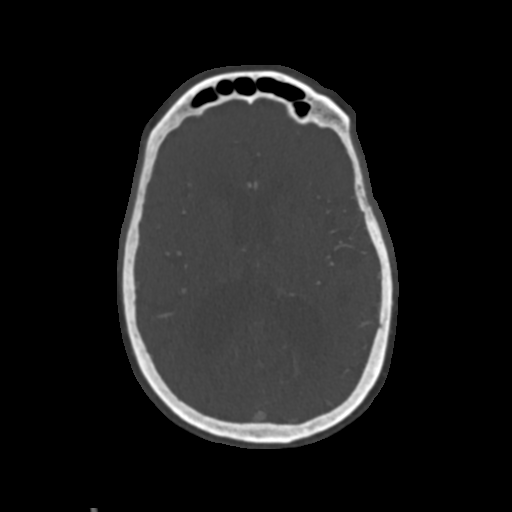
[im 632/699  brain]
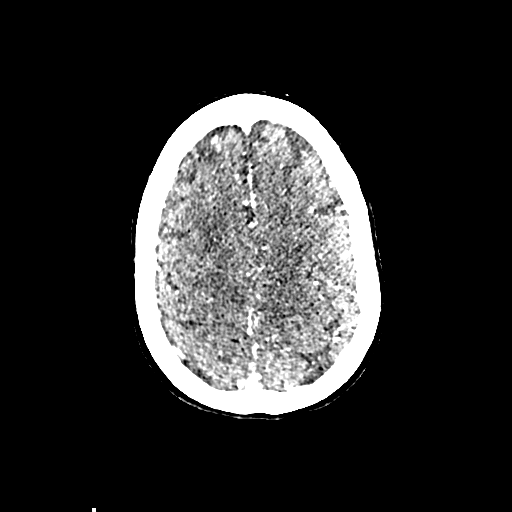
[im 665/699  bone]
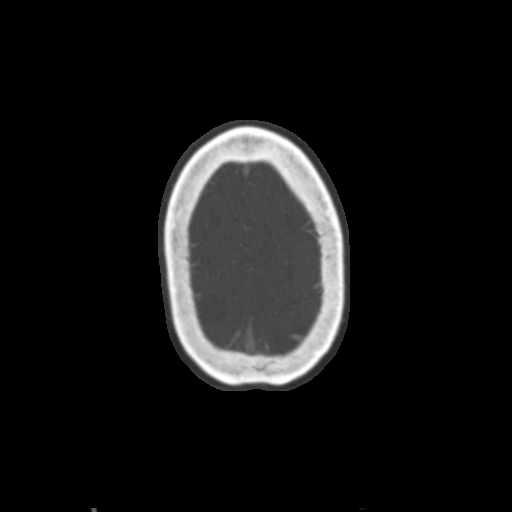

[16 of 47 positions shown; findings below may reference images not displayed]

FINDINGS: CTA NECK FINDINGS

Aortic arch: Standard branching. Atherosclerotic calcification of
the visualized aortic arch and major branch vessels.

Right carotid system: Scattered atherosclerotic plaque within the
common carotid artery without significant stenosis. Calcified and
noncalcified atherosclerotic plaque at the carotid bifurcation with
less than 50% luminal narrowing of the proximal cervical right ICA.
Distal to this, the ICA is widely patent within the neck

Left carotid system: Scattered atherosclerotic plaque within the
common carotid artery without significant stenosis. Calcified and
noncalcified atherosclerotic plaque at the left carotid bifurcation
with less than 50% narrowing of the proximal cervical ICA. Distal to
this, the ICA is widely patent within the neck.

Vertebral arteries: Calcified plaque at the origin of the right
vertebral artery. However, right vertebral artery is patent
throughout the neck without significant stenosis (50% or greater).
Prominent calcified plaque at the origin of the left vertebral
artery with suspected at least moderate ostial narrowing. Distal to
this, the left vertebral artery is patent within the neck without
significant stenosis (50% or greater).

Skeleton: Cervical spondylosis with multilevel bony neural foraminal
narrowing. No high-grade bony spinal canal stenosis.

Other neck: No soft tissue neck mass or pathologically enlarged
cervical chain lymph nodes.

Upper chest: No confluent consolidation within the imaged lung
apices. Partially visualized right shoulder prosthesis.

Review of the MIP images confirms the above findings

CTA HEAD FINDINGS

Anterior circulation: Calcified plaque in the right internal carotid
artery siphon with regions of mild luminal narrowing. Calcified
plaque within the left internal carotid artery siphon. Apparent
mild-to-moderate narrowing of the paraclinoid left internal carotid
artery. Additional regions of mild luminal narrowing within the left
carotid siphon. The right middle and anterior cerebral arteries are
patent without significant proximal stenosis. The left middle and
anterior cerebral arteries are patent without significant proximal
stenosis. No intracranial aneurysm is identified.

Posterior circulation: The intracranial vertebral arteries are
patent without significant stenosis. Atherosclerotic irregularity of
the basilar artery with a mild-to-moderate focal stenosis within the
mid basilar artery (series 12, image 496) (series 9, image 95). The
bilateral posterior cerebral arteries are patent without significant
proximal stenosis. Predominantly fetal origin of the right posterior
cerebral artery.

Venous sinuses: Within the limitations of contrast timing, no
evidence of thrombosis.

Anatomic variants: None significant

Review of the MIP images confirms the above findings

These results were called by telephone at the time of interpretation
on [DATE] at [DATE] to Dr. LEONELA , who verbally
acknowledged these results.
IMPRESSION: CTA head:

No intracranial large vessel occlusion.

Calcified plaque within the intracranial carotid artery siphons.
Resultant mild-to-moderate narrowing of the paraclinoid left
internal carotid artery. Additional sites of mild luminal narrowing
within the intracranial carotid artery siphons bilaterally.

Atherosclerotic irregularity of the basilar artery with a focal mild
to moderate stenosis within the mid basilar artery.

CTA neck:

Atherosclerotic disease within the visualized aortic arch and major
branch vessels as described.

The bilateral common carotid, internal carotid and vertebral
arteries are patent within the neck. Plaque at the carotid
bifurcations results in less than 50% narrowing of the proximal
cervical ICAs bilaterally. Prominent calcified plaque at the origin
of the left vertebral artery with suspected at least moderate ostial
narrowing.

## 2019-01-17 MED ORDER — SODIUM CHLORIDE 0.9 % IV SOLN
INTRAVENOUS | Status: DC | PRN
  Administered 2019-01-17: 23:00:00 250 mL via INTRAVENOUS

## 2019-01-17 MED ORDER — SODIUM CHLORIDE 0.9 % IV SOLN: 50.0000 mL | Freq: Once | INTRAVENOUS | Status: DC

## 2019-01-17 MED ORDER — ALTEPLASE (STROKE) FULL DOSE INFUSION: 0.9000 mg/kg | Freq: Once | INTRAVENOUS | Status: DC

## 2019-01-17 MED ORDER — ACETAMINOPHEN 325 MG PO TABS: 650.0000 mg | ORAL_TABLET | ORAL | Status: DC | PRN

## 2019-01-17 MED ORDER — SODIUM CHLORIDE 0.9% FLUSH: 3.0000 mL | Freq: Once | INTRAVENOUS | Status: DC

## 2019-01-17 MED ORDER — ALTEPLASE (STROKE) FULL DOSE INFUSION
0.9000 mg/kg | Freq: Once | INTRAVENOUS | Status: AC
  Administered 2019-01-17: 17:00:00 56.1 mg via INTRAVENOUS
  Filled 2019-01-17: qty 100

## 2019-01-17 MED ORDER — IOHEXOL 350 MG/ML SOLN
75.0000 mL | Freq: Once | INTRAVENOUS | Status: AC | PRN
  Administered 2019-01-17: 17:00:00 75 mL via INTRAVENOUS

## 2019-01-17 MED ORDER — POTASSIUM CHLORIDE 10 MEQ/100ML IV SOLN
10.0000 meq | INTRAVENOUS | Status: AC
  Administered 2019-01-17 – 2019-01-18 (×2): 10 meq via INTRAVENOUS
  Filled 2019-01-17 (×2): qty 100

## 2019-01-17 MED ORDER — SERTRALINE HCL 50 MG PO TABS
25.0000 mg | ORAL_TABLET | Freq: Every day | ORAL | Status: DC
  Administered 2019-01-19 – 2019-01-21 (×3): 25 mg via ORAL
  Filled 2019-01-17 (×3): qty 1

## 2019-01-17 MED ORDER — NICARDIPINE HCL IN NACL 20-0.86 MG/200ML-% IV SOLN
0.0000 mg/h | INTRAVENOUS | Status: DC | PRN
  Administered 2019-01-17 (×2): 7.5 mg/h via INTRAVENOUS
  Administered 2019-01-17 – 2019-01-18 (×2): 5 mg/h via INTRAVENOUS
  Filled 2019-01-17: qty 200
  Filled 2019-01-17: qty 400
  Filled 2019-01-17: qty 200

## 2019-01-17 MED ORDER — SODIUM CHLORIDE 0.9 % IV SOLN
50.0000 mL/h | INTRAVENOUS | Status: DC
  Administered 2019-01-17 – 2019-01-20 (×5): 50 mL/h via INTRAVENOUS

## 2019-01-17 MED ORDER — PANTOPRAZOLE SODIUM 40 MG IV SOLR
40.0000 mg | Freq: Every day | INTRAVENOUS | Status: DC
  Administered 2019-01-17 – 2019-01-18 (×2): 40 mg via INTRAVENOUS
  Filled 2019-01-17 (×2): qty 40

## 2019-01-17 MED ORDER — LABETALOL HCL 5 MG/ML IV SOLN
10.0000 mg | Freq: Once | INTRAVENOUS | Status: AC
  Administered 2019-01-17: 18:00:00 10 mg via INTRAVENOUS

## 2019-01-17 MED ORDER — SERTRALINE HCL 25 MG PO TABS: 25.0000 mg | ORAL_TABLET | Freq: Every day | ORAL | Status: DC

## 2019-01-17 MED ORDER — LABETALOL HCL 5 MG/ML IV SOLN
10.0000 mg | Freq: Once | INTRAVENOUS | Status: AC | PRN
  Administered 2019-01-20: 10 mg via INTRAVENOUS
  Filled 2019-01-17 (×2): qty 4

## 2019-01-17 MED ORDER — SODIUM CHLORIDE 0.9 % IV SOLN
50.0000 mL | Freq: Once | INTRAVENOUS | Status: AC
  Administered 2019-01-17: 50 mL via INTRAVENOUS

## 2019-01-17 MED ORDER — CHLORHEXIDINE GLUCONATE CLOTH 2 % EX PADS
6.0000 | MEDICATED_PAD | Freq: Every day | CUTANEOUS | Status: DC
  Administered 2019-01-17 – 2019-01-21 (×3): 6 via TOPICAL

## 2019-01-17 MED ORDER — ACETAMINOPHEN 650 MG RE SUPP: 650.0000 mg | RECTAL | Status: DC | PRN

## 2019-01-17 MED ORDER — ACETAMINOPHEN 160 MG/5ML PO SOLN: 650.0000 mg | ORAL | Status: DC | PRN

## 2019-01-17 MED ORDER — STROKE: EARLY STAGES OF RECOVERY BOOK
Freq: Once | Status: DC
  Filled 2019-01-17: qty 1

## 2019-01-17 NOTE — Progress Notes (Signed)
I discussed code status with daughter. She discussed with her siblings, and they are in agreement that if she were to get worse(e.g. need chest compressions or breathing tube) she would not want those aggerssive measures taken. Full support short of this for now.   I placed DNR order.   Roland Rack, MD [facility] [phone]  If 7pm- 7am, please page neurology on call as listed in [place].

## 2019-01-17 NOTE — ED Provider Notes (Signed)
[facility] EMERGENCY DEPARTMENT Provider Note   CSN: [id number] Arrival date & time: 01/17/19  1645  An emergency department physician performed an initial assessment on this suspected stroke patient at 1648.  History   Chief Complaint No chief complaint on file.   HPI [name] is a 85 y.o. female.     HPI Patient arrives is code stroke.  Neurology has evaluated and identified significant right sided deficit.  Determination made to initiate TPA.  Patient does not have contributory history.  She reports that her face does not feel right.  Patient lives in an assisted living.  She has a caregiver.  Patient took a nap at 2 PM and then at 245 awakened and was noted to have a right sided weakness. Past Medical History:  Diagnosis Date  . Adenomatous colon polyp 2007  . Arthritis   . Bilateral carotid bruits 07/27/2018  . CAD (coronary artery disease)   . Chronic back pain   . Gall stone pancreatitis   . Heart murmur   . High cholesterol   . Hx: UTI (urinary tract infection)    Now infrequent  . Hypertension   . Left knee pain Nov. 18, 2014  . Onychomycosis   . Osteoarthritis   . Osteopenia   . PAD (peripheral artery disease) ([place])   . PVD (peripheral vascular disease) ([place])   . Skin ulcer of left heel ([place])   . Trigger finger, right   . Vertigo     Patient Active Problem List   Diagnosis Date Noted  . Stroke (cerebrum) ([place]) 01/17/2019  . PAD (peripheral artery disease) ([place]) 09/27/2018  . Bilateral carotid bruits 07/27/2018  . Pain in limb-Left Knee 04/04/2013  . Peripheral vascular disease, unspecified ([place]) 04/04/2013  . Choledocholithiasis with obstruction 10/30/2011  . PVD (peripheral vascular disease) ([place]) 10/29/2011  . Pancreatitis 10/28/2011  . Cholelithiasis 10/28/2011  . UTI (urinary tract infection) 10/28/2011  . Hypertension 10/28/2011  . Hypercholesterolemia 10/28/2011    Past Surgical History:  Procedure Laterality Date   . ABDOMINAL HYSTERECTOMY     partial  . CHOLECYSTECTOMY  10/30/2011   Procedure: LAPAROSCOPIC CHOLECYSTECTOMY WITH INTRAOPERATIVE CHOLANGIOGRAM;  Surgeon: Zenovia Jarred, MD;  Location: [facility];  Service: General;  Laterality: N/A;  . ENDARTERECTOMY FEMORAL Left 09/27/2018   Procedure: left Endarterectomy Femoral;  Surgeon: Angelia Mould, MD;  Location: [facility];  Service: Vascular;  Laterality: Left;  . EUS  11/11/2011   Procedure: ESOPHAGEAL ENDOSCOPIC ULTRASOUND (EUS) RADIAL;  Surgeon: Arta Silence, MD;  Location: [facility];  Service: Endoscopy;  Laterality: N/A;  possible ERCP  . FEMORAL-POPLITEAL BYPASS GRAFT Left 09/27/2018   Procedure: left FEMORAL THROMBECTOMY;  Surgeon: Angelia Mould, MD;  Location: [facility];  Service: Vascular;  Laterality: Left;  . JOINT REPLACEMENT Left 06-22-06   Hip  . JOINT REPLACEMENT Right 11-26-06   Shoulder  . JOINT REPLACEMENT Right 02-27-06   Knee  . LOWER EXTREMITY ANGIOGRAPHY N/A 09/27/2018   Procedure: LOWER EXTREMITY ANGIOGRAPHY;  Surgeon: Nigel Mormon, MD;  Location: [facility];  Service: Cardiovascular;  Laterality: N/A;  . PATCH ANGIOPLASTY Left 09/27/2018   Procedure: Patch Angioplasty of left femoral artery using xenosure bovine pericardium patch;  Surgeon: Angelia Mould, MD;  Location: [facility];  Service: Vascular;  Laterality: Left;  . PERIPHERAL VASCULAR INTERVENTION  09/27/2018   Procedure: PERIPHERAL VASCULAR INTERVENTION;  Surgeon: Nigel Mormon, MD;  Location: [facility];  Service: Cardiovascular;;  .  TOTAL HIP ARTHROPLASTY    . TOTAL KNEE ARTHROPLASTY    . TOTAL SHOULDER ARTHROPLASTY       OB History   No obstetric history on file.      Home Medications    Prior to Admission medications   Medication Sig Start Date End Date Taking? Authorizing Provider  acetaminophen (TYLENOL) 650 MG CR tablet Take 650 mg by mouth 3 (three) times daily.    [provider]  amLODipine  (NORVASC) 5 MG tablet Take 1 tablet (5 mg total) by mouth daily. 10/07/18   Miquel Dunn, NP  aspirin 81 MG tablet Take 81 mg by mouth daily.    [provider]  Carboxymethylcellulose Sodium (THERATEARS OP) Place 1 drop into the right eye daily as needed (dry eyes).    [provider]  cetirizine (ZYRTEC) 10 MG tablet Take 10 mg by mouth daily.    [provider]  cholecalciferol (VITAMIN D) 1000 UNITS tablet Take 1,000 Units by mouth daily.    [provider]  clindamycin (CLEOCIN) 150 MG capsule Take 600 mg by mouth See admin instructions. Take 600 mg 1 hour prior to dental work    [provider]  clopidogrel (PLAVIX) 75 MG tablet TAKE 1 TABLET(75 MG) BY MOUTH DAILY 11/08/18   Adrian Prows, MD  diclofenac sodium (VOLTAREN) 1 % GEL Apply 1 application topically 4 (four) times daily as needed (arthritis pain).    [provider]  famotidine (PEPCID) 40 MG tablet Take 40 mg by mouth as needed.     [provider]  hydrALAZINE (APRESOLINE) 25 MG tablet Take 1 tablet (25 mg total) by mouth daily as needed (systolic bp goes over [id number]). 11/09/18   Miquel Dunn, NP  losartan (COZAAR) 100 MG tablet Take 100 mg by mouth daily.    [provider]  meclizine (ANTIVERT) 12.5 MG tablet Take 12.5 mg by mouth 3 (three) times daily as needed for dizziness.    [provider]  Menthol (ICY HOT) 5 % PTCH Apply 1 patch topically daily as needed (pain).    [provider]  nystatin (MYCOSTATIN/NYSTOP) powder Apply 15 g topically daily as needed (rash).    [provider]  Pitavastatin Calcium (LIVALO) 2 MG TABS Take 1 mg by mouth every evening.    [provider]  PRESCRIPTION MEDICATION Apply 1 application topically 3 (three) times daily as needed (back pain). Baclofen 5%, Diclofenac 3%, Gabapentin 6% mix 1g with 2g of lidocaine    [provider]  sertraline (ZOLOFT) 25 MG tablet Take 25  mg by mouth daily.    [provider]  traMADol (ULTRAM) 50 MG tablet Take 25 mg by mouth 3 (three) times daily.     [provider]    Family History Family History  Problem Relation Age of Onset  . Cancer Mother        ovarian  . Pneumonia Father   . Heart attack Brother   . Heart disease Brother        Heart Disease before age 35    Social History Social History   Tobacco Use  . Smoking status: Never Smoker  . Smokeless tobacco: Never Used  Substance Use Topics  . Alcohol use: No    Alcohol/week: 0.0 standard drinks  . Drug use: No     Allergies   Hydrochlorothiazide, Penicillins, and Sulfa antibiotics   Review of Systems Review of Systems Level 5 caveat cannot obtain review  of systems due to condition.  Physical Exam Updated Vital Signs BP (!) 152/81 (BP Location: Right Arm)   Pulse 85   Temp (!) 97.4 F (36.3 C) (Oral)   Resp 16   Wt 62.3 kg   SpO2 98%   BMI 23.58 kg/m   Physical Exam Constitutional:      Appearance: Normal appearance.  HENT:     Head: Normocephalic and atraumatic.     Mouth/Throat:     Mouth: Mucous membranes are moist.     Pharynx: Oropharynx is clear.  Eyes:     Comments: Patient has gaze preference to the left.  She can look to midline is slightly past but does not do so spontaneously.  Neck:     Musculoskeletal: Neck supple.  Cardiovascular:     Rate and Rhythm: Normal rate and regular rhythm.  Pulmonary:     Effort: Pulmonary effort is normal.     Breath sounds: Normal breath sounds.  Abdominal:     General: There is no distension.     Palpations: Abdomen is soft.     Tenderness: There is no abdominal tenderness. There is no guarding.  Musculoskeletal: Normal range of motion.        General: No swelling or tenderness.  Skin:    General: Skin is warm and dry.  Neurological:     Mental Status: She is alert.     Comments: The patient has left gaze preference.  She does respond to speech with some  simple answers.  Speech is limited.  She does follow commands to form grip strength both upper extremities.  Difficult to tell if patient is making attempts to move lower extremities at command.  She cannot at command elevate or hold either.      ED Treatments / Results  Labs (all labs ordered are listed, but only abnormal results are displayed) Labs Reviewed  I-STAT CHEM 8, ED - Abnormal; Notable for the following components:      Result Value   Potassium 3.2 (*)    All other components within normal limits  CBC  DIFFERENTIAL  PROTIME-INR  APTT  COMPREHENSIVE METABOLIC PANEL  CBC  HEMOGLOBIN A1C  LIPID PANEL  CBG MONITORING, ED    EKG EKG Interpretation  Date/Time:  Tuesday January 17 2019 17:18:28 EDT Ventricular Rate:  87 PR Interval:    QRS Duration: 87 QT Interval:  389 QTC Calculation: 468 R Axis:   61 Text Interpretation:  Sinus rhythm Ventricular premature complex Borderline repolarization abnormality no stemi.no sig change from previous Confirmed by Charlesetta Shanks [id number]) on 01/17/2019 6:01:55 PM   Radiology Ct Head Code Stroke Wo Contrast  Result Date: 01/17/2019 CLINICAL DATA:  Code stroke. Focal neuro deficit, less than 6 hours, stroke suspected. Additional history: Difficulty speaking, last known well 14:00 EXAM: CT HEAD WITHOUT CONTRAST TECHNIQUE: Contiguous axial images were obtained from the base of the skull through the vertex without intravenous contrast. COMPARISON:  Head CT 06/11/2017, brain MRI 09/18/2014 FINDINGS: Brain: There is no acute intracranial hemorrhage. No evidence of acute demarcated cortical infarction. Again demonstrated is advanced chronic small vessel ischemic disease. Redemonstrated chronic lacunar infarct within the left thalamus. No evidence of intracranial mass. No midline shift or extra-axial fluid collection. Moderate generalized parenchymal atrophy. Vascular: No hyperdense vessel. Atherosclerotic calcification of the carotid artery  siphons. Skull: No calvarial fracture. Sinuses/Orbits: No significant paranasal sinus disease. Chronic deformity of the left maxillary sinus. No significant mastoid effusion. Other: Bilateral lens replacements. The imaged  orbits demonstrate no acute abnormality. ASPECTS ([place] Stroke Program Early CT Score) - Ganglionic level infarction (caudate, lentiform nuclei, internal capsule, insula, M1-M3 cortex): 7 - Supraganglionic infarction (M4-M6 cortex): 3 Total score (0-10 with 10 being normal): 10 These results were communicated to [place] at 5:04 pmon 9/1/2020by text page via the [facility] messaging system. IMPRESSION: No acute intracranial hemorrhage or evidence of acute demarcated cortical infarction. ASPECTS 10 Redemonstrated advanced chronic small vessel ischemic disease with chronic left thalamic lacunar infarct. Moderate generalized parenchymal atrophy. Electronically Signed   By: Kellie Simmering   On: 01/17/2019 17:10    Procedures Procedures (including critical care time)  Medications Ordered in ED Medications  sodium chloride flush (NS) 0.9 % injection 3 mL (has no administration in time range)   stroke: mapping our early stages of recovery book (has no administration in time range)  0.9 %  sodium chloride infusion (has no administration in time range)  acetaminophen (TYLENOL) tablet 650 mg (has no administration in time range)    Or  acetaminophen (TYLENOL) solution 650 mg (has no administration in time range)    Or  acetaminophen (TYLENOL) suppository 650 mg (has no administration in time range)  labetalol (NORMODYNE) injection 10 mg (has no administration in time range)    And  nicardipine (CARDENE) 20mg  in 0.86% saline 232ml IV infusion (0.1 mg/ml) (has no administration in time range)  pantoprazole (PROTONIX) injection 40 mg (has no administration in time range)  iohexol (OMNIPAQUE) 350 MG/ML injection 75 mL (75 mLs Intravenous Contrast Given 01/17/19 1702)     Initial Impression /  Assessment and Plan / ED Course  I have reviewed the triage vital signs and the nursing notes.  Pertinent labs & imaging results that were available during my care of the patient were reviewed by me and considered in my medical decision making (see chart for details).       New onset stroke.  Patient has been seen by neurology and deemed a candidate for TPA.  This is being initiated in the emergency department.  Patient is alert.  Her airway is intact.  Clinically she is well in appearance although does have positive stroke findings on exam.  Patient will be admitted to neurology service.  Final Clinical Impressions(s) / ED Diagnoses   Final diagnoses:  Acute ischemic stroke [facility])    ED Discharge Orders    None       Charlesetta Shanks, MD 01/17/19 [phone]

## 2019-01-17 NOTE — Progress Notes (Signed)
PHARMACIST CODE STROKE RESPONSE  Notified to mix tPA at 1657 by Dr. Leonel Ramsay  Delivered tPA to RN at 1702  tPA dose = 5.6 mg bolus over 1 minute followed by 50.5 mg for a total dose of 56.1 mg over 1 hour  Issues/delays encountered (if applicable): none  Lorel Monaco, PharmD PGY1 Ambulatory Care Resident [employer] # [phone]

## 2019-01-17 NOTE — Code Documentation (Signed)
The patient was last known well at 1400.  She took a nap about shortly after 2pm and when she awoke about 1445 she was "confused"..She arrived via EMS at [facility]  Code Stroke was called in route at 1637.  Stat head CT and Labs done.  Dr Leonel Ramsay at bedside to assess patient.  NIHSS 15.  Aphasia and right side weakness.  Tpa started at 1705.  CTA done. q15 min VS and neuro checks 10mg  Labetalol 75ml NS bolus given post TPA  Admit 4N ICU

## 2019-01-17 NOTE — H&P (Signed)
Neurology H&P  CC: Aphasia  History is obtained from: Daughter  HPI: [name] is a 85 y.o. female with a history of coronary artery disease who presents with abrupt onset of confusion after a nap.  She laid down sometime between 2 and 2:15 PM and following the nap at around 245 she was confused.  Due to the abrupt onset of confusion EMS was called and the patient was brought in as a code stroke.  On arrival, she was noted to have right-sided weakness and aphasia and she was treated with IV TPA after discussion of risks/benefits with the daughter.  CTA was performed which did not demonstrate any large vessel occlusion.  While she was in the ER, she had a few runs of rapid heart rate concerning for atrial fibrillation, but these were not captured on EKG.  LKW: 2 PM tpa given?:  Yes Modified Rankin Scale: 3-Moderate disability-requires help but walks WITHOUT assistance  ROS: Unable to obtain due to altered mental status.   Past Medical History:  Diagnosis Date  . Adenomatous colon polyp 2007  . Arthritis   . Bilateral carotid bruits 07/27/2018  . CAD (coronary artery disease)   . Chronic back pain   . Gall stone pancreatitis   . Heart murmur   . High cholesterol   . Hx: UTI (urinary tract infection)    Now infrequent  . Hypertension   . Left knee pain Nov. 18, 2014  . Onychomycosis   . Osteoarthritis   . Osteopenia   . PAD (peripheral artery disease) ([place])   . PVD (peripheral vascular disease) ([place])   . Skin ulcer of left heel ([place])   . Trigger finger, right   . Vertigo      Family History  Problem Relation Age of Onset  . Cancer Mother        ovarian  . Pneumonia Father   . Heart attack Brother   . Heart disease Brother        Heart Disease before age 62     Social History:  reports that she has never smoked. She has never used smokeless tobacco. She reports that she does not drink alcohol or use drugs.   Exam: Current vital signs: BP (!) 161/84   Pulse  97   Resp (!) 26   Wt 62.3 kg   SpO2 100%   BMI 23.58 kg/m  Vital signs in last 24 hours: Pulse Rate:  [84-97] 97 (09/01 1745) Resp:  [13-26] 26 (09/01 1800) BP: (152-192)/(71-84) 161/84 (09/01 1800) SpO2:  [98 %-100 %] 100 % (09/01 1745) Weight:  [62.3 kg] 62.3 kg (09/01 1721)  Physical Exam  Constitutional: Appears well-developed and well-nourished.  Psych: Affect appropriate to situation Eyes: No scleral injection HENT: No OP obstrucion Head: Normocephalic.  Cardiovascular: Normal rate and regular rhythm.  Respiratory: Effort normal and breath sounds normal to anterior ascultation GI: Soft.  No distension. There is no tenderness.  Skin: WDI  Neuro: Mental Status: Patient is densely aphasic, she does not follow commands, speech is garbled and not words. Cranial Nerves: II: She has a right hemianopia pupils are equal, round, and reactive to light.   III,IV, VI: Left gaze preference V: Facial sensation is symmetric to temperature VII: Facial movement  with mild right facial weakness VIII: hearing is intact to voice X: Uvula elevates symmetrically XI: Shoulder shrug is symmetric. XII: tongue is midline without atrophy or fasciculations.  Motor: She has drift without mobility keep her arm up  in both the arm and leg on the right, she does have tone in movement, 3/5 Sensory: She response to noxious stimulation equally Cerebellar: No clear ataxia on the left   I have reviewed labs in epic and the results pertinent to this consultation are: Sodium 140, CMP unremarkable  I have reviewed the images obtained: CT head-no hemorrhage, CTA-no LVO  Primary Diagnosis:  Cerebral infarction due to embolism of right middle cerebral artery  Secondary Diagnosis: Hypertension Emergency (SBP > 180 or DBP > 120 & end organ damage) and Paroxysmal atrial fibrillation  Impression: 85 year old female with right-sided weakness and aphasia status post IV TPA.  I suspect embolic disease from  atrial fibrillation, though she does not have a history of this.  She will need to be monitored closely in the ICU.  Recommendations: - HgbA1c, fasting lipid panel - MRI, MRA  of the brain without contrast - Frequent neuro checks - Echocardiogram - Carotid dopplers - Prophylactic therapy-none for 24 hours - Risk factor modification - Telemetry monitoring - PT consult, OT consult, Speech consult -Home hold BP meds, give home sertraline - Stroke team to follow    This patient is critically ill and at significant risk of neurological worsening, death and care requires constant monitoring of vital signs, hemodynamics,respiratory and cardiac monitoring, neurological assessment, discussion with family, other specialists and medical decision making of high complexity. I spent 50 minutes of neurocritical care time  in the care of  this patient. This was time spent independent of any time provided by nurse practitioner or PA.  Roland Rack, MD [facility] [phone]  If 7pm- 7am, please page neurology on call as listed in [place]. 01/17/2019  6:13 PM

## 2019-01-18 ENCOUNTER — Inpatient Hospital Stay (HOSPITAL_COMMUNITY): Payer: Medicare Other

## 2019-01-18 ENCOUNTER — Encounter (HOSPITAL_COMMUNITY): Payer: Medicare Other

## 2019-01-18 DIAGNOSIS — I351 Nonrheumatic aortic (valve) insufficiency: Secondary | ICD-10-CM

## 2019-01-18 DIAGNOSIS — I361 Nonrheumatic tricuspid (valve) insufficiency: Secondary | ICD-10-CM

## 2019-01-18 DIAGNOSIS — I6300 Cerebral infarction due to thrombosis of unspecified precerebral artery: Secondary | ICD-10-CM

## 2019-01-18 LAB — CBC
HCT: 33 % — ABNORMAL LOW (ref 36.0–46.0)
Hemoglobin: 10.7 g/dL — ABNORMAL LOW (ref 12.0–15.0)
MCH: 31.8 pg (ref 26.0–34.0)
MCHC: 32.4 g/dL (ref 30.0–36.0)
MCV: 97.9 fL (ref 80.0–100.0)
Platelets: 193 10*3/uL (ref 150–400)
RBC: 3.37 MIL/uL — ABNORMAL LOW (ref 3.87–5.11)
RDW: 12.5 % (ref 11.5–15.5)
WBC: 9.8 10*3/uL (ref 4.0–10.5)
nRBC: 0 % (ref 0.0–0.2)

## 2019-01-18 LAB — LIPID PANEL
Cholesterol: 114 mg/dL (ref 0–200)
HDL: 46 mg/dL (ref >40)
LDL Cholesterol: 61 mg/dL (ref 0–99)
Total CHOL/HDL Ratio: 2.5 RATIO
Triglycerides: 37 mg/dL (ref <150)
VLDL: 7 mg/dL (ref 0–40)

## 2019-01-18 LAB — ECHOCARDIOGRAM COMPLETE: Weight: 2197.55 oz

## 2019-01-18 LAB — HEMOGLOBIN A1C
Hgb A1c MFr Bld: 5.4 % (ref 4.8–5.6)
Mean Plasma Glucose: 108.28 mg/dL

## 2019-01-18 IMAGING — MR MR HEAD W/O CM
13 series · 48 of 48 positions shown · non-contrast
Comparison: CT head [DATE], MRI [DATE]

CLINICAL DATA: Stroke.  Post tPA.

EXAM:
MRI HEAD WITHOUT CONTRAST
TECHNIQUE: Multiplanar, multiecho pulse sequences of the brain and surrounding
structures were obtained without intravenous contrast.

[Series 5: DWI · axial · 3.0mm · 0.88mm/px · z∈[+12,+131]mm · 9 of 84 slices shown (1 of 6)]
[im 1/84]
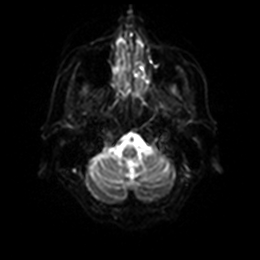
[im 11/84]
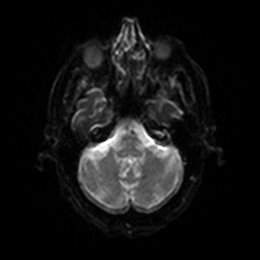
[im 21/84]
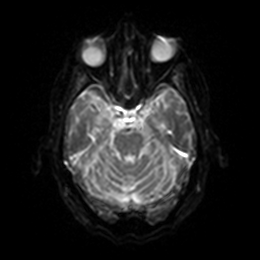
[im 32/84]
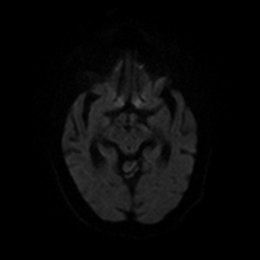
[im 42/84]
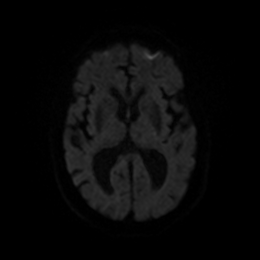
[im 52/84]
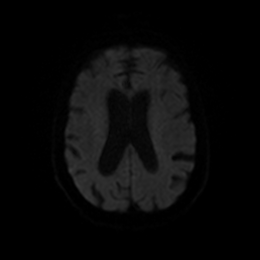
[im 63/84]
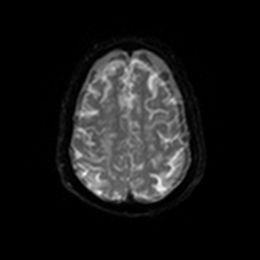
[im 73/84]
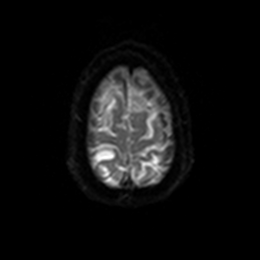
[im 84/84]
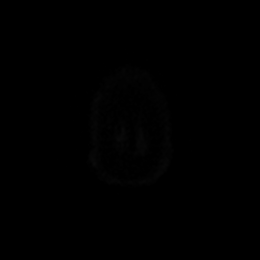

[Series 6: DWI · axial · 3.0mm · 0.88mm/px · z∈[+12,+131]mm · 5 of 42 slices shown (2 of 6)]
[im 1/42]
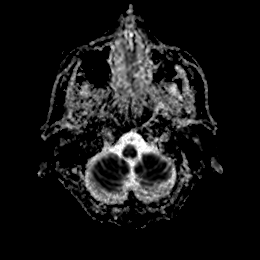
[im 11/42]
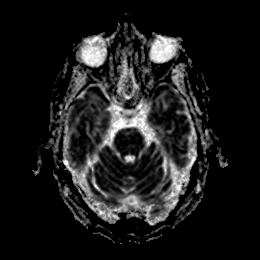
[im 21/42]
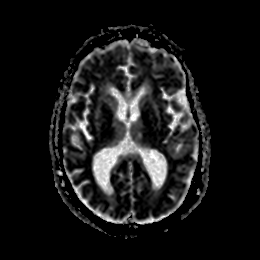
[im 31/42]
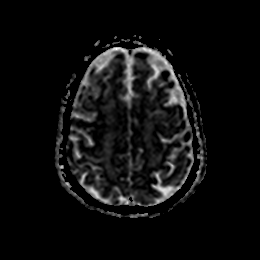
[im 42/42]
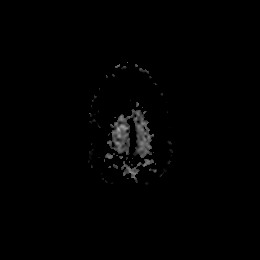

[Series 7: DWI · coronal · 4.0mm · 0.88mm/px · 7 of 64 slices shown (3 of 6)]
[im 1/64]
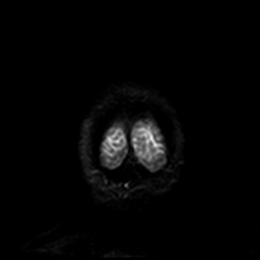
[im 11/64]
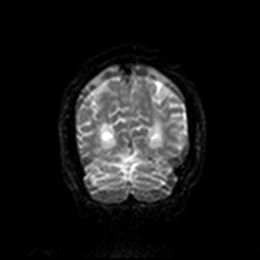
[im 22/64]
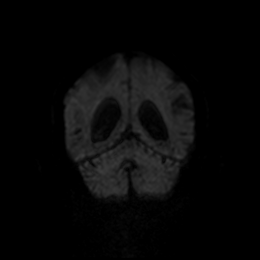
[im 32/64]
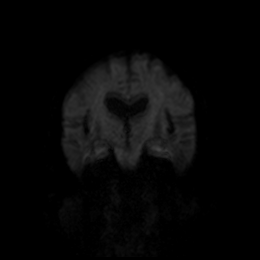
[im 43/64]
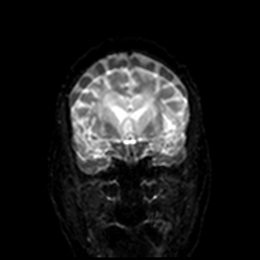
[im 53/64]
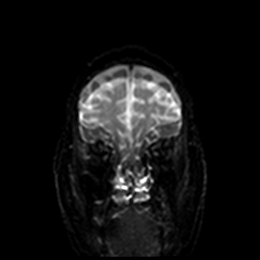
[im 64/64]
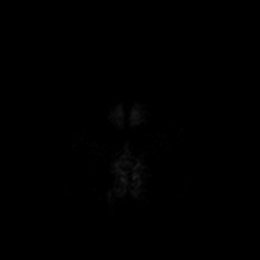

[Series 8: DWI · coronal · 4.0mm · 0.88mm/px · 3 of 32 slices shown (4 of 6)]
[im 1/32]
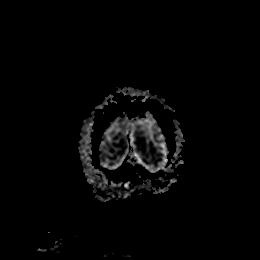
[im 16/32]
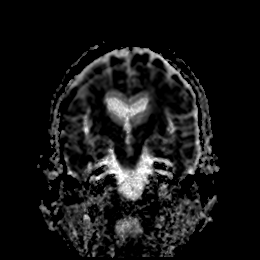
[im 32/32]
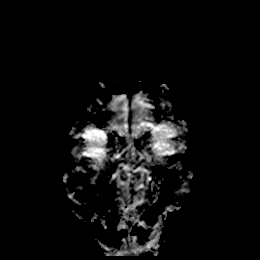

[Series 9: T1 · sagittal · 5.0mm · 0.75mm/px · 2 of 23 slices shown]
[im 1/23]
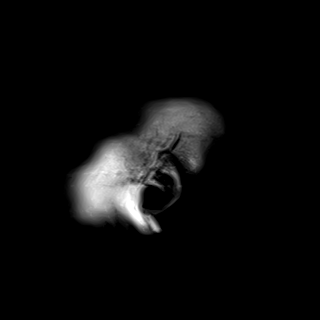
[im 23/23]
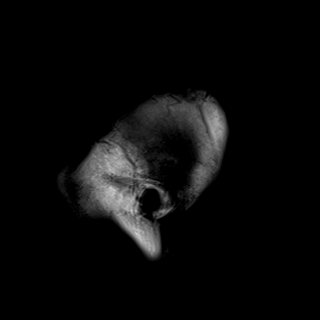

[Series 10: DWI · coronal · 4.0mm · 0.88mm/px · 6 of 64 slices shown (5 of 6)]
[im 1/64]
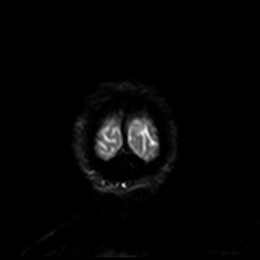
[im 13/64]
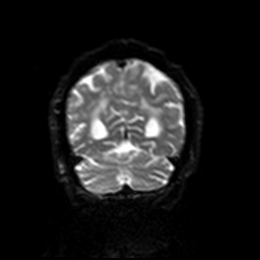
[im 26/64]
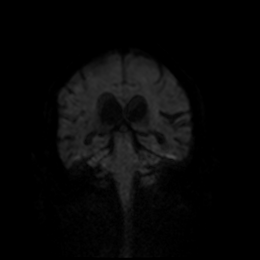
[im 38/64]
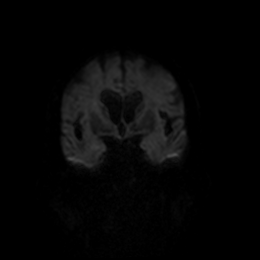
[im 51/64]
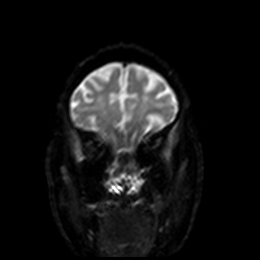
[im 64/64]
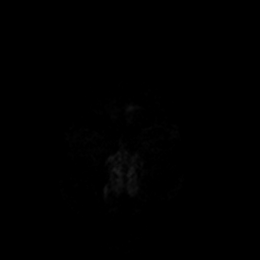

[Series 11: DWI · coronal · 4.0mm · 0.88mm/px · 3 of 32 slices shown (6 of 6)]
[im 1/32]
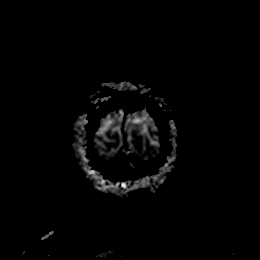
[im 16/32]
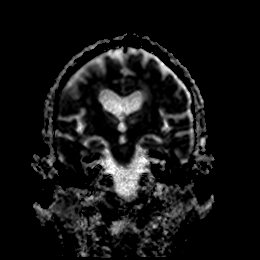
[im 32/32]
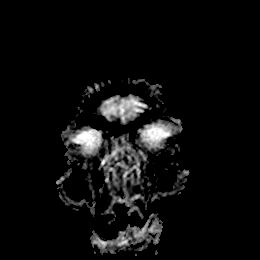

[Series 12: T2 · axial · 5.0mm · 0.72mm/px · z∈[+0,+140]mm · 2 of 25 slices shown (1 of 2)]
[im 1/25]
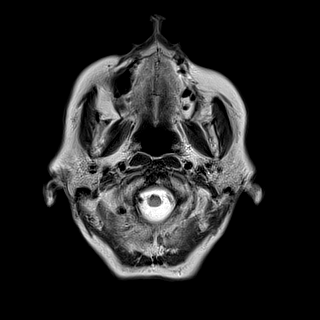
[im 25/25]
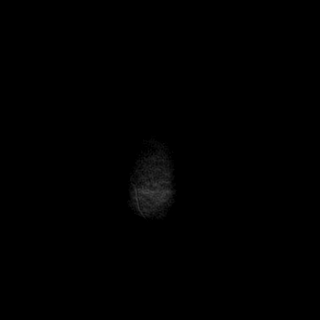

[Series 13: FLAIR · axial · 5.0mm · 0.90mm/px · z∈[+2,+141]mm · 2 of 25 slices shown (1 of 2)]
[im 1/25]
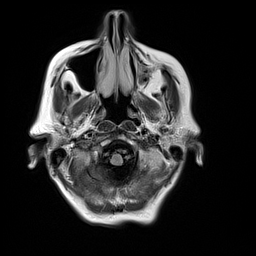
[im 25/25]
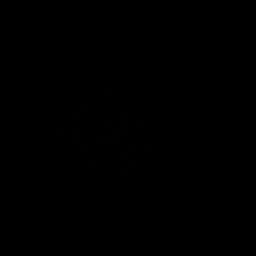

[Series 14: ax hemo · axial · 5.0mm · 0.86mm/px · z∈[-7,+133]mm · 2 of 25 slices shown]
[im 1/25]
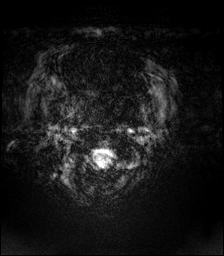
[im 25/25]
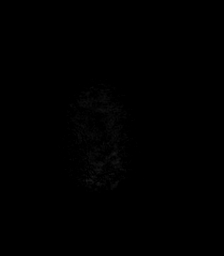

[Series 15: FLAIR · axial · 5.0mm · 0.90mm/px · z∈[-8,+132]mm · 2 of 25 slices shown (2 of 2)]
[im 1/25]
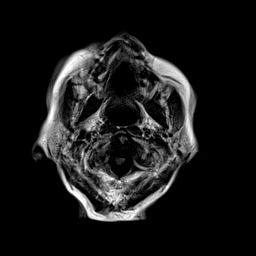
[im 25/25]
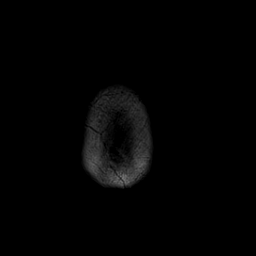

[Series 17: t1_ep2d_tra_p2 · axial · 5.0mm · 0.60mm/px · z∈[-36,+119]mm · 2 of 25 slices shown]
[im 1/25]
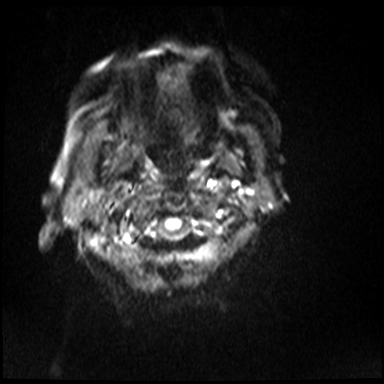
[im 25/25]
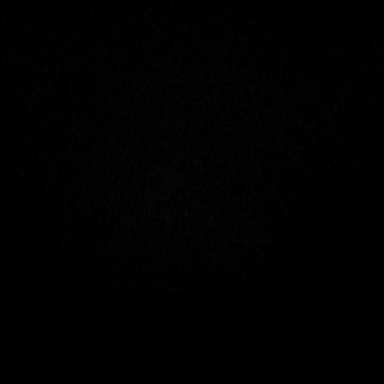

[Series 18: T2 · coronal · 5.0mm · 0.72mm/px · 3 of 28 slices shown (2 of 2)]
[im 1/28]
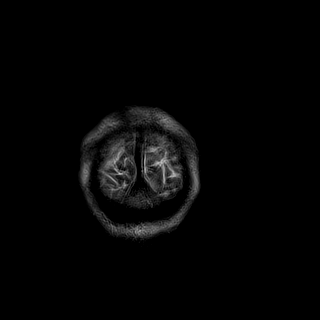
[im 14/28]
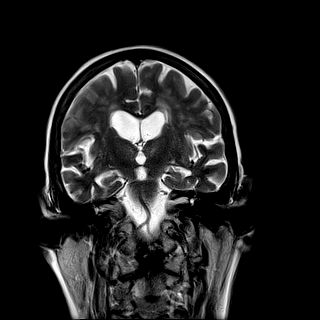
[im 28/28]
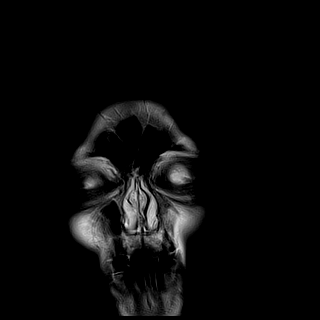

[48 of 48 positions shown; findings below may reference images not displayed]

FINDINGS: Brain: Image quality degraded by significant motion

Generalized atrophy and ventricular enlargement is stable. Moderate
to extensive periventricular deep white matter disease bilaterally.
Chronic infarct left thalamus.

Negative for acute infarct.  Negative for hemorrhage or mass.

Vascular: Normal arterial flow voids.

Skull and upper cervical spine: Negative

Sinuses/Orbits: Mild mucosal edema paranasal sinuses bilaterally.
Bilateral cataract surgery

Other: None
IMPRESSION: Motion degraded study

Negative for acute infarct.

Atrophy and moderate to extensive chronic small vessel ischemia.

## 2019-01-18 MED ORDER — POTASSIUM CHLORIDE 10 MEQ/100ML IV SOLN
10.0000 meq | INTRAVENOUS | Status: AC
  Administered 2019-01-18 (×4): 10 meq via INTRAVENOUS
  Filled 2019-01-18 (×4): qty 100

## 2019-01-18 MED ORDER — ASPIRIN EC 81 MG PO TBEC: 81.0000 mg | DELAYED_RELEASE_TABLET | Freq: Every day | ORAL | Status: DC

## 2019-01-18 MED ORDER — MORPHINE SULFATE (PF) 2 MG/ML IV SOLN: 1.0000 mg | INTRAVENOUS | Status: DC | PRN

## 2019-01-18 MED ORDER — ASPIRIN 300 MG RE SUPP
300.0000 mg | Freq: Every day | RECTAL | Status: DC
  Administered 2019-01-18: 300 mg via RECTAL
  Filled 2019-01-18: qty 1

## 2019-01-18 MED ORDER — HALOPERIDOL LACTATE 5 MG/ML IJ SOLN
1.0000 mg | Freq: Once | INTRAMUSCULAR | Status: AC
  Administered 2019-01-18: 1 mg via INTRAVENOUS
  Filled 2019-01-18: qty 1

## 2019-01-18 NOTE — Evaluation (Signed)
Occupational Therapy Evaluation Patient Details Name: [name] MRN: [id number] DOB: September 02, 1930 Today's Date: 01/18/2019    History of Present Illness y.o. female with a history of coronary artery disease who was admitted from SNF with abrupt onset of confusion after a nap 2/2 L side CVA. On arrival, she was noted to have right-sided weakness and aphasia, was then treated with IV TPA. Head CT revealed advanced chronic small vessel ischemic disease with chronic left thalamic lacunar infarct.    Clinical Impression   PT admitted with L thalamaic lacunar infarct. Pt currently with functional limitiations due to the deficits listed below (see OT problem list). Pt currenlty total +2 mod (A) for basic transfer. Pt internally motivated to transfer due to need to void.  Pt will benefit from skilled OT to increase their independence and safety with adls and balance to allow discharge SNF. Family previously attempting to place at [facility] and pt is waitlisted at that location.     Follow Up Recommendations  SNF    Equipment Recommendations  3 in 1 bedside commode;Wheelchair cushion (measurements OT);Wheelchair (measurements OT)    Recommendations for Other Services       Precautions / Restrictions Precautions Precautions: Fall      Mobility Bed Mobility Overal bed mobility: Needs Assistance Bed Mobility: Supine to Sit     Supine to sit: Mod assist     General bed mobility comments: requries (A) to elevate from surface adn question if [facility] / receptive aphasia increased (A) level requried  Transfers Overall transfer level: Needs assistance Equipment used: 2 person hand held assist Transfers: Stand Pivot Transfers;Sit to/from Stand Sit to Stand: +2 physical assistance;Mod assist Stand pivot transfers: +2 safety/equipment;+2 physical assistance;Mod assist       General transfer comment: required hand held placement for safety     Balance Overall balance assessment: Needs  assistance                                         ADL either performed or assessed with clinical judgement   ADL Overall ADL's : Needs assistance/impaired     Grooming: Minimal assistance;Bed level   Upper Body Bathing: Moderate assistance   Lower Body Bathing: Maximal assistance   Upper Body Dressing : Moderate assistance   Lower Body Dressing: Maximal assistance   Toilet Transfer: Moderate assistance;+2 for physical assistance;+2 for safety/equipment             General ADL Comments: pt motivated to void bladder. pt restless in bed and not using purewick. pt had awareness to need to sit on potty. pt placed on commode and voiding bladder and bowel     Vision   Additional Comments: to be further assessed. pt looking at thearpist and traacking     Perception     Praxis      Pertinent Vitals/Pain Pain Assessment: Faces Faces Pain Scale: Hurts little more Pain Location: generalized Pain Descriptors / Indicators: Grimacing Pain Intervention(s): Monitored during session;Repositioned     Hand Dominance Right   Extremity/Trunk Assessment Upper Extremity Assessment Upper Extremity Assessment: Generalized weakness   Lower Extremity Assessment Lower Extremity Assessment: Defer to PT evaluation   Cervical / Trunk Assessment Cervical / Trunk Assessment: Other exceptions(scolisis)   Communication Communication Communication: HOH   Cognition Arousal/Alertness: Awake/alert Behavior During Therapy: Restless Overall Cognitive Status: Impaired/Different from baseline Area of Impairment: Safety/judgement;Following commands;Awareness  Following Commands: Follows one step commands with increased time;Follows one step commands inconsistently Safety/Judgement: Decreased awareness of safety;Decreased awareness of deficits Awareness: Intellectual   General Comments: pt with global aphasia and needs visual cues to intiate at  times   General Comments  family reports pt falls backward at baseline    Exercises     Shoulder Instructions      Home Living Family/patient expects to be discharged to:: Skilled nursing facility                                 Additional Comments: pt was living in assisted living for one week prior to admission. Family removed patient from indep living at the start of covid and took her home with 24/ caregivers in the home. pt progressively declined and placed in assisted living a week ago. family has been looking at [facility] for SNF level care and pt is waitlisted. pt previously admission at SNF was 4 days due to patient unable to tolerate lack of access to family      Prior Functioning/Environment Level of Independence: Needs assistance    ADL's / Homemaking Assistance Needed: all aspects Communication / Swallowing Assistance Needed: HOH with hearing aides          OT Problem List: Decreased strength;Decreased activity tolerance;Impaired balance (sitting and/or standing);Impaired vision/perception;Decreased coordination;Decreased cognition;Decreased safety awareness;Decreased knowledge of use of DME or AE;Decreased knowledge of precautions      OT Treatment/Interventions: Self-care/ADL training;Therapeutic exercise;Neuromuscular education;Energy conservation;DME and/or AE instruction;Manual therapy;Modalities;Therapeutic activities;Cognitive remediation/compensation;Visual/perceptual remediation/compensation;Patient/family education;Balance training    OT Goals(Current goals can be found in the care plan section) Acute Rehab OT Goals Patient Stated Goal: none stated but expressed need to use bathroom OT Goal Formulation: With patient Time For Goal Achievement: 02/01/19 Potential to Achieve Goals: Good  OT Frequency: Min 2X/week   Barriers to D/C: Decreased caregiver support  family requesting SNF [facility]       Co-evaluation PT/OT/SLP  Co-Evaluation/Treatment: Yes Reason for Co-Treatment: Complexity of the patient's impairments (multi-system involvement);Necessary to address cognition/behavior during functional activity;For patient/therapist safety;To address functional/ADL transfers   OT goals addressed during session: ADL's and self-care;Proper use of Adaptive equipment and DME;Strengthening/ROM      AM-PAC OT "6 Clicks" Daily Activity     Outcome Measure Help from another person eating meals?: A Lot Help from another person taking care of personal grooming?: A Lot Help from another person toileting, which includes using toliet, bedpan, or urinal?: A Lot Help from another person bathing (including washing, rinsing, drying)?: A Lot Help from another person to put on and taking off regular upper body clothing?: A Lot Help from another person to put on and taking off regular lower body clothing?: A Lot 6 Click Score: 12   End of Session Equipment Utilized During Treatment: Gait belt;Rolling walker Nurse Communication: Mobility status;Precautions  Activity Tolerance: Patient tolerated treatment well Patient left: in chair;with call bell/phone within reach;with chair alarm set;with family/visitor present  OT Visit Diagnosis: Unsteadiness on feet (R26.81);Muscle weakness (generalized) (M62.81);Cognitive communication deficit (R41.841) Symptoms and signs involving cognitive functions: Cerebral infarction                Time: 1441-1510 OT Time Calculation (min): 29 min Charges:  OT General Charges $OT Visit: 1 Visit OT Evaluation $OT Eval Moderate Complexity: 1 Mod   Jeri Modena, OTR/L  Acute Rehabilitation Services Pager: [phone] Office: [phone] .  Jeri Modena  01/18/2019, 4:35 PM

## 2019-01-18 NOTE — Evaluation (Cosign Needed)
Clinical/Bedside Swallow Evaluation Patient Details  Name: [name] MRN: [id number] Date of Birth: [birth year]  Today's Date: 01/18/2019 Time: SLP Start Time (ACUTE ONLY): 0940 SLP Stop Time (ACUTE ONLY): 0953 SLP Time Calculation (min) (ACUTE ONLY): 13 min  Past Medical History:  Past Medical History:  Diagnosis Date  . Adenomatous colon polyp 2007  . Arthritis   . Bilateral carotid bruits 07/27/2018  . CAD (coronary artery disease)   . Chronic back pain   . Gall stone pancreatitis   . Heart murmur   . High cholesterol   . Hx: UTI (urinary tract infection)    Now infrequent  . Hypertension   . Left knee pain Nov. 18, 2014  . Onychomycosis   . Osteoarthritis   . Osteopenia   . PAD (peripheral artery disease) ([place])   . PVD (peripheral vascular disease) ([place])   . Skin ulcer of left heel ([place])   . Trigger finger, right   . Vertigo    Past Surgical History:  Past Surgical History:  Procedure Laterality Date  . ABDOMINAL HYSTERECTOMY     partial  . CHOLECYSTECTOMY  10/30/2011   Procedure: LAPAROSCOPIC CHOLECYSTECTOMY WITH INTRAOPERATIVE CHOLANGIOGRAM;  Surgeon: Zenovia Jarred, MD;  Location: [place];  Service: General;  Laterality: N/A;  . ENDARTERECTOMY FEMORAL Left 09/27/2018   Procedure: left Endarterectomy Femoral;  Surgeon: Angelia Mould, MD;  Location: [place];  Service: Vascular;  Laterality: Left;  . EUS  11/11/2011   Procedure: ESOPHAGEAL ENDOSCOPIC ULTRASOUND (EUS) RADIAL;  Surgeon: Arta Silence, MD;  Location: [facility];  Service: Endoscopy;  Laterality: N/A;  possible ERCP  . FEMORAL-POPLITEAL BYPASS GRAFT Left 09/27/2018   Procedure: left FEMORAL THROMBECTOMY;  Surgeon: Angelia Mould, MD;  Location: [place];  Service: Vascular;  Laterality: Left;  . JOINT REPLACEMENT Left 06-22-06   Hip  . JOINT REPLACEMENT Right 11-26-06   Shoulder  . JOINT REPLACEMENT Right 02-27-06   Knee  . LOWER EXTREMITY ANGIOGRAPHY N/A 09/27/2018   Procedure: LOWER  EXTREMITY ANGIOGRAPHY;  Surgeon: Nigel Mormon, MD;  Location: [place] [facility];  Service: Cardiovascular;  Laterality: N/A;  . PATCH ANGIOPLASTY Left 09/27/2018   Procedure: Patch Angioplasty of left femoral artery using xenosure bovine pericardium patch;  Surgeon: Angelia Mould, MD;  Location: [place];  Service: Vascular;  Laterality: Left;  . PERIPHERAL VASCULAR INTERVENTION  09/27/2018   Procedure: PERIPHERAL VASCULAR INTERVENTION;  Surgeon: Nigel Mormon, MD;  Location: [place] [facility];  Service: Cardiovascular;;  . TOTAL HIP ARTHROPLASTY    . TOTAL KNEE ARTHROPLASTY    . TOTAL SHOULDER ARTHROPLASTY     HPI:  [name] is a 85 y.o. female with a history of coronary artery disease who was admitted from SNF with abrupt onset of confusion after a nap 2/2 L side CVA. On arrival, she was noted to have right-sided weakness and aphasia, was then treated with IV TPA. Head CT revealed advanced chronic small vessel ischemic disease with chronic left thalamic lacunar infarct.    Assessment / Plan / Recommendation Clinical Impression  Pt presented confused, anxious and with language impairments and inability to follow simple one-step commands. Aside from inspection during oral care, OME unable to be fully completed due to cognitive limitations. Assisted pt in holding and directing cup to oral cavity. Minimal oral acceptance with each trial of puree.. Multiple effortful, loud swallows were present across both consistencies and were followed by eructation, then a weak cough or throat clear.  Suspected incoordintion, decreased airway protection or esophageal involvement and would need instrumental assessment to fully evaluate. MBS scheduled today at 12:30. NPO with meds crushed in puree.  SLP Visit Diagnosis: Dysphagia, unspecified (R13.10)    Aspiration Risk  Severe aspiration risk    Diet Recommendation NPO        Other  Recommendations Oral Care Recommendations: Oral  care QID   Follow up Recommendations        Frequency and Duration            Prognosis Prognosis for Safe Diet Advancement: Fair Barriers to Reach Goals: Cognitive deficits;Language deficits      Swallow Study   General Date of Onset: 01/17/19 HPI: [name] is a 85 y.o. female with a history of coronary artery disease who was admitted from SNF with abrupt onset of confusion after a nap 2/2 L side CVA. On arrival, she was noted to have right-sided weakness and aphasia, was then treated with IV TPA. Head CT revealed advanced chronic small vessel ischemic disease with chronic left thalamic lacunar infarct.  Type of Study: Bedside Swallow Evaluation Previous Swallow Assessment: (none) Diet Prior to this Study: NPO Temperature Spikes Noted: No Respiratory Status: Room air History of Recent Intubation: No Behavior/Cognition: Confused;Doesn't follow directions;Alert Oral Cavity Assessment: Dried secretions Oral Care Completed by SLP: Yes Oral Cavity - Dentition: Adequate natural dentition Vision: Impaired for self-feeding Self-Feeding Abilities: Needs assist Patient Positioning: Upright in bed Baseline Vocal Quality: Normal Volitional Cough: Cognitively unable to elicit    Oral/Motor/Sensory Function Overall Oral Motor/Sensory Function: (Unable to determine due to cognitive deficits )   Ice Chips Ice chips: Not tested   Thin Liquid Thin Liquid: Impaired Presentation: Cup;Straw Oral Phase Impairments: Poor awareness of bolus Oral Phase Functional Implications: Prolonged oral transit;Oral holding Pharyngeal  Phase Impairments: Multiple swallows;Other (comments);Cough - Immediate(Effortful, audible swallow. Weak unproductive cough. )    Nectar Thick Nectar Thick Liquid: Not tested   Honey Thick Honey Thick Liquid: Not tested   Puree Puree: Impaired Presentation: Spoon Oral Phase Impairments: Poor awareness of bolus(minimal acceptance with each trial ) Oral Phase  Functional Implications: Oral holding;Prolonged oral transit Pharyngeal Phase Impairments: Multiple swallows;Other (comments);Cough - Immediate(Loud, effortful swallow, weak unproductive cough)   Solid     Solid: Not tested      Jaccob Czaplicki 01/18/2019,12:00 PM

## 2019-01-18 NOTE — Progress Notes (Signed)
EEG complete - results pending 

## 2019-01-18 NOTE — Progress Notes (Signed)
Echocardiogram 2D Echocardiogram has been performed.  Oneal Deputy Auston Halfmann 01/18/2019, 4:07 PM

## 2019-01-18 NOTE — Progress Notes (Addendum)
STROKE TEAM PROGRESS NOTE   INTERVAL HISTORY I have personally reviewed history of presenting illness and review of electronic medical records as well as imaging films in PACS.  Patient presented with aphasia and received IV TPA but has not obtain substantial neurological improvement.  Blood pressure has been tightly controlled.  Vitals:   01/18/19 0600 01/18/19 0700 01/18/19 0800 01/18/19 0900  BP: (!) 134/58 (!) 131/52 (!) 117/58 (!) 117/47  Pulse: 88 73 81 68  Resp: 19 19 19 20   Temp:   98.5 F (36.9 C)   TempSrc:   Axillary   SpO2: 96% 91% 91% 93%  Weight:        CBC:  Recent Labs  Lab 01/17/19 1647 01/17/19 1714  WBC 7.2  --   NEUTROABS 4.3  --   HGB 12.0 12.9  HCT 38.4 38.0  MCV 99.0  --   PLT 217  --     Basic Metabolic Panel:  Recent Labs  Lab 01/17/19 1647 01/17/19 1714  NA 140 140  K 3.4* 3.2*  CL 104 102  CO2 26  --   GLUCOSE 100* 93  BUN 11 13  CREATININE 0.83 0.80  CALCIUM 9.2  --    Lipid Panel: No results found for: CHOL, TRIG, HDL, CHOLHDL, VLDL, LDLCALC HgbA1c: No results found for: HGBA1C Urine Drug Screen: No results found for: LABOPIA, COCAINSCRNUR, LABBENZ, AMPHETMU, THCU, LABBARB  Alcohol Level No results found for: ETH  IMAGING Ct Head Code Stroke Wo Contrast 01/17/2019 No acute intracranial hemorrhage or evidence of acute demarcated cortical infarction. ASPECTS 10 Redemonstrated advanced chronic small vessel ischemic disease with chronic left thalamic lacunar infarct. Moderate generalized parenchymal atrophy.   Ct Angio Head W Or Wo Contrast 01/17/2019 No intracranial large vessel occlusion. Calcified plaque within the intracranial carotid artery siphons. Resultant mild-to-moderate narrowing of the paraclinoid left internal carotid artery. Additional sites of mild luminal narrowing within the intracranial carotid artery siphons bilaterally. Atherosclerotic irregularity of the basilar artery with a focal mild to moderate stenosis within the  mid basilar artery.   Ct Angio Neck W Or Wo Contrast 01/17/2019 Atherosclerotic disease within the visualized aortic arch and major branch vessels as described. The bilateral common carotid, internal carotid and vertebral arteries are patent within the neck. Plaque at the carotid bifurcations results in less than 50% narrowing of the proximal cervical ICAs bilaterally. Prominent calcified plaque at the origin of the left vertebral artery with suspected at least moderate ostial narrowing.    PHYSICAL EXAM Pleasant elderly Caucasian lady was not in distress.  She appears to be restless and agitated. . Afebrile. Head is nontraumatic. Neck is supple without bruit.    Cardiac exam no murmur or gallop. Lungs are clear to auscultation. Distal pulses are well felt. Neurological Exam :  She is awake alert globally aphasic.  She does not follow any commands.  She will track and visually follow you.  She does follow occasional commands to mimicking.  She blinks to threat on the left more than the right.  No facial weakness.  Tongue midline.  Motor system exam able to move all 4 extremities purposefully against gravity.  No focal weakness.  Deep tendon flexes symmetric plantars downgoing.  Gait not tested ASSESSMENT/PLAN [name] is a 85 y.o. female with history of CAD presenting with sudden onset confusion after nap with R sided weakness ands aphasia. Received tPA 01/17/2019 at 1705.   Stroke:   Likely L MCA infarct s/p tPA embolic  secondary to possible atrial fibrillation (no formal dx)  Code Stroke CT head No acute abnormality. Old L thalamic infarct. Small vessel disease. Atrophy. ASPECTS 10.     CTA head no ELVO. Scattered plaque. Mod BA stenosis.  CTA neck < 50% ICA narrowing. Mod L VA stenosis.  MRI  pending (haldol on call)  2D Echo pending   LDL pending   HgbA1c pending   SCDs for VTE prophylaxis  aspirin 81 mg daily and clopidogrel 75 mg daily prior to admission, now on No  antithrombotic as within 24h of tPA administration. Plan resume aspirin if 24h imaging neg for hemorrhage.    Therapy recommendations:  Pending. Ok to be OOB  Disposition:  pending   DNR  Possible atrial fibrillation   Few runs of rapid HR in ED not captured on tele  Continue tele monitoring to look for AF  Hypertension  Home meds:  Amlodipine 5, hydralazine 25 if BP > 150, losartan 100  Requiring cardene for BP control post tPA BP goal per post tPA protocol x 24h following tPA administration . Long-term BP goal normotensive  Dysphagia . Secondary to stroke . NPO . Speech on board  Other Stroke Risk Factors  Advanced age  Coronary artery disease  PAD  PVD  Other Active Problems  Hypokalemia 3.4->3.2 replaced w/ 2 runs, for addition 37meq - recheck  Hospital day # 1  I have personally obtained history,examined this patient, reviewed notes, independently viewed imaging studies, participated in medical decision making and plan of care.ROS completed by me personally and pertinent positives fully documented  I have made any additions or clarifications directly to the above note.  She presented with sudden onset of confusion, aphasia and right-sided weakness likely due to left MCA embolic infarct.  She received IV TPA but has not obtain substantial improvement yet.  Maintain strict neurological monitoring and tight blood pressure control as per post TPA protocol.  Mobilize out of bed.  Therapy consults.  Continue ongoing stroke work-up.  No family available for discussion. This patient is critically ill and at significant risk of neurological worsening, death and care requires constant monitoring of vital signs, hemodynamics,respiratory and cardiac monitoring, extensive review of multiple databases, frequent neurological assessment, discussion with family, other specialists and medical decision making of high complexity.I have made any additions or clarifications directly to the  above note.This critical care time does not reflect procedure time, or teaching time or supervisory time of PA/NP/Med Resident etc but could involve care discussion time.  I spent 30 minutes of neurocritical care time  in the care of  this patient.      Antony Contras, MD Medical Director [place] Pager: [phone] 01/18/2019 5:04 PM   To contact Stroke Continuity provider, please refer to [email or web address] After hours, contact General Neurology

## 2019-01-18 NOTE — Progress Notes (Addendum)
Modified Barium Swallow Study    01/18/19 1017  SLP Visit Information  SLP Received On 01/18/19  Pain Assessment  Pain Assessment Faces  Faces Pain Scale 4  Pain Descriptors / Indicators Grimacing;Moaning  Pain Intervention(s) Monitored during session;Repositioned  General Information  Date of Onset 01/17/19  HPI [name] is a 85 y.o. female with a history of coronary artery disease who was admitted from SNF with abrupt onset of confusion after a nap 2/2 L side CVA. On arrival, she was noted to have right-sided weakness and aphasia, was then treated with IV TPA. Head CT revealed advanced chronic small vessel ischemic disease with chronic left thalamic lacunar infarct.   Type of Study MBS-Modified Barium Swallow Study  Previous Swallow Assessment BSE  Diet Prior to this Study NPO  Temperature Spikes Noted No  Respiratory Status Room air  History of Recent Intubation No  Behavior/Cognition Alert;Confused;Doesn't follow directions;Impulsive;Distractible;Cooperative  Oral Care Completed by SLP No  Oral Cavity - Dentition Adequate natural dentition  Self-Feeding Abilities Total assist  Patient Positioning Postural control adequate for testing;Upright in chair  Baseline Vocal Quality Normal  Volitional Cough Cognitively unable to elicit  Volitional Swallow Unable to elicit  Anatomy [facility]  Pharyngeal Secretions Not observed secondary MBS  Oral Assessment (Complete on admission/transfer/change in patient condition)  Does patient have any of the following "high(er) risk" factors? Nutritional status - fluids only or NPO for >24 hours  Patient is HIGH RISK: Non-ventilated Order set for Adult Oral Care Protocol initiated - "High Risk Patients - Non-Ventilated" option selected  (see row information)  Oral Motor/Sensory Function  Overall Oral Motor/Sensory Function Mild impairment  Facial ROM Reduced right;Suspected CN VII (facial) dysfunction  Facial Symmetry Abnormal symmetry  right;Suspected CN VII (facial) dysfunction  Facial Strength Reduced right;Suspected CN VII (facial) dysfunction  Mandible WFL  Oral Preparation/Oral Phase  Oral Phase Impaired  Oral - Pudding  Oral - Pudding Teaspoon NT  Oral - Honey  Oral - Honey Cup Weak lingual manipulation;Lingual pumping;Reduced posterior propulsion;Piecemeal swallowing;Delayed oral transit  Oral - Nectar  Oral - Nectar Cup Weak lingual manipulation;Lingual pumping;Reduced posterior propulsion;Piecemeal swallowing;Delayed oral transit;Right anterior bolus loss;Lingual/palatal residue  Oral - Thin  Oral - Thin Cup Weak lingual manipulation;Right anterior bolus loss;Lingual pumping;Reduced posterior propulsion;Delayed oral transit;Lingual/palatal residue  Oral - Solids  Oral - Puree Weak lingual manipulation;Lingual pumping;Reduced posterior propulsion;Lingual/palatal residue;Delayed oral transit  Pharyngeal Phase  Pharyngeal Phase Impaired  Pharyngeal - Pudding  Pharyngeal- Pudding Teaspoon NT (Delayed epiglottic inversion)  Pharyngeal- Pudding Cup NT  Pharyngeal Material enters airway, remains ABOVE vocal cords and not ejected out  Pharyngeal - Honey  Pharyngeal- Honey Cup Reduced airway/laryngeal closure;Pharyngeal residue - valleculae;Pharyngeal residue - pyriform  Pharyngeal - Nectar  Pharyngeal- Nectar Cup Pharyngeal residue - valleculae;Pharyngeal residue - pyriform;Penetration/Aspiration during swallow  Pharyngeal Material enters airway, passes BELOW cords without attempt by patient to eject out (silent aspiration)  Pharyngeal - Thin  Pharyngeal- Thin Cup Penetration/Aspiration during swallow;Pharyngeal residue - pyriform;Pharyngeal residue - valleculae;Penetration/Aspiration before swallow (Delayed epiglottic inversion)  Pharyngeal Material enters airway, remains ABOVE vocal cords then ejected out;Material enters airway, CONTACTS cords and not ejected out;Material enters airway, passes BELOW cords without  attempt by patient to eject out (silent aspiration)  Pharyngeal - Solids  Pharyngeal- Puree Reduced airway/laryngeal closure;Pharyngeal residue - valleculae;Pharyngeal residue - pyriform;Pharyngeal residue - posterior pharnyx;Penetration/Aspiration during swallow (Delayed epiglottic inversion)  Pharyngeal Material enters airway, remains ABOVE vocal cords and not ejected out (high penetration)  Cervical Esophageal Phase  Cervical Esophageal Phase Impaired (puree slower to transit)  Clinical Impression  Clinical Impression Pt exhibited moderate oropharyngeal dysphagia with aspiration of thin and consistent laryngeal penetration of nectar barium. Lingual movements were discoordinated, weak and inefficient to transit bolus resulting in lingual pumping, head extension and residue. Timing of epiglottic deflection was late with penetration of thin prior to and during the swallow contacting vocal cords and eventual aspiration during the swallow without sensation. Nectar thick and one puree trial entered laryngeal vestibule as well and epiglottic observed to incompletely invert. Throughout study, residue present in vallculae pyrirom sinuses with volume increasing as consistency became thicker. Spontaneous and verbal cues to swallow second time or cough were ineffective strategies. Presently, po intake is not recommended and nutrition via alternate means recommended. Prognosis for return to at least a modified diet/liquids is fair-good. ST will continue intervention as pt able moving toward po's.      SLP Visit Diagnosis Dysphagia, oropharyngeal phase (R13.12)  Impact on safety and function Moderate aspiration risk  Swallow Evaluation Recommendations  SLP Diet Recommendations NPO except meds  Medication Administration Via alternative means  Treatment Plan  Oral Care Recommendations Oral care QID  Other Recommendations Have oral suction available  Treatment Recommendations Therapy as outlined in treatment  plan below  Follow up Recommendations Skilled Nursing facility  Speech Therapy Frequency (ACUTE ONLY) min 2x/week  Treatment Duration 2 weeks  Interventions Diet toleration management by SLP;Trials of upgraded texture/liquids  Prognosis  Prognosis for Safe Diet Advancement Fair  Barriers to Reach Goals Cognitive deficits;Language deficits  Individuals Consulted  Consulted and Agree with Results and Recommendations RN;Patient unable/family or caregiver not available  Progression Toward Goals  Potential to Achieve Goals (ACUTE ONLY) Fair  Potential Considerations (ACUTE ONLY) Severity of impairments  SLP Time Calculation  SLP Start Time (ACUTE ONLY) 1225  SLP Stop Time (ACUTE ONLY) 1241  SLP Time Calculation (min) (ACUTE ONLY) 16 min  SLP Evaluations  $ SLP Speech Visit 1 Visit  SLP Evaluations  $ SLP EVAL LANGUAGE/SOUND PRODUCTION 1 Procedure    Orbie Pyo Arkeem Harts M.Ed Risk analyst [phone] Office [phone]

## 2019-01-18 NOTE — Evaluation (Signed)
Physical Therapy Evaluation Patient Details Name: [name] MRN: [id number] DOB: Mar 08, 1931 Today's Date: 01/18/2019   History of Present Illness  85 y.o. female with a history of coronary artery disease who was admitted from SNF with abrupt onset of confusion after a nap 2/2 L side CVA. On arrival, she was noted to have right-sided weakness and aphasia, was then treated with IV TPA. Head CT revealed advanced chronic small vessel ischemic disease with chronic left thalamic lacunar infarct.   Clinical Impression  Pt admitted with above. Pt able to express basic needs I.e. need to use bathroom, following some motor commands with gestural prompting. Otherwise, presents with global aphasia and perseverating at times. Requiring two person moderate assist for stand pivot transfer. Displays decreased cognition, balance, weakness, generalized pain (baseline). Recommending SNF at discharge.     Follow Up Recommendations SNF    Equipment Recommendations  defer   Recommendations for Other Services       Precautions / Restrictions Precautions Precautions: Fall Restrictions Weight Bearing Restrictions: No      Mobility  Bed Mobility Overal bed mobility: Needs Assistance Bed Mobility: Supine to Sit     Supine to sit: Mod assist     General bed mobility comments: requries (A) to elevate from surface adn question if [facility] / receptive aphasia increased (A) level requried  Transfers Overall transfer level: Needs assistance Equipment used: 2 person hand held assist Transfers: Stand Pivot Transfers;Sit to/from Stand Sit to Stand: +2 physical assistance;Mod assist Stand pivot transfers: +2 safety/equipment;+2 physical assistance;Mod assist       General transfer comment: required hand held placement for safety   Ambulation/Gait                Stairs            Wheelchair Mobility    Modified Rankin (Stroke Patients Only) Modified Rankin (Stroke Patients  Only) Pre-Morbid Rankin Score: Moderately severe disability Modified Rankin: Severe disability     Balance Overall balance assessment: Needs assistance                                           Pertinent Vitals/Pain Pain Assessment: Faces Faces Pain Scale: Hurts little more Pain Location: generalized Pain Descriptors / Indicators: Grimacing Pain Intervention(s): Monitored during session    Home Living Family/patient expects to be discharged to:: Skilled nursing facility                 Additional Comments: pt was living in assisted living for one week prior to admission. Family removed patient from indep living at the start of covid and took her home with 24/ caregivers in the home. pt progressively declined and placed in assisted living a week ago. family [facility] been [facility] for SNF level care and pt is waitlisted. pt previously admission at SNF was 4 days due to patient unable to tolerate lack of access to family    Prior Function Level of Independence: Needs assistance      ADL's / Homemaking Assistance Needed: all aspects        Hand Dominance   Dominant Hand: Right    Extremity/Trunk Assessment   Upper Extremity Assessment Upper Extremity Assessment: Generalized weakness    Lower Extremity Assessment Lower Extremity Assessment: Generalized weakness    Cervical / Trunk Assessment Cervical / Trunk Assessment: Other exceptions(scoliosis)  Communication  Communication: HOH  Cognition Arousal/Alertness: Awake/alert Behavior During Therapy: Restless Overall Cognitive Status: Impaired/Different from baseline Area of Impairment: Safety/judgement;Following commands;Awareness                       Following Commands: Follows one step commands with increased time;Follows one step commands inconsistently Safety/Judgement: Decreased awareness of safety;Decreased awareness of deficits Awareness: Intellectual   General  Comments: pt with global aphasia and needs visual cues to intiate at times      General Comments General comments (skin integrity, edema, etc.): family reports pt falls backward at baseline    Exercises     Assessment/Plan    PT Assessment Patient needs continued PT services  PT Problem List Decreased strength;Decreased balance;Decreased activity tolerance;Pain;Decreased safety awareness;Decreased cognition;Decreased coordination;Decreased mobility       PT Treatment Interventions DME instruction;Gait training;Functional mobility training;Therapeutic activities;Therapeutic exercise;Balance training;Patient/family education    PT Goals (Current goals can be found in the Care Plan section)  Acute Rehab PT Goals Patient Stated Goal: none stated but expressed need to use bathroom PT Goal Formulation: With patient/family Time For Goal Achievement: 02/01/19 Potential to Achieve Goals: Good    Frequency Min 3X/week   Barriers to discharge        Co-evaluation PT/OT/SLP Co-Evaluation/Treatment: Yes Reason for Co-Treatment: Complexity of the patient's impairments (multi-system involvement);Necessary to address cognition/behavior during functional activity;For patient/therapist safety;To address functional/ADL transfers PT goals addressed during session: Mobility/safety with mobility OT goals addressed during session: ADL's and self-care;Proper use of Adaptive equipment and DME;Strengthening/ROM       AM-PAC PT "6 Clicks" Mobility  Outcome Measure Help needed turning from your back to your side while in a flat bed without using bedrails?: A Lot Help needed moving from lying on your back to sitting on the side of a flat bed without using bedrails?: A Lot Help needed moving to and from a bed to a chair (including a wheelchair)?: A Lot Help needed standing up from a chair using your arms (e.g., wheelchair or bedside chair)?: A Lot Help needed to walk in hospital room?: Total Help  needed climbing 3-5 steps with a railing? : Total 6 Click Score: 10    End of Session Equipment Utilized During Treatment: Gait belt Activity Tolerance: Patient tolerated treatment well Patient left: in chair;with call bell/phone within reach;with chair alarm set;with family/visitor present Nurse Communication: Mobility status PT Visit Diagnosis: Other abnormalities of gait and mobility (R26.89);Muscle weakness (generalized) (M62.81);Difficulty in walking, not elsewhere classified (R26.2);Other symptoms and signs involving the nervous system (R29.898)    Time: [id number] PT Time Calculation (min) (ACUTE ONLY): 28 min   Charges:   PT Evaluation $PT Eval Moderate Complexity: 1 Mod          Ellamae Sia, [place], DPT Acute Rehabilitation Services Pager [phone] Office [phone]   Willy Eddy 01/18/2019, 5:35 PM

## 2019-01-18 NOTE — Evaluation (Addendum)
Speech Language Pathology Evaluation Patient Details Name: [name] MRN: [id number] DOB: March 25, 1931 Today's Date: 01/18/2019 Time: [id number] SLP Time Calculation (min) (ACUTE ONLY): 16 min  Problem List:  Patient Active Problem List   Diagnosis Date Noted  . Stroke (cerebrum) ([place]) 01/17/2019  . PAD (peripheral artery disease) ([place]) 09/27/2018  . Bilateral carotid bruits 07/27/2018  . Pain in limb-Left Knee 04/04/2013  . Peripheral vascular disease, unspecified ([place]) 04/04/2013  . Choledocholithiasis with obstruction 10/30/2011  . PVD (peripheral vascular disease) ([place]) 10/29/2011  . Pancreatitis 10/28/2011  . Cholelithiasis 10/28/2011  . UTI (urinary tract infection) 10/28/2011  . Hypertension 10/28/2011  . Hypercholesterolemia 10/28/2011   Past Medical History:  Past Medical History:  Diagnosis Date  . Adenomatous colon polyp 2007  . Arthritis   . Bilateral carotid bruits 07/27/2018  . CAD (coronary artery disease)   . Chronic back pain   . Gall stone pancreatitis   . Heart murmur   . High cholesterol   . Hx: UTI (urinary tract infection)    Now infrequent  . Hypertension   . Left knee pain Nov. 18, 2014  . Onychomycosis   . Osteoarthritis   . Osteopenia   . PAD (peripheral artery disease) ([place])   . PVD (peripheral vascular disease) ([place])   . Skin ulcer of left heel ([place])   . Trigger finger, right   . Vertigo    Past Surgical History:  Past Surgical History:  Procedure Laterality Date  . ABDOMINAL HYSTERECTOMY     partial  . CHOLECYSTECTOMY  10/30/2011   Procedure: LAPAROSCOPIC CHOLECYSTECTOMY WITH INTRAOPERATIVE CHOLANGIOGRAM;  Surgeon: Zenovia Jarred, MD;  Location: [facility];  Service: General;  Laterality: N/A;  . ENDARTERECTOMY FEMORAL Left 09/27/2018   Procedure: left Endarterectomy Femoral;  Surgeon: Angelia Mould, MD;  Location: [facility];  Service: Vascular;  Laterality: Left;  . EUS  11/11/2011   Procedure: ESOPHAGEAL ENDOSCOPIC ULTRASOUND  (EUS) RADIAL;  Surgeon: Arta Silence, MD;  Location: [facility];  Service: Endoscopy;  Laterality: N/A;  possible ERCP  . FEMORAL-POPLITEAL BYPASS GRAFT Left 09/27/2018   Procedure: left FEMORAL THROMBECTOMY;  Surgeon: Angelia Mould, MD;  Location: [facility];  Service: Vascular;  Laterality: Left;  . JOINT REPLACEMENT Left 06-22-06   Hip  . JOINT REPLACEMENT Right 11-26-06   Shoulder  . JOINT REPLACEMENT Right 02-27-06   Knee  . LOWER EXTREMITY ANGIOGRAPHY N/A 09/27/2018   Procedure: LOWER EXTREMITY ANGIOGRAPHY;  Surgeon: Nigel Mormon, MD;  Location: [facility];  Service: Cardiovascular;  Laterality: N/A;  . PATCH ANGIOPLASTY Left 09/27/2018   Procedure: Patch Angioplasty of left femoral artery using xenosure bovine pericardium patch;  Surgeon: Angelia Mould, MD;  Location: [facility];  Service: Vascular;  Laterality: Left;  . PERIPHERAL VASCULAR INTERVENTION  09/27/2018   Procedure: PERIPHERAL VASCULAR INTERVENTION;  Surgeon: Nigel Mormon, MD;  Location: [facility];  Service: Cardiovascular;;  . TOTAL HIP ARTHROPLASTY    . TOTAL KNEE ARTHROPLASTY    . TOTAL SHOULDER ARTHROPLASTY     HPI:  JALYAH HESSLER is a 85 y.o. female with a history of coronary artery disease who was admitted from SNF with abrupt onset of confusion after a nap 2/2 L side CVA. On arrival, she was noted to have right-sided weakness and aphasia, was then treated with IV TPA. Head CT revealed advanced chronic small vessel ischemic disease with chronic left thalamic lacunar infarct.    Assessment / Plan / Recommendation Clinical  Impression  Pt exhibits global aphasia, cognitive impairments in addition to baseline hearing difficulty compounding differentiation of comprehension. She was restless appeared in pain and somewhat anxious throughout. Per RN pt has chronic back pain and joint replacemments. She was unable to follow simple one step commands from verbal presentation and written.  Therapist suspicious of visual disturbance as well. Verbally, she produced spontaneous phrases with a mixture of appropriate words and nelogisms. Discernable words were clear and intelligible. Pt stated first name with mild distortion and no response to last. She held the pen but no response to name it. SLP will continue further diagnostic treatment to determine apraxia and motor planning during  Verbalizations. She would benefit from continued ST to facilitate basic communicative-cognitive intent.       SLP Assessment  SLP Recommendation/Assessment: Patient needs continued Speech Lanaguage Pathology Services SLP Visit Diagnosis: Cognitive communication deficit (R41.841)    Follow Up Recommendations  Skilled Nursing facility    Frequency and Duration min 2x/week  2 weeks      SLP Evaluation Cognition  Overall Cognitive Status: Impaired/Different from baseline Arousal/Alertness: Awake/alert Orientation Level: (inappropriate response to y/n) Attention: Sustained Sustained Attention: Impaired Sustained Attention Impairment: Verbal basic;Functional basic Memory: (TBA) Awareness: Impaired Awareness Impairment: Intellectual impairment;Emergent impairment Problem Solving: Impaired Problem Solving Impairment: Functional basic Behaviors: Restless(appears anxious) Safety/Judgment: Impaired       Comprehension  Auditory Comprehension Overall Auditory Comprehension: Impaired Yes/No Questions: (inappr response) Commands: Impaired One Step Basic Commands: (pt did not attempt to flolow) Interfering Components: Attention;Anxiety;Pain Visual Recognition/Discrimination Discrimination: Not tested Reading Comprehension Reading Status: Impaired Word level: Impaired Sentence Level: Impaired Interfering Components: (vision?)    Expression Expression Primary Mode of Expression: Verbal Verbal Expression Overall Verbal Expression: Impaired Initiation: No impairment Level of  Generative/Spontaneous Verbalization: Phrase Repetition: Impaired Level of Impairment: Word level Naming: (no response) Pragmatics: Impairment Impairments: Eye contact Interfering Components: Attention Written Expression Dominant Hand: Right Written Expression: (TBA)   Oral / Motor  Oral Motor/Sensory Function Overall Oral Motor/Sensory Function: Mild impairment Facial ROM: Reduced right;Suspected CN VII (facial) dysfunction Facial Symmetry: Abnormal symmetry right;Suspected CN VII (facial) dysfunction Facial Strength: Reduced right;Suspected CN VII (facial) dysfunction Mandible: Within Functional Limits Motor Speech Overall Motor Speech: Impaired Respiration: Within functional limits Phonation: Normal Resonance: Within functional limits Articulation: Within functional limitis Intelligibility: Intelligible(with words are accurate) Motor Planning: (will continue to assess)   GO                    Mick Sell Orbie Pyo 01/18/2019, 1:17 PM   Orbie Pyo Colvin Caroli.Ed Risk analyst [phone] Office [phone]

## 2019-01-19 ENCOUNTER — Inpatient Hospital Stay (HOSPITAL_COMMUNITY): Payer: Medicare Other

## 2019-01-19 LAB — URINALYSIS, ROUTINE W REFLEX MICROSCOPIC
Bilirubin Urine: NEGATIVE
Glucose, UA: NEGATIVE mg/dL
Ketones, ur: 5 mg/dL — AB
Nitrite: NEGATIVE
Protein, ur: 30 mg/dL — AB
Specific Gravity, Urine: 1.024 (ref 1.005–1.030)
pH: 7 (ref 5.0–8.0)

## 2019-01-19 LAB — BASIC METABOLIC PANEL
Anion gap: 10 (ref 5–15)
BUN: 11 mg/dL (ref 8–23)
CO2: 24 mmol/L (ref 22–32)
Calcium: 8.7 mg/dL — ABNORMAL LOW (ref 8.9–10.3)
Chloride: 105 mmol/L (ref 98–111)
Creatinine, Ser: 0.74 mg/dL (ref 0.44–1.00)
GFR calc Af Amer: >60 mL/min (ref >60)
GFR calc non Af Amer: >60 mL/min (ref >60)
Glucose, Bld: 90 mg/dL (ref 70–99)
Potassium: 3.9 mmol/L (ref 3.5–5.1)
Sodium: 139 mmol/L (ref 135–145)

## 2019-01-19 MED ORDER — RESOURCE THICKENUP CLEAR PO POWD
ORAL | Status: DC | PRN
  Filled 2019-01-19 (×2): qty 125

## 2019-01-19 MED ORDER — SODIUM CHLORIDE 0.9 % IV SOLN
1.0000 g | INTRAVENOUS | Status: DC
  Administered 2019-01-19 – 2019-01-20 (×2): 1 g via INTRAVENOUS
  Filled 2019-01-19: qty 1
  Filled 2019-01-19: qty 10

## 2019-01-19 MED ORDER — ASPIRIN 325 MG PO TABS
325.0000 mg | ORAL_TABLET | Freq: Every day | ORAL | Status: DC
  Administered 2019-01-19 – 2019-01-20 (×2): 325 mg via ORAL
  Filled 2019-01-19 (×2): qty 1

## 2019-01-19 NOTE — Progress Notes (Addendum)
  Speech Language Pathology Treatment: Dysphagia  Patient Details Name: [name] MRN: [id number] DOB: 1931/01/07 Today's Date: 01/19/2019 Time: [id number] SLP Time Calculation (min) (ACUTE ONLY): 16 min  Assessment / Plan / Recommendation Clinical Impression  Pts daughter was present during session and confirmed that her current level of functioning/ cognition is impaired compared to baseline, although a large improvement from yesterday. Pt was able to communicate her needs and wants compared to acute confusion, inattention, and largely unintelligible speech prior. Oral care was completed with max assist due to dryness of mouth. Thin liquids with small sip from cup consistently resulted in multiple coughs. With puree pt showed no s/sx of aspiration until she tried to speak before swallowing which resulted in an immediate throat clear. Pt was educated on waiting to speak while eating, followed these directions, and had no s/sx of aspiration in the following trial. Due to pts significant cognitive and awareness improvements demonstrated today, MBS has been ordered to confirm safety of swallow in order to start her back on POs.     HPI HPI: [name] is a 85 y.o. female with a history of coronary artery disease who was admitted from SNF with abrupt onset of confusion after a nap 2/2 L side CVA. On arrival, she was noted to have right-sided weakness and aphasia, was then treated with IV TPA. Head CT revealed advanced chronic small vessel ischemic disease with chronic left thalamic lacunar infarct.       SLP Plan  MBS       Recommendations  Diet recommendations: NPO;Other(comment)(MBS later today to determine)                Oral Care Recommendations: Oral care QID Follow up Recommendations: Skilled Nursing facility SLP Visit Diagnosis: Dysphagia, oropharyngeal phase (R13.12) Plan: MBS                       Thanh Pomerleau 01/19/2019, 12:20 PM

## 2019-01-19 NOTE — Progress Notes (Signed)
STROKE TEAM PROGRESS NOTE   INTERVAL HISTORY Patient is neurologically much improved today.  She is extremely hard of hearing.  She is able to communicate and speak much better though she still slightly confused and disoriented MRI scan of the brain does not show any definite infarct.  EEG was ordered but results are pending.  Blood pressure adequately controlled  Vitals:   01/19/19 0400 01/19/19 0500 01/19/19 0600 01/19/19 0700  BP: 138/62 120/69 (!) 154/63 (!) 155/54  Pulse: 60 66 61 66  Resp: 13 19 18 18   Temp: 98.1 F (36.7 C)     TempSrc: Oral     SpO2: 100% 99% 100% 100%  Weight:        CBC:  Recent Labs  Lab 01/17/19 1647 01/17/19 1714 01/18/19 1036  WBC 7.2  --  9.8  NEUTROABS 4.3  --   --   HGB 12.0 12.9 10.7*  HCT 38.4 38.0 33.0*  MCV 99.0  --  97.9  PLT 217  --  0000000    Basic Metabolic Panel:  Recent Labs  Lab 01/17/19 1647 01/17/19 1714 01/19/19 0132  NA 140 140 139  K 3.4* 3.2* 3.9  CL 104 102 105  CO2 26  --  24  GLUCOSE 100* 93 90  BUN 11 13 11   CREATININE 0.83 0.80 0.74  CALCIUM 9.2  --  8.7*   Lipid Panel:     Component Value Date/Time   CHOL 114 01/18/2019 1036   TRIG 37 01/18/2019 1036   HDL 46 01/18/2019 1036   CHOLHDL 2.5 01/18/2019 1036   VLDL 7 01/18/2019 1036   LDLCALC 61 01/18/2019 1036   HgbA1c:  Lab Results  Component Value Date   HGBA1C 5.4 01/18/2019   IMAGING Ct Head Code Stroke Wo Contrast 01/17/2019 No acute intracranial hemorrhage or evidence of acute demarcated cortical infarction. ASPECTS 10 Redemonstrated advanced chronic small vessel ischemic disease with chronic left thalamic lacunar infarct. Moderate generalized parenchymal atrophy.   Ct Angio Head W Or Wo Contrast 01/17/2019 No intracranial large vessel occlusion. Calcified plaque within the intracranial carotid artery siphons. Resultant mild-to-moderate narrowing of the paraclinoid left internal carotid artery. Additional sites of mild luminal narrowing within the  intracranial carotid artery siphons bilaterally. Atherosclerotic irregularity of the basilar artery with a focal mild to moderate stenosis within the mid basilar artery.   Ct Angio Neck W Or Wo Contrast 01/17/2019 Atherosclerotic disease within the visualized aortic arch and major branch vessels as described. The bilateral common carotid, internal carotid and vertebral arteries are patent within the neck. Plaque at the carotid bifurcations results in less than 50% narrowing of the proximal cervical ICAs bilaterally. Prominent calcified plaque at the origin of the left vertebral artery with suspected at least moderate ostial narrowing.   Mr Brain Wo Contrast 01/18/2019 Motion degraded study Negative for acute infarct. Atrophy and moderate to extensive chronic small vessel ischemia.   2D Echocardiogram  1. The left ventricle has hyperdynamic systolic function, with an ejection fraction of >65%. The cavity size was normal. Left ventricular diastolic function could not be evaluated due to nondiagnostic images. Elevated left ventricular end-diastolic  pressure.  2. The right ventricle has normal systolic function. The cavity was normal. There is no increase in right ventricular wall thickness. Right ventricular systolic pressure is mildly elevated.  3. Right atrial size was severely dilated.  4. The aortic valve was not well visualized. Severely thickening of the aortic valve. Severe calcifcation of the aortic valve. Aortic  valve regurgitation is moderate by color flow Doppler. Mild stenosis of the aortic valve. Severe aortic annular  calcification noted.  5. The mitral valve is degenerative. Moderate thickening of the mitral valve leaflet. Mild calcification of the mitral valve leaflet. There is mild to moderate mitral annular calcification present.  6. The aorta is normal unless otherwise noted.  7. The inferior vena cava was dilated in size with <50% respiratory variability.  EEG EEG showed  continuous generalized 2 to 5 Hz theta delta slowing. Hyperventilation and photic stimulation were not performed. EEG was technically difficult due to significant mechanical artifact. This study is suggestive of moderate diffuse encephalopathy, nonspecific to etiology. No seizures or epileptiform discharges were seen throughout the recording.   PHYSICAL EXAM   Pleasant elderly Caucasian lady was not in distress.    . Afebrile. Head is nontraumatic. Neck is supple without bruit.    Cardiac exam no murmur or gallop. Lungs are clear to auscultation. Distal pulses are well felt. Neurological Exam :  She is awake alert.  She is extremely hard of hearing.  She follows commands well.  She is slightly disoriented but can follow 1 and two-step commands.  She has diminished attention, registration and recall.  Extraocular movements are full range without nystagmus.  There is no facial weakness.  Tongue midline.  Motor system exam able to move all 4 extremities purposefully against gravity.  No focal weakness.  Deep tendon flexes symmetric plantars downgoing.  Gait not tested   ASSESSMENT/PLAN [name] is a 85 y.o. female with history of CAD presenting with sudden onset confusion after nap with R sided weakness ands aphasia. Received tPA 01/17/2019 at 1705.   Stroke-like symptoms s/p tPA:  L brain TIA  Code Stroke CT head No acute abnormality. Old L thalamic infarct. Small vessel disease. Atrophy. ASPECTS 10.     CTA head no ELVO. Scattered plaque. Mod BA stenosis.  CTA neck < 50% ICA narrowing. Mod L VA stenosis.  MRI  No acute stroke  2D Echo EF >65%. RA severely dilated. No source of embolus   EEG diffused encephalopathy. No sz.   LDL 61  HgbA1c 5.4   SCDs for VTE prophylaxis  aspirin 81 mg daily and clopidogrel 75 mg daily prior to admission, now on aspirin 300 mg suppository daily as unable to swallow. Plan resume aspirin DAPT once can swallow.    Therapy recommendations:   SNF  Disposition:  pending   DNR  Await transfer to the floor. VS/[place] q 4h  Possible atrial fibrillation   Few runs of rapid HR in ED not captured on tele  Continue tele monitoring to look for AF  Hypertension  Home meds:  Amlodipine 5, hydralazine 25 if BP > 150, losartan 100  Requiring cardene for BP control post tPA BP goal per post tPA protocol x 24h following tPA administration . Long-term BP goal normotensive  Dysphagia . Secondary to stroke . NPO . Speech on board  Other Stroke Risk Factors  Advanced age  Coronary artery disease  PAD  PVD  UTI  Febrile. Temp 100.8   UA: 5 ketones, large LE, many bacteria, 6-10 WBC  Rocephin 1 gm IV daily x 5 days->keflex 500 bid once can swallow  UCx pending   Other Active Problems  Hypokalemia, resolved 3.4->3.2 replaced w/ 2 runs, for addition 7meq - 3.9  Hospital day # 2 Patient presented with strokelike episode and received IV TPA and seems to have recovered quite well  but MRI shows no definite stroke.  She does have a urinary tract infection for which she will be started on antibiotics and low potassium which was replaced recommend check EEG for seizure activity.  Mobilize out of bed.  Therapy consults.  Transfer to neurology floor bed later today and likely discharge home in the next couple of days..  No family available at the bedside for discussion.  Greater than 50% time during this 35-minute visit was spent on counseling and coordination of care about her episode of confusion and strokelike presentation and treatment and evaluation plan  Antony Contras, MD Medical Director [place] Pager: [phone] 01/19/2019 8:28 AM   To contact Stroke Continuity provider, please refer to [email or web address] After hours, contact General Neurology

## 2019-01-19 NOTE — Progress Notes (Addendum)
Physical Therapy Treatment Patient Details Name: [name] MRN: [id number] DOB: [birth year] Today's Date: 01/19/2019    History of Present Illness 85 y.o. female with a history of coronary artery disease who was admitted from SNF with abrupt onset of confusion after a nap 2/2 L side CVA. On arrival, she was noted to have right-sided weakness and aphasia, was then treated with IV TPA. Head CT revealed advanced chronic small vessel ischemic disease with chronic left thalamic lacunar infarct.     PT Comments    Pt with notable improvements today in communication and cognition; still not at baseline. Following some simple commands with multimodal cueing and repetition. Continues to require + 2 assist for transfers and ambulation. Ambulating 10 feet with walker to and from bathroom. Due to pt level of assist required with mobility in combination with decreased safety awareness/attention/problem solving abilities, continue to recommend SNF at discharge prior to returning to assisted living facility. Pt daughter in agreement.    Follow Up Recommendations  SNF     Equipment Recommendations  Other (comment)(defer)    Recommendations for Other Services       Precautions / Restrictions Precautions Precautions: Fall Restrictions Weight Bearing Restrictions: No    Mobility  Bed Mobility Overal bed mobility: Needs Assistance Bed Mobility: Supine to Sit     Supine to sit: Mod assist     General bed mobility comments: modA to initiate upright, increased time and effort  Transfers Overall transfer level: Needs assistance Equipment used: Rolling walker (2 wheeled) Transfers: Sit to/from Stand Sit to Stand: +2 physical assistance;Mod assist Stand pivot transfers: +2 safety/equipment;+2 physical assistance;Mod assist       General transfer comment: Preferring to pull up on walker, modA + 2 to boost up to stand from edge of bed and toilet  Ambulation/Gait Ambulation/Gait  assistance: Mod assist;+2 safety/equipment Gait Distance (Feet): 10 Feet Assistive device: Rolling walker (2 wheeled) Gait Pattern/deviations: Step-through pattern;Decreased stride length;Shuffle;Trunk flexed;Narrow base of support Gait velocity: decreased Gait velocity interpretation: <1.8 ft/sec, indicate of risk for recurrent falls General Gait Details: Multimiodal, max cues for motor initiation, larger steps, walker proximity   Stairs             Wheelchair Mobility    Modified Rankin (Stroke Patients Only)       Balance Overall balance assessment: Needs assistance Sitting-balance support: Feet supported Sitting balance-Leahy Scale: Fair Sitting balance - Comments: When putting glasses on pt gets distracted with task at hand and loses balance posteriorly. Postural control: Posterior lean Standing balance support: Bilateral upper extremity supported;During functional activity Standing balance-Leahy Scale: Poor Standing balance comment: Pt with posterior lean when standing and constant cues given to not push walker too far away from body.                            Cognition Arousal/Alertness: Awake/alert Behavior During Therapy: WFL for tasks assessed/performed Overall Cognitive Status: Impaired/Different from baseline Area of Impairment: Attention;Following commands;Safety/judgement;Awareness;Problem solving                   Current Attention Level: Sustained   Following Commands: Follows one step commands with increased time;Follows one step commands inconsistently Safety/Judgement: Decreased awareness of safety;Decreased awareness of deficits Awareness: Intellectual Problem Solving: Slow processing;Requires verbal cues General Comments: pt with global aphasia and needs visual cues to intiate at times      Exercises      General Comments General comments (  skin integrity, edema, etc.): Pt continues to require a great amount of assist with  adls and is not close to her baseline mobility. Pt is a fall risk. Continue to feel she needs SNF placement before returning to her assisted living as she needs 24./7 assist.      Pertinent Vitals/Pain Pain Assessment: Faces Faces Pain Scale: Hurts a little bit Pain Location: generalized Pain Descriptors / Indicators: Grimacing Pain Intervention(s): Monitored during session    Home Living                      Prior Function            PT Goals (current goals can now be found in the care plan section) Acute Rehab PT Goals Patient Stated Goal: to get better Potential to Achieve Goals: Good Progress towards PT goals: Progressing toward goals    Frequency    Min 3X/week      PT Plan Current plan remains appropriate    Co-evaluation PT/OT/SLP Co-Evaluation/Treatment: Yes Reason for Co-Treatment: Complexity of the patient's impairments (multi-system involvement);Necessary to address cognition/behavior during functional activity;For patient/therapist safety;To address functional/ADL transfers PT goals addressed during session: Mobility/safety with mobility OT goals addressed during session: ADL's and self-care      AM-PAC PT "6 Clicks" Mobility   Outcome Measure  Help needed turning from your back to your side while in a flat bed without using bedrails?: A Little Help needed moving from lying on your back to sitting on the side of a flat bed without using bedrails?: A Lot Help needed moving to and from a bed to a chair (including a wheelchair)?: A Lot Help needed standing up from a chair using your arms (e.g., wheelchair or bedside chair)?: A Lot Help needed to walk in hospital room?: A Lot Help needed climbing 3-5 steps with a railing? : Total 6 Click Score: 12    End of Session Equipment Utilized During Treatment: Gait belt Activity Tolerance: Patient tolerated treatment well Patient left: in chair;with call bell/phone within reach;with chair alarm set;with  family/visitor present Nurse Communication: Mobility status PT Visit Diagnosis: Other abnormalities of gait and mobility (R26.89);Muscle weakness (generalized) (M62.81);Difficulty in walking, not elsewhere classified (R26.2);Other symptoms and signs involving the nervous system [id number])     Time: [id number] PT Time Calculation (min) (ACUTE ONLY): 37 min  Charges:  $Gait Training: 8-22 mins                     Ellamae Sia, [place], DPT Acute Rehabilitation Services Pager [phone] Office [phone]    Willy Eddy 01/19/2019, 2:53 PM

## 2019-01-19 NOTE — Progress Notes (Signed)
Occupational Therapy Treatment Patient Details Name: [name] MRN: [id number] DOB: [birth year] Today's Date: 01/19/2019    History of present illness 85 y.o. female with a history of coronary artery disease who was admitted from SNF with abrupt onset of confusion after a nap 2/2 L side CVA. On arrival, she was noted to have right-sided weakness and aphasia, was then treated with IV TPA. Head CT revealed advanced chronic small vessel ischemic disease with chronic left thalamic lacunar infarct.    OT comments  Pt making very slow improvements.  Pt is more alert and awake today.  Pt did walk to bathroom with +2 assist but is a big fall risk.  Pt with posterior lean and needs someone with her 24/7.  Pt not following all commands, has problem solving and attn deficits that will preclude her from using a call bell in assisted living when needed. Pt has recently forgotten how to answer a phone as well.  Feel SNF rehab is best option before returning to assisted living.   Follow Up Recommendations  SNF    Equipment Recommendations  3 in 1 bedside commode;Wheelchair cushion (measurements OT);Wheelchair (measurements OT)    Recommendations for Other Services      Precautions / Restrictions Precautions Precautions: Fall Restrictions Weight Bearing Restrictions: No       Mobility Bed Mobility Overal bed mobility: Needs Assistance Bed Mobility: Supine to Sit     Supine to sit: Mod assist     General bed mobility comments: requries (A) to elevate from surface adn question if [facility] / receptive aphasia increased (A) level requried  Transfers Overall transfer level: Needs assistance Equipment used: 2 person hand held assist Transfers: Stand Pivot Transfers;Sit to/from Stand Sit to Stand: +2 physical assistance;Mod assist Stand pivot transfers: +2 safety/equipment;+2 physical assistance;Mod assist       General transfer comment: required hand held placement for safety     Balance  Overall balance assessment: Needs assistance Sitting-balance support: Feet supported Sitting balance-Leahy Scale: Fair Sitting balance - Comments: When putting glasses on pt gets distracted with task at hand and loses balance posteriorly. Postural control: Posterior lean Standing balance support: Bilateral upper extremity supported;During functional activity Standing balance-Leahy Scale: Poor Standing balance comment: Pt with posterior lean when standing and constant cues given to not push walker too far away from body.                           ADL either performed or assessed with clinical judgement   ADL Overall ADL's : Needs assistance/impaired Eating/Feeding: Minimal assistance;Sitting   Grooming: Minimal assistance;Sitting;Cueing for sequencing                   Toilet Transfer: Moderate assistance;+2 for physical assistance;+2 for safety/equipment Toilet Transfer Details (indicate cue type and reason): Pt walked to bathroom with mod A+2 and tranferred to toilet with +2 assist due to poor balance. Toileting- Clothing Manipulation and Hygiene: Maximal assistance;Sit to/from stand;Cueing for compensatory techniques Toileting - Clothing Manipulation Details (indicate cue type and reason): Pt unable to keep balance and clean self on the toilet.  Pt stood while therpist cleaned pt.  Pt balance poor during adls in standing.      Functional mobility during ADLs: Minimal assistance;+2 for physical assistance;Rolling walker General ADL Comments: Pt completes adls in sitting with extra time and min assist.  In standing, pt requires more assist due to poor balance in standing.  Pt with  a posterior lean that makes her a fall risk.     Vision   Vision Assessment?: No apparent visual deficits Additional Comments: pt wears glasses at all times.   Perception     Praxis      Cognition Arousal/Alertness: Awake/alert Behavior During Therapy: WFL for tasks  assessed/performed Overall Cognitive Status: Impaired/Different from baseline Area of Impairment: Attention;Following commands;Safety/judgement;Awareness;Problem solving                   Current Attention Level: Sustained   Following Commands: Follows one step commands with increased time;Follows one step commands inconsistently Safety/Judgement: Decreased awareness of safety;Decreased awareness of deficits Awareness: Intellectual Problem Solving: Slow processing;Requires verbal cues General Comments: pt with global aphasia and needs visual cues to intiate at times        Exercises     Shoulder Instructions       General Comments Pt continues to require a great amount of assist with adls and is not close to her baseline mobility. Pt is a fall risk. Continue to feel she needs SNF placement before returning to her assisted living as she needs 24./7 assist.    Pertinent Vitals/ Pain       Pain Assessment: Faces Faces Pain Scale: Hurts a little bit Pain Location: generalized Pain Descriptors / Indicators: Grimacing Pain Intervention(s): Limited activity within patient's tolerance;Monitored during session;Repositioned  Home Living                                          Prior Functioning/Environment              Frequency  Min 2X/week        Progress Toward Goals  OT Goals(current goals can now be found in the care plan section)  Progress towards OT goals: Progressing toward goals  Acute Rehab OT Goals Patient Stated Goal: to get better OT Goal Formulation: With patient Time For Goal Achievement: 02/01/19 Potential to Achieve Goals: Good ADL Goals Pt Will Perform Grooming: with min assist;sitting Pt Will Perform Upper Body Bathing: with min assist;sitting Pt Will Transfer to Toilet: with mod assist;stand pivot transfer;bedside commode Additional ADL Goal #1: pt  will complete bed mobility min (A0 as precursor  Plan Discharge plan  remains appropriate    Co-evaluation    PT/OT/SLP Co-Evaluation/Treatment: Yes Reason for Co-Treatment: To address functional/ADL transfers PT goals addressed during session: Mobility/safety with mobility OT goals addressed during session: ADL's and self-care      AM-PAC OT "6 Clicks" Daily Activity     Outcome Measure   Help from another person eating meals?: A Lot Help from another person taking care of personal grooming?: A Lot Help from another person toileting, which includes using toliet, bedpan, or urinal?: A Lot Help from another person bathing (including washing, rinsing, drying)?: A Lot Help from another person to put on and taking off regular upper body clothing?: A Lot Help from another person to put on and taking off regular lower body clothing?: A Lot 6 Click Score: 12    End of Session Equipment Utilized During Treatment: Gait belt;Rolling walker  OT Visit Diagnosis: Unsteadiness on feet (R26.81);Muscle weakness (generalized) (M62.81);Cognitive communication deficit (R41.841) Symptoms and signs involving cognitive functions: Cerebral infarction   Activity Tolerance Patient tolerated treatment well   Patient Left in chair;with call bell/phone within reach;with chair alarm set;with family/visitor present   Nurse Communication  Mobility status;Precautions        Time: [id number] OT Time Calculation (min): 38 min  Charges: OT General Charges $OT Visit: 1 Visit OT Treatments $Self Care/Home Management : 8-22 mins  Jinger Neighbors, OTR/L [id number]   Glenford Peers 01/19/2019, 12:15 PM

## 2019-01-19 NOTE — Procedures (Signed)
Patient Name: [name]  MRN: [id number]  Epilepsy Attending: Lora Havens  Referring Physician/Provider: Dr Antony Contras Date: 01/18/2019 Duration: 29.40 mins  Patient history: 85yo F with aphasia and right sided weakness. EEG to evaluate for seizyre  Level of alertness: awake, drowsy  AEDs during EEG study: None  Technical aspects: This EEG study was done with scalp electrodes positioned according to the 10-20 International system of electrode placement. Electrical activity was acquired at a sampling rate of 500Hz  and reviewed with a high frequency filter of 70Hz  and a low frequency filter of 1Hz . EEG data were recorded continuously and digitally stored.   DESCRIPTION: EEG showed continuous generalized 2 to 5 Hz theta delta slowing. Hyperventilation and photic stimulation were not performed. EEG was technically difficult due to significant mechanical artifact.  IMPRESSION: This study is suggestive of moderate diffuse encephalopathy, nonspecific to etiology. No seizures or epileptiform discharges were seen throughout the recording.  Asherah Lavoy Barbra Sarks

## 2019-01-19 NOTE — Progress Notes (Signed)
Modified Barium Swallow Progress Note  Patient Details  Name: [name] MRN: [id number] Date of Birth: Sep 19, 1930  Today's Date: 01/19/2019  Modified Barium Swallow completed.  Full report located under Chart Review in the Imaging Section.  Brief recommendations include the following:  Clinical Impression  Pt was joined by her daughter for this study and had no memory of completing the same test yesterday. Pt was alert and oriented today, able to follow commands and feed herself with min assist. Timing and coordination of swallow has improved since previous evaluation, although deficits with oral phase manipulation and ability to propel bolus posteriorly deficits remain. Pt continued to demonstrate a head extension. Nectar penetrated to vocal folds before swallow followed by an immediate cough response. Honey thick facilitaed oral cohesion and decreased rate through through phayrnx. Honey also resulted in flash penetration to the level of the vocal folds , clearance of bolus, followed by secondary penetration from residue in the pyrifom sinuses. She was aware and produced a small throat clear which was effective in preventing aspiration, and followed commands to throat clear intermittently. Pt produced secondary swallows which were effective in reducing valleculae and pyriform sinus residue.  Soft solids had a longer mastication time and extensive residue in pharynx.  Honey was used as a rinse with no penetration or aspiration. Recommend upgrade to Dys 2 and honey thick liquids with full supervision, meds crushed in puree with f/u for tx from SLP to assess pts tolerance on new diet. (P)    Swallow Evaluation Recommendations       SLP Diet Recommendations: Dysphagia 2 (Fine chop) solids;Honey thick liquids   Liquid Administration via: Cup   Medication Administration: Crushed with puree   Supervision: Staff to assist with self feeding;Full supervision/cueing for compensatory strategies   Compensations: Multiple dry swallows after each bite/sip;Clear throat intermittently;Effortful swallow   Postural Changes: Seated upright at 90 degrees   Oral Care Recommendations: Oral care BID   Other Recommendations: Order thickener from pharmacy    Houston Siren 01/19/2019,4:40 PM   Orbie Pyo Norris.Ed Risk analyst [phone] Office [phone]

## 2019-01-20 LAB — BASIC METABOLIC PANEL
Anion gap: 11 (ref 5–15)
BUN: 9 mg/dL (ref 8–23)
CO2: 28 mmol/L (ref 22–32)
Calcium: 8.9 mg/dL (ref 8.9–10.3)
Chloride: 100 mmol/L (ref 98–111)
Creatinine, Ser: 0.82 mg/dL (ref 0.44–1.00)
GFR calc Af Amer: >60 mL/min (ref >60)
GFR calc non Af Amer: >60 mL/min (ref >60)
Glucose, Bld: 107 mg/dL — ABNORMAL HIGH (ref 70–99)
Potassium: 3.4 mmol/L — ABNORMAL LOW (ref 3.5–5.1)
Sodium: 139 mmol/L (ref 135–145)

## 2019-01-20 MED ORDER — FAMOTIDINE 20 MG PO TABS
20.0000 mg | ORAL_TABLET | Freq: Every day | ORAL | Status: DC
  Administered 2019-01-20: 22:00:00 20 mg via ORAL
  Filled 2019-01-20: qty 1

## 2019-01-20 MED ORDER — ASPIRIN EC 81 MG PO TBEC: 81.0000 mg | DELAYED_RELEASE_TABLET | Freq: Every day | ORAL | Status: DC

## 2019-01-20 MED ORDER — CLOPIDOGREL BISULFATE 75 MG PO TABS
75.0000 mg | ORAL_TABLET | Freq: Every day | ORAL | Status: DC
  Administered 2019-01-20: 75 mg via ORAL
  Filled 2019-01-20: qty 1

## 2019-01-20 MED ORDER — TRAMADOL HCL 50 MG PO TABS: 25.0000 mg | ORAL_TABLET | Freq: Four times a day (QID) | ORAL | Status: DC | PRN

## 2019-01-20 MED ORDER — LORATADINE 10 MG PO TABS
10.0000 mg | ORAL_TABLET | Freq: Every day | ORAL | Status: DC
  Administered 2019-01-20 – 2019-01-21 (×2): 10 mg via ORAL
  Filled 2019-01-20 (×2): qty 1

## 2019-01-20 MED ORDER — LOSARTAN POTASSIUM 50 MG PO TABS: 100.0000 mg | ORAL_TABLET | Freq: Every day | ORAL | Status: DC

## 2019-01-20 MED ORDER — CIPROFLOXACIN HCL 500 MG PO TABS
250.0000 mg | ORAL_TABLET | Freq: Two times a day (BID) | ORAL | Status: DC
  Administered 2019-01-20 – 2019-01-21 (×2): 250 mg via ORAL
  Filled 2019-01-20 (×2): qty 1

## 2019-01-20 MED ORDER — LOSARTAN POTASSIUM 50 MG PO TABS
100.0000 mg | ORAL_TABLET | Freq: Every day | ORAL | Status: DC
  Administered 2019-01-21: 11:00:00 100 mg via ORAL
  Filled 2019-01-20: qty 2

## 2019-01-20 MED ORDER — PRAVASTATIN SODIUM 10 MG PO TABS: 20.0000 mg | ORAL_TABLET | Freq: Every day | ORAL | Status: DC

## 2019-01-20 MED ORDER — AMLODIPINE BESYLATE 5 MG PO TABS
5.0000 mg | ORAL_TABLET | Freq: Every day | ORAL | Status: DC
  Administered 2019-01-20 – 2019-01-21 (×2): 5 mg via ORAL
  Filled 2019-01-20 (×2): qty 1

## 2019-01-20 MED ORDER — FAMOTIDINE 20 MG PO TABS: 40.0000 mg | ORAL_TABLET | Freq: Every day | ORAL | Status: DC

## 2019-01-20 MED ORDER — ASPIRIN EC 81 MG PO TBEC
81.0000 mg | DELAYED_RELEASE_TABLET | Freq: Every day | ORAL | Status: DC
  Administered 2019-01-21: 81 mg via ORAL
  Filled 2019-01-20: qty 1

## 2019-01-20 NOTE — Progress Notes (Signed)
  Speech Language Pathology Treatment: Dysphagia;Cognitive-Linquistic  Patient Details Name: [name] MRN: [id number] DOB: 01-12-1931 Today's Date: 01/20/2019 Time: [id number] SLP Time Calculation (min) (ACUTE ONLY): 17 min  Assessment / Plan / Recommendation Clinical Impression  Pt has continued to improve in all areas. Daughter currently not present; RN in room with pt consuming breakfast who coughed x 2 in the beginning of meal and not since. Pt stated how thick the liquids are and it seemed harder to swallow. Therapist reviewed results of MBS and need for thick liquids Hospital/pharmacy is trialing a new thickener which is a gel and pt prefers this over the pre thickened and pharmacy has not sent up the powder thickener. SLP left 5 gel packs in her room (RN present and aware). Oral transit much improved today likely familiar taste and environment versus barium and sray room. Delayed throat clear noted. Pt tends to talk immediately after swallow and cued to wait until verbalizing. Plan to educate daughter on purchasing Simply thick as pt will need to continue with thickened liquids at SNF. Recommend repeat MBS in approximately 3 weeks or when appropriate from receiving SLP.  Suspect her language is at baseline and cognition getting closer (baseline mild dementia). Response time is appropriate and able to process/hear most information today. Will update goals.      HPI HPI: [name] is a 85 y.o. female with a history of coronary artery disease who was admitted from SNF with abrupt onset of confusion after a nap 2/2 L side CVA. On arrival, she was noted to have right-sided weakness and aphasia, was then treated with IV TPA. Head CT revealed advanced chronic small vessel ischemic disease with chronic left thalamic lacunar infarct.       SLP Plan  Continue with current plan of care       Recommendations  Diet recommendations: Dysphagia 2 (fine chop);Honey-thick liquid Liquids  provided via: Cup;No straw Medication Administration: Crushed with puree Supervision: Patient able to self feed;Intermittent supervision to cue for compensatory strategies Compensations: Multiple dry swallows after each bite/sip;Clear throat intermittently;Effortful swallow Postural Changes and/or Swallow Maneuvers: Seated upright 90 degrees                Oral Care Recommendations: Oral care BID Follow up Recommendations: Skilled Nursing facility SLP Visit Diagnosis: Dysphagia, oropharyngeal phase (R13.12);Cognitive communication deficit [id number]) Plan: Continue with current plan of care                      Houston Siren 01/20/2019, 10:10 AM   Orbie Pyo Colvin Caroli.Ed Risk analyst [phone] Office [phone]

## 2019-01-20 NOTE — Care Management Important Message (Signed)
Important Message  Patient Details  Name: [name] MRN: [id number] Date of Birth: 1930-05-24   Medicare Important Message Given:  Yes     Orbie Pyo 01/20/2019, 3:26 PM

## 2019-01-20 NOTE — NC FL2 (Addendum)
[place] LEVEL OF CARE SCREENING TOOL     IDENTIFICATION  Patient Name: [name] Birthdate: Sep 17, 1930 Sex: female Admission Date (Current Location): 01/17/2019  [facility] and [place] Number:  Herbalist and Address:  [facility], [place] [address] [place], [place] [address]      Provider Number: [id number]  Attending Physician Name and Address:  Garvin Fila, MD  Relative Name and Phone Number:       Current Level of Care: Hospital Recommended Level of Care: [place] Prior Approval Number:    Date Approved/Denied:   PASRR Number: [id number]  Discharge Plan: SNF    Current Diagnoses: Patient Active Problem List   Diagnosis Date Noted  . Stroke (cerebrum) ([place]) 01/17/2019  . PAD (peripheral artery disease) ([place]) 09/27/2018  . Bilateral carotid bruits 07/27/2018  . Pain in limb-Left Knee 04/04/2013  . Peripheral vascular disease, unspecified ([place]) 04/04/2013  . Choledocholithiasis with obstruction 10/30/2011  . PVD (peripheral vascular disease) ([place]) 10/29/2011  . Pancreatitis 10/28/2011  . Cholelithiasis 10/28/2011  . UTI (urinary tract infection) 10/28/2011  . Hypertension 10/28/2011  . Hypercholesterolemia 10/28/2011    Orientation RESPIRATION BLADDER Height & Weight     Self, Time, Place  Normal Incontinent Weight: 137 lb 5.6 oz (62.3 kg) Height:     BEHAVIORAL SYMPTOMS/MOOD NEUROLOGICAL BOWEL NUTRITION STATUS      Incontinent Diet(see DC summary)  AMBULATORY STATUS COMMUNICATION OF NEEDS Skin   Extensive Assist Verbally PU Stage and Appropriate Care PU Stage 1 Dressing: (sacrum, foam dressing; lift every shift to assess, change every 3 days)                     Personal Care Assistance Level of Assistance  Bathing, Feeding, Dressing Bathing Assistance: Limited assistance Feeding assistance: Limited assistance Dressing Assistance: Limited assistance     Functional Limitations  Info  Sight, Hearing, Speech Sight Info: Adequate Hearing Info: Adequate Speech Info: Adequate    SPECIAL CARE FACTORS FREQUENCY  PT (By licensed PT), OT (By licensed OT), Speech therapy     PT Frequency: 5x/wk OT Frequency: 5x/wk     Speech Therapy Frequency: 5x/wk      Contractures Contractures Info: Not present    Additional Factors Info  Code Status, Allergies Code Status Info: DNR Allergies Info: Hydrochlorothiazide, Penicillins, Sulfa Antibiotics           Current Medications (01/20/2019):  This is the current hospital active medication list Current Facility-Administered Medications  Medication Dose Route Frequency Provider Last Rate Last Dose  .  stroke: mapping our early stages of recovery book   Does not apply Once Burnetta Sabin L, NP      . 0.9 %  sodium chloride infusion  50 mL/hr Intravenous Continuous Donzetta Starch, NP 50 mL/hr at 01/20/19 0925 50 mL/hr at 01/20/19 0925  . 0.9 %  sodium chloride infusion   Intravenous PRN Burnetta Sabin L, NP 10 mL/hr at 01/19/19 0700    . acetaminophen (TYLENOL) tablet 650 mg  650 mg Oral Q4H PRN Donzetta Starch, NP       Or  . acetaminophen (TYLENOL) solution 650 mg  650 mg Per Tube Q4H PRN Donzetta Starch, NP       Or  . acetaminophen (TYLENOL) suppository 650 mg  650 mg Rectal Q4H PRN Donzetta Starch, NP      . aspirin tablet 325 mg  325 mg Oral Daily  Donzetta Starch, NP   325 mg at 01/20/19 0920  . cefTRIAXone (ROCEPHIN) 1 g in sodium chloride 0.9 % 100 mL IVPB  1 g Intravenous Q24H Garvin Fila, MD 200 mL/hr at 01/20/19 0924 1 g at 01/20/19 0924  . Chlorhexidine Gluconate Cloth 2 % PADS 6 each  6 each Topical Daily Donzetta Starch, NP   6 each at 01/18/19 1433  . morphine 2 MG/ML injection 1 mg  1 mg Intravenous Q4H PRN Burnetta Sabin L, NP      . nicardipine (CARDENE) 20mg  in 0.86% saline 255ml IV infusion (0.1 mg/ml)  0-15 mg/hr Intravenous Continuous PRN Donzetta Starch, NP   Stopped at 01/18/19 0602  . Resource ThickenUp  Clear   Oral PRN Garvin Fila, MD      . sertraline (ZOLOFT) tablet 25 mg  25 mg Oral Daily Donzetta Starch, NP   25 mg at 01/20/19 0920     Discharge Medications: Please see discharge summary for a list of discharge medications.  Relevant Imaging Results:  Relevant Lab Results:   Additional Information SS#: [id number]  Geralynn Ochs, LCSW  I have personally obtained history,examined this patient, reviewed notes, independently viewed imaging studies, participated in medical decision making and plan of care.ROS completed by me personally and pertinent positives fully documented  I have made any additions or clarifications directly to the above note. Agree with note above.    Antony Contras, MD Medical Director [facility] Pager: [phone] 01/24/2019 10:07 AM

## 2019-01-20 NOTE — TOC Progression Note (Signed)
Transition of Care [facility]) - Progression Note    Patient Details  Name: [name] MRN: [id number] Date of Birth: 05-27-1930  Transition of Care [facility]) CM/SW [place], [place] Phone Number: 01/20/2019, 7:41 PM  Clinical Narrative:   CSW following for discharge plan. CSW spoke with daughters Malachy Mood and [name] today, confirmed interest and bed offer at [facility]. Daughters are concerned that the patient will need long term SNF, and CSW informed daughters to discuss with [facility] social workers upon patient's admission. CSW arranged for patient to admit to [facility] today, informed MD that DC summary was needed to be completed and signed by 4 PM to admit today. CSW confirmed that patient could admit on Saturday, as no discharge was completed yet; CSW updated patient's family that patient could still admit tomorrow. CSW to follow.    Expected Discharge Plan: [place] Barriers to Discharge: Continued Medical Work up  Expected Discharge Plan and Services Expected Discharge Plan: [place] In-house Referral: Clinical Social Work                                             Social Determinants of Health (SDOH) Interventions    Readmission Risk Interventions No flowsheet data found.

## 2019-01-20 NOTE — Progress Notes (Signed)
Patient requests to have hearing aids out at bedtime and placed in upon awakening in the morning.  Birdena Crandall, RN

## 2019-01-20 NOTE — Discharge Summary (Addendum)
Stroke Discharge Summary  Patient ID: [name]   MRN: [id number]      DOB: Jun 08, 1930  Date of Admission: 01/17/2019 Date of Discharge: 01/21/2019  Attending Physician:  No att. providers found, Stroke MD Consultant(s):   Treatment Team:  Stroke, Md, MD Patient's PCP:  Lajean Manes, MD  DISCHARGE DIAGNOSIS:     Stroke like episode treated with IV TPA.  Active Problems:   Urinary tract infection   HTN   PVD   CAD  Past Medical History:  Diagnosis Date  . Adenomatous colon polyp 2007  . Arthritis   . Bilateral carotid bruits 07/27/2018  . CAD (coronary artery disease)   . Chronic back pain   . Gall stone pancreatitis   . Heart murmur   . High cholesterol   . Hx: UTI (urinary tract infection)    Now infrequent  . Hypertension   . Left knee pain Nov. 18, 2014  . Onychomycosis   . Osteoarthritis   . Osteopenia   . PAD (peripheral artery disease) ([place])   . PVD (peripheral vascular disease) ([place])   . Skin ulcer of left heel ([place])   . Trigger finger, right   . Vertigo    Past Surgical History:  Procedure Laterality Date  . ABDOMINAL HYSTERECTOMY     partial  . CHOLECYSTECTOMY  10/30/2011   Procedure: LAPAROSCOPIC CHOLECYSTECTOMY WITH INTRAOPERATIVE CHOLANGIOGRAM;  Surgeon: Zenovia Jarred, MD;  Location: [facility];  Service: General;  Laterality: N/A;  . ENDARTERECTOMY FEMORAL Left 09/27/2018   Procedure: left Endarterectomy Femoral;  Surgeon: Angelia Mould, MD;  Location: [facility];  Service: Vascular;  Laterality: Left;  . EUS  11/11/2011   Procedure: ESOPHAGEAL ENDOSCOPIC ULTRASOUND (EUS) RADIAL;  Surgeon: Arta Silence, MD;  Location: [facility];  Service: Endoscopy;  Laterality: N/A;  possible ERCP  . FEMORAL-POPLITEAL BYPASS GRAFT Left 09/27/2018   Procedure: left FEMORAL THROMBECTOMY;  Surgeon: Angelia Mould, MD;  Location: [facility];  Service: Vascular;  Laterality: Left;  . JOINT REPLACEMENT Left 06-22-06   Hip  . JOINT REPLACEMENT Right 11-26-06    Shoulder  . JOINT REPLACEMENT Right 02-27-06   Knee  . LOWER EXTREMITY ANGIOGRAPHY N/A 09/27/2018   Procedure: LOWER EXTREMITY ANGIOGRAPHY;  Surgeon: Nigel Mormon, MD;  Location: [facility];  Service: Cardiovascular;  Laterality: N/A;  . PATCH ANGIOPLASTY Left 09/27/2018   Procedure: Patch Angioplasty of left femoral artery using xenosure bovine pericardium patch;  Surgeon: Angelia Mould, MD;  Location: [facility];  Service: Vascular;  Laterality: Left;  . PERIPHERAL VASCULAR INTERVENTION  09/27/2018   Procedure: PERIPHERAL VASCULAR INTERVENTION;  Surgeon: Nigel Mormon, MD;  Location: [facility];  Service: Cardiovascular;;  . TOTAL HIP ARTHROPLASTY    . TOTAL KNEE ARTHROPLASTY    . TOTAL SHOULDER ARTHROPLASTY      Allergies as of 01/20/2019      Reactions   Hydrochlorothiazide Other (See Comments)   Dropped sodium level   Penicillins    Unknown  Did it involve swelling of the face/tongue/throat, SOB, or low BP? Unknown Did it involve sudden or severe rash/hives, skin peeling, or any reaction on the inside of your mouth or nose? Unknown Did you need to seek medical attention at a hospital or doctor's office? Unknown When did it last happen?unknown If all above answers are "NO", may proceed with cephalosporin use.   Sulfa Antibiotics    unknown    Medications : Aspirin  81 mg daily Cipro 250 mg twice daily for 5 days Plavix 75 mg daily Norvasc 5 mg daily Zyrtec 10 mg daily Livalol 2 mg daily Losartan 100 mg daily Zoloft 25 mg daily Additional medications added 01/21/2019  Pravachol 20 mg daily  Amlodipine 5 mg daily  Cozaar 100 mg daily      LABORATORY STUDIES CBC    Component Value Date/Time   WBC 8.4 01/21/2019 0343   RBC 3.73 (L) 01/21/2019 0343   HGB 11.8 (L) 01/21/2019 0343   HGB 12.4 09/19/2018 1353   HCT 36.3 01/21/2019 0343   HCT 37.2 09/19/2018 1353   PLT 215 01/21/2019 0343   PLT 255 09/19/2018 1353   MCV 97.3  01/21/2019 0343   MCV 96 09/19/2018 1353   MCH 31.6 01/21/2019 0343   MCHC 32.5 01/21/2019 0343   RDW 12.5 01/21/2019 0343   RDW 11.4 (L) 09/19/2018 1353   LYMPHSABS 2.1 01/17/2019 1647   MONOABS 0.7 01/17/2019 1647   EOSABS 0.1 01/17/2019 1647   BASOSABS 0.0 01/17/2019 1647   CMP    Component Value Date/Time   NA 139 01/21/2019 0343   NA 138 11/17/2018 1516   K 3.3 (L) 01/21/2019 0343   CL 103 01/21/2019 0343   CO2 27 01/21/2019 0343   GLUCOSE 118 (H) 01/21/2019 0343   BUN 10 01/21/2019 0343   BUN 14 11/17/2018 1516   CREATININE 0.76 01/21/2019 0343   CALCIUM 8.7 (L) 01/21/2019 0343   PROT 6.0 (L) 01/17/2019 1647   ALBUMIN 3.6 01/17/2019 1647   AST 19 01/17/2019 1647   ALT 12 01/17/2019 1647   ALKPHOS 73 01/17/2019 1647   BILITOT 0.8 01/17/2019 1647   GFRNONAA >60 01/21/2019 0343   GFRAA >60 01/21/2019 0343   COAGS Lab Results  Component Value Date   INR 1.1 01/17/2019   INR 1.0 07/11/2007   Lipid Panel    Component Value Date/Time   CHOL 114 01/18/2019 1036   TRIG 37 01/18/2019 1036   HDL 46 01/18/2019 1036   CHOLHDL 2.5 01/18/2019 1036   VLDL 7 01/18/2019 1036   LDLCALC 61 01/18/2019 1036   HgbA1C  Lab Results  Component Value Date   HGBA1C 5.4 01/18/2019   Urinalysis    Component Value Date/Time   COLORURINE AMBER (A) 01/19/2019 0156   APPEARANCEUR TURBID (A) 01/19/2019 0156   LABSPEC 1.024 01/19/2019 0156   PHURINE 7.0 01/19/2019 0156   GLUCOSEU NEGATIVE 01/19/2019 0156   HGBUR MODERATE (A) 01/19/2019 0156   BILIRUBINUR NEGATIVE 01/19/2019 0156   KETONESUR 5 (A) 01/19/2019 0156   PROTEINUR 30 (A) 01/19/2019 0156   UROBILINOGEN 0.2 10/27/2011 2204   NITRITE NEGATIVE 01/19/2019 0156   LEUKOCYTESUR LARGE (A) 01/19/2019 0156   Urine Drug Screen No results found for: LABOPIA, COCAINSCRNUR, LABBENZ, AMPHETMU, THCU, LABBARB  Alcohol Level No results found for: ETH   SIGNIFICANT DIAGNOSTIC STUDIES MRI brain- motion degraded study, negative for  acute infarct. Atrophy and moderate to extensive chronic small vessel ischemia.  CT of Head No acute intracranial hemorrhage or evidence of acute demarcated cortical infarction. ASPECTS 10. edemonstrated advanced chronic small vessel ischemic disease with chronic left thalamic lacunar infarct. Moderate generalized parenchymal atrophy.  CTA head and neck: - No intracranial large vessel occlusion. Calcified plaque within the intracranial carotid artery siphons. Resultant mild-to-moderate narrowing of the paraclinoid left internal carotid artery. Additional sites of mild luminal narrowing within the intracranial carotid artery siphons bilaterally. Atherosclerotic irregularity of the basilar artery with a focal  mild to moderate stenosis within the mid basilar artery. - Atherosclerotic disease within the visualized aortic arch and major branch vessels as described. The bilateral common carotid, internal carotid and vertebral arteries are patent within the neck. Plaque at the carotid bifurcations results in less than 50% narrowing of the proximal cervical ICAs bilaterally. Prominent calcified plaque at the origin of the left vertebral artery with suspected at least moderate ostial Narrowing.    HISTORY OF PRESENT ILLNESS [name] is a 85 y.o. female with a history of coronary artery disease, HTN, heart murmur, possible underlying Afib not on anticoagulation who presented with abrupt onset of confusion after a nap.  Due to the abrupt onset of confusion EMS was called and the patient was brought in as a code stroke.   HOSPITAL COURSE On arrival, she was noted to have right-sided weakness and aphasia, CTH neg for hemorrhage or infarct and she was treated with IV TPA after discussion of risks/benefits with the daughter.  CTA was performed which did not demonstrate any large vessel occlusion.   Stroke work up since has been grossly unremarkable. MRI negative for evidence acute infarct (motion  degraded study). Echo grossly unremarkable with EF > 65%, although severe aortic annular calcification, mitral valve degeneration were noted, along with dilated IVC. EEG was negative. Lipid and A1c wnl.    Given the above work up and results, it is likely that patient's AMS was likely not due to an underlying neurological pathology. UA was significant for possible UTI, which could be the culprit in patient's confusion, especially given her age.    DISCHARGE EXAM Blood pressure (!) 177/69, pulse 73, temperature 97.6 F (36.4 C), temperature source Axillary, resp. rate 17, weight 62.3 kg, SpO2 96 %. Pleasant elderly Caucasian lady not in distress. . Afebrile. Head is nontraumatic. Neck is supple without bruit.    Cardiac exam no murmur or gallop. Lungs are clear to auscultation. Distal pulses are well felt. Neurological Exam ;  Awake  Alert oriented x 2.  Diminished attention, registration and recall.. Normal speech and language.eye movements full without nystagmus.fundi were not visualized. Vision acuity and fields appear normal. Hearing is normal. Palatal movements are normal. Face symmetric. Tongue midline. Normal strength, tone, reflexes and coordination. Normal sensation. Gait deferred.  Discharge Diet       There are no active orders of the following types: Diet, Nourishments.   liquids  DISCHARGE PLAN  Disposition:  SNF  ASA 81 mg QD for stroke prevention  Ongoing stroke risk factor control by Primary Care Physician at time of discharge  Follow-up with outpatient stroke clinic in 6 weeks.  Recommend cipro 250 mg BID 5 day course for UTI upon discharge given allergy to penicillin  35 minutes were spent preparing discharge. I have personally obtained history,examined this patient, reviewed notes, independently viewed imaging studies, participated in medical decision making and plan of care.ROS completed by me personally and pertinent positives fully documented  I have made any  additions or clarifications directly to the above note.    Antony Contras, MD Medical Director [facility] Pager: [phone] 01/21/2019 7:03 PM   Addendum 01/21/2019  Additional medications added:  Pravachol 20 mg daily  Amlodipine 5 mg daily  Cozaar 100 mg daily  Diet Change:  Dysphagia II with Nectar thick liquids  May have ice chips prn  May have Ensure or Glucerna (based on dietary recommendations at SNF) without thickening  Rapid Covid Screen ordered for SNF admission  Shanon Brow  Rinehuls PA-C [facility] Pager [phone] 01/21/2019, 7:03 PM   ATTENDING NOTE: I reviewed above note and agree with the assessment and plan. Pt was seen and examined.   Rosalin Hawking, MD PhD Stroke Neurology 01/21/2019 7:05 PM

## 2019-01-20 NOTE — TOC Initial Note (Signed)
Transition of Care [facility]) - Initial/Assessment Note    Patient Details  Name: [name] MRN: [id number] Date of Birth: 05/02/1931  [facility]) CM/SW Contact:    Weston Anna, LCSW Phone Number: 01/20/2019, 11:54 AM  Clinical Narrative:                  CSW spoke with patients daughter, Malachy Mood, via phone to discuss discharge plans. PT is currently recommending SNF at discharge- daughter is agreeable to this plan and prefer; [facility], [facility], or [facility] at discharge. All information sent via the hub.   Daughter states patient has been on the waitlist for [facility] for some time now and they would like for her to transition to their assisted living at some point in the near future. Patient is a current resident at [facility].   Expected Discharge Plan: Skilled Nursing Facility Barriers to Discharge: Continued Medical Work up   Patient Goals and CMS Choice        Expected Discharge Plan and Services Expected Discharge Plan: [place] In-house Referral: Clinical Social Work                                            Prior Living Arrangements/Services   Lives with:: Self Patient language and need for interpreter reviewed:: Yes Do you feel safe going back to the place where you live?: Yes      Need for Family Participation in Patient Care: Yes (Comment) Care giver support system in place?: Yes (comment)      Activities of Daily Living      Permission Sought/Granted                  Emotional Assessment           Psych Involvement: No (comment)  Admission diagnosis:  Acute ischemic stroke [facility]) [I63.9] Patient Active Problem List   Diagnosis Date Noted  . Stroke (cerebrum) ([place]) 01/17/2019  . PAD (peripheral artery disease) ([place]) 09/27/2018  . Bilateral carotid bruits 07/27/2018  . Pain in limb-Left Knee 04/04/2013  . Peripheral vascular disease, unspecified ([place]) 04/04/2013  .  Choledocholithiasis with obstruction 10/30/2011  . PVD (peripheral vascular disease) ([place]) 10/29/2011  . Pancreatitis 10/28/2011  . Cholelithiasis 10/28/2011  . UTI (urinary tract infection) 10/28/2011  . Hypertension 10/28/2011  . Hypercholesterolemia 10/28/2011   PCP:  Lajean Manes, MD Pharmacy:   [facility], [address] [place] [address] Phone: [phone] Fax: [phone]  [facility] [id number] [address], [address] AT [facility] OF [address] [place] [address] Phone: [phone] Fax: [phone]  [place] [facility] [place], [place] [address] Phone: [phone] Fax: [phone]     Social Determinants of Health (SDOH) Interventions    Readmission Risk Interventions No flowsheet data found.

## 2019-01-20 NOTE — Progress Notes (Signed)
Chaplain visited with the patient and the family as a result of a referral from the nurse.  Jocelyn Lamer the daughter expressed stress coming from not knowing exactly what was happening medically with her mother.  Stanton Kidney Mechele Claude) expressed stress from an extended stay in the hospital.  The chaplain offered an empathetic ear and spiritual support if they needed.  Chaplain will follow-up as needed.  Brion Aliment Chaplain Resident For questions concerning this note please contact me by pager [phone]

## 2019-01-21 DIAGNOSIS — I2510 Atherosclerotic heart disease of native coronary artery without angina pectoris: Secondary | ICD-10-CM | POA: Diagnosis not present

## 2019-01-21 DIAGNOSIS — R5381 Other malaise: Secondary | ICD-10-CM | POA: Diagnosis not present

## 2019-01-21 DIAGNOSIS — G459 Transient cerebral ischemic attack, unspecified: Secondary | ICD-10-CM | POA: Diagnosis not present

## 2019-01-21 DIAGNOSIS — Z8673 Personal history of transient ischemic attack (TIA), and cerebral infarction without residual deficits: Secondary | ICD-10-CM | POA: Diagnosis not present

## 2019-01-21 DIAGNOSIS — B952 Enterococcus as the cause of diseases classified elsewhere: Secondary | ICD-10-CM | POA: Diagnosis not present

## 2019-01-21 DIAGNOSIS — I129 Hypertensive chronic kidney disease with stage 1 through stage 4 chronic kidney disease, or unspecified chronic kidney disease: Secondary | ICD-10-CM | POA: Diagnosis not present

## 2019-01-21 DIAGNOSIS — Z959 Presence of cardiac and vascular implant and graft, unspecified: Secondary | ICD-10-CM | POA: Diagnosis not present

## 2019-01-21 DIAGNOSIS — N183 Chronic kidney disease, stage 3 unspecified: Secondary | ICD-10-CM | POA: Diagnosis not present

## 2019-01-21 DIAGNOSIS — R531 Weakness: Secondary | ICD-10-CM | POA: Diagnosis not present

## 2019-01-21 DIAGNOSIS — R2681 Unsteadiness on feet: Secondary | ICD-10-CM | POA: Diagnosis not present

## 2019-01-21 DIAGNOSIS — R609 Edema, unspecified: Secondary | ICD-10-CM | POA: Diagnosis not present

## 2019-01-21 DIAGNOSIS — I739 Peripheral vascular disease, unspecified: Secondary | ICD-10-CM | POA: Diagnosis not present

## 2019-01-21 DIAGNOSIS — R51 Headache: Secondary | ICD-10-CM | POA: Diagnosis not present

## 2019-01-21 DIAGNOSIS — M2550 Pain in unspecified joint: Secondary | ICD-10-CM | POA: Diagnosis not present

## 2019-01-21 DIAGNOSIS — Z7401 Bed confinement status: Secondary | ICD-10-CM | POA: Diagnosis not present

## 2019-01-21 DIAGNOSIS — R29898 Other symptoms and signs involving the musculoskeletal system: Secondary | ICD-10-CM | POA: Diagnosis not present

## 2019-01-21 DIAGNOSIS — R1312 Dysphagia, oropharyngeal phase: Secondary | ICD-10-CM | POA: Diagnosis not present

## 2019-01-21 DIAGNOSIS — R42 Dizziness and giddiness: Secondary | ICD-10-CM | POA: Diagnosis not present

## 2019-01-21 DIAGNOSIS — N3000 Acute cystitis without hematuria: Secondary | ICD-10-CM | POA: Diagnosis not present

## 2019-01-21 DIAGNOSIS — E7800 Pure hypercholesterolemia, unspecified: Secondary | ICD-10-CM | POA: Diagnosis not present

## 2019-01-21 DIAGNOSIS — I10 Essential (primary) hypertension: Secondary | ICD-10-CM | POA: Diagnosis not present

## 2019-01-21 DIAGNOSIS — F0280 Dementia in other diseases classified elsewhere without behavioral disturbance: Secondary | ICD-10-CM | POA: Diagnosis not present

## 2019-01-21 DIAGNOSIS — M6281 Muscle weakness (generalized): Secondary | ICD-10-CM | POA: Diagnosis not present

## 2019-01-21 DIAGNOSIS — R278 Other lack of coordination: Secondary | ICD-10-CM | POA: Diagnosis not present

## 2019-01-21 DIAGNOSIS — F329 Major depressive disorder, single episode, unspecified: Secondary | ICD-10-CM | POA: Diagnosis not present

## 2019-01-21 DIAGNOSIS — L97929 Non-pressure chronic ulcer of unspecified part of left lower leg with unspecified severity: Secondary | ICD-10-CM | POA: Diagnosis not present

## 2019-01-21 DIAGNOSIS — R1310 Dysphagia, unspecified: Secondary | ICD-10-CM | POA: Diagnosis not present

## 2019-01-21 DIAGNOSIS — M1990 Unspecified osteoarthritis, unspecified site: Secondary | ICD-10-CM | POA: Diagnosis not present

## 2019-01-21 DIAGNOSIS — R41841 Cognitive communication deficit: Secondary | ICD-10-CM | POA: Diagnosis not present

## 2019-01-21 DIAGNOSIS — R2689 Other abnormalities of gait and mobility: Secondary | ICD-10-CM | POA: Diagnosis not present

## 2019-01-21 DIAGNOSIS — R52 Pain, unspecified: Secondary | ICD-10-CM | POA: Diagnosis not present

## 2019-01-21 DIAGNOSIS — E46 Unspecified protein-calorie malnutrition: Secondary | ICD-10-CM | POA: Diagnosis not present

## 2019-01-21 DIAGNOSIS — I69351 Hemiplegia and hemiparesis following cerebral infarction affecting right dominant side: Secondary | ICD-10-CM | POA: Diagnosis not present

## 2019-01-21 DIAGNOSIS — N1831 Chronic kidney disease, stage 3a: Secondary | ICD-10-CM | POA: Diagnosis not present

## 2019-01-21 DIAGNOSIS — J302 Other seasonal allergic rhinitis: Secondary | ICD-10-CM | POA: Diagnosis not present

## 2019-01-21 DIAGNOSIS — F320 Major depressive disorder, single episode, mild: Secondary | ICD-10-CM | POA: Diagnosis not present

## 2019-01-21 DIAGNOSIS — N390 Urinary tract infection, site not specified: Secondary | ICD-10-CM | POA: Diagnosis not present

## 2019-01-21 DIAGNOSIS — M8580 Other specified disorders of bone density and structure, unspecified site: Secondary | ICD-10-CM | POA: Diagnosis not present

## 2019-01-21 DIAGNOSIS — E785 Hyperlipidemia, unspecified: Secondary | ICD-10-CM | POA: Diagnosis not present

## 2019-01-21 DIAGNOSIS — G301 Alzheimer's disease with late onset: Secondary | ICD-10-CM | POA: Diagnosis not present

## 2019-01-21 LAB — CBC
HCT: 36.3 % (ref 36.0–46.0)
Hemoglobin: 11.8 g/dL — ABNORMAL LOW (ref 12.0–15.0)
MCH: 31.6 pg (ref 26.0–34.0)
MCHC: 32.5 g/dL (ref 30.0–36.0)
MCV: 97.3 fL (ref 80.0–100.0)
Platelets: 215 10*3/uL (ref 150–400)
RBC: 3.73 MIL/uL — ABNORMAL LOW (ref 3.87–5.11)
RDW: 12.5 % (ref 11.5–15.5)
WBC: 8.4 10*3/uL (ref 4.0–10.5)
nRBC: 0 % (ref 0.0–0.2)

## 2019-01-21 LAB — BASIC METABOLIC PANEL
Anion gap: 9 (ref 5–15)
BUN: 10 mg/dL (ref 8–23)
CO2: 27 mmol/L (ref 22–32)
Calcium: 8.7 mg/dL — ABNORMAL LOW (ref 8.9–10.3)
Chloride: 103 mmol/L (ref 98–111)
Creatinine, Ser: 0.76 mg/dL (ref 0.44–1.00)
GFR calc Af Amer: >60 mL/min (ref >60)
GFR calc non Af Amer: >60 mL/min (ref >60)
Glucose, Bld: 118 mg/dL — ABNORMAL HIGH (ref 70–99)
Potassium: 3.3 mmol/L — ABNORMAL LOW (ref 3.5–5.1)
Sodium: 139 mmol/L (ref 135–145)

## 2019-01-21 LAB — SARS CORONAVIRUS 2 BY RT PCR (HOSPITAL ORDER, PERFORMED IN ~~LOC~~ HOSPITAL LAB): SARS Coronavirus 2: NEGATIVE

## 2019-01-21 MED ORDER — FAMOTIDINE 20 MG PO TABS: 20.0000 mg | ORAL_TABLET | Freq: Every day | ORAL | 0 refills | Status: DC

## 2019-01-21 MED ORDER — CIPROFLOXACIN HCL 250 MG PO TABS: 250.0000 mg | ORAL_TABLET | Freq: Two times a day (BID) | ORAL | 0 refills | Status: DC

## 2019-01-21 MED ORDER — RESOURCE THICKENUP CLEAR PO POWD: ORAL | 0 refills | Status: DC

## 2019-01-21 MED ORDER — PRAVASTATIN SODIUM 20 MG PO TABS: 20.0000 mg | ORAL_TABLET | Freq: Every day | ORAL | 0 refills | Status: AC

## 2019-01-21 NOTE — Progress Notes (Signed)
CSW has reached out to [facility] to see if they will be able to accept the patient today. CSW is awaiting a return phone call.   CSW will continue to follow and assist with discharge.   Domenic Schwab, MSW, [place] Worker [facility]  [phone]

## 2019-01-21 NOTE — Progress Notes (Signed)
  Speech Language Pathology Treatment: Dysphagia  Patient Details Name: [name] MRN: [id number] DOB: March 07, 1931 Today's Date: 01/21/2019 Time: [id number] SLP Time Calculation (min) (ACUTE ONLY): 36 min  Assessment / Plan / Recommendation Clinical Impression  Pt with overt clinical indication of aspiration with thin water after 3rd bolus followed by significant cough; Although pt silently aspirated thin on her MBS - larger amount of aspiraiton likely sensed; She demonstrates consistent throat clearing with intake - that may indicated laryngeal penetration - However able to follow directions to clear strongly and reswallow; Single ice chips tolerated without indication of aspiration; Multiple swallows across all consistencies noted - which is consistent with findings of MBS and facilitates pharyngeal clearance per MBS; Recommend advance to nectar liquids and allow single ice chips.    Concern for adequacy of nutrition present regardless of diet - as pt only desired a few bites of the oatmeal that she enjoyed. Educated her to importance to consume adequate po and reviewed foods/drinks that are naturally nectar thick - Ensure is thick enough without adding any thickener for this pt.     Please order follow up at [facility] for dysphagia management.      HPI HPI: [name] is a 85 y.o. female with a history of coronary artery disease who was admitted from SNF with abrupt onset of confusion after a nap 2/2 L side CVA. On arrival, she was noted to have right-sided weakness and aphasia, was then treated with IV TPA. Head CT revealed advanced chronic small vessel ischemic disease with chronic left thalamic lacunar infarct. Pt underwent an MBS 2 days prior and is to dc to SNF today.  MD desires dietary advancement if pt can tolerate.      SLP Plan  Continue with current plan of care       Recommendations  Diet recommendations: Nectar-thick liquid;Dysphagia 2 (fine chop) Liquids provided via:  Straw Medication Administration: Crushed with puree Supervision: Patient able to self feed;Intermittent supervision to cue for compensatory strategies Compensations: Multiple dry swallows after each bite/sip;Clear throat intermittently;Effortful swallow(clear throat and reswallow if reflexively throat clearing) Postural Changes and/or Swallow Maneuvers: Seated upright 90 degrees;Upright 30-60 min after meal                Oral Care Recommendations: Oral care BID Follow up Recommendations: Skilled Nursing facility SLP Visit Diagnosis: Dysphagia, oropharyngeal phase (R13.12);Cognitive communication deficit [id number]) Plan: Continue with current plan of care       GO                [name] 01/21/2019, 10:15 AM   Luanna Salk, MS [facility] SLP Acute Rehab Services Pager [phone] Office [phone]

## 2019-01-21 NOTE — Progress Notes (Signed)
Gave report to RN at [facility] and pt discharged via [facility] as transportation.

## 2019-01-21 NOTE — TOC Transition Note (Signed)
Transition of Care [facility]) - CM/SW Discharge Note   Patient Details  Name: [name] MRN: [id number] Date of Birth: 12-16-1930  Transition of Care [facility]) CM/SW Contact:  Gelene Mink, [place] Phone Number: 01/21/2019, 1:46 PM   Clinical Narrative:     Patient will DC to: [facility] Anticipated DC date: 01/21/2019 Family notified: Yes Transport by: Corey Harold   Per MD patient ready for DC to . RN, patient, patient's family, and facility notified of DC. Discharge Summary and FL2 sent to facility. RN to call report prior to discharge [phone]). The patient will report to room 7012. DC packet on chart. Ambulance transport requested for patient.   CSW will sign off for now as social work intervention is no longer needed. Please consult [place] again if new needs arise.  Demetry Bendickson, LCSW-A [place]/Clinical Social Work Department Cell: [phone]    Final next level of care: [place] Barriers to Discharge: No Barriers Identified   Patient Goals and CMS Choice Patient states their goals for this hospitalization and ongoing recovery are:: [facility] to [facility].gov Compare Post Acute Care list provided to:: Patient Represenative (must comment) Choice offered to / list presented to : Adult Children  Discharge Placement   Existing PASRR number confirmed : 01/20/19          Patient chooses bed at: [facility] Patient to be transferred to facility by: [facility] Name of family member notified: Cheri & [name] Patient and family notified of of transfer: 01/21/19  Discharge Plan and Services In-house Referral: Clinical Social Work Discharge Planning Services: NA Post Acute Care Choice: [place]          DME Arranged: N/A DME Agency: NA       HH Arranged: NA HH Agency: NA        Social Determinants of Health (SDOH) Interventions     Readmission Risk Interventions No flowsheet data found.

## 2019-01-21 NOTE — Progress Notes (Signed)
Daughter called to check on the pt, stated that pt normally takes Zoloft  Daily, she was informed that the pt already took it in the AM, was concerned about delirium that the pt just started acting different

## 2019-01-23 DIAGNOSIS — I129 Hypertensive chronic kidney disease with stage 1 through stage 4 chronic kidney disease, or unspecified chronic kidney disease: Secondary | ICD-10-CM | POA: Diagnosis not present

## 2019-01-23 DIAGNOSIS — N183 Chronic kidney disease, stage 3 (moderate): Secondary | ICD-10-CM | POA: Diagnosis not present

## 2019-01-23 DIAGNOSIS — N3000 Acute cystitis without hematuria: Secondary | ICD-10-CM | POA: Diagnosis not present

## 2019-01-23 DIAGNOSIS — R531 Weakness: Secondary | ICD-10-CM | POA: Diagnosis not present

## 2019-01-23 LAB — URINE CULTURE: Culture: 10000 — AB

## 2019-01-31 ENCOUNTER — Telehealth: Payer: Self-pay

## 2019-02-01 ENCOUNTER — Ambulatory Visit: Payer: Medicare Other | Admitting: Cardiology

## 2019-02-02 ENCOUNTER — Telehealth: Payer: Medicare Other | Admitting: Cardiology

## 2019-02-02 NOTE — Telephone Encounter (Signed)
LMTCB, asking if monitor was placed before she was discharged from hospital. Nurse mentor is Samara Snide, nurse mentor [phone].

## 2019-02-02 NOTE — Progress Notes (Deleted)
Primary Physician:  Lajean Manes, MD   Patient ID: [name], female    DOB: 01/27/1931, 85 y.o.   MRN: [id number]  Subjective:    No chief complaint on file.  This visit type was conducted due to national recommendations for restrictions regarding the COVID-19 Pandemic (e.g. social distancing).  This format is felt to be most appropriate for this patient at this time.  All issues noted in this document were discussed and addressed.  No physical exam was performed (except for noted visual exam findings with Telehealth visits).  The patient has consented to conduct a Telehealth visit and understands insurance will be billed.   I discussed the limitations of evaluation and management by telemedicine and the availability of in person appointments. The patient expressed understanding and agreed to proceed.  Virtual Visit via Video Note is as below  I connected with Ms. Luskey, on 02/02/19 at 1035 by a video enabled telemedicine application and verified that I am speaking with the correct person using two identifiers.     I have discussed with her regarding the safety during COVID Pandemic and steps and precautions including social distancing with the patient.    HPI: [name]  is a 85 y.o. female  with hypertension, hyperlipidemia, spinal stenosis, PAD with left heel ulceration and abnormal LE arterial duplex suggestive of SFA stenosis. She is s/p angioplasty to left SFA on 09/27/18 and noted to have dissection due to severely calcified and heavy plaque burden to left common femoral artery after the procedure, in which she had urgent patch angioplasty with Dr. Doren Custard also on 09/27/18.   Recently, patient was admitted on 01/17/19 with right sided weakness, aphasia, after waking up from a nap and was confused. Episode felt to be "stroke like" and was given IV TPA; however, neurological imagining was negative for any evidence of acute CVA. MRI was motion degraded. She was noted  to have UTI, and felt to possibly be the culprit, in which she was treated with antibiotics. Apparently, while in the ER, she had a couple of episodes of fast heart rate that were concerning for A fib, but was not caught on telemetry. She now presents for follow up.    Left heel wound and buttock wound have not completely healed.  Blood pressure was elevated at her last office visit. She has been monitoring at home and overall stable. She does report having to use hydralazine a few nights ago.   Past Medical History:  Diagnosis Date  . Adenomatous colon polyp 2007  . Arthritis   . Bilateral carotid bruits 07/27/2018  . CAD (coronary artery disease)   . Chronic back pain   . Gall stone pancreatitis   . Heart murmur   . High cholesterol   . Hx: UTI (urinary tract infection)    Now infrequent  . Hypertension   . Left knee pain Nov. 18, 2014  . Onychomycosis   . Osteoarthritis   . Osteopenia   . PAD (peripheral artery disease) ([place])   . PVD (peripheral vascular disease) ([place])   . Skin ulcer of left heel ([place])   . Trigger finger, right   . Vertigo     Past Surgical History:  Procedure Laterality Date  . ABDOMINAL HYSTERECTOMY     partial  . CHOLECYSTECTOMY  10/30/2011   Procedure: LAPAROSCOPIC CHOLECYSTECTOMY WITH INTRAOPERATIVE CHOLANGIOGRAM;  Surgeon: Zenovia Jarred, MD;  Location: [facility];  Service: General;  Laterality: N/A;  . ENDARTERECTOMY FEMORAL Left  09/27/2018   Procedure: left Endarterectomy Femoral;  Surgeon: Angelia Mould, MD;  Location: [name];  Service: Vascular;  Laterality: Left;  . EUS  11/11/2011   Procedure: ESOPHAGEAL ENDOSCOPIC ULTRASOUND (EUS) RADIAL;  Surgeon: Arta Silence, MD;  Location: [facility];  Service: Endoscopy;  Laterality: N/A;  possible ERCP  . FEMORAL-POPLITEAL BYPASS GRAFT Left 09/27/2018   Procedure: left FEMORAL THROMBECTOMY;  Surgeon: Angelia Mould, MD;  Location: [facility];  Service: Vascular;  Laterality: Left;  . JOINT  REPLACEMENT Left 06-22-06   Hip  . JOINT REPLACEMENT Right 11-26-06   Shoulder  . JOINT REPLACEMENT Right 02-27-06   Knee  . LOWER EXTREMITY ANGIOGRAPHY N/A 09/27/2018   Procedure: LOWER EXTREMITY ANGIOGRAPHY;  Surgeon: Nigel Mormon, MD;  Location: [facility];  Service: Cardiovascular;  Laterality: N/A;  . PATCH ANGIOPLASTY Left 09/27/2018   Procedure: Patch Angioplasty of left femoral artery using xenosure bovine pericardium patch;  Surgeon: Angelia Mould, MD;  Location: [facility];  Service: Vascular;  Laterality: Left;  . PERIPHERAL VASCULAR INTERVENTION  09/27/2018   Procedure: PERIPHERAL VASCULAR INTERVENTION;  Surgeon: Nigel Mormon, MD;  Location: [facility];  Service: Cardiovascular;;  . TOTAL HIP ARTHROPLASTY    . TOTAL KNEE ARTHROPLASTY    . TOTAL SHOULDER ARTHROPLASTY      Social History   Socioeconomic History  . Marital status: Widowed    Spouse name: Not on file  . Number of children: 5  . Years of education: College  . Highest education level: Not on file  Occupational History  . Not on file  Social Needs  . Financial resource strain: Not on file  . Food insecurity    Worry: Not on file    Inability: Not on file  . Transportation needs    Medical: Not on file    Non-medical: Not on file  Tobacco Use  . Smoking status: Never Smoker  . Smokeless tobacco: Never Used  Substance and Sexual Activity  . Alcohol use: No    Alcohol/week: 0.0 standard drinks  . Drug use: No  . Sexual activity: Not on file  Lifestyle  . Physical activity    Days per week: Not on file    Minutes per session: Not on file  . Stress: Not on file  Relationships  . Social Herbalist on phone: Not on file    Gets together: Not on file    Attends religious service: Not on file    Active member of club or organization: Not on file    Attends meetings of clubs or organizations: Not on file    Relationship status: Not on file  . Intimate partner  violence    Fear of current or ex partner: Not on file    Emotionally abused: Not on file    Physically abused: Not on file    Forced sexual activity: Not on file  Other Topics Concern  . Not on file  Social History Narrative   No caffeine use    Review of Systems  Constitution: Negative for decreased appetite, malaise/fatigue, weight gain and weight loss.  Eyes: Negative for visual disturbance.  Cardiovascular: Negative for chest pain, claudication, dyspnea on exertion, leg swelling, orthopnea, palpitations and syncope.  Respiratory: Negative for hemoptysis and wheezing.   Endocrine: Negative for cold intolerance and heat intolerance.  Hematologic/Lymphatic: Does not bruise/bleed easily.  Skin: Positive for poor wound healing (left heel). Negative for nail changes.  Musculoskeletal: Positive for back pain (chronic AND SEVERE. Lenord Fellers, MD) and joint pain (hip, bilateral knee and severe back pain). Negative for muscle weakness and myalgias.       Decreased Range of Motion -right shoulder.  Physical Disability - walks with a cane.  Gastrointestinal: Negative for abdominal pain, change in bowel habit, nausea and vomiting.  Neurological: Positive for dizziness (occasional dizziness and unsteadiness). Negative for difficulty with concentration, focal weakness and headaches.  Psychiatric/Behavioral: Negative for altered mental status and suicidal ideas.  All other systems reviewed and are negative.     Objective:  There were no vitals taken for this visit. There is no height or weight on file to calculate BMI.    Physical exam not performed or limited due to virtual visit.  Patient appeared to be in no distress, Neck was supple, respiration was not labored.  Please see exam details from prior visit is as below.   Physical Exam  Constitutional: She appears well-developed and well-nourished. No distress.  Petite   HENT:  Head: Atraumatic.  Eyes: Conjunctivae are normal.  Neck:  Neck supple. No JVD present. No thyromegaly present.  Cardiovascular: Normal rate, regular rhythm and intact distal pulses. Exam reveals no gallop.  Murmur heard.  Early systolic murmur is present with a grade of 2/6. Aortic Area  Pulses:      Carotid pulses are on the right side with bruit and on the left side with bruit.      Femoral pulses are 1+ on the right side and 1+ on the left side.      Popliteal pulses are 1+ on the right side and 1+ on the left side.       Dorsalis pedis pulses are 2+ on the right side and 2+ on the left side.       Posterior tibial pulses are 1+ on the right side and 1+ on the left side.  Ecchymosis to left foot Ecchymosis to left and right groin Left groin incision with sutures. C/D/I. No dressing. Left thigh drain site open. No drainage.  Groin is soft, mildly tender Trace R LE edema 1+ L LE edema   Pulmonary/Chest: Effort normal and breath sounds normal.  Abdominal: Soft. Bowel sounds are normal.  Musculoskeletal: Normal range of motion.        General: No edema.  Neurological: She is alert.  Skin: Skin is warm and dry. No cyanosis.  Left heel ulceration that is been present for several months and is non-healing. Currently has dressing that is dry and intact.   Psychiatric: She has a normal mood and affect.  Vitals reviewed.  Radiology: No results found.  Laboratory examination:    CMP Latest Ref Rng & Units 01/21/2019 01/20/2019 01/19/2019  Glucose 70 - 99 mg/dL 118(H) 107(H) 90  BUN 8 - 23 mg/dL 10 9 11   Creatinine 0.44 - 1.00 mg/dL 0.76 0.82 0.74  Sodium 135 - 145 mmol/L 139 139 139  Potassium 3.5 - 5.1 mmol/L 3.3(L) 3.4(L) 3.9  Chloride 98 - 111 mmol/L 103 100 105  CO2 22 - 32 mmol/L 27 28 24   Calcium 8.9 - 10.3 mg/dL 8.7(L) 8.9 8.7(L)  Total Protein 6.5 - 8.1 g/dL - - -  Total Bilirubin 0.3 - 1.2 mg/dL - - -  Alkaline Phos 38 - 126 U/L - - -  AST 15 - 41 U/L - - -  ALT 0 - 44 U/L - - -   CBC Latest Ref Rng & Units 01/21/2019  01/18/2019  01/17/2019  WBC 4.0 - 10.5 K/uL 8.4 9.8 -  Hemoglobin 12.0 - 15.0 g/dL 11.8(L) 10.7(L) 12.9  Hematocrit 36.0 - 46.0 % 36.3 33.0(L) 38.0  Platelets 150 - 400 K/uL 215 193 -   Lipid Panel     Component Value Date/Time   CHOL 114 01/18/2019 1036   TRIG 37 01/18/2019 1036   HDL 46 01/18/2019 1036   CHOLHDL 2.5 01/18/2019 1036   VLDL 7 01/18/2019 1036   LDLCALC 61 01/18/2019 1036   HEMOGLOBIN A1C Lab Results  Component Value Date   HGBA1C 5.4 01/18/2019   MPG 108.28 01/18/2019   TSH No results for input(s): TSH in the last 8760 hours.  PRN Meds:. There are no discontinued medications. No outpatient medications have been marked as taking for the 02/02/19 encounter (Appointment) with Miquel Dunn, NP.    Cardiac Studies:   Vasc US with ABI 09/29/2018:  Right: Resting right ankle-brachial index is within normal range. No evidence of significant right lower extremity arterial disease. Left: Resting left ankle-brachial index is within normal range. No evidence of significant left lower extremity arterial disease.  Peripheral arteriogram and angioplasty to CTO left SFA 09/27/2018: 1. Ultrasound guided right common femoral artery and left anterior tibial artery access 2. Abdominal aortogram 3. Crossover technique into left common iliac artery 4. Nonselective bilateral lower extremity distal runoff 5. Selective left lower extremity runoff 6. Successful PTCA and stenting Lt SFA with 3 overlapping stents     5.0 X 150 mm, 5.0 X 120 mm, 5.0 X 60 mm Innova self exapanding stents from distal to ostial Lt SFA.  7. Hemostasis 6/7 Fr Mynx closure right common femoral artery 8. Conscious sedation monitoring 228 min  Echocardiogram 10/21/2016: Left ventricle cavity is normal in size. Mild concentric hypertrophy of the left ventricle. Normal global wall motion. Doppler evidence of grade II (pseudonormal) diastolic dysfunction. Diastolic dysfunction do not suggest elevated LA/LV  endiastolic pressure. Calculated EF 55%. Left atrial cavity is mildly dilated by volume. Right atrial cavity is mildly dilated. Mild aortic valve leaflet calcification. Mildly restricted aortic valve leaflets. Trace aortic valve stenosis. Aortic valve peak pressure gradient of 17 and mean gradient of 7 mmHg, calculated aortic valve area 1.97 cm. Mild (Grade I) aortic regurgitation. Mild to moderate mitral regurgitation. Mild to moderate tricuspid regurgitation. Moderate pulmonary hypertension. Pulmonary artery systolic pressure is estimated at 40 mm Hg. Insignificant pericardial effusion. IVC is dilated with poor inspiration collapse consistent with elevated right atrial pressure.  Lexiscan myoview stress test 10/19/2016: 1. The resting electrocardiogram demonstrated normal sinus rhythm, normal resting conduction, no resting arrhythmias and normal rest repolarization. Resting EKG shows NSR. Stress EKG is non-diagnostic for ischemia as it a pharmacologic stress using Lexiscan. Stress symptoms included dyspnea. There was rare PVC and PAC. 2. Myocardial perfusion imaging is normal. Overall left ventricular systolic function was normal without regional wall motion abnormalities. The left ventricular ejection fraction was 74%.  Carotid artery duplex 02/24/2018: Bilateral carotid arteries show mild heterogeneous plaque without stenosis. Antegrade right vertebral artery flow. Antegrade left vertebral artery flow. No significant change since 02/19/2016.   Assessment:   No diagnosis found.  EKG 07/27/2018: Normal sinus rhythm at 77 bpm, left atrial abnormality, normal axis, no evidence of ischemia.   Recommendations:   Patient underwent complex angioplasty to left SFA on 09/27/18 and noted to have dissection to left femoral at the end of the case. She urgently went for left femoral endarterectomy with bovine pericardial patch angioplasty with  Dr. Scot Dock.  She continues to do well and is now at home.  She has completed PT as of last week. She does still have some balance issues and weakness, but is able to walk farther now. Daughter states that PT aggravated her back pain. They report that her left groin site is healing well and bruising has essentially resolved. She has only mild tenderness to the site. Daughter reports that left heel ulceration has almost completely healed. She does still have some skin peeling to the area. Wound care is still following. She will need to continue with DAPT for at least 6 months to 1 year.  Blood pressure has overall been stable. She did have one episode the other night of BP in the 0000000 systolic that improved with taking hydralazine, will continue the same. Daughters are contemplating putting her back in assisted living due to cost. I encouraged them, if able, to consider holding off for at least a few more weeks due to peak of COVID in [place] at this time. As patient is overall doing well, I will see her back in the office in 3 months or sooner if needed. Encouraged them to contact me for any discolorations or concerns.   Patient was recently admitted to the hospital with CVA, CT of the head showed chronic left traumatic lacunar infarct, felt to be possible etiology of her symptoms.  I suspect CVA related to her advanced age related atherosclerotic disease given her PAD history.  She did have questional episodes of A. fib while in the emergency room, but has not previously had documented A. fib.  Given her lacunar stroke, feel that CVA related to A fib and the likely culprit is her atherosclerotic disease.  She will benefit from low-dose Xarelto to hopefully prevent recurrence of CVA as well as her PAD.  We will stop Plavix and continue with aspirin.  I have discussed bleeding risk with her daughter as well as with the patient, and feel that the benefits outweighed the risk.  They are agreeable to this.   Miquel Dunn, MSN, APRN, FNP-C [facility]. [place]  Office: [phone] Fax: [phone]

## 2019-02-02 NOTE — Telephone Encounter (Signed)
-----   Message from Miquel Dunn, NP sent at 01/30/2019  3:56 PM EDT ----- Regarding: FW: pt had a stroke Please call patients daughter and see if they discharged her with a heart monitor, it doesnt appear so, but wanted to double check.  ----- Message ----- From: Max Fickle Sent: 01/30/2019   3:45 PM EDT To: Miquel Dunn, NP Subject: RE: pt had a stroke                            Pt's daughter called back & the person to contact at [facility] is Hassan Rowan, Financial trader, and the # she may be reached at is [phone]. Maybe 1 of the MAs could call & talk to her re: a monitor.  I don't feel comfortable doing that. ----- Message ----- From: Miquel Dunn, NP Sent: 01/30/2019   2:10 PM EDT To: Max Fickle Subject: RE: pt had a stroke                            So sorry to hear that, hope she is doing better. She will need a heart monitor if they do not have one already in place.  ----- Message ----- From: Max Fickle Sent: 01/30/2019   1:53 PM EDT To: Miquel Dunn, NP Subject: pt had a stroke                                Pt's daughter called & wanted you to know that [name] had a stroke on 9/1.  She's at [facility] now & the virtual appt this Thursday will be w/Abigail Schlechter & 1 of the staff member there.  Pt's daughter, [name], may be reached at [phone].

## 2019-02-08 NOTE — Telephone Encounter (Signed)
LMTCB, asking if monitor was placed before she was discharged from hospital. Nurse mentor is Samara Snide, nurse mentor [phone].

## 2019-02-10 ENCOUNTER — Telehealth (INDEPENDENT_AMBULATORY_CARE_PROVIDER_SITE_OTHER): Payer: Medicare Other | Admitting: Cardiology

## 2019-02-10 ENCOUNTER — Telehealth: Payer: Medicare Other | Admitting: Cardiology

## 2019-02-10 ENCOUNTER — Encounter: Payer: Self-pay | Admitting: Cardiology

## 2019-02-10 ENCOUNTER — Other Ambulatory Visit: Payer: Self-pay

## 2019-02-10 VITALS — BP 142/74 | HR 75 | Temp 97.5°F | Resp 16 | Ht 64.0 in | Wt 136.2 lb

## 2019-02-10 DIAGNOSIS — I10 Essential (primary) hypertension: Secondary | ICD-10-CM | POA: Diagnosis not present

## 2019-02-10 DIAGNOSIS — Z8673 Personal history of transient ischemic attack (TIA), and cerebral infarction without residual deficits: Secondary | ICD-10-CM

## 2019-02-10 DIAGNOSIS — I739 Peripheral vascular disease, unspecified: Secondary | ICD-10-CM

## 2019-02-10 DIAGNOSIS — L97929 Non-pressure chronic ulcer of unspecified part of left lower leg with unspecified severity: Secondary | ICD-10-CM

## 2019-02-10 MED ORDER — XARELTO 2.5 MG PO TABS: 2.5000 mg | ORAL_TABLET | Freq: Two times a day (BID) | ORAL | 2 refills | Status: DC

## 2019-02-10 NOTE — Progress Notes (Signed)
Primary Physician:  Lajean Manes, MD   Patient ID: [name], female    DOB: [birth year], 85 y.o.   MRN: [id number]  Subjective:    Chief Complaint  Patient presents with  . PAD   This visit type was conducted due to national recommendations for restrictions regarding the COVID-19 Pandemic (e.g. social distancing).  This format is felt to be most appropriate for this patient at this time.  All issues noted in this document were discussed and addressed.  No physical exam was performed (except for noted visual exam findings with Telehealth visits).  The patient has consented to conduct a Telehealth visit and understands insurance will be billed.   I discussed the limitations of evaluation and management by telemedicine and the availability of in person appointments. The patient expressed understanding and agreed to proceed.  Virtual Visit via Video Note is as below  I connected with [name], on 02/10/19 at 1215 by a video enabled telemedicine application and verified that I am speaking with the correct person using two identifiers.     I have discussed with her regarding the safety during COVID Pandemic and steps and precautions including social distancing with the patient.    HPI: [name]  is a 85 y.o. female  with hypertension, hyperlipidemia, spinal stenosis, PAD with left heel ulceration and abnormal LE arterial duplex suggestive of SFA stenosis. She is s/p angioplasty to left SFA on 09/27/18 and noted to have dissection due to severely calcified and heavy plaque burden to left common femoral artery after the procedure, in which she had urgent patch angioplasty with Dr. Doren Custard also on 09/27/18.   Recently, patient was admitted on 01/17/19 with right sided weakness, aphasia, after waking up from a nap and was confused. Episode felt to be "stroke like" and was given IV TPA; however, neurological imagining was negative for any evidence of acute CVA. MRI was motion  degraded. She was noted to have UTI, and felt to possibly be the culprit, in which she was treated with antibiotics. Apparently, while in the ER, she had a couple of episodes of fast heart rate that were concerning for A fib, but was not caught on telemetry. She has residual difficulty swallowing, aphasia and right sided weakness have resolved. She now presents for follow up.    She is now residing in [facility] facility. [name] her nurse is present today for our visit. Left heel wound and buttock wound have healed. She continues to have some pain in her left hip area that she feels is related to the procedure; however, she is able now to do a lot of walking and is tolerating this well. Swallowing continues to improve.  Blood pressure has reportedly been stable. Patient is very frustrated with her current living situation.  Past Medical History:  Diagnosis Date  . Adenomatous colon polyp 2007  . Arthritis   . Bilateral carotid bruits 07/27/2018  . CAD (coronary artery disease)   . Chronic back pain   . Gall stone pancreatitis   . Heart murmur   . High cholesterol   . Hx: UTI (urinary tract infection)    Now infrequent  . Hypertension   . Left knee pain Nov. 18, 2014  . Onychomycosis   . Osteoarthritis   . Osteopenia   . PAD (peripheral artery disease) ([place])   . PVD (peripheral vascular disease) ([place])   . Skin ulcer of left heel ([place])   . Trigger finger, right   .  Vertigo     Past Surgical History:  Procedure Laterality Date  . ABDOMINAL HYSTERECTOMY     partial  . CHOLECYSTECTOMY  10/30/2011   Procedure: LAPAROSCOPIC CHOLECYSTECTOMY WITH INTRAOPERATIVE CHOLANGIOGRAM;  Surgeon: Zenovia Jarred, MD;  Location: [facility];  Service: General;  Laterality: N/A;  . ENDARTERECTOMY FEMORAL Left 09/27/2018   Procedure: left Endarterectomy Femoral;  Surgeon: Angelia Mould, MD;  Location: [facility];  Service: Vascular;  Laterality: Left;  . EUS  11/11/2011   Procedure: ESOPHAGEAL  ENDOSCOPIC ULTRASOUND (EUS) RADIAL;  Surgeon: Arta Silence, MD;  Location: [facility];  Service: Endoscopy;  Laterality: N/A;  possible ERCP  . FEMORAL-POPLITEAL BYPASS GRAFT Left 09/27/2018   Procedure: left FEMORAL THROMBECTOMY;  Surgeon: Angelia Mould, MD;  Location: [facility];  Service: Vascular;  Laterality: Left;  . JOINT REPLACEMENT Left 06-22-06   Hip  . JOINT REPLACEMENT Right 11-26-06   Shoulder  . JOINT REPLACEMENT Right 02-27-06   Knee  . LOWER EXTREMITY ANGIOGRAPHY N/A 09/27/2018   Procedure: LOWER EXTREMITY ANGIOGRAPHY;  Surgeon: Nigel Mormon, MD;  Location: [facility];  Service: Cardiovascular;  Laterality: N/A;  . PATCH ANGIOPLASTY Left 09/27/2018   Procedure: Patch Angioplasty of left femoral artery using xenosure bovine pericardium patch;  Surgeon: Angelia Mould, MD;  Location: [facility];  Service: Vascular;  Laterality: Left;  . PERIPHERAL VASCULAR INTERVENTION  09/27/2018   Procedure: PERIPHERAL VASCULAR INTERVENTION;  Surgeon: Nigel Mormon, MD;  Location: [facility];  Service: Cardiovascular;;  . TOTAL HIP ARTHROPLASTY    . TOTAL KNEE ARTHROPLASTY    . TOTAL SHOULDER ARTHROPLASTY      Social History   Socioeconomic History  . Marital status: Widowed    Spouse name: Not on file  . Number of children: 5  . Years of education: College  . Highest education level: Not on file  Occupational History  . Not on file  Social Needs  . Financial resource strain: Not on file  . Food insecurity    Worry: Not on file    Inability: Not on file  . Transportation needs    Medical: Not on file    Non-medical: Not on file  Tobacco Use  . Smoking status: Never Smoker  . Smokeless tobacco: Never Used  Substance and Sexual Activity  . Alcohol use: No    Alcohol/week: 0.0 standard drinks  . Drug use: No  . Sexual activity: Not on file  Lifestyle  . Physical activity    Days per week: Not on file    Minutes per session: Not on file   . Stress: Not on file  Relationships  . Social Herbalist on phone: Not on file    Gets together: Not on file    Attends religious service: Not on file    Active member of club or organization: Not on file    Attends meetings of clubs or organizations: Not on file    Relationship status: Not on file  . Intimate partner violence    Fear of current or ex partner: Not on file    Emotionally abused: Not on file    Physically abused: Not on file    Forced sexual activity: Not on file  Other Topics Concern  . Not on file  Social History Narrative   No caffeine use    Review of Systems  Constitution: Negative for decreased appetite, malaise/fatigue, weight gain and weight loss.  Eyes: Negative for  visual disturbance.  Cardiovascular: Negative for chest pain, claudication, dyspnea on exertion, leg swelling, orthopnea, palpitations and syncope.  Respiratory: Negative for hemoptysis and wheezing.   Endocrine: Negative for cold intolerance and heat intolerance.  Hematologic/Lymphatic: Does not bruise/bleed easily.  Skin: Negative for nail changes and poor wound healing (left heel).  Musculoskeletal: Positive for back pain (chronic AND SEVERE. Lenord Fellers, MD) and joint pain (hip, bilateral knee and severe back pain). Negative for muscle weakness and myalgias.       Decreased Range of Motion -right shoulder.  Physical Disability - walks with a cane.  Gastrointestinal: Negative for abdominal pain, change in bowel habit, nausea and vomiting.  Neurological: Negative for difficulty with concentration, dizziness (occasional dizziness and unsteadiness), focal weakness and headaches.  Psychiatric/Behavioral: Negative for altered mental status and suicidal ideas.  All other systems reviewed and are negative.     Objective:  Blood pressure (!) 142/74, pulse 75, temperature (!) 97.5 F (36.4 C), resp. rate 16, height 5\' 4"  (1.626 m), weight 136 lb 3.2 oz (61.8 kg), SpO2 91 %. Body mass  index is 23.38 kg/m.    Physical exam not performed or limited due to virtual visit.  Patient appeared to be in no distress, Neck was supple, respiration was not labored.  Please see exam details from prior visit is as below.   Physical Exam  Constitutional: She appears well-developed and well-nourished. No distress.  Petite   HENT:  Head: Atraumatic.  Eyes: Conjunctivae are normal.  Neck: Neck supple. No JVD present. No thyromegaly present.  Cardiovascular: Normal rate, regular rhythm and intact distal pulses. Exam reveals no gallop.  Murmur heard.  Early systolic murmur is present with a grade of 2/6. Aortic Area  Pulses:      Carotid pulses are on the right side with bruit and on the left side with bruit.      Femoral pulses are 1+ on the right side and 1+ on the left side.      Popliteal pulses are 1+ on the right side and 1+ on the left side.       Dorsalis pedis pulses are 2+ on the right side and 2+ on the left side.       Posterior tibial pulses are 1+ on the right side and 1+ on the left side.  Ecchymosis to left foot Ecchymosis to left and right groin Left groin incision with sutures. C/D/I. No dressing. Left thigh drain site open. No drainage.  Groin is soft, mildly tender Trace R LE edema 1+ L LE edema   Pulmonary/Chest: Effort normal and breath sounds normal.  Abdominal: Soft. Bowel sounds are normal.  Musculoskeletal: Normal range of motion.        General: No edema.  Neurological: She is alert.  Skin: Skin is warm and dry. No cyanosis.  Left heel ulceration that is been present for several months and is non-healing. Currently has dressing that is dry and intact.   Psychiatric: She has a normal mood and affect.  Vitals reviewed.  Radiology:  MRI of brain 01/18/2019:  Motion degraded study Negative for acute infarct. Atrophy and moderate to extensive chronic small vessel ischemia.  CT of head 01/17/2019: No acute intracranial hemorrhage or evidence of  acute demarcated cortical infarction. ASPECTS 10 Redemonstrated advanced chronic small vessel ischemic disease with chronic left thalamic lacunar infarct. Moderate generalized parenchymal atrophy.  Laboratory examination:    CMP Latest Ref Rng & Units 01/21/2019 01/20/2019 01/19/2019  Glucose 70 - 99  mg/dL 118(H) 107(H) 90  BUN 8 - 23 mg/dL 10 9 11   Creatinine 0.44 - 1.00 mg/dL 0.76 0.82 0.74  Sodium 135 - 145 mmol/L 139 139 139  Potassium 3.5 - 5.1 mmol/L 3.3(L) 3.4(L) 3.9  Chloride 98 - 111 mmol/L 103 100 105  CO2 22 - 32 mmol/L 27 28 24   Calcium 8.9 - 10.3 mg/dL 8.7(L) 8.9 8.7(L)  Total Protein 6.5 - 8.1 g/dL - - -  Total Bilirubin 0.3 - 1.2 mg/dL - - -  Alkaline Phos 38 - 126 U/L - - -  AST 15 - 41 U/L - - -  ALT 0 - 44 U/L - - -   CBC Latest Ref Rng & Units 01/21/2019 01/18/2019 01/17/2019  WBC 4.0 - 10.5 K/uL 8.4 9.8 -  Hemoglobin 12.0 - 15.0 g/dL 11.8(L) 10.7(L) 12.9  Hematocrit 36.0 - 46.0 % 36.3 33.0(L) 38.0  Platelets 150 - 400 K/uL 215 193 -   Lipid Panel     Component Value Date/Time   CHOL 114 01/18/2019 1036   TRIG 37 01/18/2019 1036   HDL 46 01/18/2019 1036   CHOLHDL 2.5 01/18/2019 1036   VLDL 7 01/18/2019 1036   LDLCALC 61 01/18/2019 1036   HEMOGLOBIN A1C Lab Results  Component Value Date   HGBA1C 5.4 01/18/2019   MPG 108.28 01/18/2019   TSH No results for input(s): TSH in the last 8760 hours.  PRN Meds:. Medications Discontinued During This Encounter  Medication Reason  . Carboxymethylcellulose Sodium (THERATEARS OP) Error  . ciprofloxacin (CIPRO) 250 MG tablet Error  . cholecalciferol (VITAMIN D) 1000 UNITS tablet Error  . Emollient (AQUAPHOR ADVANCED THERAPY EX) Error  . clindamycin (CLEOCIN) 150 MG capsule Error  . Menthol (ICY HOT) 5 % PTCH Error  . nystatin (MYCOSTATIN/NYSTOP) powder Error  . PRESCRIPTION MEDICATION Error  . famotidine (PEPCID) 20 MG tablet Error  . antiseptic oral rinse (BIOTENE) LIQD Error   Current Meds  Medication Sig  .  amLODipine (NORVASC) 5 MG tablet Take 1 tablet (5 mg total) by mouth daily.  Marland Kitchen aspirin 81 MG tablet Take 81 mg by mouth daily.  . cetirizine (ZYRTEC) 10 MG tablet Take 10 mg by mouth daily.  . clopidogrel (PLAVIX) 75 MG tablet TAKE 1 TABLET(75 MG) BY MOUTH DAILY (Patient taking differently: Take 75 mg by mouth daily. evening)  . losartan (COZAAR) 100 MG tablet Take 100 mg by mouth daily.  . meclizine (ANTIVERT) 12.5 MG tablet Take 12.5 mg by mouth 3 (three) times daily as needed for dizziness.  . pravastatin (PRAVACHOL) 20 MG tablet Take 1 tablet (20 mg total) by mouth daily at 6 PM.  . sertraline (ZOLOFT) 50 MG tablet Take 25 mg by mouth daily.   . traMADol (ULTRAM) 50 MG tablet Take 25 mg by mouth 3 (three) times daily.     Cardiac Studies:   Vasc US with ABI 09/29/2018:  Right: Resting right ankle-brachial index is within normal range. No evidence of significant right lower extremity arterial disease. Left: Resting left ankle-brachial index is within normal range. No evidence of significant left lower extremity arterial disease.  Peripheral arteriogram and angioplasty to CTO left SFA 09/27/2018: 1. Ultrasound guided right common femoral artery and left anterior tibial artery access 2. Abdominal aortogram 3. Crossover technique into left common iliac artery 4. Nonselective bilateral lower extremity distal runoff 5. Selective left lower extremity runoff 6. Successful PTCA and stenting Lt SFA with 3 overlapping stents     5.0 X 150 mm,  5.0 X 120 mm, 5.0 X 60 mm Innova self exapanding stents from distal to ostial Lt SFA.  7. Hemostasis 6/7 Fr Mynx closure right common femoral artery 8. Conscious sedation monitoring 228 min  Echocardiogram 10/21/2016: Left ventricle cavity is normal in size. Mild concentric hypertrophy of the left ventricle. Normal global wall motion. Doppler evidence of grade II (pseudonormal) diastolic dysfunction. Diastolic dysfunction do not suggest elevated LA/LV  endiastolic pressure. Calculated EF 55%. Left atrial cavity is mildly dilated by volume. Right atrial cavity is mildly dilated. Mild aortic valve leaflet calcification. Mildly restricted aortic valve leaflets. Trace aortic valve stenosis. Aortic valve peak pressure gradient of 17 and mean gradient of 7 mmHg, calculated aortic valve area 1.97 cm. Mild (Grade I) aortic regurgitation. Mild to moderate mitral regurgitation. Mild to moderate tricuspid regurgitation. Moderate pulmonary hypertension. Pulmonary artery systolic pressure is estimated at 40 mm Hg. Insignificant pericardial effusion. IVC is dilated with poor inspiration collapse consistent with elevated right atrial pressure.  Lexiscan myoview stress test 10/19/2016: 1. The resting electrocardiogram demonstrated normal sinus rhythm, normal resting conduction, no resting arrhythmias and normal rest repolarization. Resting EKG shows NSR. Stress EKG is non-diagnostic for ischemia as it a pharmacologic stress using Lexiscan. Stress symptoms included dyspnea. There was rare PVC and PAC. 2. Myocardial perfusion imaging is normal. Overall left ventricular systolic function was normal without regional wall motion abnormalities. The left ventricular ejection fraction was 74%.  Carotid artery duplex 02/24/2018: Bilateral carotid arteries show mild heterogeneous plaque without stenosis. Antegrade right vertebral artery flow. Antegrade left vertebral artery flow. No significant change since 02/19/2016.   Assessment:     ICD-10-CM   1. PAD (peripheral artery disease) ([facility]) s/p angioplasty and repair and endarterectomy on 09/27/2018  I73.9   2. Lower extremity ulceration, left, with unspecified severity ([place])  L97.929   3. History of lacunar cerebrovascular accident  Z86.73   4. Essential hypertension  I10     EKG 07/27/2018: Normal sinus rhythm at 77 bpm, left atrial abnormality, normal axis, no evidence of ischemia.   Recommendations:   Patient  underwent complex angioplasty to left SFA on 09/27/18 and noted to have dissection to left femoral at the end of the case. She urgently went for left femoral endarterectomy with bovine pericardial patch angioplasty with Dr. Scot Dock.  She appears to have recovered well from this and is walking more than she previously did.  Has some discomfort in her lower back and left groin area, but denies any significant claudication symptoms. States she has been walking a lot. By their report, left heel wound and buttock wound have completely healed.  Patient was recently admitted to the hospital with CVA, CT of the head showed chronic left traumatic lacunar infarct, felt to be possible etiology of her symptoms vs. UTI?  I suspect CVA related to her advanced age related atherosclerotic disease given her PAD history.  She did have questional episodes of A. fib while in the emergency room, but has not previously had documented A. fib.  Given her chronic lacunar stroke, feel that CVA related to A fib is unlikely and the likely culprit is her atherosclerotic disease.  She will benefit from low-dose Xarelto to hopefully prevent recurrence of CVA/TIA symptoms as well as her PAD.  We will stop Plavix and continue with aspirin.  I have discussed bleeding risk with her daughters, Jocelyn Lamer and Venida Jarvis, over the phone as well as with the patient and her nurse, and feel that the benefits outweighed the risk.  They are agreeable to this. She will be seeing Dr. Tomi Likens with Neurology in the next few weeks.  She is not currently wearing cardiac monitor for evaluation n.  Patient would have to be wanting for 14 days per facility protocol to come into the office to have monitor placed.  Patient's daughter, Jocelyn Lamer, states that she has already had to be quarantined several times that this is not good for her dementia but she would prefer to hold off on this for now.  I am agreeable to this.  Blood pressure appears to be stable. Patient is obviously  frustrated with not being able to be at home and also isolation with SNF. I have tried to encourage her and reassure her that this is the best thing for her safety at this point. I will plan to see her back in 2-3 months for follow up.    *I have discussed this case with Dr. Einar Gip and he participated in formulating the plan.*    Miquel Dunn, MSN, APRN, FNP-C [facility] [place] Office: [phone] Fax: [phone]

## 2019-02-14 ENCOUNTER — Other Ambulatory Visit: Payer: Self-pay

## 2019-02-14 DIAGNOSIS — I739 Peripheral vascular disease, unspecified: Secondary | ICD-10-CM

## 2019-02-14 MED ORDER — XARELTO 2.5 MG PO TABS: 2.5000 mg | ORAL_TABLET | Freq: Two times a day (BID) | ORAL | 2 refills | Status: DC

## 2019-02-20 DIAGNOSIS — N1831 Chronic kidney disease, stage 3a: Secondary | ICD-10-CM | POA: Diagnosis not present

## 2019-02-20 DIAGNOSIS — F320 Major depressive disorder, single episode, mild: Secondary | ICD-10-CM | POA: Diagnosis not present

## 2019-02-20 DIAGNOSIS — E7800 Pure hypercholesterolemia, unspecified: Secondary | ICD-10-CM | POA: Diagnosis not present

## 2019-02-20 DIAGNOSIS — G301 Alzheimer's disease with late onset: Secondary | ICD-10-CM | POA: Diagnosis not present

## 2019-02-20 DIAGNOSIS — F0280 Dementia in other diseases classified elsewhere without behavioral disturbance: Secondary | ICD-10-CM | POA: Diagnosis not present

## 2019-02-20 DIAGNOSIS — I129 Hypertensive chronic kidney disease with stage 1 through stage 4 chronic kidney disease, or unspecified chronic kidney disease: Secondary | ICD-10-CM | POA: Diagnosis not present

## 2019-02-28 ENCOUNTER — Inpatient Hospital Stay: Payer: Self-pay | Admitting: Adult Health

## 2019-03-01 ENCOUNTER — Telehealth: Payer: Self-pay | Admitting: Neurology

## 2019-03-01 DIAGNOSIS — R52 Pain, unspecified: Secondary | ICD-10-CM | POA: Diagnosis not present

## 2019-03-01 DIAGNOSIS — R1310 Dysphagia, unspecified: Secondary | ICD-10-CM | POA: Diagnosis not present

## 2019-03-01 DIAGNOSIS — N183 Chronic kidney disease, stage 3 unspecified: Secondary | ICD-10-CM | POA: Diagnosis not present

## 2019-03-01 DIAGNOSIS — E46 Unspecified protein-calorie malnutrition: Secondary | ICD-10-CM | POA: Diagnosis not present

## 2019-03-01 DIAGNOSIS — I129 Hypertensive chronic kidney disease with stage 1 through stage 4 chronic kidney disease, or unspecified chronic kidney disease: Secondary | ICD-10-CM | POA: Diagnosis not present

## 2019-03-01 DIAGNOSIS — R42 Dizziness and giddiness: Secondary | ICD-10-CM | POA: Diagnosis not present

## 2019-03-01 DIAGNOSIS — I739 Peripheral vascular disease, unspecified: Secondary | ICD-10-CM | POA: Diagnosis not present

## 2019-03-01 DIAGNOSIS — J302 Other seasonal allergic rhinitis: Secondary | ICD-10-CM | POA: Diagnosis not present

## 2019-03-01 DIAGNOSIS — R29898 Other symptoms and signs involving the musculoskeletal system: Secondary | ICD-10-CM | POA: Diagnosis not present

## 2019-03-01 DIAGNOSIS — F329 Major depressive disorder, single episode, unspecified: Secondary | ICD-10-CM | POA: Diagnosis not present

## 2019-03-01 DIAGNOSIS — E785 Hyperlipidemia, unspecified: Secondary | ICD-10-CM | POA: Diagnosis not present

## 2019-03-01 NOTE — Telephone Encounter (Signed)
Patient's daughter Jocelyn Lamer called regarding her mothers upcoming appt on 03/06/19. The appointment will be virtual at the Facility with Dr. Tomi Likens. She would like to voice some concerns about her mother prior to her appointment on 03/06/19. Please Call [phone]. Thank you

## 2019-03-01 NOTE — Telephone Encounter (Signed)
No answer at 343

## 2019-03-01 NOTE — Telephone Encounter (Signed)
Daughter is calling back. Thanks!

## 2019-03-02 NOTE — Progress Notes (Signed)
Virtual Visit via Video Note The purpose of this virtual visit is to provide medical care while limiting exposure to the novel coronavirus.    Consent was obtained for video visit:  Yes.   Answered questions that patient had about telehealth interaction:  Yes.   I discussed the limitations, risks, security and privacy concerns of performing an evaluation and management service by telemedicine. I also discussed with the patient that there may be a patient responsible charge related to this service. The patient expressed understanding and agreed to proceed.  Pt location: Home Physician Location: office Name of referring provider:  Leeroy Cha,* I connected with [name] at patients initiation/request on 03/06/2019 at  1:50 PM EDT by video enabled telemedicine application and verified that I am speaking with the correct person using two identifiers. Pt MRN:  [id number] Pt DOB:  1931-01-22 Video Participants:  [name];  nurse; daughter (via phone)   History of Present Illness:  [name] is an 85 year old white female with CAD, HTN, questionable A fib who presents for recent TIA.  History supplemented by referring provider note.  She was admitted to [facility] on 01/17/2019 presenting with sudden onset confusion with right sided weakness and aphasia.  In ED, CT head was negative for blled and she was treated with IV tPA.  CTA of head and neck demonstrated on large vessel occlusion or significant stenosis.  She had few runs of tachycardia in ED but telemetry negative for atrial fibrillation.  MRI of brain was negative for acute stroke.  Echocardiogram showed EF >65% with severe aortic annular calcification but no source of emboli.  LDL was 61 and Hgb A1c was 5.4.  Due to confusion, she underwent EEG, which showed generalized theta-delta slowing indicative of moderate diffuse encephalopathy.    UA was positive for UTI, which was thought to be the cause of her  altered mental status.  However, her daughter didn't think that her symptoms were due to the UTI because she has had prior UTIs in the past and never demonstrated these symptoms.  She was discharged on both ASA and Plavix.  Her cardiologist wanted to hook her up to a cardiac event monitor but it would require 2 weeks quarantine, so he discontinued Plavix and just went ahead and started her on Xarelto.    Her daughter reports that she has had more difficulty swallowing since the stroke.  Her memory has been worse.  She does not remember conversations or she is repeating questions.  She has trouble with some instruction which may be affected by her hearing loss.  She has been more irritable with angry outbursts since the hospitalization.  She has been a little more paranoia.  She has some days when she is confused.   She needs assistance with dressing, using toilet and bathing, some of it is compromised by arthritis.  She needs help with transferring (getting in and out of bed and into the bathroom).   She feels lonely.   She is upset about having to be in assisted living facility.  Prior to hospitalization, she has had memory deficits and irritability, thought to be onset of dementia.  She needs help with managing medications.  She is anxious.  However, it is worse since the stroke.   She previously took Aricept and eventually refused to take it.    Current medications:  Xarelto, ASA 81mg , sertraline 25mg  daily, tramadol, amlodipine, pravastatin 20mg  , losartan.  Past Medical  History: Past Medical History:  Diagnosis Date  . Adenomatous colon polyp 2007  . Arthritis   . Bilateral carotid bruits 07/27/2018  . CAD (coronary artery disease)   . Chronic back pain   . Gall stone pancreatitis   . Heart murmur   . High cholesterol   . Hx: UTI (urinary tract infection)    Now infrequent  . Hypertension   . Left knee pain Nov. 18, 2014  . Onychomycosis   . Osteoarthritis   . Osteopenia   . PAD  (peripheral artery disease) ([place])   . PVD (peripheral vascular disease) ([place])   . Skin ulcer of left heel ([place])   . Trigger finger, right   . Vertigo     Medications: Outpatient Encounter Medications as of 03/06/2019  Medication Sig  . amLODipine (NORVASC) 5 MG tablet Take 1 tablet (5 mg total) by mouth daily.  Marland Kitchen aspirin 81 MG tablet Take 81 mg by mouth daily.  . cetirizine (ZYRTEC) 10 MG tablet Take 10 mg by mouth daily.  Marland Kitchen losartan (COZAAR) 100 MG tablet Take 100 mg by mouth daily.  . Maltodextrin-Xanthan Gum (RESOURCE THICKENUP CLEAR) POWD Nectar thick liquids  . meclizine (ANTIVERT) 12.5 MG tablet Take 12.5 mg by mouth 3 (three) times daily as needed for dizziness.  . pravastatin (PRAVACHOL) 20 MG tablet Take 1 tablet (20 mg total) by mouth daily at 6 PM.  . rivaroxaban (XARELTO) 2.5 MG TABS tablet Take 1 tablet (2.5 mg total) by mouth 2 (two) times daily.  . sertraline (ZOLOFT) 50 MG tablet Take 25 mg by mouth daily.   . traMADol (ULTRAM) 50 MG tablet Take 25 mg by mouth 3 (three) times daily.    No facility-administered encounter medications on file as of 03/06/2019.     Allergies: Allergies  Allergen Reactions  . Hydrochlorothiazide Other (See Comments)    Dropped sodium level  . Penicillins     Unknown  Did it involve swelling of the face/tongue/throat, SOB, or low BP? Unknown Did it involve sudden or severe rash/hives, skin peeling, or any reaction on the inside of your mouth or nose? Unknown Did you need to seek medical attention at a hospital or doctor's office? Unknown When did it last happen?unknown If all above answers are "NO", may proceed with cephalosporin use.   . Sulfa Antibiotics     unknown    Family History: Family History  Problem Relation Age of Onset  . Cancer Mother        ovarian  . Pneumonia Father   . Heart attack Brother   . Heart disease Brother        Heart Disease before age 77    Social History: Social History    Socioeconomic History  . Marital status: Widowed    Spouse name: Not on file  . Number of children: 5  . Years of education: College  . Highest education level: Not on file  Occupational History  . Not on file  Social Needs  . Financial resource strain: Not on file  . Food insecurity    Worry: Not on file    Inability: Not on file  . Transportation needs    Medical: Not on file    Non-medical: Not on file  Tobacco Use  . Smoking status: Never Smoker  . Smokeless tobacco: Never Used  Substance and Sexual Activity  . Alcohol use: No    Alcohol/week: 0.0 standard drinks  . Drug use: No  . Sexual activity:  Not on file  Lifestyle  . Physical activity    Days per week: Not on file    Minutes per session: Not on file  . Stress: Not on file  Relationships  . Social Herbalist on phone: Not on file    Gets together: Not on file    Attends religious service: Not on file    Active member of club or organization: Not on file    Attends meetings of clubs or organizations: Not on file    Relationship status: Not on file  . Intimate partner violence    Fear of current or ex partner: Not on file    Emotionally abused: Not on file    Physically abused: Not on file    Forced sexual activity: Not on file  Other Topics Concern  . Not on file  Social History Narrative   No caffeine use    Observations/Objective:   Height 5\' 4"  (1.626 m), weight 134 lb (60.8 kg). No acute distress.  Alert and oriented.  Speech fluent and not dysarthric.  Language intact.   Face symmetric.  Assessment and Plan:   1.  Left hemispheric transient ischemic attack presenting with right sided weakness and aphasia 2.  Dementia, mixed vascular and Alzeimer's 3.  Questionable atrial fibrillation 4.  Hypertension 5.  Hyperlipidemia 6.  Depression/anxiety 7.  Peripheral arterial/vascular disease  1.  ASA 81mg  and Xarelto 2.  Continue statin therapy (LDL at goal less than 70) 3.  Blood pressure  control 4.  She will remain on sertraline 25mg  daily for now.  If mood not improved in 4 weeks, her daughter will contact me and we can increase dose to 50mg  daily. 5.  Defer cholinesterase inhibitor for now. 6.  Follow up in 4 months.  Follow Up Instructions:    -I discussed the assessment and treatment plan with the patient. The patient was provided an opportunity to ask questions and all were answered. The patient agreed with the plan and demonstrated an understanding of the instructions.   The patient was advised to call back or seek an in-person evaluation if the symptoms worsen or if the condition fails to improve as anticipated.  Total Time spent in visit with the patient was:  45 minutes   Dudley Major, DO

## 2019-03-02 NOTE — Telephone Encounter (Signed)
Daughter called and is going to send information to [facility]

## 2019-03-02 NOTE — Telephone Encounter (Signed)
[name] is returning your call

## 2019-03-02 NOTE — Telephone Encounter (Signed)
No answer again at 1040 on 03/02/2019

## 2019-03-06 ENCOUNTER — Encounter: Payer: Self-pay | Admitting: Neurology

## 2019-03-06 ENCOUNTER — Other Ambulatory Visit: Payer: Self-pay

## 2019-03-06 ENCOUNTER — Telehealth (INDEPENDENT_AMBULATORY_CARE_PROVIDER_SITE_OTHER): Payer: Medicare Other | Admitting: Neurology

## 2019-03-06 ENCOUNTER — Telehealth: Payer: Self-pay | Admitting: Neurology

## 2019-03-06 VITALS — Ht 64.0 in | Wt 134.0 lb

## 2019-03-06 DIAGNOSIS — E7800 Pure hypercholesterolemia, unspecified: Secondary | ICD-10-CM

## 2019-03-06 DIAGNOSIS — G459 Transient cerebral ischemic attack, unspecified: Secondary | ICD-10-CM

## 2019-03-06 DIAGNOSIS — I10 Essential (primary) hypertension: Secondary | ICD-10-CM

## 2019-03-06 DIAGNOSIS — R609 Edema, unspecified: Secondary | ICD-10-CM | POA: Diagnosis not present

## 2019-03-06 DIAGNOSIS — F419 Anxiety disorder, unspecified: Secondary | ICD-10-CM

## 2019-03-06 DIAGNOSIS — G309 Alzheimer's disease, unspecified: Secondary | ICD-10-CM

## 2019-03-06 DIAGNOSIS — I739 Peripheral vascular disease, unspecified: Secondary | ICD-10-CM

## 2019-03-06 DIAGNOSIS — F329 Major depressive disorder, single episode, unspecified: Secondary | ICD-10-CM

## 2019-03-06 DIAGNOSIS — M1990 Unspecified osteoarthritis, unspecified site: Secondary | ICD-10-CM | POA: Diagnosis not present

## 2019-03-06 DIAGNOSIS — F0150 Vascular dementia without behavioral disturbance: Secondary | ICD-10-CM

## 2019-03-06 DIAGNOSIS — R29898 Other symptoms and signs involving the musculoskeletal system: Secondary | ICD-10-CM | POA: Diagnosis not present

## 2019-03-06 NOTE — Telephone Encounter (Signed)
No change in management.  She should continue 25mg  daily and if mood not improved in 4 weeks, she should contact me and we can increase to 50mg  daily.

## 2019-03-06 NOTE — Telephone Encounter (Signed)
Spoke with patient daughter and Dr. Tomi Likens he will call her while Virtual with patient.

## 2019-03-06 NOTE — Telephone Encounter (Signed)
See below Patient had virtual visit today

## 2019-03-06 NOTE — Telephone Encounter (Signed)
Patient daughter wanted to make sure to get this information to Dr Tomi Likens   Patient was on the Zoloft 50 mg from 12-24-18 to 01-17-19 she took it in the morning  Then when she had her stroke and on 01-17-19 while at the hospital they reduced her Zoloft to 25 mg. She would like to speak with someone about what they need to do

## 2019-03-07 NOTE — Telephone Encounter (Signed)
Noted Will make [facility] aware that this change needs to be made on patient MAR Will also make patient daughter aware of this.

## 2019-03-07 NOTE — Telephone Encounter (Signed)
Called [facility] no answer at charge nurse station Left message about increase in patient sertraline Also left message to call office back to confirm changes and if we need to send something via fax with changes.

## 2019-03-07 NOTE — Telephone Encounter (Signed)
Called patient daughter [name] left provider response on voice mail Can call back to discuss if she would like.

## 2019-03-07 NOTE — Telephone Encounter (Signed)
If she was on 50mg  daily of sertraline, then she may continue 50mg  daily.

## 2019-03-07 NOTE — Telephone Encounter (Signed)
Spoke with patient daughter she is requesting that patient go back on 50 mg of Zoloft. She is not sure that it was changed. Ok to stay on 50 mg

## 2019-03-31 DIAGNOSIS — R54 Age-related physical debility: Secondary | ICD-10-CM | POA: Diagnosis not present

## 2019-03-31 DIAGNOSIS — E440 Moderate protein-calorie malnutrition: Secondary | ICD-10-CM | POA: Diagnosis not present

## 2019-03-31 DIAGNOSIS — I10 Essential (primary) hypertension: Secondary | ICD-10-CM | POA: Diagnosis not present

## 2019-03-31 DIAGNOSIS — I739 Peripheral vascular disease, unspecified: Secondary | ICD-10-CM | POA: Diagnosis not present

## 2019-04-20 DIAGNOSIS — M419 Scoliosis, unspecified: Secondary | ICD-10-CM | POA: Diagnosis not present

## 2019-04-20 DIAGNOSIS — Z789 Other specified health status: Secondary | ICD-10-CM | POA: Diagnosis not present

## 2019-04-20 DIAGNOSIS — M545 Low back pain: Secondary | ICD-10-CM | POA: Diagnosis not present

## 2019-04-20 DIAGNOSIS — G8929 Other chronic pain: Secondary | ICD-10-CM | POA: Diagnosis not present

## 2019-04-20 DIAGNOSIS — Z7409 Other reduced mobility: Secondary | ICD-10-CM | POA: Diagnosis not present

## 2019-04-28 ENCOUNTER — Other Ambulatory Visit: Payer: Self-pay

## 2019-04-28 NOTE — Patient Outreach (Signed)
Telephone outreach to patient to obtain mRS was successfully completed. Spoke with caregiver. mRS=4   [facility]

## 2019-04-28 NOTE — Patient Outreach (Signed)
First telephone outreach 04/26/2019 attempt to obtain mRS. No answer. Left message for returned call.  Second telephone outreach 04/28/2019 attempt to obtain mRS. No answer. Left message for returned call.  [name] "[name]" [facility]

## 2019-05-15 DIAGNOSIS — R293 Abnormal posture: Secondary | ICD-10-CM | POA: Diagnosis not present

## 2019-05-16 DIAGNOSIS — Z23 Encounter for immunization: Secondary | ICD-10-CM | POA: Diagnosis not present

## 2019-05-17 DIAGNOSIS — R293 Abnormal posture: Secondary | ICD-10-CM | POA: Diagnosis not present

## 2019-05-18 ENCOUNTER — Telehealth (INDEPENDENT_AMBULATORY_CARE_PROVIDER_SITE_OTHER): Payer: Medicare Other | Admitting: Cardiology

## 2019-05-18 ENCOUNTER — Encounter: Payer: Self-pay | Admitting: Cardiology

## 2019-05-18 ENCOUNTER — Other Ambulatory Visit: Payer: Self-pay

## 2019-05-18 VITALS — BP 154/76 | HR 70 | Temp 97.1°F | Ht 64.0 in | Wt 138.8 lb

## 2019-05-18 DIAGNOSIS — Z8673 Personal history of transient ischemic attack (TIA), and cerebral infarction without residual deficits: Secondary | ICD-10-CM | POA: Diagnosis not present

## 2019-05-18 DIAGNOSIS — I872 Venous insufficiency (chronic) (peripheral): Secondary | ICD-10-CM

## 2019-05-18 DIAGNOSIS — I10 Essential (primary) hypertension: Secondary | ICD-10-CM | POA: Diagnosis not present

## 2019-05-18 DIAGNOSIS — I739 Peripheral vascular disease, unspecified: Secondary | ICD-10-CM

## 2019-05-18 MED ORDER — AMLODIPINE BESYLATE 10 MG PO TABS: 10.0000 mg | ORAL_TABLET | Freq: Every day | ORAL | 3 refills | Status: AC

## 2019-05-18 NOTE — Progress Notes (Signed)
Primary Physician:  Javier Glazier, MD   Patient ID: [name], female    DOB: 06/07/1930, 85 y.o.   MRN: [id number]  Subjective:    Chief Complaint  Patient presents with  . Peripheral Artey Disease    3 month follow up   This visit type was conducted due to national recommendations for restrictions regarding the COVID-19 Pandemic (e.g. social distancing).  This format is felt to be most appropriate for this patient at this time.  All issues noted in this document were discussed and addressed.  No physical exam was performed (except for noted visual exam findings with Telehealth visits).  The patient has consented to conduct a Telehealth visit and understands insurance will be billed.   I discussed the limitations of evaluation and management by telemedicine and the availability of in person appointments. The patient expressed understanding and agreed to proceed.  Virtual Visit via Video Note is as below  I connected with Ms. Wilhelm, on 05/23/19 at 1215 by a video enabled telemedicine application and verified that I am speaking with the correct person using two identifiers.     I have discussed with her regarding the safety during COVID Pandemic and steps and precautions including social distancing with the patient.    HPI: [name]  is a 85 y.o. female  with hypertension, hyperlipidemia, spinal stenosis, PAD with left heel ulceration and abnormal LE arterial duplex suggestive of SFA stenosis. She is s/p angioplasty to left SFA on 09/27/18 and noted to have dissection due to severely calcified and heavy plaque burden to left common femoral artery after the procedure, in which she had urgent patch angioplasty with Dr. Doren Custard also on 09/27/18.   Patient was admitted on 01/17/19 with right sided weakness, aphasia, after waking up from a nap and was confused. Episode felt to be "stroke like" and was given IV TPA; however, neurological imagining was negative for any  evidence of acute CVA. MRI was motion degraded. She was noted to have UTI, and felt to possibly be the culprit, in which she was treated with antibiotics. Apparently, while in the ER, she had a couple of episodes of fast heart rate that were concerning for A fib, but was not caught on telemetry. She has residual difficulty swallowing, aphasia and right sided weakness have resolved. At her last visit, it was decided to discontinue Plavix and change to low dose Xarelto in view of her recent CVA felt to likely be related to atherosclerotic disease and unlikely A fib and also in view of PAD.     She is now residing in [place], in which she mentions to me several times today that she is unhappy about and is requesting that I allow her to go home. Little Ishikawa, her nurse is present today for our visit. Left heel wound and buttock wound have healed. Today, she denies any pain to her incision site or her legs. States that she tolerates walking well with the assistance of a walker. Her daughters, due report to me that she is often complaining of pain/tenderness to her groin/ incision area. Her nurse denies any drainage or redness to her incision.   She does have leg swelling, right worse than the left, that improves with leg elevation.  Blood pressure has been elevated lately in the 140 range. Swallowing has continued to improve. She is followed by Dr. Tomi Likens with Neurology.    Past Medical History:  Diagnosis Date  . Adenomatous colon  polyp 2007  . Arthritis   . Bilateral carotid bruits 07/27/2018  . CAD (coronary artery disease)   . Chronic back pain   . Gall stone pancreatitis   . Heart murmur   . High cholesterol   . Hx: UTI (urinary tract infection)    Now infrequent  . Hypertension   . Left knee pain Nov. 18, 2014  . Onychomycosis   . Osteoarthritis   . Osteopenia   . PAD (peripheral artery disease) ([place])   . PVD (peripheral vascular disease) ([place])   . Skin ulcer of  left heel ([place])   . Trigger finger, right   . Vertigo     Past Surgical History:  Procedure Laterality Date  . ABDOMINAL HYSTERECTOMY     partial  . CHOLECYSTECTOMY  10/30/2011   Procedure: LAPAROSCOPIC CHOLECYSTECTOMY WITH INTRAOPERATIVE CHOLANGIOGRAM;  Surgeon: Zenovia Jarred, MD;  Location: [facility];  Service: General;  Laterality: N/A;  . ENDARTERECTOMY FEMORAL Left 09/27/2018   Procedure: left Endarterectomy Femoral;  Surgeon: Angelia Mould, MD;  Location: [facility];  Service: Vascular;  Laterality: Left;  . EUS  11/11/2011   Procedure: ESOPHAGEAL ENDOSCOPIC ULTRASOUND (EUS) RADIAL;  Surgeon: Arta Silence, MD;  Location: [facility];  Service: Endoscopy;  Laterality: N/A;  possible ERCP  . FEMORAL-POPLITEAL BYPASS GRAFT Left 09/27/2018   Procedure: left FEMORAL THROMBECTOMY;  Surgeon: Angelia Mould, MD;  Location: [facility];  Service: Vascular;  Laterality: Left;  . JOINT REPLACEMENT Left 06-22-06   Hip  . JOINT REPLACEMENT Right 11-26-06   Shoulder  . JOINT REPLACEMENT Right 02-27-06   Knee  . LOWER EXTREMITY ANGIOGRAPHY N/A 09/27/2018   Procedure: LOWER EXTREMITY ANGIOGRAPHY;  Surgeon: Nigel Mormon, MD;  Location: [facility];  Service: Cardiovascular;  Laterality: N/A;  . PATCH ANGIOPLASTY Left 09/27/2018   Procedure: Patch Angioplasty of left femoral artery using xenosure bovine pericardium patch;  Surgeon: Angelia Mould, MD;  Location: [facility];  Service: Vascular;  Laterality: Left;  . PERIPHERAL VASCULAR INTERVENTION  09/27/2018   Procedure: PERIPHERAL VASCULAR INTERVENTION;  Surgeon: Nigel Mormon, MD;  Location: [facility];  Service: Cardiovascular;;  . TOTAL HIP ARTHROPLASTY    . TOTAL KNEE ARTHROPLASTY    . TOTAL SHOULDER ARTHROPLASTY      Social History   Socioeconomic History  . Marital status: Widowed    Spouse name: Not on file  . Number of children: 5  . Years of education: College  . Highest education level: Some  college, no degree  Occupational History  . Not on file  Tobacco Use  . Smoking status: Never Smoker  . Smokeless tobacco: Never Used  Substance and Sexual Activity  . Alcohol use: No    Alcohol/week: 0.0 standard drinks  . Drug use: No  . Sexual activity: Not on file  Other Topics Concern  . Not on file  Social History Narrative   No caffeine use   Social Determinants of Health   Financial Resource Strain:   . Difficulty of Paying Living Expenses: Not on file  Food Insecurity:   . Worried About Charity fundraiser in the Last Year: Not on file  . Ran Out of Food in the Last Year: Not on file  Transportation Needs:   . Lack of Transportation (Medical): Not on file  . Lack of Transportation (Non-Medical): Not on file  Physical Activity:   . Days of Exercise per Week: Not on file  . Minutes of  Exercise per Session: Not on file  Stress:   . Feeling of Stress : Not on file  Social Connections:   . Frequency of Communication with Friends and Family: Not on file  . Frequency of Social Gatherings with Friends and Family: Not on file  . Attends Religious Services: Not on file  . Active Member of Clubs or Organizations: Not on file  . Attends Archivist Meetings: Not on file  . Marital Status: Not on file  Intimate Partner Violence:   . Fear of Current or Ex-Partner: Not on file  . Emotionally Abused: Not on file  . Physically Abused: Not on file  . Sexually Abused: Not on file    Review of Systems  Constitution: Negative for decreased appetite, malaise/fatigue, weight gain and weight loss.  Eyes: Negative for visual disturbance.  Cardiovascular: Positive for leg swelling. Negative for chest pain, claudication, dyspnea on exertion, orthopnea, palpitations and syncope.  Respiratory: Negative for hemoptysis and wheezing.   Endocrine: Negative for cold intolerance and heat intolerance.  Hematologic/Lymphatic: Does not bruise/bleed easily.  Skin: Negative for nail  changes and poor wound healing (left heel).  Musculoskeletal: Positive for back pain (chronic AND SEVERE. Lenord Fellers, MD) and joint pain (hip, bilateral knee and severe back pain). Negative for muscle weakness and myalgias.       Decreased Range of Motion -right shoulder.  Physical Disability - walks with a cane.  Gastrointestinal: Negative for abdominal pain, change in bowel habit, nausea and vomiting.  Neurological: Negative for difficulty with concentration, dizziness (occasional dizziness and unsteadiness), focal weakness and headaches.  Psychiatric/Behavioral: Negative for altered mental status and suicidal ideas.  All other systems reviewed and are negative.     Objective:  Blood pressure (!) 154/76, pulse 70, temperature (!) 97.1 F (36.2 C), temperature source Oral, height 5\' 4"  (1.626 m), weight 138 lb 12.8 oz (63 kg), SpO2 96 %. Body mass index is 23.82 kg/m.    Physical exam not performed or limited due to virtual visit.  Patient appeared to be in no distress, Neck was supple, respiration was not labored.  Please see exam details from prior visit is as below.   Physical Exam  Constitutional: She appears well-developed and well-nourished. No distress.  Petite   HENT:  Head: Atraumatic.  Eyes: Conjunctivae are normal.  Neck: No JVD present. No thyromegaly present.  Cardiovascular: Normal rate, regular rhythm and intact distal pulses. Exam reveals no gallop.  Murmur heard.  Early systolic murmur is present with a grade of 2/6. Aortic Area  Pulses:      Carotid pulses are on the right side with bruit and on the left side with bruit.      Femoral pulses are 1+ on the right side and 1+ on the left side.      Popliteal pulses are 1+ on the right side and 1+ on the left side.       Dorsalis pedis pulses are 2+ on the right side and 2+ on the left side.       Posterior tibial pulses are 1+ on the right side and 1+ on the left side.  Ecchymosis to left foot Ecchymosis to left  and right groin Left groin incision with sutures. C/D/I. No dressing. Left thigh drain site open. No drainage.  Groin is soft, mildly tender Trace R LE edema 1+ L LE edema   Pulmonary/Chest: Effort normal and breath sounds normal.  Abdominal: Soft. Bowel sounds are normal.  Musculoskeletal:  General: No edema. Normal range of motion.     Cervical back: Neck supple.  Neurological: She is alert.  Skin: Skin is warm and dry. No cyanosis.  Left heel ulceration that is been present for several months and is non-healing. Currently has dressing that is dry and intact.   Psychiatric: She has a normal mood and affect.  Vitals reviewed.  Radiology:  MRI of brain 01/18/2019:  Motion degraded study Negative for acute infarct. Atrophy and moderate to extensive chronic small vessel ischemia.  CT of head 01/17/2019: No acute intracranial hemorrhage or evidence of acute demarcated cortical infarction. ASPECTS 10 Redemonstrated advanced chronic small vessel ischemic disease with chronic left thalamic lacunar infarct. Moderate generalized parenchymal atrophy.  Laboratory examination:    CMP Latest Ref Rng & Units 01/21/2019 01/20/2019 01/19/2019  Glucose 70 - 99 mg/dL 118(H) 107(H) 90  BUN 8 - 23 mg/dL 10 9 11   Creatinine 0.44 - 1.00 mg/dL 0.76 0.82 0.74  Sodium 135 - 145 mmol/L 139 139 139  Potassium 3.5 - 5.1 mmol/L 3.3(L) 3.4(L) 3.9  Chloride 98 - 111 mmol/L 103 100 105  CO2 22 - 32 mmol/L 27 28 24   Calcium 8.9 - 10.3 mg/dL 8.7(L) 8.9 8.7(L)  Total Protein 6.5 - 8.1 g/dL - - -  Total Bilirubin 0.3 - 1.2 mg/dL - - -  Alkaline Phos 38 - 126 U/L - - -  AST 15 - 41 U/L - - -  ALT 0 - 44 U/L - - -   CBC Latest Ref Rng & Units 01/21/2019 01/18/2019 01/17/2019  WBC 4.0 - 10.5 K/uL 8.4 9.8 -  Hemoglobin 12.0 - 15.0 g/dL 11.8(L) 10.7(L) 12.9  Hematocrit 36.0 - 46.0 % 36.3 33.0(L) 38.0  Platelets 150 - 400 K/uL 215 193 -   Lipid Panel     Component Value Date/Time   CHOL 114 01/18/2019  1036   TRIG 37 01/18/2019 1036   HDL 46 01/18/2019 1036   CHOLHDL 2.5 01/18/2019 1036   VLDL 7 01/18/2019 1036   LDLCALC 61 01/18/2019 1036   HEMOGLOBIN A1C Lab Results  Component Value Date   HGBA1C 5.4 01/18/2019   MPG 108.28 01/18/2019   TSH No results for input(s): TSH in the last 8760 hours.  PRN Meds:. Medications Discontinued During This Encounter  Medication Reason  . amLODipine (NORVASC) 5 MG tablet Discontinued by provider   Current Meds  Medication Sig  . acetaminophen (TYLENOL) 650 MG CR tablet Take 650 mg by mouth 3 (three) times daily.  Marland Kitchen aspirin 81 MG tablet Take 81 mg by mouth daily.  . cetirizine (ZYRTEC) 10 MG tablet Take 10 mg by mouth daily.  Marland Kitchen losartan (COZAAR) 100 MG tablet Take 100 mg by mouth daily.  . Maltodextrin-Xanthan Gum (RESOURCE THICKENUP CLEAR) POWD Nectar thick liquids  . meclizine (ANTIVERT) 12.5 MG tablet Take 12.5 mg by mouth 3 (three) times daily as needed for dizziness.  . pravastatin (PRAVACHOL) 20 MG tablet Take 1 tablet (20 mg total) by mouth daily at 6 PM.  . rivaroxaban (XARELTO) 2.5 MG TABS tablet Take 1 tablet (2.5 mg total) by mouth 2 (two) times daily.  . sertraline (ZOLOFT) 50 MG tablet Take 25 mg by mouth daily.   . traMADol (ULTRAM) 50 MG tablet Take 25 mg by mouth 3 (three) times daily.   . [DISCONTINUED] amLODipine (NORVASC) 5 MG tablet Take 1 tablet (5 mg total) by mouth daily.    Cardiac Studies:   Vasc US with ABI 09/29/2018:  Right: Resting right ankle-brachial index is within normal range. No evidence of significant right lower extremity arterial disease. Left: Resting left ankle-brachial index is within normal range. No evidence of significant left lower extremity arterial disease.  Peripheral arteriogram and angioplasty to CTO left SFA 09/27/2018: 1. Ultrasound guided right common femoral artery and left anterior tibial artery access 2. Abdominal aortogram 3. Crossover technique into left common iliac artery 4.  Nonselective bilateral lower extremity distal runoff 5. Selective left lower extremity runoff 6. Successful PTCA and stenting Lt SFA with 3 overlapping stents     5.0 X 150 mm, 5.0 X 120 mm, 5.0 X 60 mm Innova self exapanding stents from distal to ostial Lt SFA.  7. Hemostasis 6/7 Fr Mynx closure right common femoral artery 8. Conscious sedation monitoring 228 min  Echocardiogram 10/21/2016: Left ventricle cavity is normal in size. Mild concentric hypertrophy of the left ventricle. Normal global wall motion. Doppler evidence of grade II (pseudonormal) diastolic dysfunction. Diastolic dysfunction do not suggest elevated LA/LV endiastolic pressure. Calculated EF 55%. Left atrial cavity is mildly dilated by volume. Right atrial cavity is mildly dilated. Mild aortic valve leaflet calcification. Mildly restricted aortic valve leaflets. Trace aortic valve stenosis. Aortic valve peak pressure gradient of 17 and mean gradient of 7 mmHg, calculated aortic valve area 1.97 cm. Mild (Grade I) aortic regurgitation. Mild to moderate mitral regurgitation. Mild to moderate tricuspid regurgitation. Moderate pulmonary hypertension. Pulmonary artery systolic pressure is estimated at 40 mm Hg. Insignificant pericardial effusion. IVC is dilated with poor inspiration collapse consistent with elevated right atrial pressure.  Lexiscan myoview stress test 10/19/2016: 1. The resting electrocardiogram demonstrated normal sinus rhythm, normal resting conduction, no resting arrhythmias and normal rest repolarization. Resting EKG shows NSR. Stress EKG is non-diagnostic for ischemia as it a pharmacologic stress using Lexiscan. Stress symptoms included dyspnea. There was rare PVC and PAC. 2. Myocardial perfusion imaging is normal. Overall left ventricular systolic function was normal without regional wall motion abnormalities. The left ventricular ejection fraction was 74%.  Carotid artery duplex 02/24/2018: Bilateral carotid  arteries show mild heterogeneous plaque without stenosis. Antegrade right vertebral artery flow. Antegrade left vertebral artery flow. No significant change since 02/19/2016.   Assessment:     ICD-10-CM   1. PAD (peripheral artery disease) ([facility]) s/p angioplasty and repair and endarterectomy on 09/27/2018  I73.9   2. History of lacunar cerebrovascular accident  Z86.73   3. Essential hypertension  I10     EKG 07/27/2018: Normal sinus rhythm at 77 bpm, left atrial abnormality, normal axis, no evidence of ischemia.   Recommendations:   Patient underwent complex angioplasty to left SFA on 09/27/18 and noted to have dissection to left femoral at the end of the case. She urgently went for left femoral endarterectomy with bovine pericardial patch angioplasty with Dr. Scot Dock.    Since our last virtual visit 3 months ago, she appears to be doing quite well. She is without any symptoms of claudication. I have requested that she be walked several times a day to prevent deconditioning and also to help with her PAD. Today she denies any tenderness to her incision site; however, daughters report that she frequently complains of this. By nurses report, no complications at her incision. I suspect may be related to irritation with sitting in her chair most of the day, hopefully frequent walking will also help with this. Previous wounds have now completely healed. Continued with ASA and low dose Xarelto. No bleeding diathesis.   Her blood pressure  is elevated today and appears to have been elevated frequently at the facility. I will further increase her amlodipine to 10 mg daily. I suspect her leg edema is related to venous insufficiency and dependent edema from being in wheelchair. Has minimal leg edema by exam today. I would recommend that we continue with conservative measures of leg elevation to help with this. She has had severe hyponatremia in the past with diuretic use.  I will request her recent labs for  our records. She will need aggressive lipid control. Patient does suffer from mixed Dementia that is being followed by Neurology. I have again reiterated to the patient that I feel for her safety, living in a skilled nursing facility is in her best interest. I have tried to reassure her. Plan of care was also discussed with her daughter, [name], over the phone. She was agreeable to the plan. Will see her back in 3 months for follow up.    Miquel Dunn, MSN, APRN, FNP-C [facility]. [place] Office: [phone] Fax: [phone]

## 2019-05-18 NOTE — Telephone Encounter (Signed)
Please read

## 2019-05-22 DIAGNOSIS — R293 Abnormal posture: Secondary | ICD-10-CM | POA: Diagnosis not present

## 2019-05-24 DIAGNOSIS — R293 Abnormal posture: Secondary | ICD-10-CM | POA: Diagnosis not present

## 2019-05-29 ENCOUNTER — Encounter (HOSPITAL_BASED_OUTPATIENT_CLINIC_OR_DEPARTMENT_OTHER): Payer: Self-pay | Admitting: Emergency Medicine

## 2019-05-29 ENCOUNTER — Emergency Department (HOSPITAL_BASED_OUTPATIENT_CLINIC_OR_DEPARTMENT_OTHER): Payer: Medicare Other

## 2019-05-29 ENCOUNTER — Emergency Department (HOSPITAL_BASED_OUTPATIENT_CLINIC_OR_DEPARTMENT_OTHER)
Admission: EM | Admit: 2019-05-29 | Discharge: 2019-05-29 | Disposition: A | Discharge status: Home / Self Care | Payer: Medicare Other | Attending: Emergency Medicine | Admitting: Emergency Medicine

## 2019-05-29 ENCOUNTER — Other Ambulatory Visit: Payer: Self-pay

## 2019-05-29 DIAGNOSIS — M79642 Pain in left hand: Secondary | ICD-10-CM | POA: Diagnosis not present

## 2019-05-29 DIAGNOSIS — Z23 Encounter for immunization: Secondary | ICD-10-CM | POA: Diagnosis not present

## 2019-05-29 DIAGNOSIS — M25512 Pain in left shoulder: Secondary | ICD-10-CM | POA: Insufficient documentation

## 2019-05-29 DIAGNOSIS — E782 Mixed hyperlipidemia: Secondary | ICD-10-CM | POA: Insufficient documentation

## 2019-05-29 DIAGNOSIS — Y999 Unspecified external cause status: Secondary | ICD-10-CM | POA: Insufficient documentation

## 2019-05-29 DIAGNOSIS — S0990XA Unspecified injury of head, initial encounter: Secondary | ICD-10-CM | POA: Insufficient documentation

## 2019-05-29 DIAGNOSIS — W1839XA Other fall on same level, initial encounter: Secondary | ICD-10-CM | POA: Diagnosis not present

## 2019-05-29 DIAGNOSIS — H5702 Anisocoria: Secondary | ICD-10-CM | POA: Diagnosis not present

## 2019-05-29 DIAGNOSIS — Z8673 Personal history of transient ischemic attack (TIA), and cerebral infarction without residual deficits: Secondary | ICD-10-CM | POA: Insufficient documentation

## 2019-05-29 DIAGNOSIS — W19XXXA Unspecified fall, initial encounter: Secondary | ICD-10-CM | POA: Diagnosis not present

## 2019-05-29 DIAGNOSIS — Y9389 Activity, other specified: Secondary | ICD-10-CM | POA: Diagnosis not present

## 2019-05-29 DIAGNOSIS — I2510 Atherosclerotic heart disease of native coronary artery without angina pectoris: Secondary | ICD-10-CM | POA: Diagnosis not present

## 2019-05-29 DIAGNOSIS — I10 Essential (primary) hypertension: Secondary | ICD-10-CM | POA: Insufficient documentation

## 2019-05-29 DIAGNOSIS — S4992XA Unspecified injury of left shoulder and upper arm, initial encounter: Secondary | ICD-10-CM | POA: Diagnosis not present

## 2019-05-29 DIAGNOSIS — Y92002 Bathroom of unspecified non-institutional (private) residence single-family (private) house as the place of occurrence of the external cause: Secondary | ICD-10-CM | POA: Insufficient documentation

## 2019-05-29 DIAGNOSIS — R293 Abnormal posture: Secondary | ICD-10-CM | POA: Diagnosis not present

## 2019-05-29 DIAGNOSIS — S61213A Laceration without foreign body of left middle finger without damage to nail, initial encounter: Secondary | ICD-10-CM

## 2019-05-29 DIAGNOSIS — R58 Hemorrhage, not elsewhere classified: Secondary | ICD-10-CM | POA: Diagnosis not present

## 2019-05-29 DIAGNOSIS — H5709 Other anomalies of pupillary function: Secondary | ICD-10-CM | POA: Diagnosis not present

## 2019-05-29 IMAGING — CT CT CERVICAL SPINE W/O CM
3 of 4 series · 13 of 33 positions shown, 16 images · non-contrast
Comparison: CT brain, CT angiogram head and neck, [DATE]

CLINICAL DATA: Fall, head injury

EXAM:
CT HEAD WITHOUT CONTRAST
CT CERVICAL SPINE WITHOUT CONTRAST
TECHNIQUE: Multidetector CT imaging of the head and cervical spine was
performed following the standard protocol without intravenous
contrast. Multiplanar CT image reconstructions of the cervical spine
were also generated.

[Series 3: c_spine 2.0 i30s 3 · axial · 0.37mm/px · z∈[-256,-144]mm · 5 of 82 slices shown, 7 images]
[im 14/82  soft-tissue]
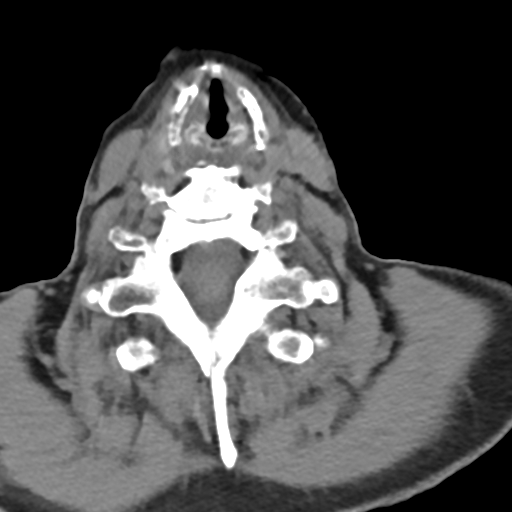
[im 14/82  bone]
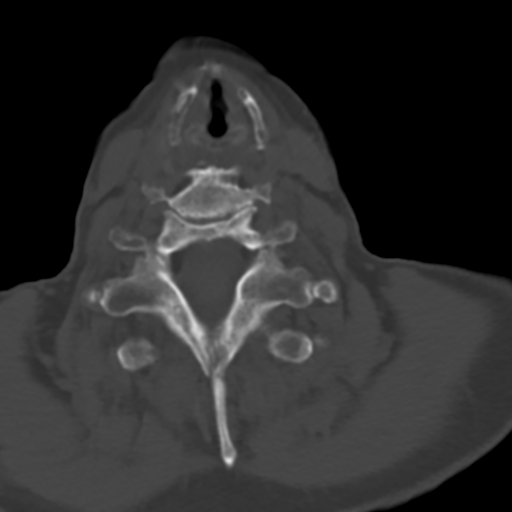
[im 28/82  bone]
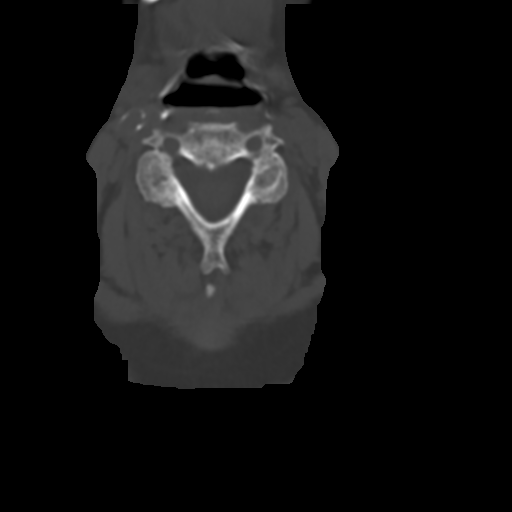
[im 41/82  bone]
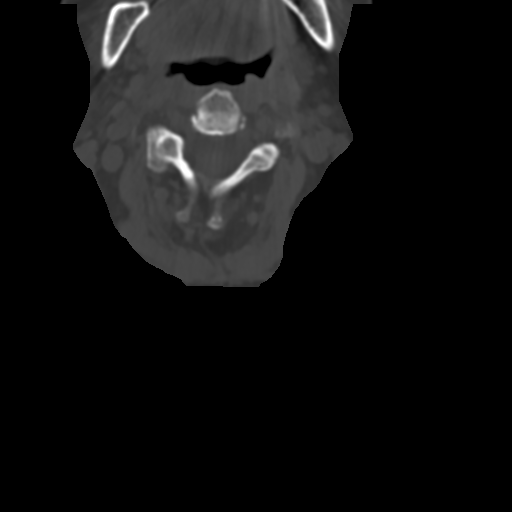
[im 55/82  bone]
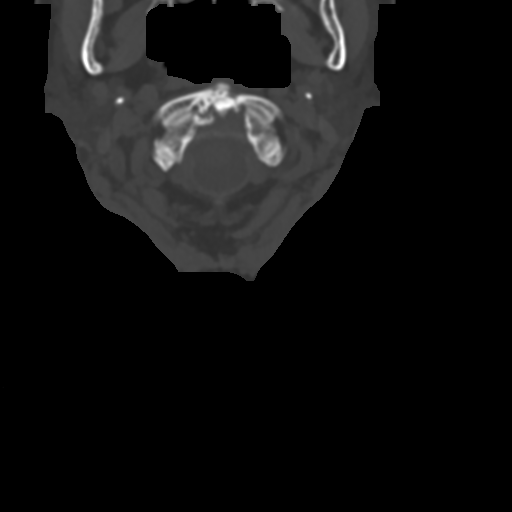
[im 68/82  soft-tissue]
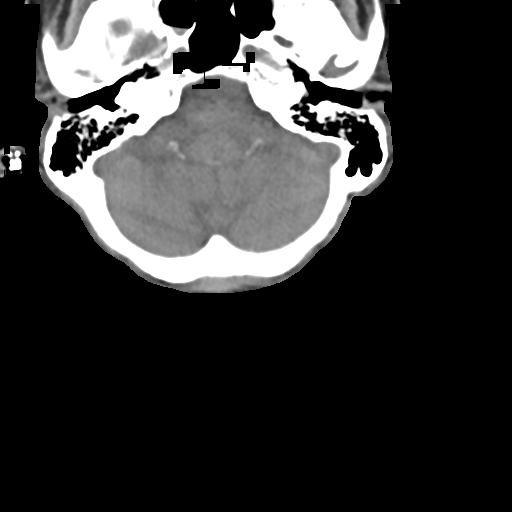
[im 68/82  bone]
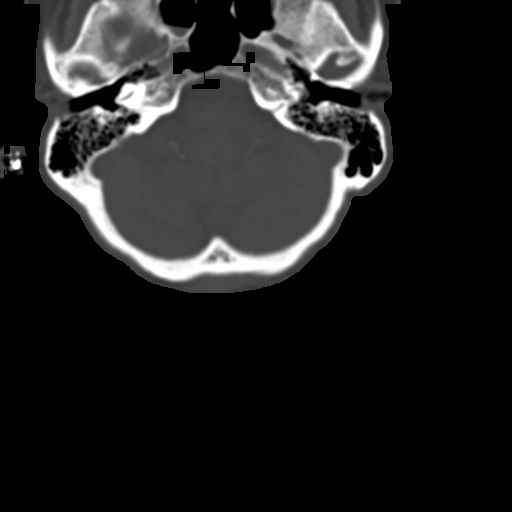

[Series 5: coronals · coronal · 0.30mm/px · 3 of 74 slices shown]
[im 15/74  bone]
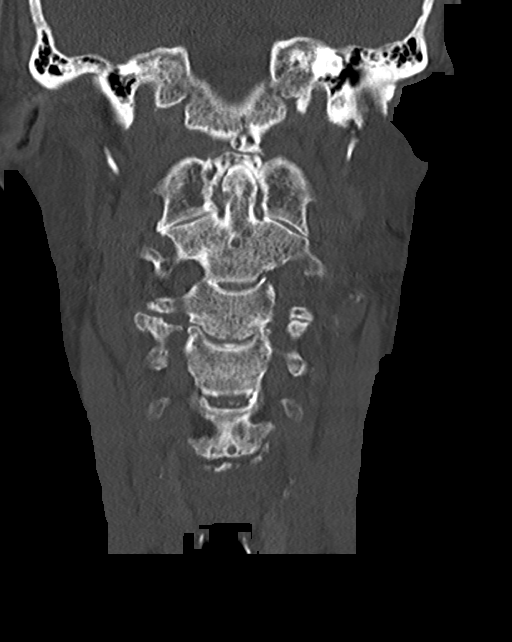
[im 30/74  bone]
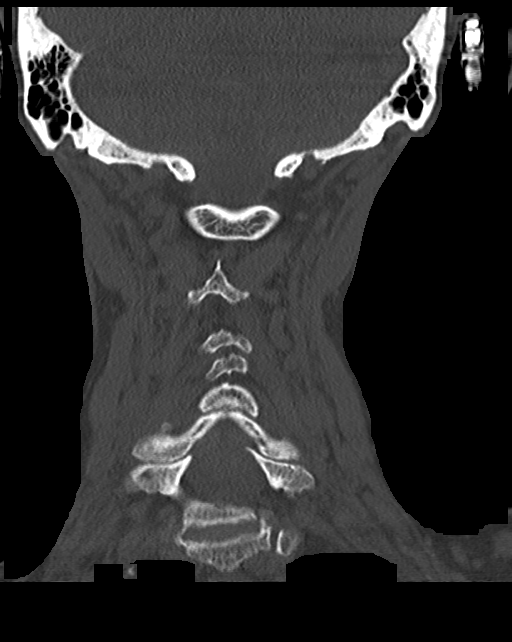
[im 44/74  bone]
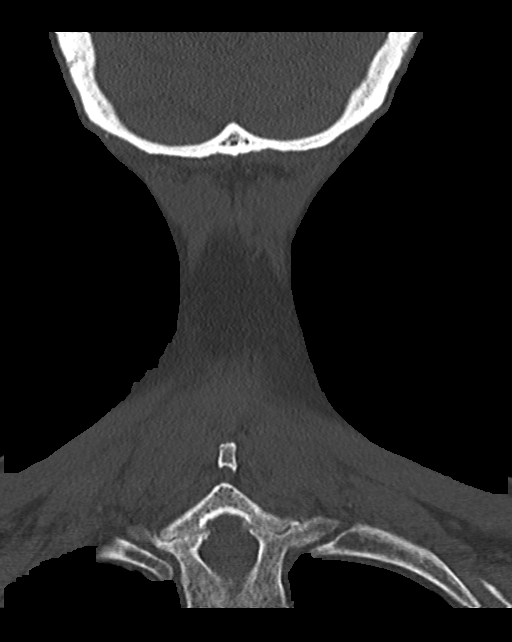

[Series 6: sagittals · sagittal · 0.30mm/px · 5 of 72 slices shown, 6 images]
[im 24/72  bone]
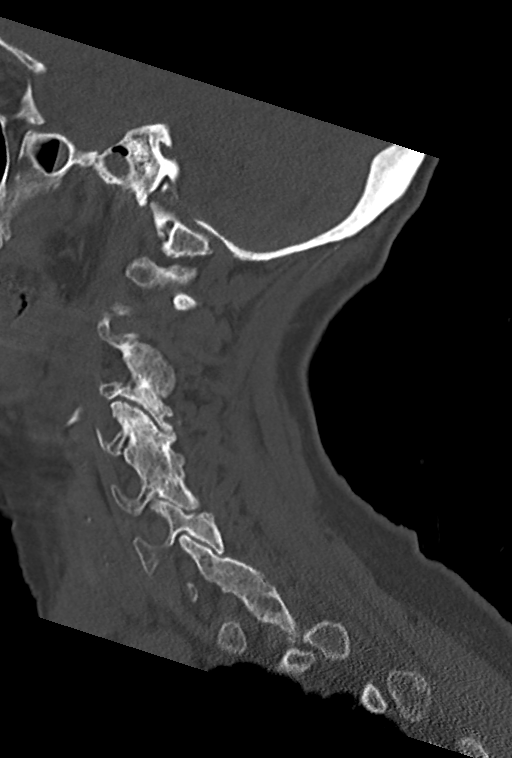
[im 30/72  bone]
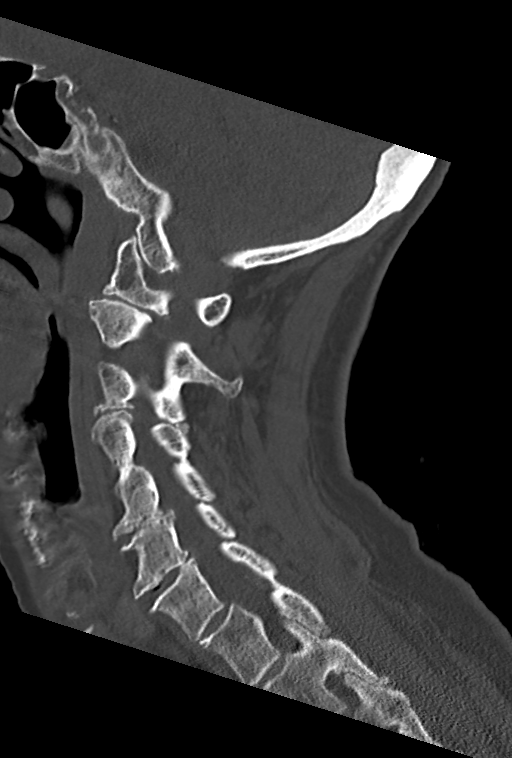
[im 36/72  soft-tissue]
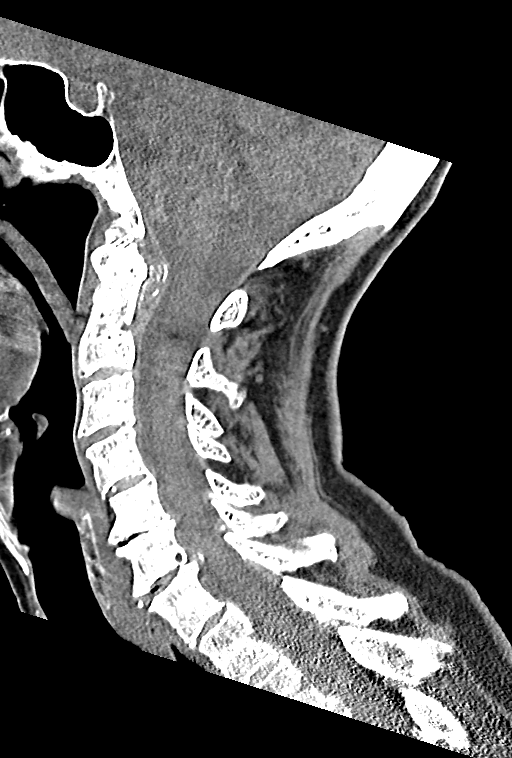
[im 36/72  bone]
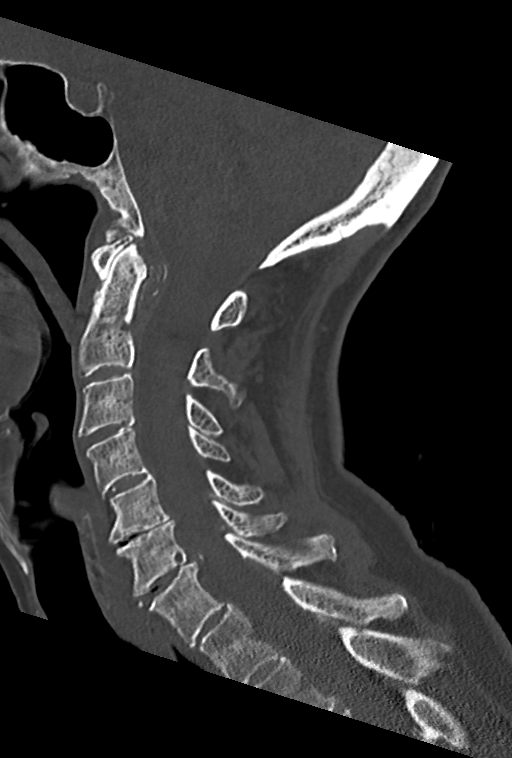
[im 42/72  bone]
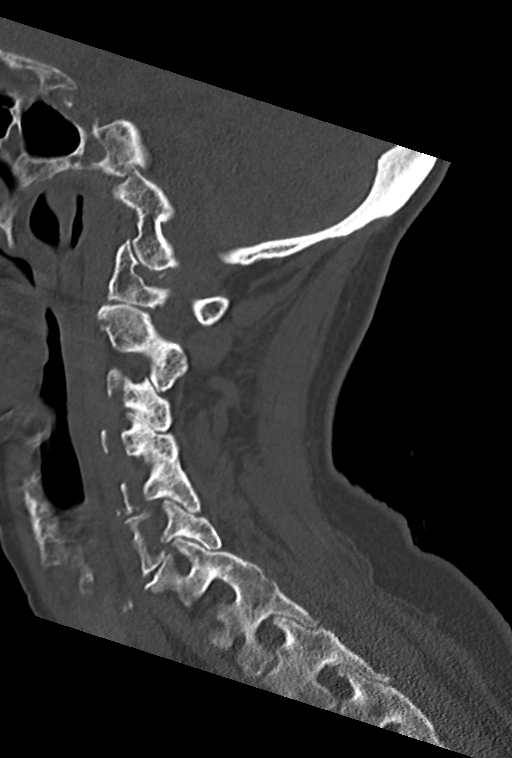
[im 48/72  bone]
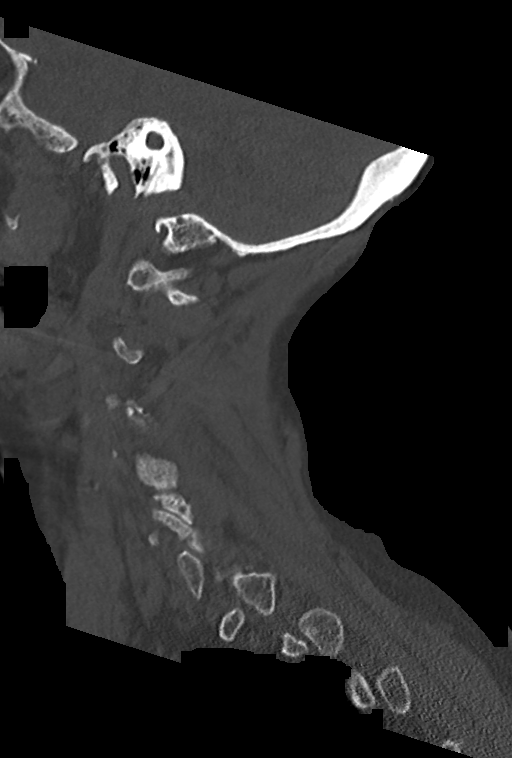

[13 of 33 positions shown; findings below may reference images not displayed]

FINDINGS: CT HEAD FINDINGS

Brain: No evidence of acute infarction, hemorrhage, hydrocephalus,
extra-axial collection or mass lesion/mass effect. Extensive
periventricular and deep white matter hypodensity. Nonacute lacunar
infarction of the left thalamus (series 2, image 14).

Vascular: No hyperdense vessel or unexpected calcification.

Skull: Normal. Negative for fracture or focal lesion.

Sinuses/Orbits: No acute finding.

Other: None.

CT CERVICAL SPINE FINDINGS

Alignment: Normal.

Skull base and vertebrae: No acute fracture. No primary bone lesion
or focal pathologic process.

Soft tissues and spinal canal: No prevertebral fluid or swelling. No
visible canal hematoma.

Disc levels: Moderate multilevel disc space height loss and
osteophytosis.

Upper chest: Negative.

Other: None.
IMPRESSION: 1. No acute intracranial pathology
2. Extensive small-vessel white matter disease. Nonacute lacunar
infarction of the left thalamus.
3. No fracture or static subluxation of the cervical spine.
4. Moderate multilevel degenerative disc disease of the cervical
spine.

## 2019-05-29 IMAGING — DX DG HAND COMPLETE 3+V*L*
3 series · 3 of 3 positions shown · non-contrast
Comparison: None.

CLINICAL DATA: Fall, LEFT hand pain

EXAM:
LEFT HAND - COMPLETE 3+ VIEW

[hand pa]
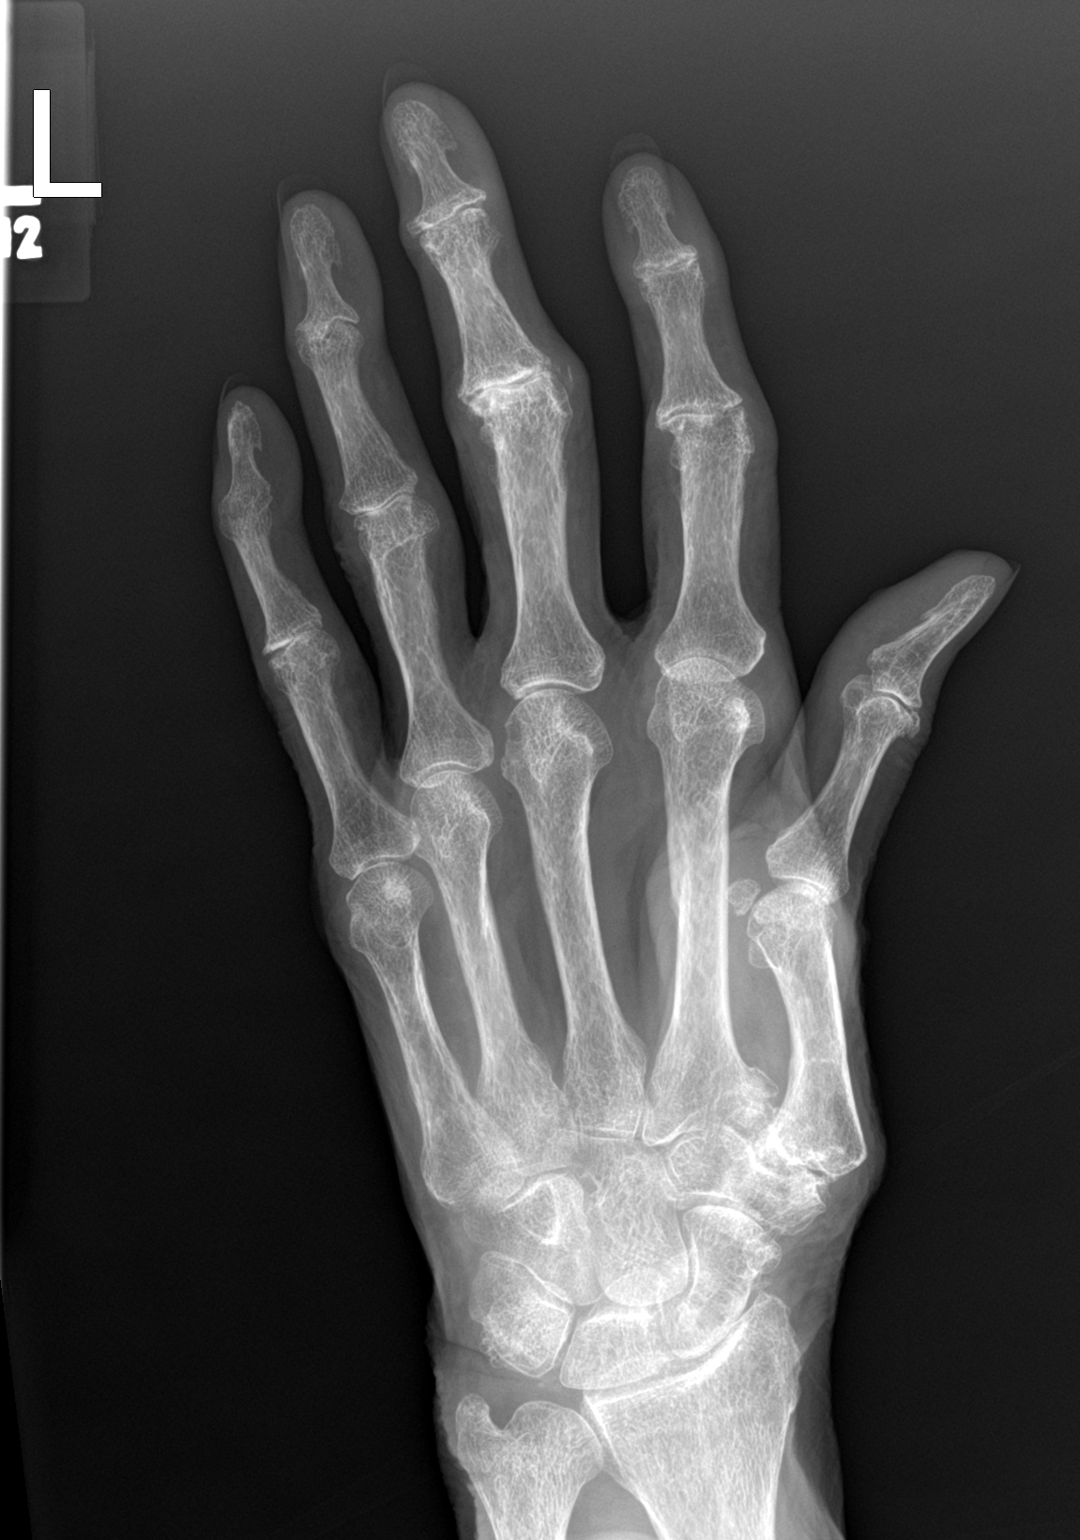

[hand obl]
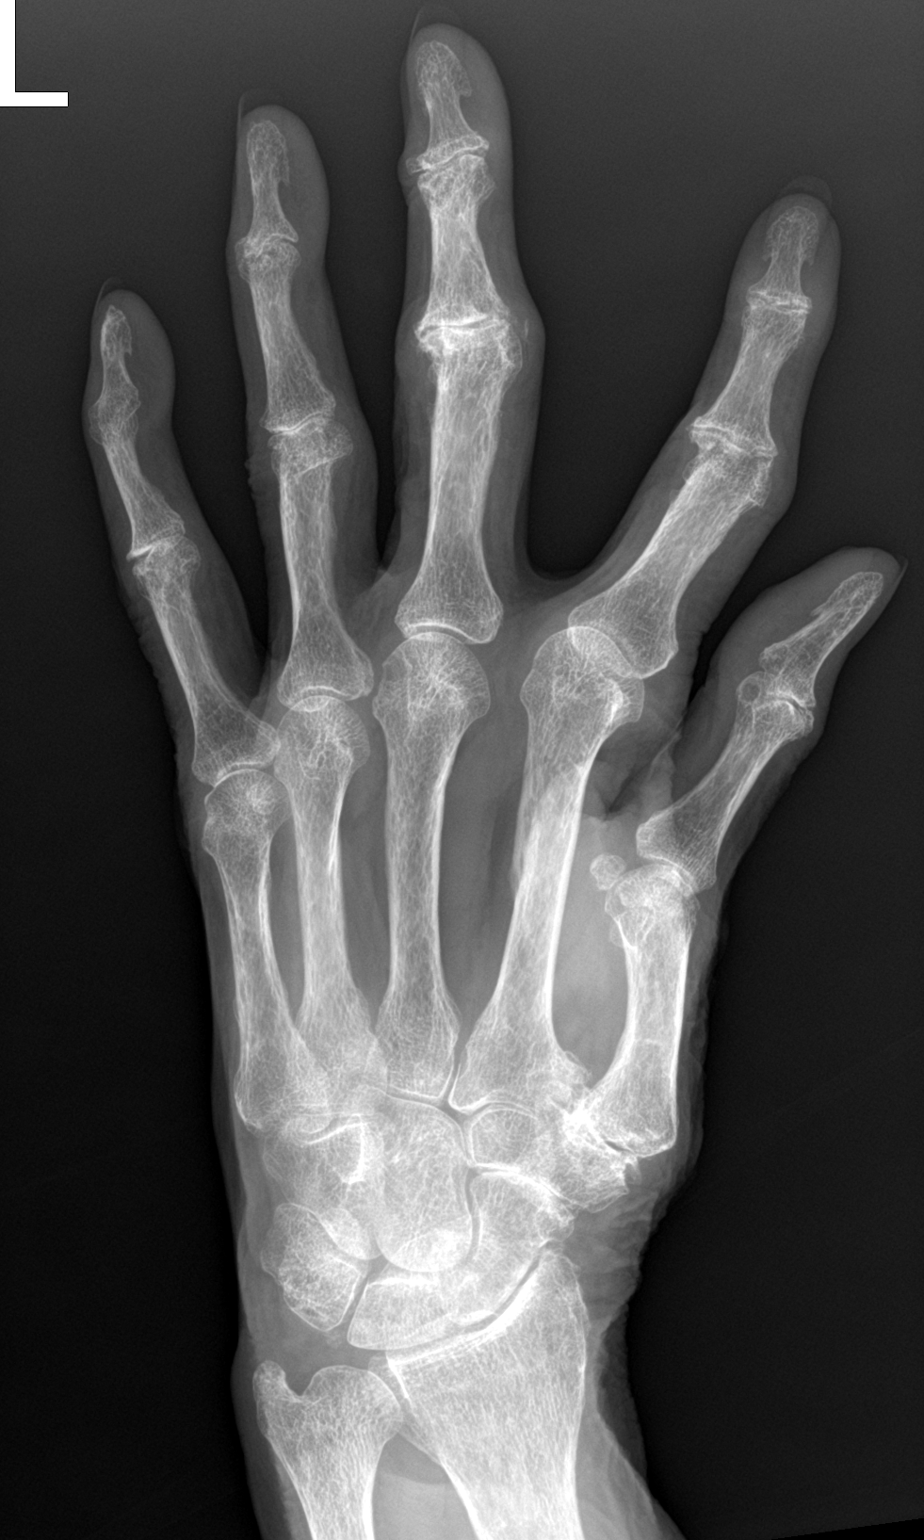

[hand lat]
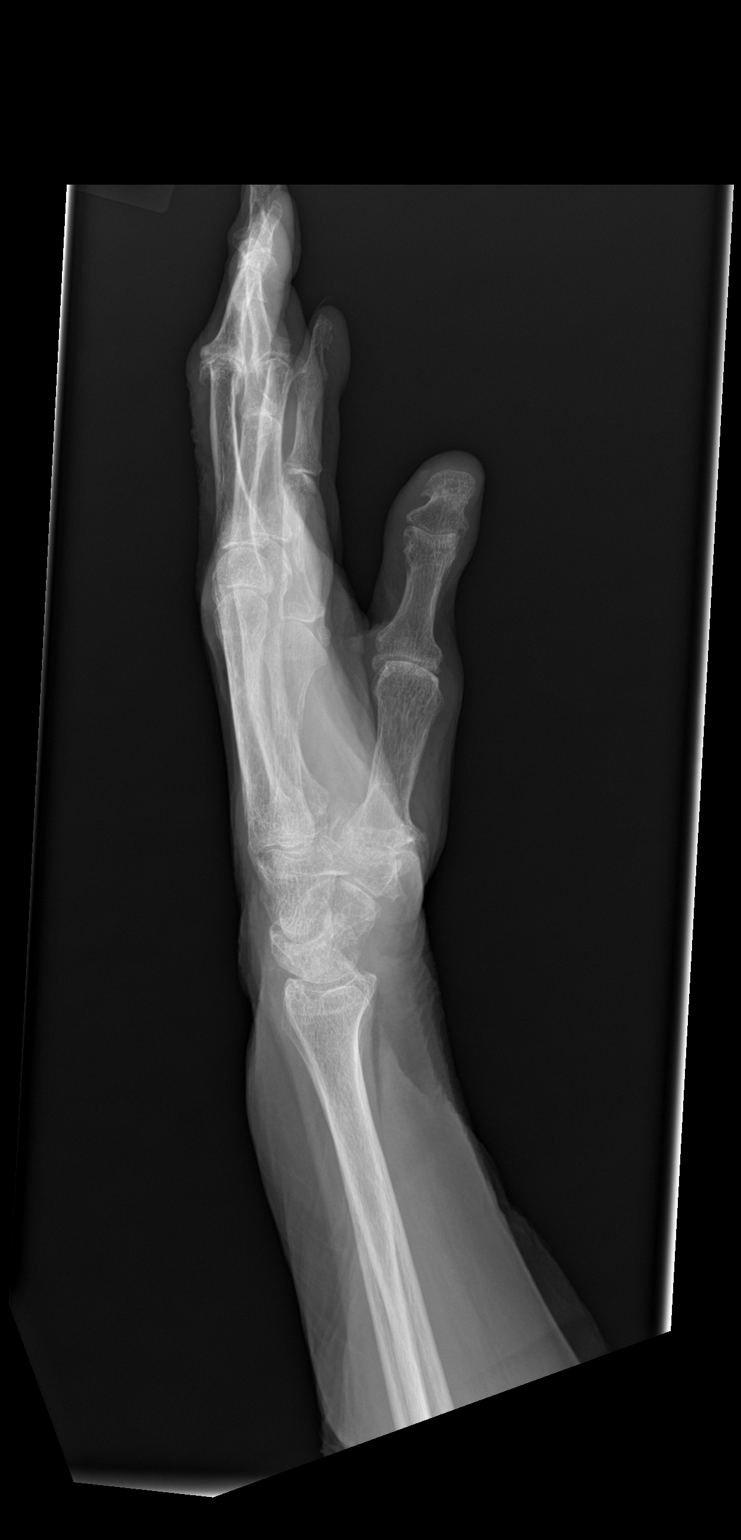

[3 of 3 positions shown; findings below may reference images not displayed]

FINDINGS: No evidence of fracture of the carpal or metacarpal bones.
Radiocarpal joint is intact. Phalanges are normal. No soft tissue
injury.

Osteoarthritis at the proximal interphalangeal joints most severe in
the second and third ray. Osteoarthritis of the first
carpal/metacarpal joint
IMPRESSION: 1. No fracture identified.
2. Osteoarthritis of the proximal interphalangeal joints and first
carpometacarpal joint.

## 2019-05-29 IMAGING — CT CT HEAD W/O CM
3 series · 15 of 47 positions shown, 18 images · non-contrast
Comparison: CT brain, CT angiogram head and neck, [DATE]

CLINICAL DATA: Fall, head injury

EXAM:
CT HEAD WITHOUT CONTRAST
CT CERVICAL SPINE WITHOUT CONTRAST
TECHNIQUE: Multidetector CT imaging of the head and cervical spine was
performed following the standard protocol without intravenous
contrast. Multiplanar CT image reconstructions of the cervical spine
were also generated.

[Series 2: head 5.0 h30s · axial · 0.43mm/px · z∈[-154,-29]mm · 9 of 30 slices shown, 12 images]
[im 3/30  brain]
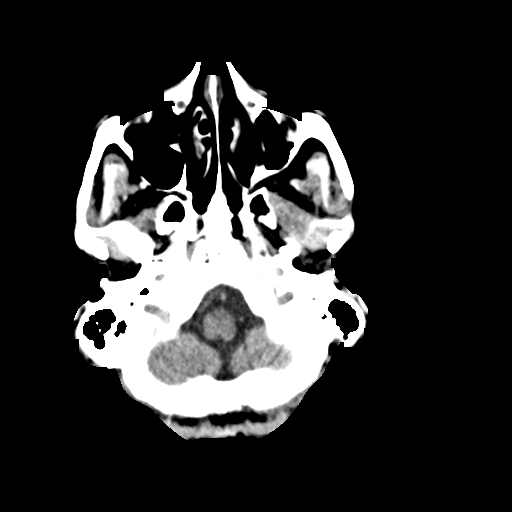
[im 3/30  bone]
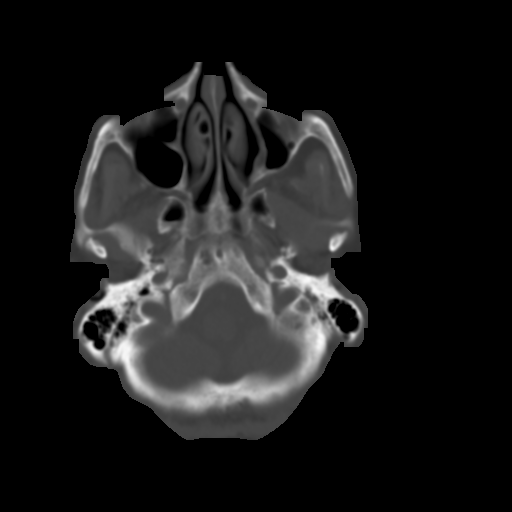
[im 6/30  brain]
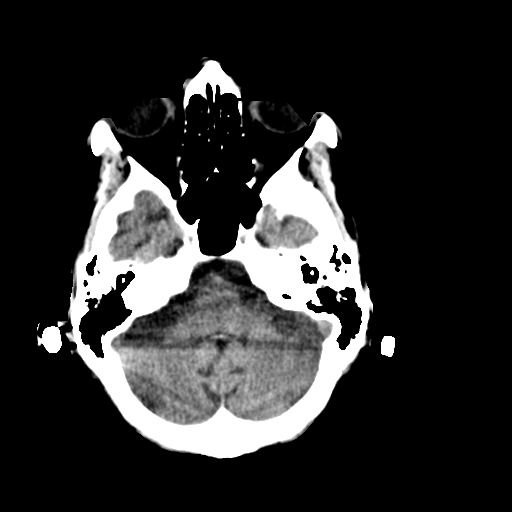
[im 9/30  brain]
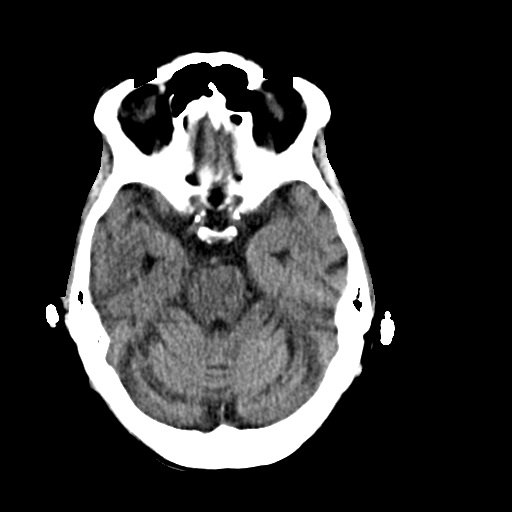
[im 12/30  brain]
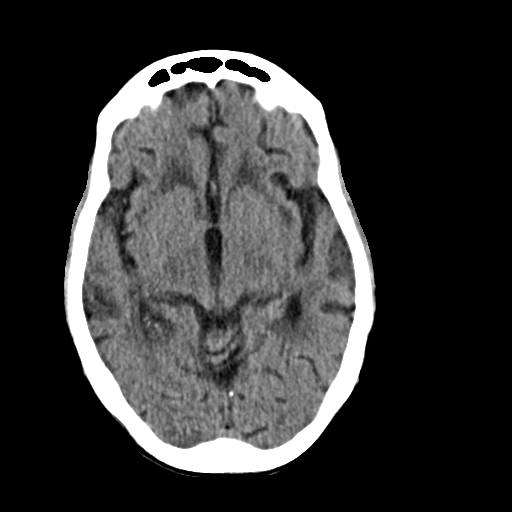
[im 16/30  brain]
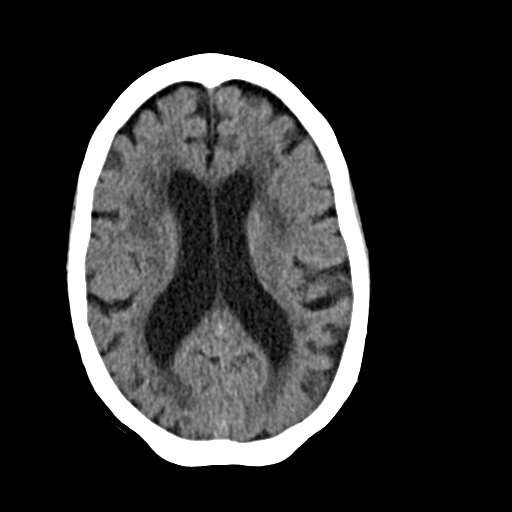
[im 16/30  bone]
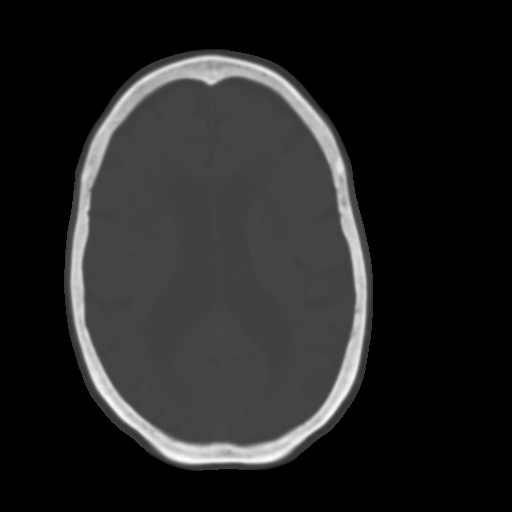
[im 19/30  brain]
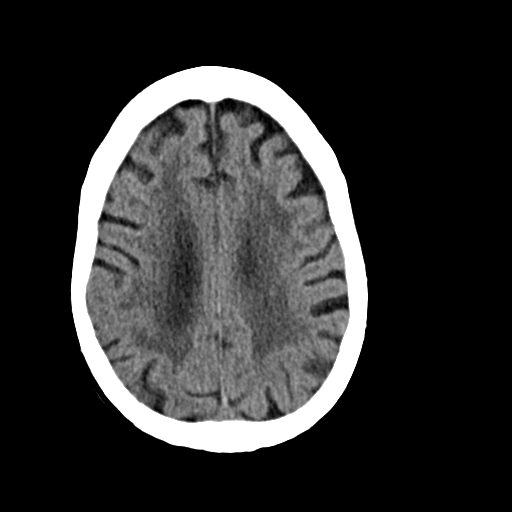
[im 22/30  brain]
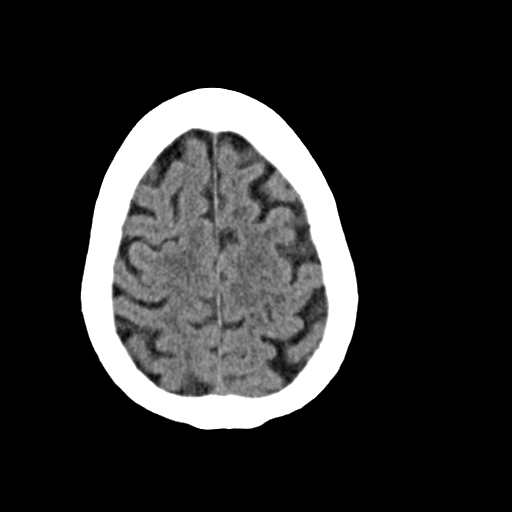
[im 25/30  brain]
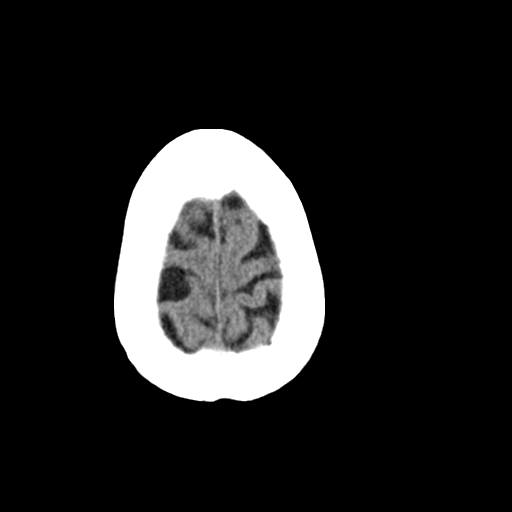
[im 28/30  brain]
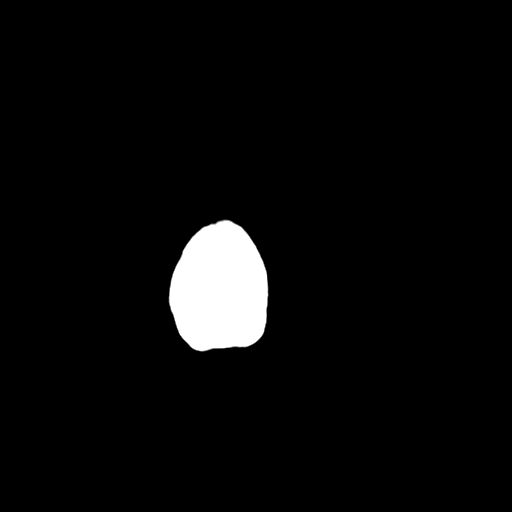
[im 28/30  bone]
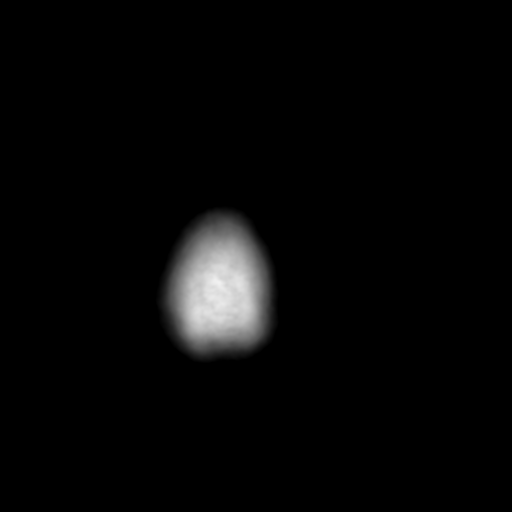

[Series 4: head 3.0 mpr cor · coronal · 0.28mm/px · 3 of 64 slices shown]
[im 22/64  brain]
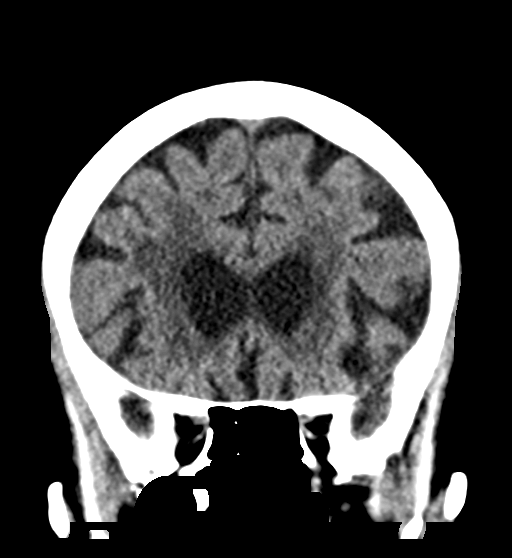
[im 29/64  brain]
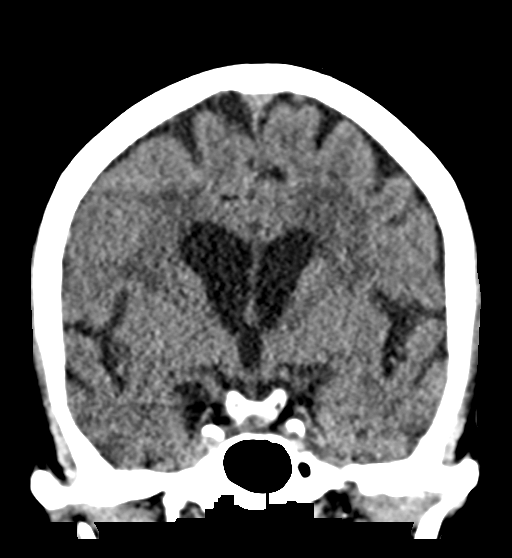
[im 36/64  brain]
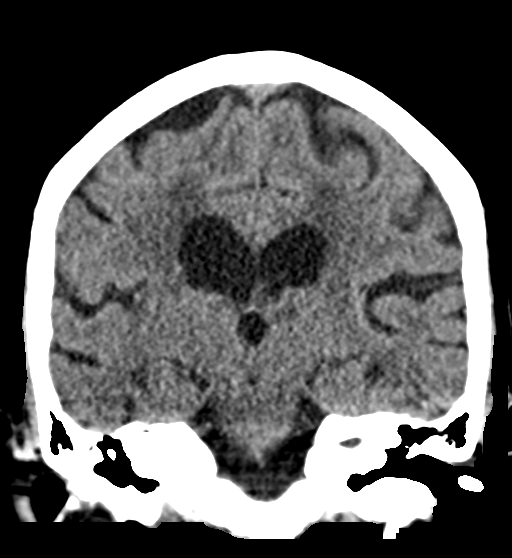

[Series 5: head 3.0 mpr sag · sagittal · 0.30mm/px · 3 of 47 slices shown]
[im 16/47  brain]
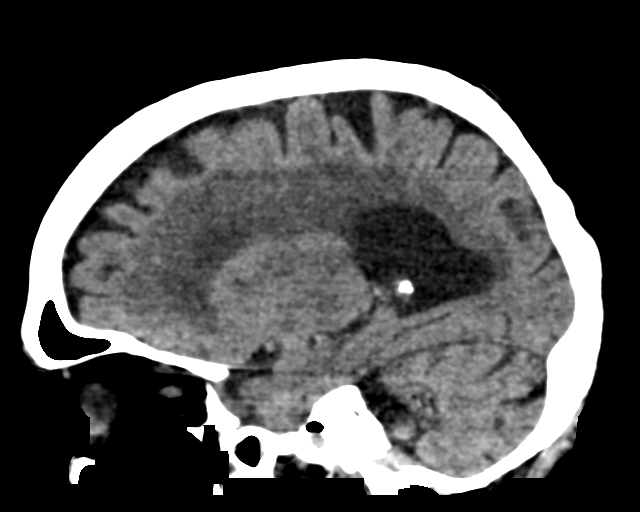
[im 24/47  brain]
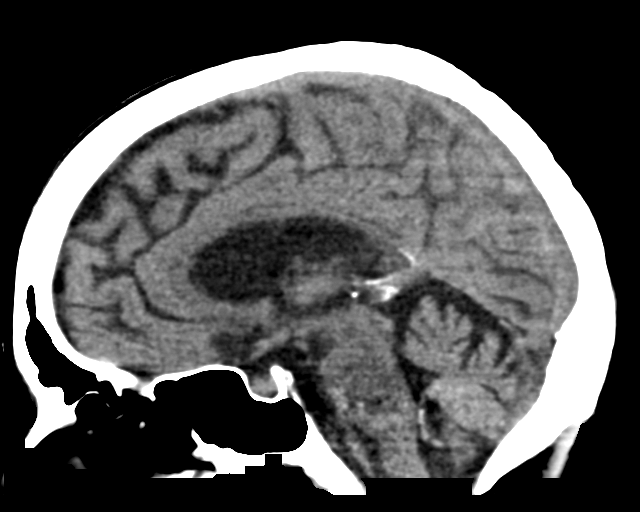
[im 31/47  brain]
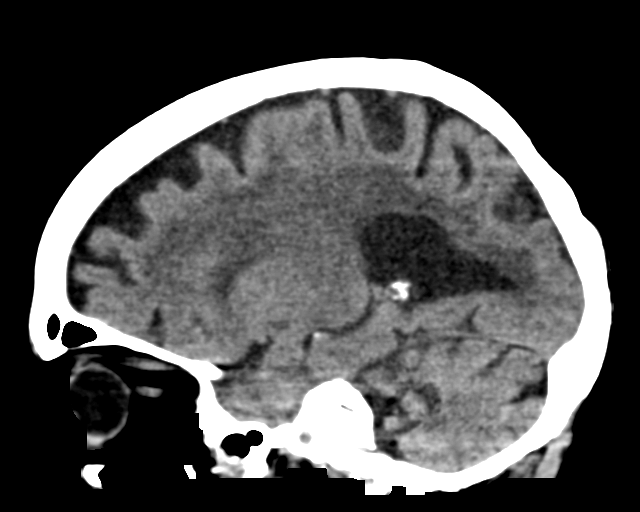

[15 of 47 positions shown; findings below may reference images not displayed]

FINDINGS: CT HEAD FINDINGS

Brain: No evidence of acute infarction, hemorrhage, hydrocephalus,
extra-axial collection or mass lesion/mass effect. Extensive
periventricular and deep white matter hypodensity. Nonacute lacunar
infarction of the left thalamus (series 2, image 14).

Vascular: No hyperdense vessel or unexpected calcification.

Skull: Normal. Negative for fracture or focal lesion.

Sinuses/Orbits: No acute finding.

Other: None.

CT CERVICAL SPINE FINDINGS

Alignment: Normal.

Skull base and vertebrae: No acute fracture. No primary bone lesion
or focal pathologic process.

Soft tissues and spinal canal: No prevertebral fluid or swelling. No
visible canal hematoma.

Disc levels: Moderate multilevel disc space height loss and
osteophytosis.

Upper chest: Negative.

Other: None.
IMPRESSION: 1. No acute intracranial pathology
2. Extensive small-vessel white matter disease. Nonacute lacunar
infarction of the left thalamus.
3. No fracture or static subluxation of the cervical spine.
4. Moderate multilevel degenerative disc disease of the cervical
spine.

## 2019-05-29 IMAGING — DX DG SHOULDER 2+V*L*
2 series · 2 of 2 positions shown · non-contrast
Comparison: None

CLINICAL DATA: Fell injuring LEFT shoulder today, pain, limited
range of motion

EXAM:
LEFT SHOULDER - 2+ VIEW

[shoulder grashey]
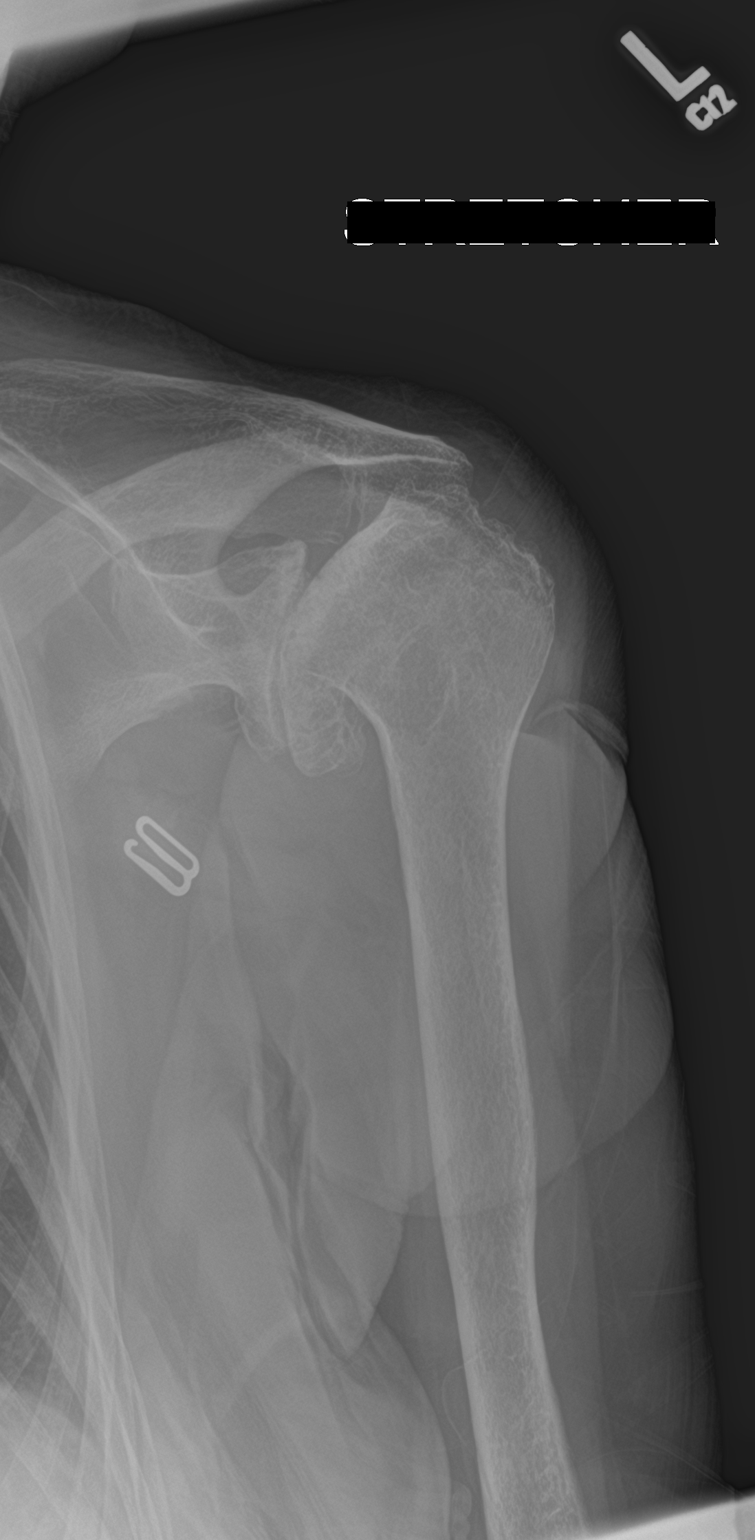

[shoulder y view]
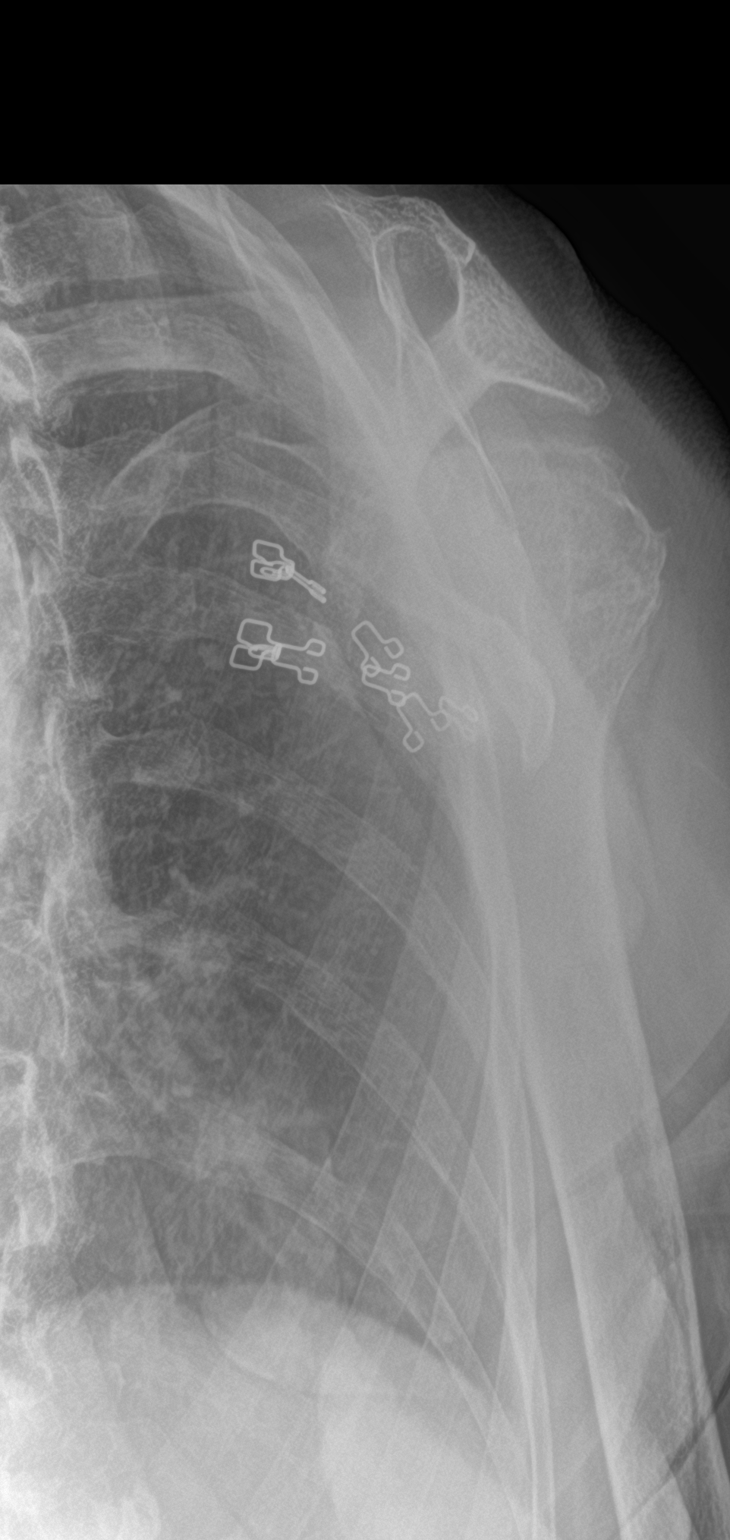

[2 of 2 positions shown; findings below may reference images not displayed]

FINDINGS: Osseous demineralization.

AC joint alignment normal.

Humeral head approximates the undersurface of the acromion
consistent with chronic rotator cuff tear.

Advanced glenohumeral degenerative changes with marked joint space
narrowing, bulky spur formation, and flattening of the humeral head.

No acute fracture, dislocation, or bone destruction.
IMPRESSION: Osseous demineralization with advanced degenerative changes of LEFT
glenohumeral joint.

LEFT chronic rotator cuff tear.

No acute abnormalities.

## 2019-05-29 MED ORDER — LIDOCAINE HCL (PF) 1 % IJ SOLN
5.0000 mL | Freq: Once | INTRAMUSCULAR | Status: AC
  Administered 2019-05-29: 15:00:00 5 mL
  Filled 2019-05-29: qty 5

## 2019-05-29 MED ORDER — TETANUS-DIPHTH-ACELL PERTUSSIS 5-2.5-18.5 LF-MCG/0.5 IM SUSP
0.5000 mL | Freq: Once | INTRAMUSCULAR | Status: AC
  Administered 2019-05-29: 0.5 mL via INTRAMUSCULAR
  Filled 2019-05-29: qty 0.5

## 2019-05-29 NOTE — Discharge Instructions (Signed)
Ms [name] was seen in the ER after a fall  Imaging today did not reveal any significant injuries  Tetanus was updated  6 sutures were placed to close wound. These need to be removed in 7-10 days by primary care provider  The original dressing should be left in place for 24 hours.  If your laceration is small enough, you can remove original dressing after 24 hours after which laceration can be opened to air. Laceration can then be gently cleaned with mild soap and water after 24 hours of laceration repair to prevent crusting over the suture knots. An antibiotic ointment can be applied to the wound as well, twice daily until suture removal. If your laceration is large or can be contaminated you can cover it with a dressing throughout the day.   You may shower or wash the wound with soap and water. Avoid prolonged soaking of stitches including swimming in chlorinated water, pools, hot tubs. Do not swim or soak in natural bodies of water because of a potential increased risk of infection.   Return for swelling, pain, redness, pus, fevers.    I noticed left pupil was larger than right, CT head was normal. The cause of this is unclear, please follow up with eye doctor for re-evaluation

## 2019-05-29 NOTE — ED Provider Notes (Signed)
[facility] EMERGENCY DEPARTMENT Provider Note   CSN: [id number] Arrival date & time: 05/29/19  1359     History Chief Complaint  Patient presents with  . Fall    [name] is a 86 y.o. female presents to the ER from [facility] for evaluation of unwitnessed fall.  History obtained from triage note and from patient.  States he had just left the bathroom and was trying to sit back on her wheelchair when she lost her balance and fell backwards.  She is not sure if she hit her head but thinks she may have hit the left posterior part of her head.  She has a wound with bruising in the left middle finger.  Reports pain in her left shoulder but states this is from "bursitis", she has had injections in this joint before.  Also reports chronic right knee pain since having knee replacement surgery.  Denies any headache.  Denies any loss of consciousness, vomiting, neck pain.  Has not ambulated since the fall.  States she does not think she hurt anything else as far she can tell.  Has been well the last few days and at baseline.  She thinks she is taking blood thinners but is unsure.  At baseline states she usually walks with 1 person assist.  HPI     Past Medical History:  Diagnosis Date  . Adenomatous colon polyp 2007  . Arthritis   . Bilateral carotid bruits 07/27/2018  . CAD (coronary artery disease)   . Chronic back pain   . Gall stone pancreatitis   . Heart murmur   . High cholesterol   . Hx: UTI (urinary tract infection)    Now infrequent  . Hypertension   . Left knee pain Nov. 18, 2014  . Onychomycosis   . Osteoarthritis   . Osteopenia   . PAD (peripheral artery disease) ([place])   . PVD (peripheral vascular disease) ([place])   . Skin ulcer of left heel ([place])   . Trigger finger, right   . Vertigo     Patient Active Problem List   Diagnosis Date Noted  . Stroke (cerebrum) ([place]) 01/17/2019  . PAD (peripheral artery disease) ([place]) 09/27/2018  . Bilateral  carotid bruits 07/27/2018  . Pain in limb-Left Knee 04/04/2013  . Peripheral vascular disease, unspecified ([place]) 04/04/2013  . Choledocholithiasis with obstruction 10/30/2011  . PVD (peripheral vascular disease) ([place]) 10/29/2011  . Pancreatitis 10/28/2011  . Cholelithiasis 10/28/2011  . UTI (urinary tract infection) 10/28/2011  . Hypertension 10/28/2011  . Hypercholesterolemia 10/28/2011    Past Surgical History:  Procedure Laterality Date  . ABDOMINAL HYSTERECTOMY     partial  . CHOLECYSTECTOMY  10/30/2011   Procedure: LAPAROSCOPIC CHOLECYSTECTOMY WITH INTRAOPERATIVE CHOLANGIOGRAM;  Surgeon: Zenovia Jarred, MD;  Location: [facility];  Service: General;  Laterality: N/A;  . ENDARTERECTOMY FEMORAL Left 09/27/2018   Procedure: left Endarterectomy Femoral;  Surgeon: Angelia Mould, MD;  Location: [facility];  Service: Vascular;  Laterality: Left;  . EUS  11/11/2011   Procedure: ESOPHAGEAL ENDOSCOPIC ULTRASOUND (EUS) RADIAL;  Surgeon: Arta Silence, MD;  Location: [facility];  Service: Endoscopy;  Laterality: N/A;  possible ERCP  . EYE SURGERY    . FEMORAL-POPLITEAL BYPASS GRAFT Left 09/27/2018   Procedure: left FEMORAL THROMBECTOMY;  Surgeon: Angelia Mould, MD;  Location: [facility];  Service: Vascular;  Laterality: Left;  . JOINT REPLACEMENT Left 06-22-06   Hip  . JOINT REPLACEMENT Right 11-26-06   Shoulder  .  JOINT REPLACEMENT Right 02-27-06   Knee  . LOWER EXTREMITY ANGIOGRAPHY N/A 09/27/2018   Procedure: LOWER EXTREMITY ANGIOGRAPHY;  Surgeon: Nigel Mormon, MD;  Location: [facility];  Service: Cardiovascular;  Laterality: N/A;  . PATCH ANGIOPLASTY Left 09/27/2018   Procedure: Patch Angioplasty of left femoral artery using xenosure bovine pericardium patch;  Surgeon: Angelia Mould, MD;  Location: [facility];  Service: Vascular;  Laterality: Left;  . PERIPHERAL VASCULAR INTERVENTION  09/27/2018   Procedure: PERIPHERAL VASCULAR INTERVENTION;  Surgeon: Nigel Mormon, MD;  Location: [facility];  Service: Cardiovascular;;  . TOTAL HIP ARTHROPLASTY    . TOTAL KNEE ARTHROPLASTY    . TOTAL SHOULDER ARTHROPLASTY       OB History   No obstetric history on file.     Family History  Problem Relation Age of Onset  . Cancer Mother        ovarian  . Pneumonia Father   . Heart attack Brother   . Heart disease Brother        Heart Disease before age 62  . Healthy Child     Social History   Tobacco Use  . Smoking status: Never Smoker  . Smokeless tobacco: Never Used  Substance Use Topics  . Alcohol use: No    Alcohol/week: 0.0 standard drinks  . Drug use: No    Home Medications Prior to Admission medications   Medication Sig Start Date End Date Taking? Authorizing Provider  acetaminophen (TYLENOL) 650 MG CR tablet Take 650 mg by mouth 3 (three) times daily.    [provider]  amLODipine (NORVASC) 10 MG tablet Take 1 tablet (10 mg total) by mouth daily. 05/18/19 08/16/19  Miquel Dunn, NP  aspirin 81 MG tablet Take 81 mg by mouth daily.    [provider]  cetirizine (ZYRTEC) 10 MG tablet Take 10 mg by mouth daily.    [provider]  losartan (COZAAR) 100 MG tablet Take 100 mg by mouth daily.    [provider]  Maltodextrin-Xanthan Gum (RESOURCE THICKENUP CLEAR) POWD Nectar thick liquids 01/21/19   Rinehuls, Early Chars, PA-C  meclizine (ANTIVERT) 12.5 MG tablet Take 12.5 mg by mouth 3 (three) times daily as needed for dizziness.    [provider]  pravastatin (PRAVACHOL) 20 MG tablet Take 1 tablet (20 mg total) by mouth daily at 6 PM. 01/21/19   Rinehuls, Early Chars, PA-C  rivaroxaban (XARELTO) 2.5 MG TABS tablet Take 1 tablet (2.5 mg total) by mouth 2 (two) times daily. 02/14/19   Miquel Dunn, NP  sertraline (ZOLOFT) 50 MG tablet Take 25 mg by mouth daily.     [provider]  traMADol (ULTRAM) 50 MG tablet Take 25 mg by mouth 3 (three) times daily.     [provider]    Allergies    Hydrochlorothiazide, Penicillins, and Sulfa antibiotics  Review of Systems   Review of Systems  Musculoskeletal: Positive for arthralgias.  Skin: Positive for wound.  Hematological: Bruises/bleeds easily.  All other systems reviewed and are negative.   Physical Exam Updated Vital Signs BP (!) 171/78 (BP Location: Right Arm)   Pulse 90   Temp 98.1 F (36.7 C) (Oral)   Resp 18   SpO2 100%   Physical Exam Vitals and nursing note reviewed.  Constitutional:      General: She is not in acute distress.    Appearance: She is well-developed.     Comments:  Awake, alert.  Pleasant.  HENT:     Head: Normocephalic and atraumatic.     Comments: No signs of facial or scalp tenderness or injury.    Right Ear: External ear normal.     Left Ear: External ear normal.     Nose: Nose normal.     Mouth/Throat:     Comments: No intraoral or tongue injury.  Midface stable. Eyes:     General: No scleral icterus.    Conjunctiva/sclera: Conjunctivae normal.     Pupils: Pupils are unequal.     Comments: Both pupils are round, reactive L larger than R. No facial or periorbital tenderness or signs of trauma. EOM's intact bilaterally   Neck:     Comments: No midline or paraspinal muscle tenderness.  Normal range of motion of the neck without any pain. Cardiovascular:     Rate and Rhythm: Normal rate and regular rhythm.     Heart sounds: Normal heart sounds.  Pulmonary:     Effort: Pulmonary effort is normal.     Breath sounds: Normal breath sounds.     Comments: No tenderness, contusions/skin injury to the A/P/L chest. Abdominal:     Palpations: Abdomen is soft.     Tenderness: There is no abdominal tenderness.     Comments: No tenderness, contusion/skin injury to the A/P/L abdomen  Musculoskeletal:        General: Tenderness present. No deformity. Normal range of motion.     Cervical back: Normal range of motion and neck supple.     Comments: Left hand:  laceration approximately 6 cm along ulnar aspect of left middle finger proximal phalanx, oozing blood and focally tender.  No other TTP on this finger, MCP, other digits or wrist bones. No scaphoid tenderness, negative scaphoid compression test.   Left shoulder: No reproducible bony tenderness, patient reports pain with passive abduction of the shoulder past 90 degrees.  States this is chronic from her bursitis.  Skin is normal over the shoulder joint. Normal non tender elbow with full ROM.  No tenderness over clavicle, scapula.   Right knee: No focal bony tenderness to the right knee, surgical incision noted.  Full flexion/extension without pain.   TL spine: No midline or paraspinal muscle tenderness.  No bruising over TL spine or buttocks.   Pelvis: No tenderness to bony prominences of the hips, pubic symphysis.  Full range of motion of hips without pain.  Negative logroll.  Passive range of motion of all extremities without any reported pain.     Skin:    General: Skin is warm and dry.     Capillary Refill: Capillary refill takes less than 2 seconds.     Findings: Laceration present.     Comments: Irregular approx 6 cm laceration along ulnar aspect of left middle finger as above   Neurological:     Mental Status: She is alert and oriented to person, place, and time.     Comments:   Mental Status: Patient is awake, alert, oriented to full name, hospital, year 2020 and events. Patient is able to give a clear and coherent history.  Speech is fluent and clear without dysarthria or aphasia.  No signs of neglect.  Cranial Nerves: I not tested II visual fields full not tested. PERRL.   III, IV, VI EOMs intact without ptosis or diplopia  V sensation to light touch intact in all 3 divisions of trigeminal nerve bilaterally  VII facial movements symmetric bilaterally VIII hearing intact to  voice/conversation  IX, X no uvula deviation, symmetric rise of soft palate/uvula XI 5/5 SCM and  trapezius strength bilaterally  XII tongue protrusion midline, symmetric L/R movements  Motor: Strength 5/5 in upper/lower extremities .   Sensation to light touch intact in face, upper/lower extremities. No pronator drift. No leg drop.  Cerebellar: No ataxia with finger to nose.   Psychiatric:        Behavior: Behavior normal.        Thought Content: Thought content normal.        Judgment: Judgment normal.     ED Results / Procedures / Treatments   Labs (all labs ordered are listed, but only abnormal results are displayed) Labs Reviewed - No data to display  EKG None  Radiology CT Head Wo Contrast  Result Date: 05/29/2019 CLINICAL DATA:  Fall, head injury EXAM: CT HEAD WITHOUT CONTRAST CT CERVICAL SPINE WITHOUT CONTRAST TECHNIQUE: Multidetector CT imaging of the head and cervical spine was performed following the standard protocol without intravenous contrast. Multiplanar CT image reconstructions of the cervical spine were also generated. COMPARISON:  CT brain, CT angiogram head and neck, 01/17/2019 FINDINGS: CT HEAD FINDINGS Brain: No evidence of acute infarction, hemorrhage, hydrocephalus, extra-axial collection or mass lesion/mass effect. Extensive periventricular and deep white matter hypodensity. Nonacute lacunar infarction of the left thalamus (series 2, image 14). Vascular: No hyperdense vessel or unexpected calcification. Skull: Normal. Negative for fracture or focal lesion. Sinuses/Orbits: No acute finding. Other: None. CT CERVICAL SPINE FINDINGS Alignment: Normal. Skull base and vertebrae: No acute fracture. No primary bone lesion or focal pathologic process. Soft tissues and spinal canal: No prevertebral fluid or swelling. No visible canal hematoma. Disc levels: Moderate multilevel disc space height loss and osteophytosis. Upper chest: Negative. Other: None. IMPRESSION: 1. No acute intracranial pathology 2. Extensive small-vessel white matter disease. Nonacute lacunar  infarction of the left thalamus. 3. No fracture or static subluxation of the cervical spine. 4. Moderate multilevel degenerative disc disease of the cervical spine. Electronically Signed   By: Eddie Candle M.D.   On: 05/29/2019 15:35   CT Cervical Spine Wo Contrast  Result Date: 05/29/2019 CLINICAL DATA:  Fall, head injury EXAM: CT HEAD WITHOUT CONTRAST CT CERVICAL SPINE WITHOUT CONTRAST TECHNIQUE: Multidetector CT imaging of the head and cervical spine was performed following the standard protocol without intravenous contrast. Multiplanar CT image reconstructions of the cervical spine were also generated. COMPARISON:  CT brain, CT angiogram head and neck, 01/17/2019 FINDINGS: CT HEAD FINDINGS Brain: No evidence of acute infarction, hemorrhage, hydrocephalus, extra-axial collection or mass lesion/mass effect. Extensive periventricular and deep white matter hypodensity. Nonacute lacunar infarction of the left thalamus (series 2, image 14). Vascular: No hyperdense vessel or unexpected calcification. Skull: Normal. Negative for fracture or focal lesion. Sinuses/Orbits: No acute finding. Other: None. CT CERVICAL SPINE FINDINGS Alignment: Normal. Skull base and vertebrae: No acute fracture. No primary bone lesion or focal pathologic process. Soft tissues and spinal canal: No prevertebral fluid or swelling. No visible canal hematoma. Disc levels: Moderate multilevel disc space height loss and osteophytosis. Upper chest: Negative. Other: None. IMPRESSION: 1. No acute intracranial pathology 2. Extensive small-vessel white matter disease. Nonacute lacunar infarction of the left thalamus. 3. No fracture or static subluxation of the cervical spine. 4. Moderate multilevel degenerative disc disease of the cervical spine. Electronically Signed   By: Eddie Candle M.D.   On: 05/29/2019 15:35   DG Shoulder Left  Result Date: 05/29/2019 CLINICAL DATA:  [place] injuring LEFT shoulder  today, pain, limited range of motion EXAM:  LEFT SHOULDER - 2+ VIEW COMPARISON:  None FINDINGS: Osseous demineralization. AC joint alignment normal. Humeral head approximates the undersurface of the acromion consistent with chronic rotator cuff tear. Advanced glenohumeral degenerative changes with marked joint space narrowing, bulky spur formation, and flattening of the humeral head. No acute fracture, dislocation, or bone destruction. IMPRESSION: Osseous demineralization with advanced degenerative changes of LEFT glenohumeral joint. LEFT chronic rotator cuff tear. No acute abnormalities. Electronically Signed   By: Lavonia Dana M.D.   On: 05/29/2019 15:53   DG Hand Complete Left  Result Date: 05/29/2019 CLINICAL DATA:  Fall, LEFT hand pain EXAM: LEFT HAND - COMPLETE 3+ VIEW COMPARISON:  None. FINDINGS: No evidence of fracture of the carpal or metacarpal bones. Radiocarpal joint is intact. Phalanges are normal. No soft tissue injury. Osteoarthritis at the proximal interphalangeal joints most severe in the second and third ray. Osteoarthritis of the first carpal/metacarpal joint IMPRESSION: 1. No fracture identified. 2. Osteoarthritis of the proximal interphalangeal joints and first carpometacarpal joint. Electronically Signed   By: Suzy Bouchard M.D.   On: 05/29/2019 15:54    Procedures .Marland KitchenLaceration Repair  Date/Time: 05/29/2019 6:19 PM Performed by: Kinnie Feil, PA-C Authorized by: Kinnie Feil, PA-C   Consent:    Consent obtained:  Verbal   Consent given by:  Patient   Risks discussed:  Infection, need for additional repair, pain, poor cosmetic result and poor wound healing   Alternatives discussed:  No treatment and delayed treatment Universal protocol:    Procedure explained and questions answered to patient or proxy's satisfaction: yes     Relevant documents present and verified: yes     Test results available and properly labeled: yes     Imaging studies available: yes     Required blood products, implants, devices,  and special equipment available: yes     Site/side marked: yes     Immediately prior to procedure, a time out was called: yes     Patient identity confirmed:  Verbally with patient and hospital-assigned identification number Anesthesia (see MAR for exact dosages):    Anesthesia method:  Local infiltration   Local anesthetic:  Lidocaine 1% w/o epi Laceration details:    Location:  Finger   Finger location:  L long finger   Length (cm):  6 Repair type:    Repair type:  Intermediate Pre-procedure details:    Preparation:  Patient was prepped and draped in usual sterile fashion and imaging obtained to evaluate for foreign bodies Exploration:    Hemostasis achieved with:  Direct pressure   Contaminated: no   Treatment:    Area cleansed with:  Betadine and saline (peroxide)   Amount of cleaning:  Extensive   Irrigation solution: saline, peroxide, iodine mix solution.   Irrigation volume:  500 cc    Irrigation method:  Pressure wash, syringe and tap   Visualized foreign bodies/material removed: no   Skin repair:    Repair method:  Sutures   Suture size:  4-0   Suture technique:  Simple interrupted   Number of sutures:  6 Approximation:    Approximation:  Close (proximal end of laceration was closely approximated, distal end of laceration was irregularly shaped and thin skin made it impossible to completely close. laceration was hemostatic after suture repair) Post-procedure details:    Dressing:  Antibiotic ointment, non-adherent dressing and bulky dressing   Patient tolerance of procedure:  Tolerated well, no immediate complications   (  including critical care time)  Medications Ordered in ED Medications  lidocaine (PF) (XYLOCAINE) 1 % injection 5 mL (5 mLs Infiltration Given by Other 05/29/19 1455)  Tdap (BOOSTRIX) injection 0.5 mL (0.5 mLs Intramuscular Given 05/29/19 1451)    ED Course  I have reviewed the triage vital signs and the nursing notes.  Pertinent labs & imaging  results that were available during my care of the patient were reviewed by me and considered in my medical decision making (see chart for details).  Clinical Course as of May 28 1948  Mon May 29, 2019  1620 IMPRESSION: Osseous demineralization with advanced degenerative changes of LEFT glenohumeral joint.  LEFT chronic rotator cuff tear.  No acute abnormalities.  DG Shoulder Left [CG]  1620 IMPRESSION: 1. No fracture identified. 2. Osteoarthritis of the proximal interphalangeal joints and first carpometacarpal joint.  DG Hand Complete Left [CG]    Clinical Course User Index [CG] Arlean Hopping   MDM Rules/Calculators/A&P                      86 year old female here after a fall at facility.  Obtained collateral information from daughter who states this was a witnessed fall and caregiver was with her.  Confirms patient's story that she was trying to position herself in her wheelchair and lost her balance and fell backwards.  Physical exam is reassuring.  There is irregular laceration in the left middle finger which was repaired here without immediate complications.  X-ray was nonacute.  No signs of significant vessel or significant soft tissue injury.  Digit is neurovascularly intact.  No signs of proximal or distal injury.  No wrist tenderness or scaphoid tenderness.  Patient reported left shoulder pain but reports this is chronic from arthritis, daughter confirms this.  X-ray of shoulder is nonacute.  Head CT and cervical spine negative.  Tetanus was updated.  Wound was thoroughly irrigated by me.  Asymmetry of pupils noted today.  I spoke to patient and daughter about this, they state this is not something they are aware of.  They cannot remember but states patient has had cataract surgery on one of her eyes and surgery to remove cancer from the corner of one of her eyes but they do not know which one.  No signs of globe, eyelid or periorbital injury.  Head CT is negative.   Patient reports her vision is normal.  EOMs normal.  Grossly neurologically intact as above.  Will DC with wound care instructions, suture removal instructions.  Recommended ophthalmology evaluation of pupil asymmetry.  Discussed with Dr. Bobby Rumpf who agrees with recommendations.  Final Clinical Impression(s) / ED Diagnoses Final diagnoses:  Fall, initial encounter  Laceration of left middle finger without foreign body without damage to nail, initial encounter  Pupil asymmetry    Rx / DC Orders ED Discharge Orders    None       Kinnie Feil, PA-C 05/29/19 [place], [place], DO 05/30/19 [phone]

## 2019-05-29 NOTE — ED Notes (Signed)
Per pt's request a silver colored wedding band and engagement ring with clear stones was removed from her hand and given to her daughter Loletha Carrow in a specimen cup.

## 2019-05-29 NOTE — ED Notes (Signed)
Patient transported to CT 

## 2019-05-29 NOTE — ED Notes (Signed)
PT ambulated slowly with walker but no other assistance. PT reported stiffness from bed but was able to take a few steps on her own with the walker

## 2019-05-29 NOTE — ED Triage Notes (Signed)
Per EMS:  Pt at [facility].  Fall at facility.  Unknown witnessed.  Denied hitting head.  Pt has laceration to left hand.

## 2019-05-30 DIAGNOSIS — Z9181 History of falling: Secondary | ICD-10-CM | POA: Diagnosis not present

## 2019-05-30 DIAGNOSIS — R54 Age-related physical debility: Secondary | ICD-10-CM | POA: Diagnosis not present

## 2019-05-30 DIAGNOSIS — R293 Abnormal posture: Secondary | ICD-10-CM | POA: Diagnosis not present

## 2019-05-30 DIAGNOSIS — H5702 Anisocoria: Secondary | ICD-10-CM | POA: Diagnosis not present

## 2019-05-30 DIAGNOSIS — S61313D Laceration without foreign body of left middle finger with damage to nail, subsequent encounter: Secondary | ICD-10-CM | POA: Diagnosis not present

## 2019-06-02 DIAGNOSIS — R293 Abnormal posture: Secondary | ICD-10-CM | POA: Diagnosis not present

## 2019-06-07 DIAGNOSIS — R293 Abnormal posture: Secondary | ICD-10-CM | POA: Diagnosis not present

## 2019-06-12 DIAGNOSIS — R293 Abnormal posture: Secondary | ICD-10-CM | POA: Diagnosis not present

## 2019-06-13 DIAGNOSIS — Z23 Encounter for immunization: Secondary | ICD-10-CM | POA: Diagnosis not present

## 2019-06-16 DIAGNOSIS — R293 Abnormal posture: Secondary | ICD-10-CM | POA: Diagnosis not present

## 2019-06-29 ENCOUNTER — Telehealth: Payer: Self-pay

## 2019-06-29 NOTE — Telephone Encounter (Signed)
She is going to have some leg swelling and that is okay. I would still just recommend elevating her feet and limiting the amount of time she is sitting in the wheelchair if the facility is able. I will be limited on medications to help with her leg swelling due to her history of dropping her sodium levels. If they feel that the leg swelling has gotten worse or they are really concerned about it, I am happy to see her sooner.

## 2019-06-29 NOTE — Telephone Encounter (Signed)
Pt's daughter [name] made aware.//ah

## 2019-06-29 NOTE — Telephone Encounter (Signed)
Pt's daughter [name] called stating that pt is still having on/off LE Edema. Pt has pending appt 3/31. [name] wants to know if it is safe to wait til appt or move it up and also if any meds needs to be added.//ah

## 2019-07-07 DIAGNOSIS — F339 Major depressive disorder, recurrent, unspecified: Secondary | ICD-10-CM | POA: Diagnosis not present

## 2019-07-07 DIAGNOSIS — I739 Peripheral vascular disease, unspecified: Secondary | ICD-10-CM | POA: Diagnosis not present

## 2019-07-07 DIAGNOSIS — I10 Essential (primary) hypertension: Secondary | ICD-10-CM | POA: Diagnosis not present

## 2019-07-07 DIAGNOSIS — R54 Age-related physical debility: Secondary | ICD-10-CM | POA: Diagnosis not present

## 2019-07-18 NOTE — Progress Notes (Signed)
Virtual Visit via Video Note The purpose of this virtual visit is to provide medical care while limiting exposure to the novel coronavirus.    Consent was obtained for video visit:  Yes.   Answered questions that patient had about telehealth interaction:  Yes.   I discussed the limitations, risks, security and privacy concerns of performing an evaluation and management service by telemedicine. I also discussed with the patient that there may be a patient responsible charge related to this service. The patient expressed understanding and agreed to proceed.  Pt location: Home Physician Location: office Name of referring provider:  Lajean Manes, MD I connected with [name] at patients initiation/request on 07/19/2019 at 10:30 AM EST by video enabled telemedicine application and verified that I am speaking with the correct person using two identifiers. Pt MRN:  [id number] Pt DOB:  [birth year] Video Participants:  [name];  Nurse; daughter (via phone)   History of Present Illness:  [name] is an 86 year old white female with CAD, HTN, dementia, questionable A fib who follows up for TIA.    UPDATE: Current medications:  Xarelto, ASA 81mg , sertraline 50mg  daily, tramadol, amlodipine, pravastatin 20mg  , losartan.   She is currently in assisted living.  She has been depressed.  She wants to go back home.  She has been more withdrawn from participating in activities.  The staff try to encourage her to leave her room.  She is scheduled to lead bible study.  Appetite is good.  She did have a fall in January in which she lacerated her finger.  She went to the ED.  CT head and cervical spine personally reviewed revealed no acute abnormality.   HISTORY: She was admitted to [facility] on [facility]/17/2019 presenting with sudden onset confusion with right sided weakness and aphasia.  In ED, CT head was negative for blled and she was treated with IV tPA.  CTA of head and neck  demonstrated on large vessel occlusion or significant stenosis.  She had few runs of tachycardia in ED but telemetry negative for atrial fibrillation.  MRI of brain was negative for acute stroke.  Echocardiogram showed EF >65% with severe aortic annular calcification but no source of emboli.  LDL was 61 and Hgb A1c was 5.4.  Due to confusion, she underwent EEG, which showed generalized theta-delta slowing indicative of moderate diffuse encephalopathy.    UA was positive for UTI, which was thought to be the cause of her altered mental status.  However, her daughter didn't think that her symptoms were due to the UTI because she has had prior UTIs in the past and never demonstrated these symptoms.  She was discharged on both ASA and Plavix.  Her cardiologist wanted to hook her up to a cardiac event monitor but it would require 2 weeks quarantine, so he discontinued Plavix and just went ahead and started her on Xarelto.    Her daughter reports that she has had more difficulty swallowing since the stroke.  Her memory has been worse.  She does not remember conversations or she is repeating questions.  She has trouble with some instruction which may be affected by her hearing loss.  She has been more irritable with angry outbursts since the hospitalization.  She has been a little more paranoia.  She has some days when she is confused.   She needs assistance with dressing, using toilet and bathing, some of it is compromised by arthritis.  She needs  help with transferring (getting in and out of bed and into the bathroom).   She feels lonely.   She is upset about having to be in assisted living facility.  Prior to hospitalization, she has had memory deficits and irritability, thought to be onset of dementia.  She needs help with managing medications.  She is anxious.  However, it is worse since the stroke.   She previously took Aricept and eventually refused to take it.     Past Medical History: Past Medical  History:  Diagnosis Date  . Adenomatous colon polyp 2007  . Arthritis   . Bilateral carotid bruits 07/27/2018  . CAD (coronary artery disease)   . Chronic back pain   . Gall stone pancreatitis   . Heart murmur   . High cholesterol   . Hx: UTI (urinary tract infection)    Now infrequent  . Hypertension   . Left knee pain Nov. 18, 2014  . Onychomycosis   . Osteoarthritis   . Osteopenia   . PAD (peripheral artery disease) ([place])   . PVD (peripheral vascular disease) ([place])   . Skin ulcer of left heel ([place])   . Trigger finger, right   . Vertigo     Medications: Outpatient Encounter Medications as of 07/19/2019  Medication Sig  . acetaminophen (TYLENOL) 650 MG CR tablet Take 650 mg by mouth 3 (three) times daily.  Marland Kitchen amLODipine (NORVASC) 10 MG tablet Take 1 tablet (10 mg total) by mouth daily.  Marland Kitchen aspirin 81 MG tablet Take 81 mg by mouth daily.  . cetirizine (ZYRTEC) 10 MG tablet Take 10 mg by mouth daily.  . Cholecalciferol 25 MCG (1000 UT) tablet 2,000 Units.   Marland Kitchen losartan (COZAAR) 100 MG tablet Take 100 mg by mouth daily.  . pravastatin (PRAVACHOL) 20 MG tablet Take 1 tablet (20 mg total) by mouth daily at 6 PM.  . rivaroxaban (XARELTO) 2.5 MG TABS tablet Take 1 tablet (2.5 mg total) by mouth 2 (two) times daily.  . sertraline (ZOLOFT) 50 MG tablet Take 25 mg by mouth daily.   . traMADol (ULTRAM) 50 MG tablet Take 25 mg by mouth 3 (three) times daily.   . Maltodextrin-Xanthan Gum (RESOURCE THICKENUP CLEAR) POWD Nectar thick liquids  . meclizine (ANTIVERT) 12.5 MG tablet Take 12.5 mg by mouth 3 (three) times daily as needed for dizziness.   No facility-administered encounter medications on file as of 07/19/2019.    Allergies: Allergies  Allergen Reactions  . Hydrochlorothiazide Other (See Comments)    Dropped sodium level  . Penicillins     Unknown  Did it involve swelling of the face/tongue/throat, SOB, or low BP? Unknown Did it involve sudden or severe rash/hives, skin  peeling, or any reaction on the inside of your mouth or nose? Unknown Did you need to seek medical attention at a hospital or doctor's office? Unknown When did it last happen?unknown If all above answers are "NO", may proceed with cephalosporin use.   . Sulfa Antibiotics     unknown    Family History: Family History  Problem Relation Age of Onset  . Cancer Mother        ovarian  . Pneumonia Father   . Heart attack Brother   . Heart disease Brother        Heart Disease before age 43  . Healthy Child     Social History: Social History   Socioeconomic History  . Marital status: Widowed    Spouse name: Not on  file  . Number of children: 5  . Years of education: College  . Highest education level: Some college, no degree  Occupational History  . Not on file  Tobacco Use  . Smoking status: Never Smoker  . Smokeless tobacco: Never Used  Substance and Sexual Activity  . Alcohol use: No    Alcohol/week: 0.0 standard drinks  . Drug use: No  . Sexual activity: Not on file  Other Topics Concern  . Not on file  Social History Narrative   No caffeine use   Social Determinants of Health   Financial Resource Strain:   . Difficulty of Paying Living Expenses: Not on file  Food Insecurity:   . Worried About Charity fundraiser in the Last Year: Not on file  . Ran Out of Food in the Last Year: Not on file  Transportation Needs:   . Lack of Transportation (Medical): Not on file  . Lack of Transportation (Non-Medical): Not on file  Physical Activity:   . Days of Exercise per Week: Not on file  . Minutes of Exercise per Session: Not on file  Stress:   . Feeling of Stress : Not on file  Social Connections:   . Frequency of Communication with Friends and Family: Not on file  . Frequency of Social Gatherings with Friends and Family: Not on file  . Attends Religious Services: Not on file  . Active Member of Clubs or Organizations: Not on file  . Attends Theatre manager Meetings: Not on file  . Marital Status: Not on file  Intimate Partner Violence:   . Fear of Current or Ex-Partner: Not on file  . Emotionally Abused: Not on file  . Physically Abused: Not on file  . Sexually Abused: Not on file    Observations/Objective:   Height 5\' 2"  (1.575 m), weight 136 lb (61.7 kg). No acute distress.  Alert and oriented.  Speech fluent and not dysarthric.  Language intact.  Eyes orthophoric on primary gaze.  Face symmetric.  Assessment and Plan:   1.  Left hemispheric transient ischemic attack presenting with right sided weakness and aphasia 2.  Major neurocognitive disorder, likely mixed vascular and Alzheimer's 3.  Questionable atrial fibrillation 4.  Hypertension 5.  Hyperlipidemia 6.  Depression/anxiety 7.  Peripheral arterial/vascular disease  1.  ASA 81mg  and Xarelto 2.  Statin therapy (LDL goal less than 70) 3.  Blood pressure control 4.  Will increase sertraline to 75mg  daily to help improve mood. 5.  Defer cholinesterase inhibitor for now 6.  Follow up in 6 months.  Follow Up Instructions:    -I discussed the assessment and treatment plan with the patient. The patient was provided an opportunity to ask questions and all were answered. The patient agreed with the plan and demonstrated an understanding of the instructions.   The patient was advised to call back or seek an in-person evaluation if the symptoms worsen or if the condition fails to improve as anticipated.  Total time spent with patient:  25 minutes   Dudley Major, DO

## 2019-07-19 ENCOUNTER — Telehealth (INDEPENDENT_AMBULATORY_CARE_PROVIDER_SITE_OTHER): Payer: Medicare Other | Admitting: Neurology

## 2019-07-19 ENCOUNTER — Other Ambulatory Visit: Payer: Self-pay

## 2019-07-19 ENCOUNTER — Encounter: Payer: Self-pay | Admitting: Neurology

## 2019-07-19 VITALS — Ht 62.0 in | Wt 136.0 lb

## 2019-07-19 DIAGNOSIS — F329 Major depressive disorder, single episode, unspecified: Secondary | ICD-10-CM | POA: Diagnosis not present

## 2019-07-19 DIAGNOSIS — G459 Transient cerebral ischemic attack, unspecified: Secondary | ICD-10-CM

## 2019-07-19 DIAGNOSIS — I69851 Hemiplegia and hemiparesis following other cerebrovascular disease affecting right dominant side: Secondary | ICD-10-CM

## 2019-07-19 DIAGNOSIS — I69820 Aphasia following other cerebrovascular disease: Secondary | ICD-10-CM

## 2019-07-19 DIAGNOSIS — F419 Anxiety disorder, unspecified: Secondary | ICD-10-CM

## 2019-07-19 DIAGNOSIS — F0150 Vascular dementia without behavioral disturbance: Secondary | ICD-10-CM

## 2019-07-19 DIAGNOSIS — I739 Peripheral vascular disease, unspecified: Secondary | ICD-10-CM | POA: Diagnosis not present

## 2019-07-19 DIAGNOSIS — I10 Essential (primary) hypertension: Secondary | ICD-10-CM | POA: Diagnosis not present

## 2019-07-19 DIAGNOSIS — E7800 Pure hypercholesterolemia, unspecified: Secondary | ICD-10-CM

## 2019-07-19 DIAGNOSIS — Z7982 Long term (current) use of aspirin: Secondary | ICD-10-CM | POA: Diagnosis not present

## 2019-07-19 DIAGNOSIS — E785 Hyperlipidemia, unspecified: Secondary | ICD-10-CM

## 2019-07-19 DIAGNOSIS — F32A Depression, unspecified: Secondary | ICD-10-CM

## 2019-07-20 DIAGNOSIS — G8929 Other chronic pain: Secondary | ICD-10-CM | POA: Diagnosis not present

## 2019-07-20 DIAGNOSIS — M1711 Unilateral primary osteoarthritis, right knee: Secondary | ICD-10-CM | POA: Diagnosis not present

## 2019-07-20 DIAGNOSIS — M25561 Pain in right knee: Secondary | ICD-10-CM | POA: Diagnosis not present

## 2019-07-20 DIAGNOSIS — I10 Essential (primary) hypertension: Secondary | ICD-10-CM | POA: Diagnosis not present

## 2019-08-09 ENCOUNTER — Other Ambulatory Visit: Payer: Self-pay

## 2019-08-09 ENCOUNTER — Encounter: Payer: Self-pay | Admitting: Cardiology

## 2019-08-09 ENCOUNTER — Ambulatory Visit: Payer: Medicare Other | Admitting: Cardiology

## 2019-08-09 VITALS — BP 143/80 | HR 79 | Temp 98.0°F | Resp 16 | Ht 60.0 in | Wt 126.0 lb

## 2019-08-09 DIAGNOSIS — I10 Essential (primary) hypertension: Secondary | ICD-10-CM | POA: Diagnosis not present

## 2019-08-09 DIAGNOSIS — I739 Peripheral vascular disease, unspecified: Secondary | ICD-10-CM | POA: Diagnosis not present

## 2019-08-09 DIAGNOSIS — Z8673 Personal history of transient ischemic attack (TIA), and cerebral infarction without residual deficits: Secondary | ICD-10-CM | POA: Diagnosis not present

## 2019-08-09 MED ORDER — HYDRALAZINE HCL 25 MG PO TABS: 25.0000 mg | ORAL_TABLET | Freq: Three times a day (TID) | ORAL | 3 refills | Status: DC

## 2019-08-09 NOTE — Progress Notes (Signed)
Primary Physician:  Javier Glazier, MD   Patient ID: [name], female    DOB: [birth year], 86 y.o.   MRN: [id number]  Subjective:   Chief Complaint  Patient presents with  . PAD  . Follow-up    3 month   HPI: SALINE DEMETRIUS  is a 86 y.o. female  with hypertension, hyperlipidemia, spinal stenosis, PAD with left heel ulceration,  s/p angioplasty to left SFA on 09/27/18 and noted to have dissection due to severely calcified and heavy plaque burden to left common femoral artery after the procedure, in which she had urgent patch angioplasty with Dr. Doren Custard also on 09/27/18.    Patient was admitted on 01/17/19 with right sided weakness, aphasia, and "stroke like" and was given IV TPA; however, neurological imagining was negative for any evidence of acute CVA. She was noted to have UTI and thought to be the culprit as well.   Past Medical History:  Diagnosis Date  . Adenomatous colon polyp 2007  . Arthritis   . Bilateral carotid bruits 07/27/2018  . CAD (coronary artery disease)   . Chronic back pain   . Gall stone pancreatitis   . Heart murmur   . High cholesterol   . Hx: UTI (urinary tract infection)    Now infrequent  . Hypertension   . Left knee pain Nov. 18, 2014  . Onychomycosis   . Osteoarthritis   . Osteopenia   . PAD (peripheral artery disease) ([place])   . PVD (peripheral vascular disease) ([place])   . Skin ulcer of left heel ([place])   . Trigger finger, right   . Vertigo     Past Surgical History:  Procedure Laterality Date  . ABDOMINAL HYSTERECTOMY     partial  . CHOLECYSTECTOMY  10/30/2011   Procedure: LAPAROSCOPIC CHOLECYSTECTOMY WITH INTRAOPERATIVE CHOLANGIOGRAM;  Surgeon: Zenovia Jarred, MD;  Location: [facility];  Service: General;  Laterality: N/A;  . ENDARTERECTOMY FEMORAL Left 09/27/2018   Procedure: left Endarterectomy Femoral;  Surgeon: Angelia Mould, MD;  Location: [facility];  Service: Vascular;  Laterality: Left;  . EUS  11/11/2011   Procedure:  ESOPHAGEAL ENDOSCOPIC ULTRASOUND (EUS) RADIAL;  Surgeon: Arta Silence, MD;  Location: [facility];  Service: Endoscopy;  Laterality: N/A;  possible ERCP  . EYE SURGERY    . FEMORAL-POPLITEAL BYPASS GRAFT Left 09/27/2018   Procedure: left FEMORAL THROMBECTOMY;  Surgeon: Angelia Mould, MD;  Location: [facility];  Service: Vascular;  Laterality: Left;  . JOINT REPLACEMENT Left 06-22-06   Hip  . JOINT REPLACEMENT Right 11-26-06   Shoulder  . JOINT REPLACEMENT Right 02-27-06   Knee  . LOWER EXTREMITY ANGIOGRAPHY N/A 09/27/2018   Procedure: LOWER EXTREMITY ANGIOGRAPHY;  Surgeon: Nigel Mormon, MD;  Location: [facility];  Service: Cardiovascular;  Laterality: N/A;  . PATCH ANGIOPLASTY Left 09/27/2018   Procedure: Patch Angioplasty of left femoral artery using xenosure bovine pericardium patch;  Surgeon: Angelia Mould, MD;  Location: [facility];  Service: Vascular;  Laterality: Left;  . PERIPHERAL VASCULAR INTERVENTION  09/27/2018   Procedure: PERIPHERAL VASCULAR INTERVENTION;  Surgeon: Nigel Mormon, MD;  Location: [facility];  Service: Cardiovascular;;  . TOTAL HIP ARTHROPLASTY    . TOTAL KNEE ARTHROPLASTY    . TOTAL SHOULDER ARTHROPLASTY      Social History   Socioeconomic History  . Marital status: Widowed    Spouse name: Not on file  . Number of children: 5  . Years  of education: College  . Highest education level: Some college, no degree  Occupational History  . Not on file  Tobacco Use  . Smoking status: Never Smoker  . Smokeless tobacco: Never Used  Substance and Sexual Activity  . Alcohol use: No    Alcohol/week: 0.0 standard drinks  . Drug use: No  . Sexual activity: Not on file  Other Topics Concern  . Not on file  Social History Narrative   No caffeine use   Social Determinants of Health   Financial Resource Strain:   . Difficulty of Paying Living Expenses:   Food Insecurity:   . Worried About Charity fundraiser in the Last Year:    . Arboriculturist in the Last Year:   Transportation Needs:   . Film/video editor (Medical):   Marland Kitchen Lack of Transportation (Non-Medical):   Physical Activity:   . Days of Exercise per Week:   . Minutes of Exercise per Session:   Stress:   . Feeling of Stress :   Social Connections:   . Frequency of Communication with Friends and Family:   . Frequency of Social Gatherings with Friends and Family:   . Attends Religious Services:   . Active Member of Clubs or Organizations:   . Attends Archivist Meetings:   Marland Kitchen Marital Status:   Intimate Partner Violence:   . Fear of Current or Ex-Partner:   . Emotionally Abused:   Marland Kitchen Physically Abused:   . Sexually Abused:     Review of Systems  Cardiovascular: Positive for leg swelling (mild and stable). Negative for chest pain and dyspnea on exertion.  Musculoskeletal: Positive for back pain and joint pain.  Gastrointestinal: Negative for melena.      Objective:  Blood pressure (!) 143/80, pulse 79, temperature 98 F (36.7 C), temperature source Temporal, resp. rate 16, height 5' (1.524 m), weight 126 lb (57.2 kg), SpO2 95 %. Body mass index is 24.61 kg/m.    Physical Exam  Constitutional: She appears well-developed and well-nourished. No distress.  Petite   Neck: No JVD present.  Cardiovascular: Normal rate and regular rhythm. Exam reveals no gallop.  Murmur heard.  Early systolic murmur is present with a grade of 2/6. Aortic Area  Pulses:      Carotid pulses are on the right side with bruit and on the left side with bruit.      Femoral pulses are 1+ on the right side and 1+ on the left side.      Popliteal pulses are 1+ on the right side and 1+ on the left side.       Dorsalis pedis pulses are 2+ on the right side and 2+ on the left side.       Posterior tibial pulses are 1+ on the right side and 1+ on the left side.  Trace  LE edema. No JVD.  No ulceration.   Pulmonary/Chest: Effort normal and breath sounds normal.    Abdominal: Soft. Bowel sounds are normal.  Skin: No cyanosis.  Vitals reviewed.  Radiology:  MRI of brain 01/18/2019:  Motion degraded study Negative for acute infarct. Atrophy and moderate to extensive chronic small vessel ischemia.  CT of head 01/17/2019: No acute intracranial hemorrhage or evidence of acute demarcated cortical infarction. ASPECTS 10 Redemonstrated advanced chronic small vessel ischemic disease with chronic left thalamic lacunar infarct. Moderate generalized parenchymal atrophy.  Laboratory examination:   CMP Latest Ref Rng & Units 01/21/2019 01/20/2019 01/19/2019  Glucose 70 - 99 mg/dL 118(H) 107(H) 90  BUN 8 - 23 mg/dL 10 9 11   Creatinine 0.44 - 1.00 mg/dL 0.76 0.82 0.74  Sodium 135 - 145 mmol/L 139 139 139  Potassium 3.5 - 5.1 mmol/L 3.3(L) 3.4(L) 3.9  Chloride 98 - 111 mmol/L 103 100 105  CO2 22 - 32 mmol/L 27 28 24   Calcium 8.9 - 10.3 mg/dL 8.7(L) 8.9 8.7(L)  Total Protein 6.5 - 8.1 g/dL - - -  Total Bilirubin 0.3 - 1.2 mg/dL - - -  Alkaline Phos 38 - 126 U/L - - -  AST 15 - 41 U/L - - -  ALT 0 - 44 U/L - - -   CBC Latest Ref Rng & Units 01/21/2019 01/18/2019 01/17/2019  WBC 4.0 - 10.5 K/uL 8.4 9.8 -  Hemoglobin 12.0 - 15.0 g/dL 11.8(L) 10.7(L) 12.9  Hematocrit 36.0 - 46.0 % 36.3 33.0(L) 38.0  Platelets 150 - 400 K/uL 215 193 -   Lipid Panel     Component Value Date/Time   CHOL 114 01/18/2019 1036   TRIG 37 01/18/2019 1036   HDL 46 01/18/2019 1036   CHOLHDL 2.5 01/18/2019 1036   VLDL 7 01/18/2019 1036   LDLCALC 61 01/18/2019 1036   HEMOGLOBIN A1C Lab Results  Component Value Date   HGBA1C 5.4 01/18/2019   MPG 108.28 01/18/2019   TSH No results for input(s): TSH in the last 8760 hours.  PRN Meds:. Medications Discontinued During This Encounter  Medication Reason  . meclizine (ANTIVERT) 12.5 MG tablet No longer needed (for PRN medications)  . acetaminophen (TYLENOL) 650 MG CR tablet Change in therapy  . Maltodextrin-Xanthan Gum (RESOURCE  THICKENUP CLEAR) POWD No longer needed (for PRN medications)  . rivaroxaban (XARELTO) 2.5 MG TABS tablet Completed Course  . hydrALAZINE (APRESOLINE) 25 MG tablet    Current Meds  Medication Sig  . acetaminophen (TYLENOL) 325 MG tablet Take 650 mg by mouth every 6 (six) hours as needed. Take with Ultram  . amLODipine (NORVASC) 10 MG tablet Take 1 tablet (10 mg total) by mouth daily.  Marland Kitchen antiseptic oral rinse (BIOTENE) LIQD 15 mLs by Mouth Rinse route daily. For dry mouth  . aspirin 81 MG tablet Take 81 mg by mouth daily.  . Carboxymethylcellulose Sodium (THERATEARS) 0.25 % SOLN Apply to eye.  . cetirizine (ZYRTEC) 10 MG tablet Take 10 mg by mouth daily.  . Cholecalciferol 25 MCG (1000 UT) tablet 2,000 Units.   Marland Kitchen losartan (COZAAR) 100 MG tablet Take 100 mg by mouth daily.  . mineral oil-hydrophilic petrolatum (AQUAPHOR) ointment Apply topically as needed for dry skin.  . pravastatin (PRAVACHOL) 20 MG tablet Take 1 tablet (20 mg total) by mouth daily at 6 PM.  . sertraline (ZOLOFT) 50 MG tablet Take 25 mg by mouth daily.   . traMADol (ULTRAM) 50 MG tablet Take 25 mg by mouth 3 (three) times daily.   . [DISCONTINUED] rivaroxaban (XARELTO) 2.5 MG TABS tablet Take 1 tablet (2.5 mg total) by mouth 2 (two) times daily.    Cardiac Studies:   Vasc US with ABI 09/29/2018:  Right: Resting right ankle-brachial index is within normal range. No evidence of significant right lower extremity arterial disease. Left: Resting left ankle-brachial index is within normal range. No evidence of significant left lower extremity arterial disease.  Peripheral arteriogram and angioplasty to CTO left SFA 09/27/2018: 1. Ultrasound guided right common femoral artery and left anterior tibial artery access 2. Abdominal aortogram 3. Crossover technique into left  common iliac artery 4. Nonselective bilateral lower extremity distal runoff 5. Selective left lower extremity runoff 6. Successful PTCA and stenting Lt SFA  with 3 overlapping stents     5.0 X 150 mm, 5.0 X 120 mm, 5.0 X 60 mm Innova self exapanding stents from distal to ostial Lt SFA.  7. Hemostasis 6/7 Fr Mynx closure right common femoral artery 8. Conscious sedation monitoring 228 min  Echocardiogram 10/21/2016: Left ventricle cavity is normal in size. Mild concentric hypertrophy of the left ventricle. Normal global wall motion. Doppler evidence of grade II (pseudonormal) diastolic dysfunction. Diastolic dysfunction do not suggest elevated LA/LV endiastolic pressure. Calculated EF 55%. Left atrial cavity is mildly dilated by volume. Right atrial cavity is mildly dilated. Mild aortic valve leaflet calcification. Mildly restricted aortic valve leaflets. Trace aortic valve stenosis. Aortic valve peak pressure gradient of 17 and mean gradient of 7 mmHg, calculated aortic valve area 1.97 cm. Mild (Grade I) aortic regurgitation. Mild to moderate mitral regurgitation. Mild to moderate tricuspid regurgitation. Moderate pulmonary hypertension. Pulmonary artery systolic pressure is estimated at 40 mm Hg. Insignificant pericardial effusion. IVC is dilated with poor inspiration collapse consistent with elevated right atrial pressure.  Lexiscan myoview stress test 10/19/2016: 1. The resting electrocardiogram demonstrated normal sinus rhythm, normal resting conduction, no resting arrhythmias and normal rest repolarization. Resting EKG shows NSR. Stress EKG is non-diagnostic for ischemia as it a pharmacologic stress using Lexiscan. Stress symptoms included dyspnea. There was rare PVC and PAC. 2. Myocardial perfusion imaging is normal. Overall left ventricular systolic function was normal without regional wall motion abnormalities. The left ventricular ejection fraction was 74%.  Carotid artery duplex 02/24/2018: Bilateral carotid arteries show mild heterogeneous plaque without stenosis. Antegrade right vertebral artery flow. Antegrade left vertebral artery flow. No  significant change since 02/19/2016.  EKG: EKG 08/09/2019: Normal sinus rhythm at the rate of 80 bpm, left atrial enlargement, normal axis.  No evidence of ischemia.  Assessment:     ICD-10-CM   1. PAD (peripheral artery disease) (HCC)  I73.9 EKG 12-Lead  2. Essential hypertension  I10 hydrALAZINE (APRESOLINE) 25 MG tablet    DISCONTINUED: hydrALAZINE (APRESOLINE) 25 MG tablet  3. History of lacunar cerebrovascular accident  Z86.73    Recommendations:   CHAWN RACKLIFF  is a 86 y.o. female  with hypertension, hyperlipidemia, spinal stenosis, PAD with left heel ulceration. Patient underwent complex angioplasty to left SFA on 09/27/18 and noted to have dissection to left femoral at the end of the case. She urgently went for left femoral endarterectomy with bovine pericardial patch angioplasty with Dr. Scot Dock.    She is presently doing well from a vascular standpoint and has a bounding lower extremity pulse, ulceration is completely healed.  As it has been 6 months since angioplasty, I discontinued Xarelto 2.5 mg twice daily to reduce risk of bleeding, continue aspirin indefinitely.  Blood pressure is elevated, will add hydralazine 25 p.o. 3 times daily.  She can hold it if systolic blood pressure is <120/70 mmHg.  Otherwise she is on a statin, doing well, she is presently 86 years of age, I will see her back in 6 months as per her wish.  Adrian Prows, MD, [facility] 08/09/2019, 3:54 PM [facility] [place] Office: [phone]

## 2019-08-16 ENCOUNTER — Ambulatory Visit: Payer: Medicare Other | Admitting: Cardiology

## 2019-08-22 ENCOUNTER — Ambulatory Visit: Payer: Medicare Other | Admitting: Cardiology

## 2019-08-23 ENCOUNTER — Telehealth: Payer: Self-pay

## 2019-08-23 DIAGNOSIS — I10 Essential (primary) hypertension: Secondary | ICD-10-CM

## 2019-08-23 MED ORDER — HYDRALAZINE HCL 25 MG PO TABS: 25.0000 mg | ORAL_TABLET | Freq: Three times a day (TID) | ORAL | 3 refills | Status: DC

## 2019-08-23 MED ORDER — HYDRALAZINE HCL 25 MG PO TABS: 25.0000 mg | ORAL_TABLET | Freq: Three times a day (TID) | ORAL | 3 refills | Status: DC | PRN

## 2019-08-23 NOTE — Addendum Note (Signed)
Addended by: Kela Millin on: 08/23/2019 04:16 PM   Modules accepted: Orders

## 2019-08-23 NOTE — Telephone Encounter (Signed)
Altha Harm from [facility] called to ask if new order for Hydralazine could be written. They want it changed from 1 tablet (25 mg total) by mouth 3 (three) times daily. Hold for BP <120/70 mm Hg. To new order to be written for Hydralazine 25 mg once daily. He bp range has been 120/60- 130/72. Nurse says they are giving it only about 1/3 of the time.

## 2019-08-23 NOTE — Telephone Encounter (Signed)
Due to low blood pressure, hydralazine made as needed.

## 2019-08-24 ENCOUNTER — Telehealth: Payer: Self-pay

## 2019-08-24 NOTE — Telephone Encounter (Signed)
One tablet TID PRN for SBP > 120 mm Hg. Hold for SBP <120 mm Hg

## 2019-08-24 NOTE — Telephone Encounter (Signed)
Christine from [facility] called and needs more clarification on Hydralazine order. She wants to know Bp parameter for the new PRN order. Is it PRN or PRN daily as needed for bp of (parameters). She is saying the orders need to be very specific. Christine from [facility] .

## 2019-08-25 NOTE — Telephone Encounter (Signed)
[name] at [facility] has asked for verbal order. I called to give order to [name] she  was not in today, [name] at [facility] asked for me to fax order for Hydralazine to [phone]. Order faxed

## 2019-09-11 DIAGNOSIS — R54 Age-related physical debility: Secondary | ICD-10-CM | POA: Diagnosis not present

## 2019-09-11 DIAGNOSIS — I739 Peripheral vascular disease, unspecified: Secondary | ICD-10-CM | POA: Diagnosis not present

## 2019-09-11 DIAGNOSIS — F329 Major depressive disorder, single episode, unspecified: Secondary | ICD-10-CM | POA: Diagnosis not present

## 2019-09-11 DIAGNOSIS — N183 Chronic kidney disease, stage 3 unspecified: Secondary | ICD-10-CM | POA: Diagnosis not present

## 2019-09-11 DIAGNOSIS — I129 Hypertensive chronic kidney disease with stage 1 through stage 4 chronic kidney disease, or unspecified chronic kidney disease: Secondary | ICD-10-CM | POA: Diagnosis not present

## 2019-09-11 DIAGNOSIS — J302 Other seasonal allergic rhinitis: Secondary | ICD-10-CM | POA: Diagnosis not present

## 2019-09-11 DIAGNOSIS — E46 Unspecified protein-calorie malnutrition: Secondary | ICD-10-CM | POA: Diagnosis not present

## 2019-09-11 DIAGNOSIS — E785 Hyperlipidemia, unspecified: Secondary | ICD-10-CM | POA: Diagnosis not present

## 2019-09-11 DIAGNOSIS — M159 Polyosteoarthritis, unspecified: Secondary | ICD-10-CM | POA: Diagnosis not present

## 2019-09-11 DIAGNOSIS — E559 Vitamin D deficiency, unspecified: Secondary | ICD-10-CM | POA: Diagnosis not present

## 2019-09-12 DIAGNOSIS — R293 Abnormal posture: Secondary | ICD-10-CM | POA: Diagnosis not present

## 2019-09-12 DIAGNOSIS — R2681 Unsteadiness on feet: Secondary | ICD-10-CM | POA: Diagnosis not present

## 2019-09-12 DIAGNOSIS — M6281 Muscle weakness (generalized): Secondary | ICD-10-CM | POA: Diagnosis not present

## 2019-09-12 DIAGNOSIS — R2689 Other abnormalities of gait and mobility: Secondary | ICD-10-CM | POA: Diagnosis not present

## 2019-09-14 DIAGNOSIS — R2681 Unsteadiness on feet: Secondary | ICD-10-CM | POA: Diagnosis not present

## 2019-09-14 DIAGNOSIS — M6281 Muscle weakness (generalized): Secondary | ICD-10-CM | POA: Diagnosis not present

## 2019-09-14 DIAGNOSIS — R293 Abnormal posture: Secondary | ICD-10-CM | POA: Diagnosis not present

## 2019-09-14 DIAGNOSIS — R2689 Other abnormalities of gait and mobility: Secondary | ICD-10-CM | POA: Diagnosis not present

## 2019-09-18 DIAGNOSIS — R2681 Unsteadiness on feet: Secondary | ICD-10-CM | POA: Diagnosis not present

## 2019-09-18 DIAGNOSIS — M6281 Muscle weakness (generalized): Secondary | ICD-10-CM | POA: Diagnosis not present

## 2019-09-18 DIAGNOSIS — R2689 Other abnormalities of gait and mobility: Secondary | ICD-10-CM | POA: Diagnosis not present

## 2019-09-18 DIAGNOSIS — R293 Abnormal posture: Secondary | ICD-10-CM | POA: Diagnosis not present

## 2019-09-20 DIAGNOSIS — M6281 Muscle weakness (generalized): Secondary | ICD-10-CM | POA: Diagnosis not present

## 2019-09-20 DIAGNOSIS — R2689 Other abnormalities of gait and mobility: Secondary | ICD-10-CM | POA: Diagnosis not present

## 2019-09-20 DIAGNOSIS — R293 Abnormal posture: Secondary | ICD-10-CM | POA: Diagnosis not present

## 2019-09-20 DIAGNOSIS — R2681 Unsteadiness on feet: Secondary | ICD-10-CM | POA: Diagnosis not present

## 2019-09-21 DIAGNOSIS — M6281 Muscle weakness (generalized): Secondary | ICD-10-CM | POA: Diagnosis not present

## 2019-09-21 DIAGNOSIS — R293 Abnormal posture: Secondary | ICD-10-CM | POA: Diagnosis not present

## 2019-09-21 DIAGNOSIS — R2681 Unsteadiness on feet: Secondary | ICD-10-CM | POA: Diagnosis not present

## 2019-09-21 DIAGNOSIS — R2689 Other abnormalities of gait and mobility: Secondary | ICD-10-CM | POA: Diagnosis not present

## 2019-09-25 DIAGNOSIS — R293 Abnormal posture: Secondary | ICD-10-CM | POA: Diagnosis not present

## 2019-09-25 DIAGNOSIS — M6281 Muscle weakness (generalized): Secondary | ICD-10-CM | POA: Diagnosis not present

## 2019-09-25 DIAGNOSIS — R2689 Other abnormalities of gait and mobility: Secondary | ICD-10-CM | POA: Diagnosis not present

## 2019-09-25 DIAGNOSIS — R2681 Unsteadiness on feet: Secondary | ICD-10-CM | POA: Diagnosis not present

## 2019-09-27 DIAGNOSIS — R2681 Unsteadiness on feet: Secondary | ICD-10-CM | POA: Diagnosis not present

## 2019-09-27 DIAGNOSIS — M6281 Muscle weakness (generalized): Secondary | ICD-10-CM | POA: Diagnosis not present

## 2019-09-27 DIAGNOSIS — R2689 Other abnormalities of gait and mobility: Secondary | ICD-10-CM | POA: Diagnosis not present

## 2019-09-27 DIAGNOSIS — R293 Abnormal posture: Secondary | ICD-10-CM | POA: Diagnosis not present

## 2019-09-28 DIAGNOSIS — R2681 Unsteadiness on feet: Secondary | ICD-10-CM | POA: Diagnosis not present

## 2019-09-28 DIAGNOSIS — R293 Abnormal posture: Secondary | ICD-10-CM | POA: Diagnosis not present

## 2019-09-28 DIAGNOSIS — R2689 Other abnormalities of gait and mobility: Secondary | ICD-10-CM | POA: Diagnosis not present

## 2019-09-28 DIAGNOSIS — M6281 Muscle weakness (generalized): Secondary | ICD-10-CM | POA: Diagnosis not present

## 2019-10-02 DIAGNOSIS — M6281 Muscle weakness (generalized): Secondary | ICD-10-CM | POA: Diagnosis not present

## 2019-10-02 DIAGNOSIS — R293 Abnormal posture: Secondary | ICD-10-CM | POA: Diagnosis not present

## 2019-10-02 DIAGNOSIS — R2681 Unsteadiness on feet: Secondary | ICD-10-CM | POA: Diagnosis not present

## 2019-10-02 DIAGNOSIS — R2689 Other abnormalities of gait and mobility: Secondary | ICD-10-CM | POA: Diagnosis not present

## 2019-10-04 DIAGNOSIS — F339 Major depressive disorder, recurrent, unspecified: Secondary | ICD-10-CM | POA: Diagnosis not present

## 2019-10-04 DIAGNOSIS — I739 Peripheral vascular disease, unspecified: Secondary | ICD-10-CM | POA: Diagnosis not present

## 2019-10-04 DIAGNOSIS — N183 Chronic kidney disease, stage 3 unspecified: Secondary | ICD-10-CM | POA: Diagnosis not present

## 2019-10-04 DIAGNOSIS — I129 Hypertensive chronic kidney disease with stage 1 through stage 4 chronic kidney disease, or unspecified chronic kidney disease: Secondary | ICD-10-CM | POA: Diagnosis not present

## 2019-10-04 DIAGNOSIS — E440 Moderate protein-calorie malnutrition: Secondary | ICD-10-CM | POA: Diagnosis not present

## 2019-10-04 DIAGNOSIS — R54 Age-related physical debility: Secondary | ICD-10-CM | POA: Diagnosis not present

## 2019-10-26 ENCOUNTER — Telehealth: Payer: Self-pay | Admitting: Neurology

## 2019-10-26 NOTE — Telephone Encounter (Signed)
Patient's daughter Jarome Lamas called in and needs a letter from Dr. Tomi Likens to send to the social security administration. She is her Medical POA. The letter needs to state her neurological condition, her inability to handle her affairs, and the last date examimed and the synopsis of that visit. She would like the letter mailed to her at [address] [place], [place] [address]

## 2019-10-26 NOTE — Telephone Encounter (Signed)
Daughter Mardene Celeste advised of note

## 2019-10-26 NOTE — Telephone Encounter (Signed)
Both previous office appointments were virtual visits.  I need her to first come to the office for a more thorough cognitive exam before I can prepare a letter.

## 2019-11-30 DIAGNOSIS — M1611 Unilateral primary osteoarthritis, right hip: Secondary | ICD-10-CM | POA: Diagnosis not present

## 2019-12-04 DIAGNOSIS — H5702 Anisocoria: Secondary | ICD-10-CM | POA: Diagnosis not present

## 2019-12-04 DIAGNOSIS — H5203 Hypermetropia, bilateral: Secondary | ICD-10-CM | POA: Diagnosis not present

## 2019-12-04 DIAGNOSIS — Z961 Presence of intraocular lens: Secondary | ICD-10-CM | POA: Diagnosis not present

## 2019-12-04 DIAGNOSIS — H43813 Vitreous degeneration, bilateral: Secondary | ICD-10-CM | POA: Diagnosis not present

## 2019-12-13 DIAGNOSIS — R293 Abnormal posture: Secondary | ICD-10-CM | POA: Diagnosis not present

## 2019-12-13 DIAGNOSIS — R05 Cough: Secondary | ICD-10-CM | POA: Diagnosis not present

## 2019-12-13 DIAGNOSIS — Z9181 History of falling: Secondary | ICD-10-CM | POA: Diagnosis not present

## 2019-12-14 DIAGNOSIS — M25551 Pain in right hip: Secondary | ICD-10-CM | POA: Diagnosis not present

## 2019-12-18 DIAGNOSIS — R293 Abnormal posture: Secondary | ICD-10-CM | POA: Diagnosis not present

## 2019-12-18 DIAGNOSIS — Z9181 History of falling: Secondary | ICD-10-CM | POA: Diagnosis not present

## 2019-12-20 DIAGNOSIS — R293 Abnormal posture: Secondary | ICD-10-CM | POA: Diagnosis not present

## 2019-12-20 DIAGNOSIS — Z9181 History of falling: Secondary | ICD-10-CM | POA: Diagnosis not present

## 2020-01-04 DIAGNOSIS — L219 Seborrheic dermatitis, unspecified: Secondary | ICD-10-CM | POA: Diagnosis not present

## 2020-01-04 DIAGNOSIS — D225 Melanocytic nevi of trunk: Secondary | ICD-10-CM | POA: Diagnosis not present

## 2020-01-04 DIAGNOSIS — L89321 Pressure ulcer of left buttock, stage 1: Secondary | ICD-10-CM | POA: Diagnosis not present

## 2020-01-04 DIAGNOSIS — L859 Epidermal thickening, unspecified: Secondary | ICD-10-CM | POA: Diagnosis not present

## 2020-01-04 DIAGNOSIS — L821 Other seborrheic keratosis: Secondary | ICD-10-CM | POA: Diagnosis not present

## 2020-01-04 DIAGNOSIS — Z85828 Personal history of other malignant neoplasm of skin: Secondary | ICD-10-CM | POA: Diagnosis not present

## 2020-01-04 DIAGNOSIS — L578 Other skin changes due to chronic exposure to nonionizing radiation: Secondary | ICD-10-CM | POA: Diagnosis not present

## 2020-01-04 DIAGNOSIS — D2361 Other benign neoplasm of skin of right upper limb, including shoulder: Secondary | ICD-10-CM | POA: Diagnosis not present

## 2020-01-05 DIAGNOSIS — Z5181 Encounter for therapeutic drug level monitoring: Secondary | ICD-10-CM | POA: Diagnosis not present

## 2020-01-05 DIAGNOSIS — E559 Vitamin D deficiency, unspecified: Secondary | ICD-10-CM | POA: Diagnosis not present

## 2020-01-05 DIAGNOSIS — I10 Essential (primary) hypertension: Secondary | ICD-10-CM | POA: Diagnosis not present

## 2020-01-05 DIAGNOSIS — D649 Anemia, unspecified: Secondary | ICD-10-CM | POA: Diagnosis not present

## 2020-01-12 DIAGNOSIS — N183 Chronic kidney disease, stage 3 unspecified: Secondary | ICD-10-CM | POA: Diagnosis not present

## 2020-01-12 DIAGNOSIS — R54 Age-related physical debility: Secondary | ICD-10-CM | POA: Diagnosis not present

## 2020-01-12 DIAGNOSIS — E46 Unspecified protein-calorie malnutrition: Secondary | ICD-10-CM | POA: Diagnosis not present

## 2020-01-12 DIAGNOSIS — I129 Hypertensive chronic kidney disease with stage 1 through stage 4 chronic kidney disease, or unspecified chronic kidney disease: Secondary | ICD-10-CM | POA: Diagnosis not present

## 2020-01-12 DIAGNOSIS — R52 Pain, unspecified: Secondary | ICD-10-CM | POA: Diagnosis not present

## 2020-01-12 DIAGNOSIS — I739 Peripheral vascular disease, unspecified: Secondary | ICD-10-CM | POA: Diagnosis not present

## 2020-01-12 DIAGNOSIS — J302 Other seasonal allergic rhinitis: Secondary | ICD-10-CM | POA: Diagnosis not present

## 2020-01-12 DIAGNOSIS — F329 Major depressive disorder, single episode, unspecified: Secondary | ICD-10-CM | POA: Diagnosis not present

## 2020-01-12 DIAGNOSIS — E785 Hyperlipidemia, unspecified: Secondary | ICD-10-CM | POA: Diagnosis not present

## 2020-01-15 DIAGNOSIS — M25612 Stiffness of left shoulder, not elsewhere classified: Secondary | ICD-10-CM | POA: Diagnosis not present

## 2020-01-15 DIAGNOSIS — M25512 Pain in left shoulder: Secondary | ICD-10-CM | POA: Diagnosis not present

## 2020-01-15 DIAGNOSIS — W19XXXA Unspecified fall, initial encounter: Secondary | ICD-10-CM | POA: Diagnosis not present

## 2020-01-18 DIAGNOSIS — I10 Essential (primary) hypertension: Secondary | ICD-10-CM | POA: Diagnosis not present

## 2020-01-18 NOTE — Progress Notes (Signed)
NEUROLOGY FOLLOW UP OFFICE NOTE  [name] [id number]  HISTORY OF PRESENT ILLNESS: [name] is an 86 year old white female with CAD, HTN, dementia, questionable A fib who follows up for TIA and dementia.  She is accompanied by her daughter who supplements history.  UPDATE: Current medications: Xarelto, ASA 81mg , sertraline 75mg  daily, tramadol, amlodipine, pravastatin 20mg  , losartan.  She resides at [facility] where she has 24 hour care. She does endorse depression about not being able to live in her house.  She has had had more difficulty walking and transferring.  She has had falls.  She says that her right leg won't move.  She has right hip pain due to osteoarthritis knee pain back.  Her hands also shake at times and feels anxious.    HISTORY: She was admitted to [facility] on 01/17/2019 presenting with sudden onset confusion with right sided weakness and aphasia. In ED, CT head was negative for blled and she was treated with IV tPA. CTA of head and neck demonstrated on large vessel occlusion or significant stenosis. She had few runs of tachycardia in ED but telemetry negative for atrial fibrillation. MRI of brain was negative for acute stroke. Echocardiogram showed EF >65% with severe aortic annular calcification but no source of emboli. LDL was 61 and Hgb A1c was 5.4. Due to confusion, she underwent EEG, which showed generalized theta-delta slowing indicative of moderate diffuse encephalopathy. UA was positive for UTI, which was thought to be the cause of her altered mental status. However, her daughter didn't think that her symptoms were due to the UTI because she has had prior UTIs in the past and never demonstrated these symptoms. She was discharged on both ASA and Plavix. Her cardiologist wanted to hook her up to a cardiac event monitor but it would require 2 weeks quarantine, so he discontinued Plavix and just went ahead and  started her on Xarelto.   Her daughter reports that she has had more difficulty swallowing since the stroke. Her memory has been worse. She does not remember conversations or she is repeating questions. She has trouble with some instruction which may be affected by her hearing loss. She has been more irritable with angry outbursts since the hospitalization. She has been a little more paranoia. She has some days when she is confused. She needs assistance with dressing, using toilet and bathing, some of it is compromised by arthritis. She needs help with transferring (getting in and out of bed and into the bathroom). She feels lonely. She is upset about having to be in assisted living facility.  Prior to hospitalization, she has had memory deficits and irritability, thought to be onset of dementia. She needs help with managing medications. She is anxious. However, it is worse since the stroke. She previously took Aricept and eventually refused to take it.   PAST MEDICAL HISTORY: Past Medical History:  Diagnosis Date  . Adenomatous colon polyp 2007  . Arthritis   . Bilateral carotid bruits 07/27/2018  . CAD (coronary artery disease)   . Chronic back pain   . Gall stone pancreatitis   . Heart murmur   . High cholesterol   . Hx: UTI (urinary tract infection)    Now infrequent  . Hypertension   . Left knee pain Nov. 18, 2014  . Onychomycosis   . Osteoarthritis   . Osteopenia   . PAD (peripheral artery disease) ([place])   . PVD (peripheral vascular disease) (  [place])   . Skin ulcer of left heel ([place])   . Trigger finger, right   . Vertigo     MEDICATIONS: Current Outpatient Medications on File Prior to Visit  Medication Sig Dispense Refill  . acetaminophen (TYLENOL) 325 MG tablet Take 650 mg by mouth every 6 (six) hours as needed. Take with Ultram    . amLODipine (NORVASC) 10 MG tablet Take 1 tablet (10 mg total) by mouth daily. 90 tablet 3  . antiseptic oral rinse (BIOTENE)  LIQD 15 mLs by Mouth Rinse route daily. For dry mouth    . aspirin 81 MG tablet Take 81 mg by mouth daily.    . Carboxymethylcellulose Sodium (THERATEARS) 0.25 % SOLN Apply to eye.    . cetirizine (ZYRTEC) 10 MG tablet Take 10 mg by mouth daily.    . Cholecalciferol 25 MCG (1000 UT) tablet 2,000 Units.     . hydrALAZINE (APRESOLINE) 25 MG tablet Take 1 tablet (25 mg total) by mouth 3 (three) times daily. Hold for BP <120/70 mm Hg 270 tablet 3  . hydrALAZINE (APRESOLINE) 25 MG tablet Take 1 tablet (25 mg total) by mouth 3 (three) times daily as needed. Take for SBP > 140 mm Hg. Hold if SBP<120 mm Hg 270 tablet 3  . losartan (COZAAR) 100 MG tablet Take 100 mg by mouth daily.    . mineral oil-hydrophilic petrolatum (AQUAPHOR) ointment Apply topically as needed for dry skin.    . pravastatin (PRAVACHOL) 20 MG tablet Take 1 tablet (20 mg total) by mouth daily at 6 PM. 30 tablet 0  . sertraline (ZOLOFT) 50 MG tablet Take 25 mg by mouth daily.     . traMADol (ULTRAM) 50 MG tablet Take 25 mg by mouth 3 (three) times daily.      No current facility-administered medications on file prior to visit.    ALLERGIES: Allergies  Allergen Reactions  . Hydrochlorothiazide Other (See Comments)    Dropped sodium level  . Penicillins     Unknown  Did it involve swelling of the face/tongue/throat, SOB, or low BP? Unknown Did it involve sudden or severe rash/hives, skin peeling, or any reaction on the inside of your mouth or nose? Unknown Did you need to seek medical attention at a hospital or doctor's office? Unknown When did it last happen?unknown If all above answers are "NO", may proceed with cephalosporin use.   . Sulfa Antibiotics     unknown    FAMILY HISTORY: Family History  Problem Relation Age of Onset  . Cancer Mother        ovarian  . Pneumonia Father   . Heart attack Brother   . Heart disease Brother        Heart Disease before age 60  . Healthy Child     SOCIAL  HISTORY: Social History   Socioeconomic History  . Marital status: Widowed    Spouse name: Not on file  . Number of children: 5  . Years of education: College  . Highest education level: Some college, no degree  Occupational History  . Not on file  Tobacco Use  . Smoking status: Never Smoker  . Smokeless tobacco: Never Used  Vaping Use  . Vaping Use: Never used  Substance and Sexual Activity  . Alcohol use: No    Alcohol/week: 0.0 standard drinks  . Drug use: No  . Sexual activity: Not on file  Other Topics Concern  . Not on file  Social History Narrative  No caffeine use   Social Determinants of Radio broadcast assistant Strain:   . Difficulty of Paying Living Expenses: Not on file  Food Insecurity:   . Worried About Charity fundraiser in the Last Year: Not on file  . Ran Out of Food in the Last Year: Not on file  Transportation Needs:   . Lack of Transportation (Medical): Not on file  . Lack of Transportation (Non-Medical): Not on file  Physical Activity:   . Days of Exercise per Week: Not on file  . Minutes of Exercise per Session: Not on file  Stress:   . Feeling of Stress : Not on file  Social Connections:   . Frequency of Communication with Friends and Family: Not on file  . Frequency of Social Gatherings with Friends and Family: Not on file  . Attends Religious Services: Not on file  . Active Member of Clubs or Organizations: Not on file  . Attends Archivist Meetings: Not on file  . Marital Status: Not on file  Intimate Partner Violence:   . Fear of Current or Ex-Partner: Not on file  . Emotionally Abused: Not on file  . Physically Abused: Not on file  . Sexually Abused: Not on file    PHYSICAL EXAM: Blood pressure 138/77, pulse 84, height 5' (1.524 m), weight 130 lb (59 kg), SpO2 94 %. General: No acute distress.  Patient appears well-groomed.   Head:  Normocephalic/atraumatic Eyes:  Fundi examined but not visualized Neck: supple, no  paraspinal tenderness, full range of motion Heart:  Regular rate and rhythm Lungs:  Clear to auscultation bilaterally Back: No paraspinal tenderness Neurological Exam: alert and oriented to person, place, and time. Attention span and concentration intact, recent and remote memory intact, fund of knowledge intact.  Speech fluent and not dysarthric, language intact.   [place] Mental Exam 01/19/2020  Weekday Correct 1  Current year 0  What state are we in? 1  Amount spent 1  Amount left 0  # of Animals 2  5 objects recall 0  Number series 2  Hour markers 0  Time correct 0  Placed X in triangle correctly 1  Largest Figure 1  Name of female 0  Date back to work 0  Type of work 0  State she lived in 0  Total score 9   CN II-XII intact. Bulk and tone normal, muscle strength 4+/5 right hip flexion, otherwise 5-/5.  Sensation to pinprick and vibration intact.  Deep tendon reflexes 1+ throughout, toes downgoing.  Finger to nose testing intact.  Gait deferred (in wheelchair).  IMPRESSION: 1.  Left hemispheric transient ischemic attack presenting as right-sided weakness and aphasia 2.  Major neurocognitive disorder, likely vascular 3.  Questionable atrial fibrillation 4.  Hypertension 5. Hyperlipidemia 6. Depression/anxiety 7. Peripheral arterial/vascular disease  PLAN: 1.  Secondary stroke prevention as per PCP & cardiology:  - ASA 81mg  and Xarelto  - Statin therapy (LDL goal less than 70)  -  Glycemic control (Hgb A1c goal less than 7)  -  Blood pressure control 2.  Sertraline 75mg  daily for depression 3.  Encouraged physical activity/ambulating with assistance (staff, therapist) 4.  Follow up in 8 months.  Total time spent with patient and reviewing chart:  63 minutes.  Metta Clines, DO  CC: Merilynn Finland, MD

## 2020-01-19 ENCOUNTER — Other Ambulatory Visit: Payer: Self-pay

## 2020-01-19 ENCOUNTER — Ambulatory Visit (INDEPENDENT_AMBULATORY_CARE_PROVIDER_SITE_OTHER): Payer: Medicare Other | Admitting: Neurology

## 2020-01-19 ENCOUNTER — Encounter: Payer: Self-pay | Admitting: Neurology

## 2020-01-19 VITALS — BP 138/77 | HR 84 | Ht 60.0 in | Wt 130.0 lb

## 2020-01-19 DIAGNOSIS — I10 Essential (primary) hypertension: Secondary | ICD-10-CM | POA: Diagnosis not present

## 2020-01-19 DIAGNOSIS — F0390 Unspecified dementia without behavioral disturbance: Secondary | ICD-10-CM

## 2020-01-19 DIAGNOSIS — G459 Transient cerebral ischemic attack, unspecified: Secondary | ICD-10-CM

## 2020-01-19 DIAGNOSIS — F329 Major depressive disorder, single episode, unspecified: Secondary | ICD-10-CM

## 2020-01-19 DIAGNOSIS — F419 Anxiety disorder, unspecified: Secondary | ICD-10-CM | POA: Diagnosis not present

## 2020-01-19 DIAGNOSIS — E785 Hyperlipidemia, unspecified: Secondary | ICD-10-CM | POA: Diagnosis not present

## 2020-01-19 DIAGNOSIS — F0150 Vascular dementia without behavioral disturbance: Secondary | ICD-10-CM

## 2020-01-19 DIAGNOSIS — I739 Peripheral vascular disease, unspecified: Secondary | ICD-10-CM

## 2020-01-19 NOTE — Patient Instructions (Signed)
No change in medications at this time.  

## 2020-01-25 DIAGNOSIS — M19012 Primary osteoarthritis, left shoulder: Secondary | ICD-10-CM | POA: Diagnosis not present

## 2020-01-25 DIAGNOSIS — M25512 Pain in left shoulder: Secondary | ICD-10-CM | POA: Diagnosis not present

## 2020-01-25 DIAGNOSIS — M25612 Stiffness of left shoulder, not elsewhere classified: Secondary | ICD-10-CM | POA: Diagnosis not present

## 2020-01-29 DIAGNOSIS — M6281 Muscle weakness (generalized): Secondary | ICD-10-CM | POA: Diagnosis not present

## 2020-01-29 DIAGNOSIS — Z9181 History of falling: Secondary | ICD-10-CM | POA: Diagnosis not present

## 2020-01-29 DIAGNOSIS — R278 Other lack of coordination: Secondary | ICD-10-CM | POA: Diagnosis not present

## 2020-01-29 DIAGNOSIS — M25512 Pain in left shoulder: Secondary | ICD-10-CM | POA: Diagnosis not present

## 2020-02-01 DIAGNOSIS — K219 Gastro-esophageal reflux disease without esophagitis: Secondary | ICD-10-CM | POA: Diagnosis not present

## 2020-02-01 DIAGNOSIS — R110 Nausea: Secondary | ICD-10-CM | POA: Diagnosis not present

## 2020-02-01 DIAGNOSIS — Z7409 Other reduced mobility: Secondary | ICD-10-CM | POA: Diagnosis not present

## 2020-02-01 DIAGNOSIS — R52 Pain, unspecified: Secondary | ICD-10-CM | POA: Diagnosis not present

## 2020-02-02 DIAGNOSIS — M6281 Muscle weakness (generalized): Secondary | ICD-10-CM | POA: Diagnosis not present

## 2020-02-02 DIAGNOSIS — Z9181 History of falling: Secondary | ICD-10-CM | POA: Diagnosis not present

## 2020-02-02 DIAGNOSIS — R278 Other lack of coordination: Secondary | ICD-10-CM | POA: Diagnosis not present

## 2020-02-02 DIAGNOSIS — M25512 Pain in left shoulder: Secondary | ICD-10-CM | POA: Diagnosis not present

## 2020-02-05 DIAGNOSIS — M25512 Pain in left shoulder: Secondary | ICD-10-CM | POA: Diagnosis not present

## 2020-02-05 DIAGNOSIS — R278 Other lack of coordination: Secondary | ICD-10-CM | POA: Diagnosis not present

## 2020-02-05 DIAGNOSIS — Z9181 History of falling: Secondary | ICD-10-CM | POA: Diagnosis not present

## 2020-02-05 DIAGNOSIS — M6281 Muscle weakness (generalized): Secondary | ICD-10-CM | POA: Diagnosis not present

## 2020-02-06 DIAGNOSIS — Z9181 History of falling: Secondary | ICD-10-CM | POA: Diagnosis not present

## 2020-02-06 DIAGNOSIS — R278 Other lack of coordination: Secondary | ICD-10-CM | POA: Diagnosis not present

## 2020-02-06 DIAGNOSIS — M6281 Muscle weakness (generalized): Secondary | ICD-10-CM | POA: Diagnosis not present

## 2020-02-06 DIAGNOSIS — M25512 Pain in left shoulder: Secondary | ICD-10-CM | POA: Diagnosis not present

## 2020-02-09 DIAGNOSIS — M25512 Pain in left shoulder: Secondary | ICD-10-CM | POA: Diagnosis not present

## 2020-02-09 DIAGNOSIS — Z9181 History of falling: Secondary | ICD-10-CM | POA: Diagnosis not present

## 2020-02-09 DIAGNOSIS — M6281 Muscle weakness (generalized): Secondary | ICD-10-CM | POA: Diagnosis not present

## 2020-02-09 DIAGNOSIS — R278 Other lack of coordination: Secondary | ICD-10-CM | POA: Diagnosis not present

## 2020-02-12 ENCOUNTER — Other Ambulatory Visit: Payer: Self-pay

## 2020-02-12 ENCOUNTER — Ambulatory Visit: Payer: Medicare Other | Admitting: Cardiology

## 2020-02-12 ENCOUNTER — Encounter: Payer: Self-pay | Admitting: Cardiology

## 2020-02-12 VITALS — BP 132/62 | HR 72 | Resp 16 | Ht 60.0 in | Wt 131.0 lb

## 2020-02-12 DIAGNOSIS — M6281 Muscle weakness (generalized): Secondary | ICD-10-CM | POA: Diagnosis not present

## 2020-02-12 DIAGNOSIS — I739 Peripheral vascular disease, unspecified: Secondary | ICD-10-CM

## 2020-02-12 DIAGNOSIS — E7800 Pure hypercholesterolemia, unspecified: Secondary | ICD-10-CM

## 2020-02-12 DIAGNOSIS — Z9181 History of falling: Secondary | ICD-10-CM | POA: Diagnosis not present

## 2020-02-12 DIAGNOSIS — R278 Other lack of coordination: Secondary | ICD-10-CM | POA: Diagnosis not present

## 2020-02-12 DIAGNOSIS — M25512 Pain in left shoulder: Secondary | ICD-10-CM | POA: Diagnosis not present

## 2020-02-12 DIAGNOSIS — I10 Essential (primary) hypertension: Secondary | ICD-10-CM

## 2020-02-12 NOTE — Progress Notes (Signed)
Primary Physician:  Javier Glazier, MD   Patient ID: [name], female    DOB: 10-25-1930, 86 y.o.   MRN: [id number]  Subjective:   Chief Complaint  Patient presents with  . PAD (peripheral artery disease) ([place])  . Hypertension  . Follow-up    6 month    HPI: [name]  is a 86 y.o. female  with hypertension, hyperlipidemia, spinal stenosis, PAD with left heel ulceration,  s/p angioplasty to left SFA on 09/27/18 and noted to have dissection due to severely calcified and heavy plaque burden to left common femoral artery after the procedure, in which she had urgent patch angioplasty with Dr. Doren Custard also on 09/27/18.    The patient presents for 6 month follow up accompanied by her daughter. The patient has been becoming gradually generally weaker over time. She has had two falls recently after losing her balance. She reports joint pains from osteoarthritis. Denies chest pain, shortness of breath, claudication, dizziness.  Past Medical History:  Diagnosis Date  . Adenomatous colon polyp 2007  . Arthritis   . Bilateral carotid bruits 07/27/2018  . CAD (coronary artery disease)   . Chronic back pain   . Gall stone pancreatitis   . Heart murmur   . High cholesterol   . Hx: UTI (urinary tract infection)    Now infrequent  . Hypertension   . Left knee pain Nov. 18, 2014  . Onychomycosis   . Osteoarthritis   . Osteopenia   . PAD (peripheral artery disease) ([place])   . PVD (peripheral vascular disease) ([place])   . Skin ulcer of left heel ([place])   . Trigger finger, right   . Vertigo     Past Surgical History:  Procedure Laterality Date  . ABDOMINAL HYSTERECTOMY     partial  . CHOLECYSTECTOMY  10/30/2011   Procedure: LAPAROSCOPIC CHOLECYSTECTOMY WITH INTRAOPERATIVE CHOLANGIOGRAM;  Surgeon: Zenovia Jarred, MD;  Location: [facility];  Service: General;  Laterality: N/A;  . ENDARTERECTOMY FEMORAL Left 09/27/2018   Procedure: left Endarterectomy Femoral;  Surgeon: Angelia Mould, MD;  Location: [facility];  Service: Vascular;  Laterality: Left;  . EUS  11/11/2011   Procedure: ESOPHAGEAL ENDOSCOPIC ULTRASOUND (EUS) RADIAL;  Surgeon: Arta Silence, MD;  Location: [facility];  Service: Endoscopy;  Laterality: N/A;  possible ERCP  . EYE SURGERY    . FEMORAL-POPLITEAL BYPASS GRAFT Left 09/27/2018   Procedure: left FEMORAL THROMBECTOMY;  Surgeon: Angelia Mould, MD;  Location: [place];  Service: Vascular;  Laterality: Left;  . JOINT REPLACEMENT Left 06-22-06   Hip  . JOINT REPLACEMENT Right 11-26-06   Shoulder  . JOINT REPLACEMENT Right 02-27-06   Knee  . LOWER EXTREMITY ANGIOGRAPHY N/A 09/27/2018   Procedure: LOWER EXTREMITY ANGIOGRAPHY;  Surgeon: Nigel Mormon, MD;  Location: [facility];  Service: Cardiovascular;  Laterality: N/A;  . PATCH ANGIOPLASTY Left 09/27/2018   Procedure: Patch Angioplasty of left femoral artery using xenosure bovine pericardium patch;  Surgeon: Angelia Mould, MD;  Location: [facility];  Service: Vascular;  Laterality: Left;  . PERIPHERAL VASCULAR INTERVENTION  09/27/2018   Procedure: PERIPHERAL VASCULAR INTERVENTION;  Surgeon: Nigel Mormon, MD;  Location: [facility];  Service: Cardiovascular;;  . TOTAL HIP ARTHROPLASTY    . TOTAL KNEE ARTHROPLASTY    . TOTAL SHOULDER ARTHROPLASTY      Social History   Tobacco Use  . Smoking status: Never Smoker  . Smokeless tobacco: Never Used  Substance Use Topics  . Alcohol use: No    Alcohol/week: 0.0 standard drinks   Marital Status: Widowed  Review of Systems  Cardiovascular: Negative for chest pain and dyspnea on exertion.  Musculoskeletal: Positive for arthritis, back pain and muscle weakness.   Objective:  Blood pressure 132/62, pulse 72, resp. rate 16, height 5' (1.524 m), weight 131 lb (59.4 kg), SpO2 (!) 88 %. Body mass index is 25.58 kg/m.  Vitals with BMI 02/12/2020 01/19/2020 08/09/2019  Height 5\' 0"  5\' 0"  5\' 0"   Weight 131 lbs 130 lbs 126  lbs  BMI 25.58 77.82 42.35  Systolic 361 443 154  Diastolic 62 77 80  Pulse 72 84 79       Physical Exam Vitals reviewed.  Constitutional:      General: She is not in acute distress.    Appearance: She is well-developed.     Comments: Petite, frail   Neck:     Vascular: No JVD.  Cardiovascular:     Rate and Rhythm: Normal rate and regular rhythm.     Pulses:          Carotid pulses are on the right side with bruit and on the left side with bruit.      Femoral pulses are 1+ on the right side and 1+ on the left side.      Popliteal pulses are 1+ on the right side and 1+ on the left side.       Dorsalis pedis pulses are 2+ on the right side and 2+ on the left side.       Posterior tibial pulses are 1+ on the right side and 1+ on the left side.     Heart sounds: Murmur heard.  Early systolic murmur is present with a grade of 2/6. Aortic Area    No gallop.      Comments: Trace  LE edema. No JVD.  No ulceration.  Pulmonary:     Effort: Pulmonary effort is normal.     Breath sounds: Normal breath sounds.  Abdominal:     General: Bowel sounds are normal.     Palpations: Abdomen is soft.    Radiology:  MRI of brain 01/18/2019:  Motion degraded study Negative for acute infarct. Atrophy and moderate to extensive chronic small vessel ischemia.  CT of head 01/17/2019: No acute intracranial hemorrhage or evidence of acute demarcated cortical infarction. ASPECTS 10 Redemonstrated advanced chronic small vessel ischemic disease with chronic left thalamic lacunar infarct. Moderate generalized parenchymal atrophy.  Laboratory examination:   CMP Latest Ref Rng & Units 01/21/2019 01/20/2019 01/19/2019  Glucose 70 - 99 mg/dL 118(H) 107(H) 90  BUN 8 - 23 mg/dL 10 9 11   Creatinine 0.44 - 1.00 mg/dL 0.76 0.82 0.74  Sodium 135 - 145 mmol/L 139 139 139  Potassium 3.5 - 5.1 mmol/L 3.3(L) 3.4(L) 3.9  Chloride 98 - 111 mmol/L 103 100 105  CO2 22 - 32 mmol/L 27 28 24   Calcium 8.9 - 10.3  mg/dL 8.7(L) 8.9 8.7(L)  Total Protein 6.5 - 8.1 g/dL - - -  Total Bilirubin 0.3 - 1.2 mg/dL - - -  Alkaline Phos 38 - 126 U/L - - -  AST 15 - 41 U/L - - -  ALT 0 - 44 U/L - - -   CBC Latest Ref Rng & Units 01/21/2019 01/18/2019 01/17/2019  WBC 4.0 - 10.5 K/uL 8.4 9.8 -  Hemoglobin 12.0 - 15.0 g/dL 11.8(L) 10.7(L) 12.9  Hematocrit 36 -  46 % 36.3 33.0(L) 38.0  Platelets 150 - 400 K/uL 215 193 -    Lipid Panel No results for input(s): CHOL, TRIG, LDLCALC, VLDL, HDL, CHOLHDL, LDLDIRECT in the last 8760 hours.  HEMOGLOBIN A1C Lab Results  Component Value Date   HGBA1C 5.4 01/18/2019   MPG 108.28 01/18/2019   TSH No results for input(s): TSH in the last 8760 hours.  Current Outpatient Medications on File Prior to Visit  Medication Sig Dispense Refill  . acetaminophen (TYLENOL) 325 MG tablet Take 650 mg by mouth every 6 (six) hours as needed. Take with Ultram    . amLODipine (NORVASC) 10 MG tablet Take 1 tablet (10 mg total) by mouth daily. 90 tablet 3  . aspirin 81 MG tablet Take 81 mg by mouth daily.    . Carboxymethylcellulose Sodium (THERATEARS) 0.25 % SOLN Apply to eye.    . cetirizine (ZYRTEC) 10 MG tablet Take 10 mg by mouth daily.    . Cholecalciferol 25 MCG (1000 UT) tablet 2,000 Units.     Marland Kitchen ketoconazole (NIZORAL) 2 % shampoo Apply 1 application topically 2 (two) times a week.    . mineral oil-hydrophilic petrolatum (AQUAPHOR) ointment Apply topically as needed for dry skin.    . NON FORMULARY Duoderm patch - 1 patch to (L) buttock after washing with soap and water and drying quick    . NONFORMULARY OR COMPOUNDED ITEM as needed. Baclofen 5%, diclofenac 3%, Gabapentin 6% mixed with 2 gm of Lidocaine    . pravastatin (PRAVACHOL) 20 MG tablet Take 1 tablet (20 mg total) by mouth daily at 6 PM. 30 tablet 0  . sertraline (ZOLOFT) 50 MG tablet Take 75 mg by mouth daily.     . traMADol (ULTRAM) 50 MG tablet Take 25 mg by mouth 3 (three) times daily.      No current  facility-administered medications on file prior to visit.    Cardiac Studies:   Vasc US with ABI 09/29/2018:  Right: Resting right ankle-brachial index is within normal range. No evidence of significant right lower extremity arterial disease. Left: Resting left ankle-brachial index is within normal range. No evidence of significant left lower extremity arterial disease.  Peripheral arteriogram and angioplasty to CTO left SFA 09/27/2018: 1. Ultrasound guided right common femoral artery and left anterior tibial artery access 2. Abdominal aortogram 3. Crossover technique into left common iliac artery 4. Nonselective bilateral lower extremity distal runoff 5. Selective left lower extremity runoff 6. Successful PTCA and stenting Lt SFA with 3 overlapping stents     5.0 X 150 mm, 5.0 X 120 mm, 5.0 X 60 mm Innova self exapanding stents from distal to ostial Lt SFA.  7. Hemostasis 6/7 Fr Mynx closure right common femoral artery 8. Conscious sedation monitoring 228 min  Lexiscan myoview stress test 10/19/2016: 1. The resting electrocardiogram demonstrated normal sinus rhythm, normal resting conduction, no resting arrhythmias and normal rest repolarization. Resting EKG shows NSR. Stress EKG is non-diagnostic for ischemia as it a pharmacologic stress using Lexiscan. Stress symptoms included dyspnea. There was rare PVC and PAC. 2. Myocardial perfusion imaging is normal. Overall left ventricular systolic function was normal without regional wall motion abnormalities. The left ventricular ejection fraction was 74%.  Carotid artery duplex 02/24/2018: Bilateral carotid arteries show mild heterogeneous plaque without stenosis. Antegrade right vertebral artery flow. Antegrade left vertebral artery flow. No significant change since 02/19/2016.  Echocardiogram 01/25/2019 The left ventricle has hyperdynamic systolic function, with an ejection fraction of >65%. The cavity size  was normal. Left ventricular  diastolic function could not be evaluated due to nondiagnostic images. Elevated left ventricular end-diastolic pressure. The right ventricle has normal systolic function. The cavity was normal. There is no increase in right ventricular wall thickness. Right ventricular systolic pressure is mildly elevated. Right atrial size was severely dilated. The aortic valve was not well visualized. Severely thickening of the aortic valve. Severe calcifcation of the aortic valve. Aortic valve regurgitation is moderate by color flow Doppler. Mild stenosis of the aortic valve. Severe aortic annular calcification noted. The mitral valve is degenerative. Moderate thickening of the mitral valve leaflet. Mild calcification of the mitral valve leaflet. There is mild to moderate mitral annular calcification present. The aorta is normal unless otherwise noted. The inferior vena cava was dilated in size with <50% respiratory variability.  EKG:  EKG 02/12/2020: Normal sinus rhythm at rate of 76 bpm, normal axis.  Incomplete right bundle branch block.  Poor R wave progression, probably normal variant.  No evidence of ischemia.   EKG 08/09/2019: Normal sinus rhythm at the rate of 80 bpm, left atrial enlargement, normal axis.  No evidence of ischemia.  Assessment:     ICD-10-CM   1. Essential hypertension  I10 EKG 12-Lead  2. PAD (peripheral artery disease) (HCC)  I73.9   3. Hypercholesterolemia  E78.00    No orders of the defined types were placed in this encounter.  Medications Discontinued During This Encounter  Medication Reason  . antiseptic oral rinse (BIOTENE) LIQD No longer needed (for PRN medications)  . hydrALAZINE (APRESOLINE) 25 MG tablet Patient Preference  . hydrALAZINE (APRESOLINE) 25 MG tablet Patient Preference  . losartan (COZAAR) 100 MG tablet Patient Preference   Recommendations:   [name]  is  A 86 y.o. female  with hypertension, hyperlipidemia, spinal stenosis, PAD with left heel  ulceration. Patient underwent complex angioplasty to left SFA on 09/27/18 and noted to have dissection to left femoral at the end of the case. She urgently went for left femoral endarterectomy with bovine pericardial patch angioplasty with Dr. Scot Dock.    The patient presents for 6 month follow up. With regard to peripheral artery disease, her vascular examination is unchanged. She has not had any new lower extremity ulcerations. She is presently on aspirin. She is also taking a statin. Her blood pressure is well controlled on amlodipine and valsartan. From a cardiovascular standpoint she is stable and doing well, she remains asymptomatic. No changes to her medications were made.  The patient's daughter notes she has become increasingly weak gradually over time, most likely related to deconditioning. She has difficulty walking due to osteoarthritis and is reluctant to walk due to fear of falling. Patient and family are encouraged to work with physical therapy at the nursing home to ambulate daily and build strength.   She can follow up on an as needed basis.   Blair Heys, PA Student 02/12/20 5:32 PM   Patient seen and examined in conjunction with Blair Heys, PA second year student at [employer].  Time spent is in direct patient face to face encounter not including the teaching and training involved.    Adrian Prows, MD, [facility] 02/12/2020, 5:34 PM Office: [phone]

## 2020-02-13 ENCOUNTER — Emergency Department (HOSPITAL_COMMUNITY): Payer: Medicare Other

## 2020-02-13 ENCOUNTER — Inpatient Hospital Stay (HOSPITAL_COMMUNITY): Payer: Medicare Other

## 2020-02-13 ENCOUNTER — Telehealth: Payer: Self-pay | Admitting: Neurology

## 2020-02-13 ENCOUNTER — Encounter (HOSPITAL_COMMUNITY): Payer: Self-pay | Admitting: Emergency Medicine

## 2020-02-13 ENCOUNTER — Inpatient Hospital Stay (HOSPITAL_COMMUNITY)
Admission: EM | Admit: 2020-02-13 | Discharge: 2020-02-15 | DRG: 177 | Disposition: A | Discharge status: Skilled Nursing Facility | Payer: Medicare Other | Attending: Family Medicine | Admitting: Family Medicine

## 2020-02-13 DIAGNOSIS — R0902 Hypoxemia: Secondary | ICD-10-CM | POA: Diagnosis not present

## 2020-02-13 DIAGNOSIS — I6602 Occlusion and stenosis of left middle cerebral artery: Secondary | ICD-10-CM | POA: Diagnosis not present

## 2020-02-13 DIAGNOSIS — I10 Essential (primary) hypertension: Secondary | ICD-10-CM | POA: Diagnosis present

## 2020-02-13 DIAGNOSIS — R52 Pain, unspecified: Secondary | ICD-10-CM | POA: Diagnosis not present

## 2020-02-13 DIAGNOSIS — F0390 Unspecified dementia without behavioral disturbance: Secondary | ICD-10-CM | POA: Diagnosis present

## 2020-02-13 DIAGNOSIS — R29708 NIHSS score 8: Secondary | ICD-10-CM | POA: Diagnosis not present

## 2020-02-13 DIAGNOSIS — R404 Transient alteration of awareness: Secondary | ICD-10-CM | POA: Diagnosis not present

## 2020-02-13 DIAGNOSIS — Z87440 Personal history of urinary (tract) infections: Secondary | ICD-10-CM | POA: Diagnosis not present

## 2020-02-13 DIAGNOSIS — Z79899 Other long term (current) drug therapy: Secondary | ICD-10-CM

## 2020-02-13 DIAGNOSIS — N390 Urinary tract infection, site not specified: Secondary | ICD-10-CM | POA: Diagnosis not present

## 2020-02-13 DIAGNOSIS — B952 Enterococcus as the cause of diseases classified elsewhere: Secondary | ICD-10-CM | POA: Diagnosis not present

## 2020-02-13 DIAGNOSIS — R410 Disorientation, unspecified: Secondary | ICD-10-CM

## 2020-02-13 DIAGNOSIS — Z20822 Contact with and (suspected) exposure to covid-19: Secondary | ICD-10-CM | POA: Diagnosis present

## 2020-02-13 DIAGNOSIS — I7090 Unspecified atherosclerosis: Secondary | ICD-10-CM | POA: Diagnosis not present

## 2020-02-13 DIAGNOSIS — J918 Pleural effusion in other conditions classified elsewhere: Secondary | ICD-10-CM | POA: Diagnosis present

## 2020-02-13 DIAGNOSIS — J189 Pneumonia, unspecified organism: Secondary | ICD-10-CM | POA: Diagnosis not present

## 2020-02-13 DIAGNOSIS — Z959 Presence of cardiac and vascular implant and graft, unspecified: Secondary | ICD-10-CM | POA: Diagnosis not present

## 2020-02-13 DIAGNOSIS — R29712 NIHSS score 12: Secondary | ICD-10-CM | POA: Diagnosis present

## 2020-02-13 DIAGNOSIS — Z9862 Peripheral vascular angioplasty status: Secondary | ICD-10-CM

## 2020-02-13 DIAGNOSIS — R29706 NIHSS score 6: Secondary | ICD-10-CM | POA: Diagnosis not present

## 2020-02-13 DIAGNOSIS — I350 Nonrheumatic aortic (valve) stenosis: Secondary | ICD-10-CM | POA: Diagnosis not present

## 2020-02-13 DIAGNOSIS — G9341 Metabolic encephalopathy: Secondary | ICD-10-CM | POA: Diagnosis present

## 2020-02-13 DIAGNOSIS — I6389 Other cerebral infarction: Secondary | ICD-10-CM | POA: Diagnosis not present

## 2020-02-13 DIAGNOSIS — I2510 Atherosclerotic heart disease of native coronary artery without angina pectoris: Secondary | ICD-10-CM | POA: Diagnosis present

## 2020-02-13 DIAGNOSIS — B351 Tinea unguium: Secondary | ICD-10-CM | POA: Diagnosis present

## 2020-02-13 DIAGNOSIS — G8929 Other chronic pain: Secondary | ICD-10-CM | POA: Diagnosis present

## 2020-02-13 DIAGNOSIS — E7800 Pure hypercholesterolemia, unspecified: Secondary | ICD-10-CM

## 2020-02-13 DIAGNOSIS — R4182 Altered mental status, unspecified: Secondary | ICD-10-CM | POA: Diagnosis not present

## 2020-02-13 DIAGNOSIS — Z8673 Personal history of transient ischemic attack (TIA), and cerebral infarction without residual deficits: Secondary | ICD-10-CM | POA: Diagnosis not present

## 2020-02-13 DIAGNOSIS — Z7982 Long term (current) use of aspirin: Secondary | ICD-10-CM

## 2020-02-13 DIAGNOSIS — Z882 Allergy status to sulfonamides status: Secondary | ICD-10-CM

## 2020-02-13 DIAGNOSIS — Z23 Encounter for immunization: Secondary | ICD-10-CM

## 2020-02-13 DIAGNOSIS — I639 Cerebral infarction, unspecified: Secondary | ICD-10-CM

## 2020-02-13 DIAGNOSIS — M1990 Unspecified osteoarthritis, unspecified site: Secondary | ICD-10-CM | POA: Diagnosis present

## 2020-02-13 DIAGNOSIS — I6621 Occlusion and stenosis of right posterior cerebral artery: Secondary | ICD-10-CM | POA: Diagnosis not present

## 2020-02-13 DIAGNOSIS — J690 Pneumonitis due to inhalation of food and vomit: Secondary | ICD-10-CM | POA: Diagnosis present

## 2020-02-13 DIAGNOSIS — I6381 Other cerebral infarction due to occlusion or stenosis of small artery: Secondary | ICD-10-CM | POA: Diagnosis present

## 2020-02-13 DIAGNOSIS — R29704 NIHSS score 4: Secondary | ICD-10-CM | POA: Diagnosis not present

## 2020-02-13 DIAGNOSIS — N183 Chronic kidney disease, stage 3 unspecified: Secondary | ICD-10-CM | POA: Diagnosis not present

## 2020-02-13 DIAGNOSIS — I6782 Cerebral ischemia: Secondary | ICD-10-CM | POA: Diagnosis not present

## 2020-02-13 DIAGNOSIS — I69351 Hemiplegia and hemiparesis following cerebral infarction affecting right dominant side: Secondary | ICD-10-CM | POA: Diagnosis not present

## 2020-02-13 DIAGNOSIS — G936 Cerebral edema: Secondary | ICD-10-CM | POA: Diagnosis not present

## 2020-02-13 DIAGNOSIS — E785 Hyperlipidemia, unspecified: Secondary | ICD-10-CM | POA: Diagnosis present

## 2020-02-13 DIAGNOSIS — Z66 Do not resuscitate: Secondary | ICD-10-CM | POA: Diagnosis present

## 2020-02-13 DIAGNOSIS — Z96651 Presence of right artificial knee joint: Secondary | ICD-10-CM | POA: Diagnosis present

## 2020-02-13 DIAGNOSIS — Z880 Allergy status to penicillin: Secondary | ICD-10-CM

## 2020-02-13 DIAGNOSIS — Z888 Allergy status to other drugs, medicaments and biological substances status: Secondary | ICD-10-CM

## 2020-02-13 DIAGNOSIS — I739 Peripheral vascular disease, unspecified: Secondary | ICD-10-CM | POA: Diagnosis present

## 2020-02-13 DIAGNOSIS — I129 Hypertensive chronic kidney disease with stage 1 through stage 4 chronic kidney disease, or unspecified chronic kidney disease: Secondary | ICD-10-CM | POA: Diagnosis not present

## 2020-02-13 DIAGNOSIS — E440 Moderate protein-calorie malnutrition: Secondary | ICD-10-CM | POA: Diagnosis not present

## 2020-02-13 DIAGNOSIS — E876 Hypokalemia: Secondary | ICD-10-CM | POA: Diagnosis present

## 2020-02-13 DIAGNOSIS — R1312 Dysphagia, oropharyngeal phase: Secondary | ICD-10-CM | POA: Diagnosis not present

## 2020-02-13 DIAGNOSIS — Z8249 Family history of ischemic heart disease and other diseases of the circulatory system: Secondary | ICD-10-CM

## 2020-02-13 DIAGNOSIS — Z96642 Presence of left artificial hip joint: Secondary | ICD-10-CM | POA: Diagnosis present

## 2020-02-13 DIAGNOSIS — K219 Gastro-esophageal reflux disease without esophagitis: Secondary | ICD-10-CM | POA: Diagnosis not present

## 2020-02-13 DIAGNOSIS — R29818 Other symptoms and signs involving the nervous system: Secondary | ICD-10-CM | POA: Diagnosis not present

## 2020-02-13 DIAGNOSIS — Z96611 Presence of right artificial shoulder joint: Secondary | ICD-10-CM | POA: Diagnosis present

## 2020-02-13 DIAGNOSIS — I951 Orthostatic hypotension: Secondary | ICD-10-CM | POA: Diagnosis present

## 2020-02-13 DIAGNOSIS — J90 Pleural effusion, not elsewhere classified: Secondary | ICD-10-CM | POA: Diagnosis not present

## 2020-02-13 DIAGNOSIS — M8580 Other specified disorders of bone density and structure, unspecified site: Secondary | ICD-10-CM | POA: Diagnosis present

## 2020-02-13 DIAGNOSIS — F329 Major depressive disorder, single episode, unspecified: Secondary | ICD-10-CM | POA: Diagnosis not present

## 2020-02-13 DIAGNOSIS — Z79891 Long term (current) use of opiate analgesic: Secondary | ICD-10-CM

## 2020-02-13 DIAGNOSIS — Z90711 Acquired absence of uterus with remaining cervical stump: Secondary | ICD-10-CM

## 2020-02-13 DIAGNOSIS — G319 Degenerative disease of nervous system, unspecified: Secondary | ICD-10-CM | POA: Diagnosis not present

## 2020-02-13 DIAGNOSIS — F32A Depression, unspecified: Secondary | ICD-10-CM | POA: Diagnosis not present

## 2020-02-13 DIAGNOSIS — Z86010 Personal history of colonic polyps: Secondary | ICD-10-CM

## 2020-02-13 LAB — URINALYSIS, ROUTINE W REFLEX MICROSCOPIC
Bilirubin Urine: NEGATIVE
Glucose, UA: NEGATIVE mg/dL
Ketones, ur: 5 mg/dL — AB
Leukocytes,Ua: NEGATIVE
Nitrite: NEGATIVE
Protein, ur: NEGATIVE mg/dL
Specific Gravity, Urine: 1.009 (ref 1.005–1.030)
pH: 6 (ref 5.0–8.0)

## 2020-02-13 LAB — DIFFERENTIAL
Abs Immature Granulocytes: 0.02 10*3/uL (ref 0.00–0.07)
Basophils Absolute: 0.1 10*3/uL (ref 0.0–0.1)
Basophils Relative: 1 %
Eosinophils Absolute: 0.4 10*3/uL (ref 0.0–0.5)
Eosinophils Relative: 4 %
Immature Granulocytes: 0 %
Lymphocytes Relative: 25 %
Lymphs Abs: 2 10*3/uL (ref 0.7–4.0)
Monocytes Absolute: 0.5 10*3/uL (ref 0.1–1.0)
Monocytes Relative: 6 %
Neutro Abs: 5.1 10*3/uL (ref 1.7–7.7)
Neutrophils Relative %: 64 %

## 2020-02-13 LAB — CBC
HCT: 39.5 % (ref 36.0–46.0)
HCT: 38.9 % (ref 36.0–46.0)
Hemoglobin: 12.6 g/dL (ref 12.0–15.0)
Hemoglobin: 12.3 g/dL (ref 12.0–15.0)
MCH: 31.6 pg (ref 26.0–34.0)
MCH: 32.3 pg (ref 26.0–34.0)
MCHC: 31.9 g/dL (ref 30.0–36.0)
MCHC: 31.6 g/dL (ref 30.0–36.0)
MCV: 99 fL (ref 80.0–100.0)
MCV: 102.1 fL — ABNORMAL HIGH (ref 80.0–100.0)
Platelets: 272 10*3/uL (ref 150–400)
Platelets: 255 10*3/uL (ref 150–400)
RBC: 3.99 MIL/uL (ref 3.87–5.11)
RBC: 3.81 MIL/uL — ABNORMAL LOW (ref 3.87–5.11)
RDW: 11.9 % (ref 11.5–15.5)
RDW: 12 % (ref 11.5–15.5)
WBC: 8 10*3/uL (ref 4.0–10.5)
WBC: 9.4 10*3/uL (ref 4.0–10.5)
nRBC: 0 % (ref 0.0–0.2)
nRBC: 0 % (ref 0.0–0.2)

## 2020-02-13 LAB — CBG MONITORING, ED: Glucose-Capillary: 119 mg/dL — ABNORMAL HIGH (ref 70–99)

## 2020-02-13 LAB — COMPREHENSIVE METABOLIC PANEL
ALT: 11 U/L (ref 0–44)
AST: 16 U/L (ref 15–41)
Albumin: 3.4 g/dL — ABNORMAL LOW (ref 3.5–5.0)
Alkaline Phosphatase: 89 U/L (ref 38–126)
Anion gap: 6 (ref 5–15)
BUN: 14 mg/dL (ref 8–23)
CO2: 30 mmol/L (ref 22–32)
Calcium: 9 mg/dL (ref 8.9–10.3)
Chloride: 104 mmol/L (ref 98–111)
Creatinine, Ser: 0.89 mg/dL (ref 0.44–1.00)
GFR calc Af Amer: >60 mL/min (ref >60)
GFR calc non Af Amer: 58 mL/min — ABNORMAL LOW (ref >60)
Glucose, Bld: 124 mg/dL — ABNORMAL HIGH (ref 70–99)
Potassium: 3.5 mmol/L (ref 3.5–5.1)
Sodium: 140 mmol/L (ref 135–145)
Total Bilirubin: 0.8 mg/dL (ref 0.3–1.2)
Total Protein: 5.8 g/dL — ABNORMAL LOW (ref 6.5–8.1)

## 2020-02-13 LAB — I-STAT CHEM 8, ED
BUN: 14 mg/dL (ref 8–23)
Calcium, Ion: 1.21 mmol/L (ref 1.15–1.40)
Chloride: 100 mmol/L (ref 98–111)
Creatinine, Ser: 0.8 mg/dL (ref 0.44–1.00)
Glucose, Bld: 118 mg/dL — ABNORMAL HIGH (ref 70–99)
HCT: 39 % (ref 36.0–46.0)
Hemoglobin: 13.3 g/dL (ref 12.0–15.0)
Potassium: 3.5 mmol/L (ref 3.5–5.1)
Sodium: 141 mmol/L (ref 135–145)
TCO2: 28 mmol/L (ref 22–32)

## 2020-02-13 LAB — I-STAT ARTERIAL BLOOD GAS, ED
Acid-Base Excess: 4 mmol/L — ABNORMAL HIGH (ref 0.0–2.0)
Bicarbonate: 29.4 mmol/L — ABNORMAL HIGH (ref 20.0–28.0)
Calcium, Ion: 1.25 mmol/L (ref 1.15–1.40)
HCT: 36 % (ref 36.0–46.0)
Hemoglobin: 12.2 g/dL (ref 12.0–15.0)
O2 Saturation: 95 %
Patient temperature: 98.8
Potassium: 3.4 mmol/L — ABNORMAL LOW (ref 3.5–5.1)
Sodium: 140 mmol/L (ref 135–145)
TCO2: 31 mmol/L (ref 22–32)
pCO2 arterial: 44.3 mmHg (ref 32.0–48.0)
pH, Arterial: 7.43 (ref 7.350–7.450)
pO2, Arterial: 77 mmHg — ABNORMAL LOW (ref 83.0–108.0)

## 2020-02-13 LAB — RESPIRATORY PANEL BY RT PCR (FLU A&B, COVID)
Influenza A by PCR: NEGATIVE
Influenza B by PCR: NEGATIVE
SARS Coronavirus 2 by RT PCR: NEGATIVE

## 2020-02-13 LAB — PROTIME-INR
INR: 1 (ref 0.8–1.2)
Prothrombin Time: 12.6 seconds (ref 11.4–15.2)

## 2020-02-13 LAB — TSH: TSH: 0.706 u[IU]/mL (ref 0.350–4.500)

## 2020-02-13 LAB — CREATININE, SERUM
Creatinine, Ser: 0.83 mg/dL (ref 0.44–1.00)
GFR calc Af Amer: >60 mL/min (ref >60)
GFR calc non Af Amer: >60 mL/min (ref >60)

## 2020-02-13 LAB — AMMONIA: Ammonia: 19 umol/L (ref 9–35)

## 2020-02-13 LAB — PHOSPHORUS: Phosphorus: 3.1 mg/dL (ref 2.5–4.6)

## 2020-02-13 LAB — MAGNESIUM: Magnesium: 1.6 mg/dL — ABNORMAL LOW (ref 1.7–2.4)

## 2020-02-13 LAB — APTT: aPTT: 26 seconds (ref 24–36)

## 2020-02-13 LAB — LACTIC ACID, PLASMA: Lactic Acid, Venous: 1.4 mmol/L (ref 0.5–1.9)

## 2020-02-13 IMAGING — DX DG CHEST 1V PORT
1 series · 1 of 1 positions shown · non-contrast
Comparison: [DATE]

CLINICAL DATA: Altered mental status

EXAM:
PORTABLE CHEST 1 VIEW

[chest ap]
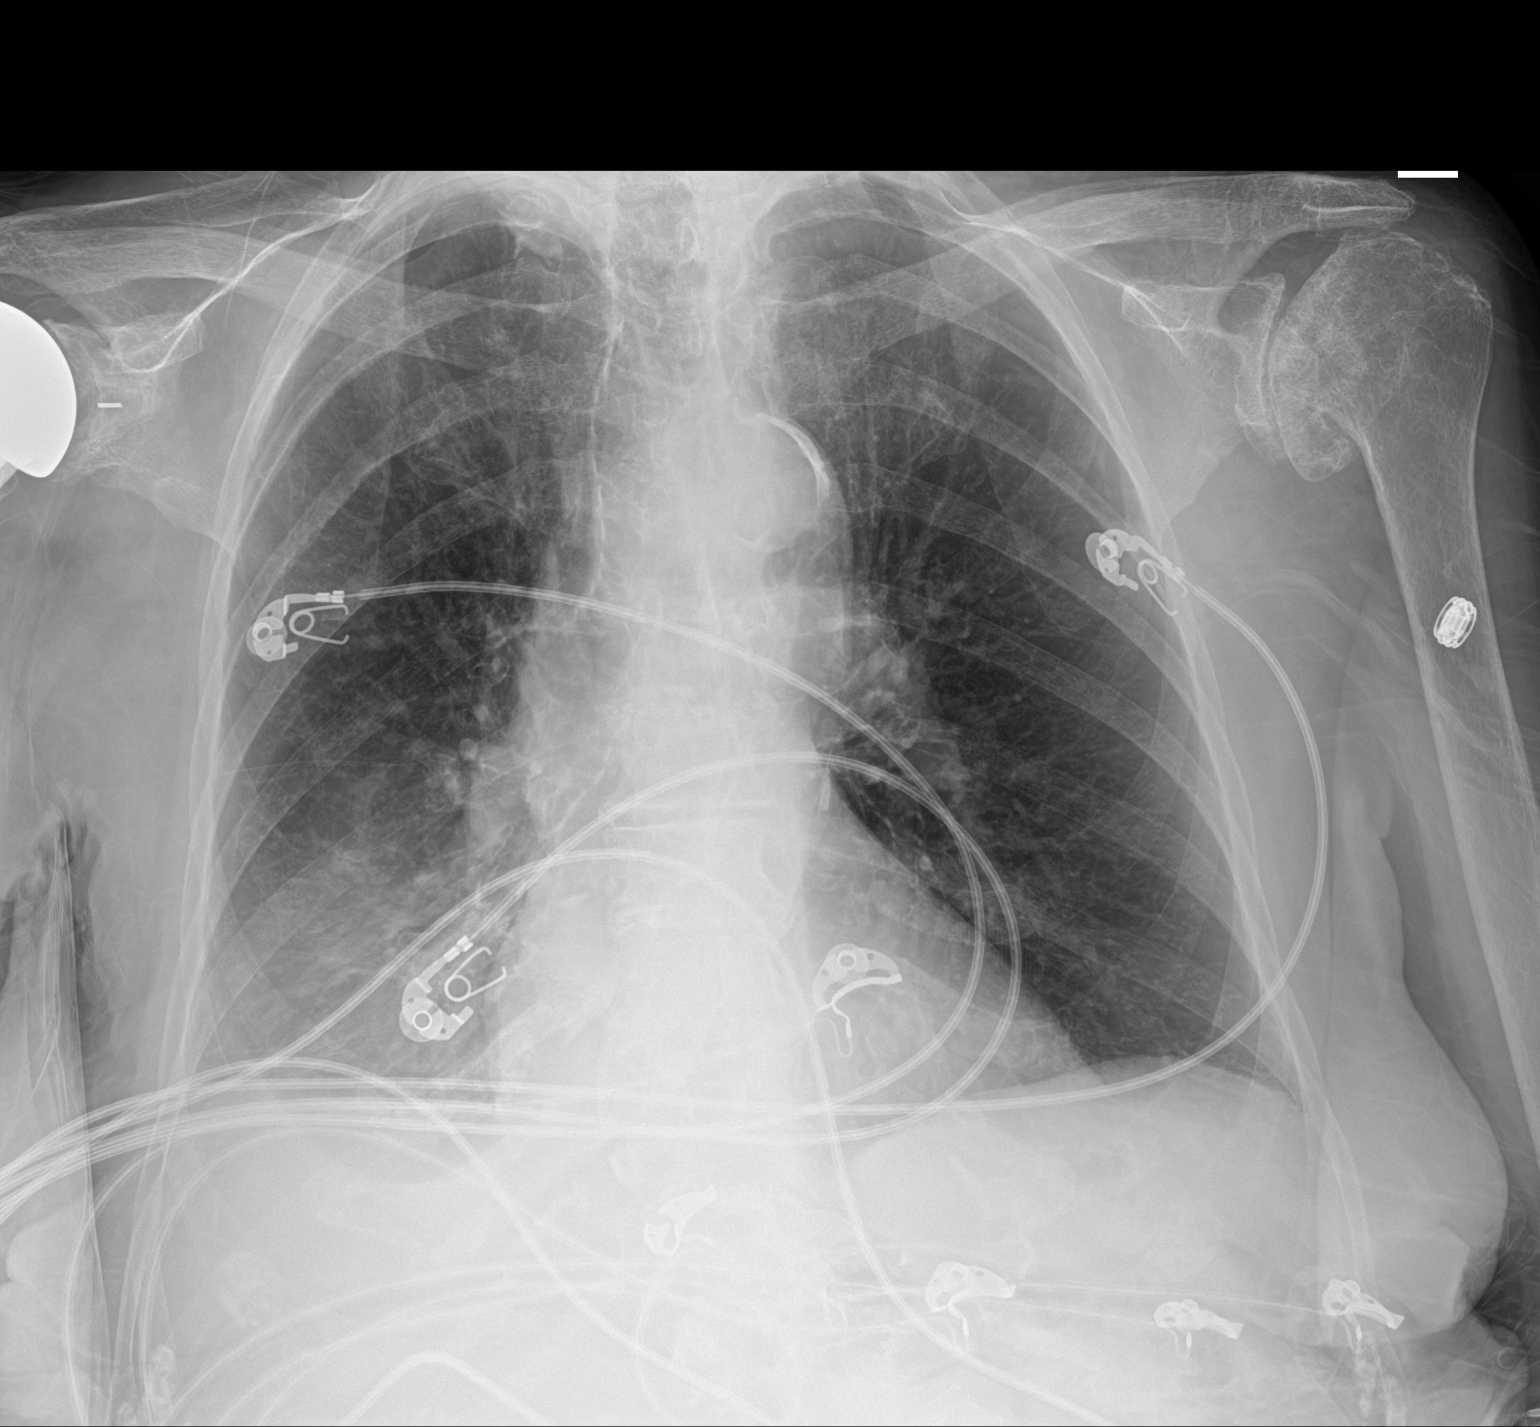

[1 of 1 positions shown; findings below may reference images not displayed]

FINDINGS: Calcified atheromatous plaque of the thoracic aorta. Similar to
prior study. Cardiomediastinal contours and hilar structures with
similar appearance though increased density in the RIGHT infrahilar
region partially obscured RIGHT hemidiaphragm.

Graded opacity in this area suggests concomitant effusion.

On limited assessment skeletal structures without acute process.
Severe LEFT glenohumeral degenerative changes and signs of RIGHT
shoulder arthroplasty incompletely assessed.
IMPRESSION: Increasing density in the RIGHT infrahilar region with concomitant
RIGHT effusion. This may represent pneumonia with concomitant
effusion aspiration could also have this distribution, correlate
with any risk factors for aspiration. Suggest follow-up to ensure
resolution and exclude underlying lesion in this location.

## 2020-02-13 IMAGING — MR MR HEAD W/O CM
7 series · 42 of 48 positions shown · non-contrast
Comparison: CT head from the same day, MRI head from [DATE]

CLINICAL DATA: Acute stroke suspected.

EXAM:
MRI HEAD WITHOUT CONTRAST
TECHNIQUE: Multiplanar, multiecho pulse sequences of the brain and surrounding
structures were obtained without intravenous contrast.

[Series 3: DWI · axial · 3.0mm · 1.09mm/px · z∈[-29,+81]mm · 9 of 92 slices shown (1 of 4)]
[im 1/92]
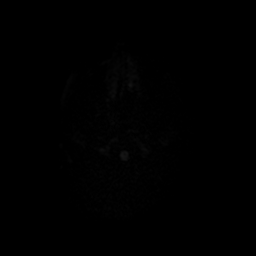
[im 16/92]
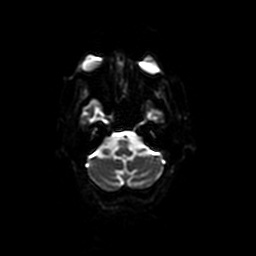
[im 31/92]
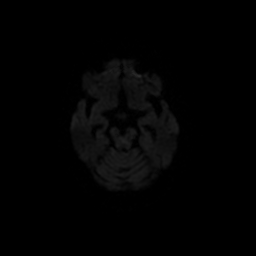
[im 38/92]
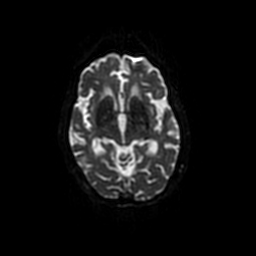
[im 46/92]
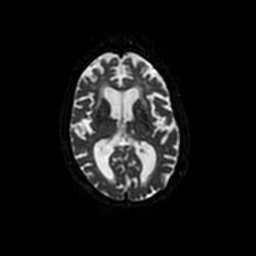
[im 54/92]
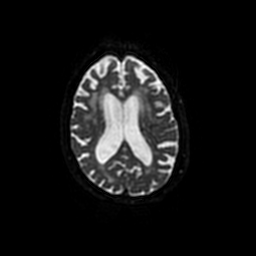
[im 61/92]
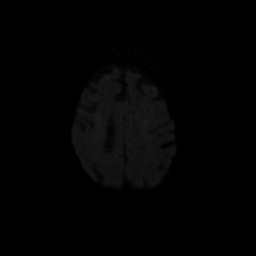
[im 76/92]
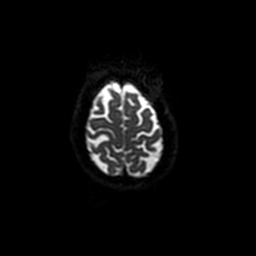
[im 92/92]
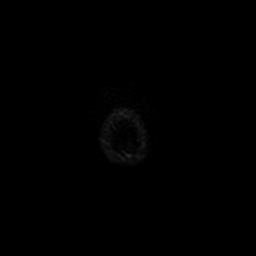

[Series 4: DWI · coronal · 5.0mm · 1.09mm/px · 11 of 72 slices shown (2 of 4)]
[im 1/72]
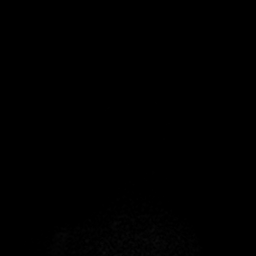
[im 8/72]
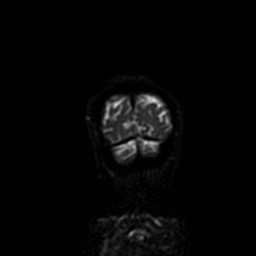
[im 15/72]
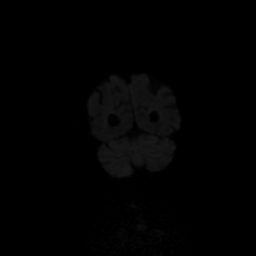
[im 22/72]
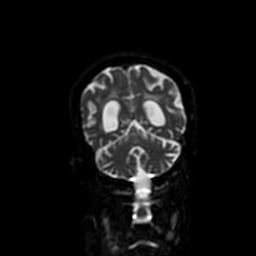
[im 29/72]
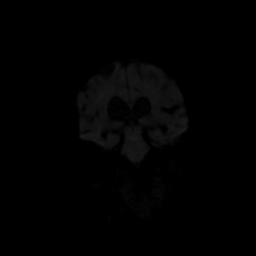
[im 36/72]
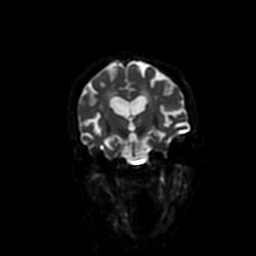
[im 43/72]
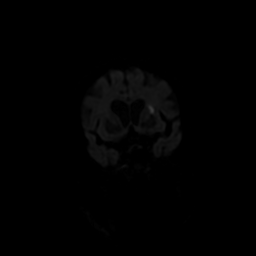
[im 50/72]
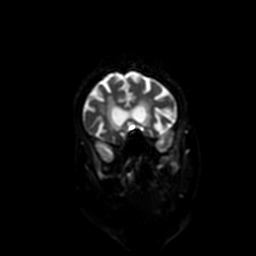
[im 57/72]
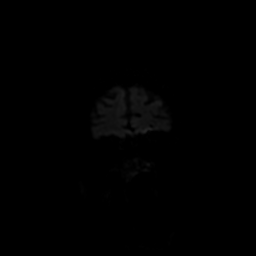
[im 64/72]
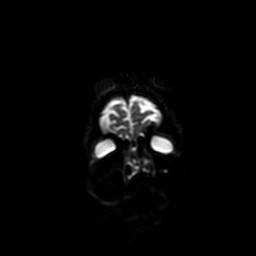
[im 72/72]
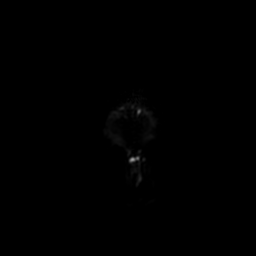

[Series 5: FLAIR · axial · 3.0mm · 0.43mm/px · z∈[-42,+67]mm · 4 of 23 slices shown]
[im 1/23]
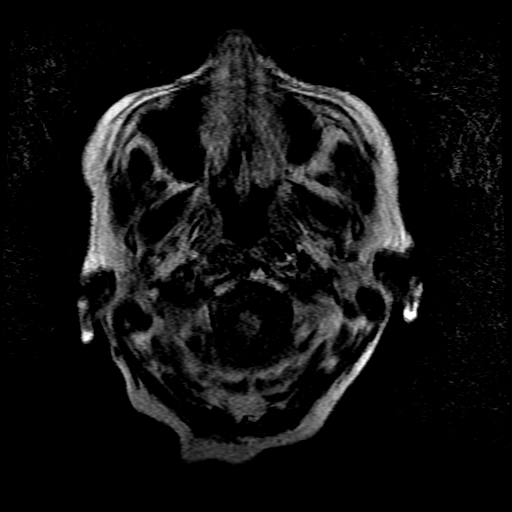
[im 8/23]
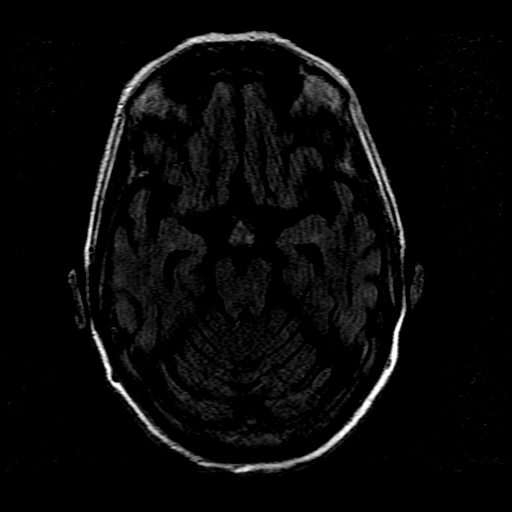
[im 15/23]
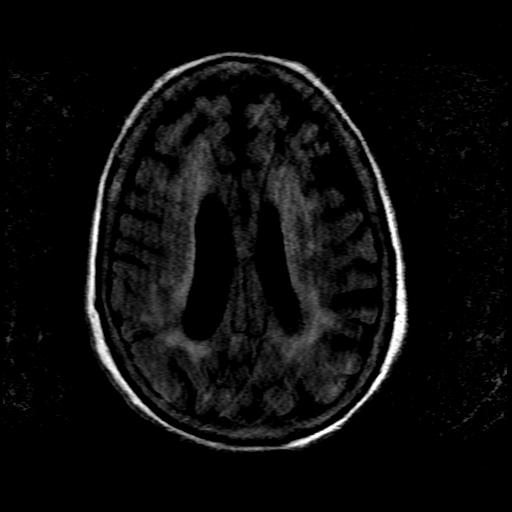
[im 23/23]
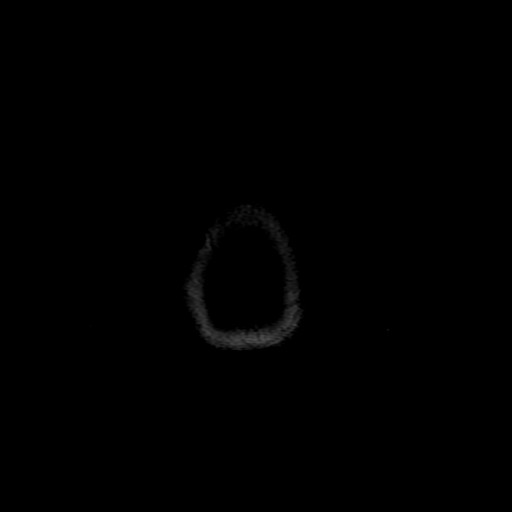

[Series 6: ax mpgr · axial · 5.0mm · 0.43mm/px · 1 of 20 slices shown]
[im 1/20]
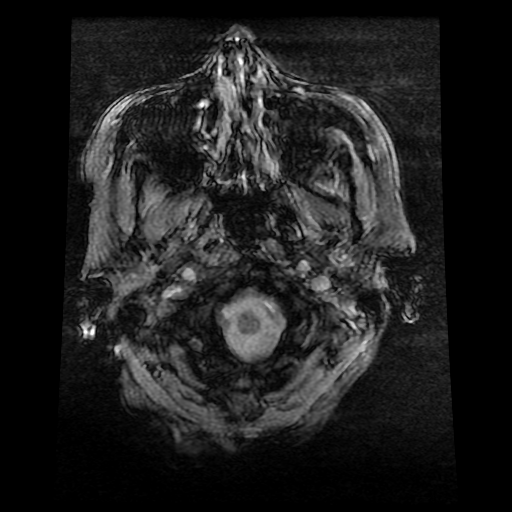

[Series 7: T2 · axial · 5.0mm · 0.43mm/px · z∈[-42,+67]mm · 4 of 23 slices shown]
[im 1/23]
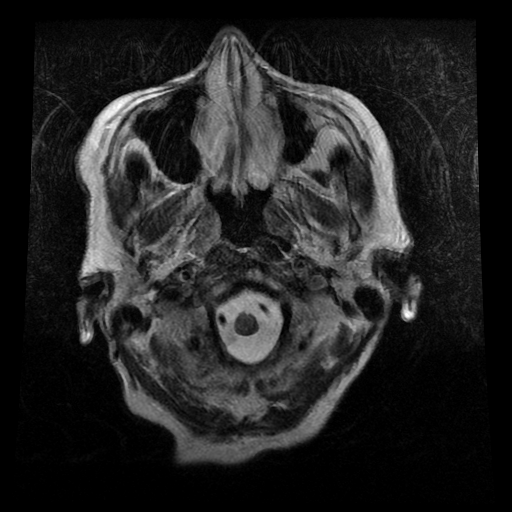
[im 8/23]
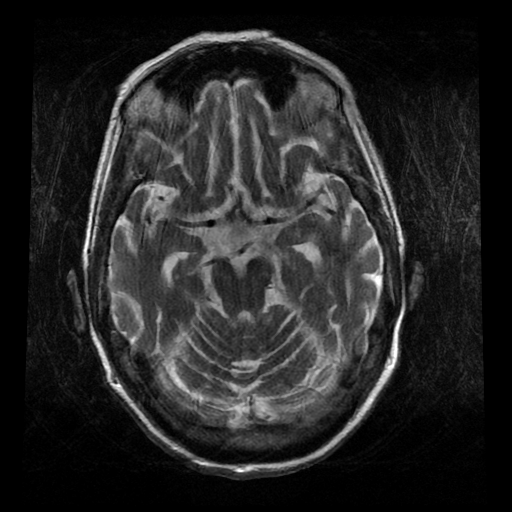
[im 15/23]
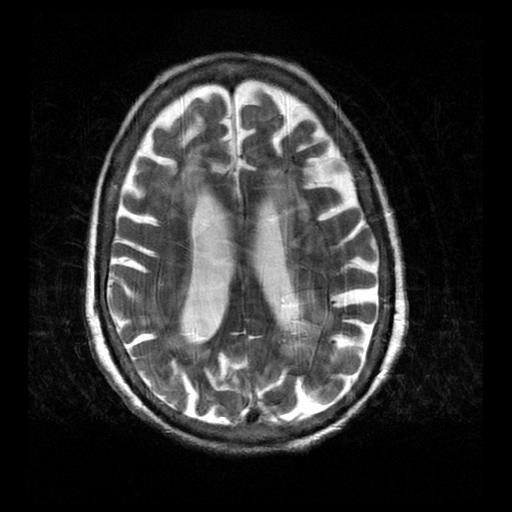
[im 23/23]
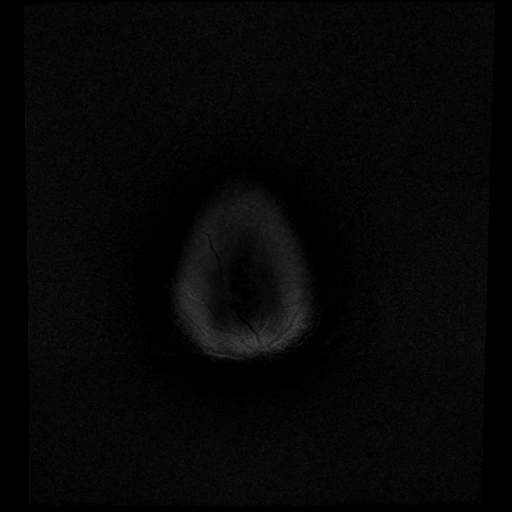

[Series 300: DWI · axial · 3.0mm · 1.09mm/px · z∈[-29,+81]mm · 7 of 46 slices shown (3 of 4)]
[im 1/46]
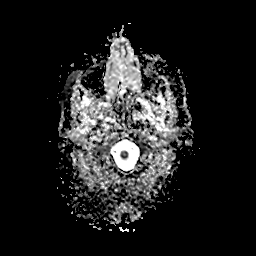
[im 8/46]
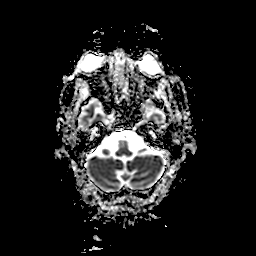
[im 16/46]
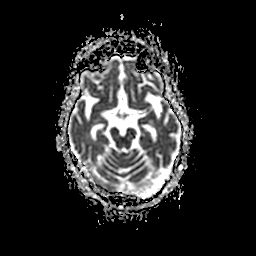
[im 23/46]
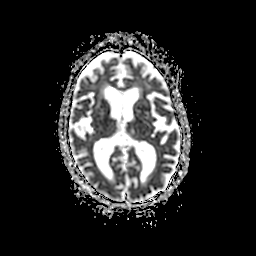
[im 31/46]
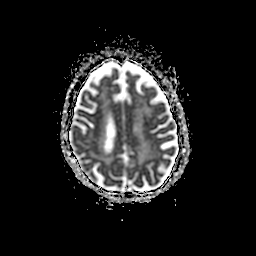
[im 38/46]
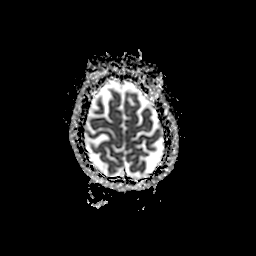
[im 46/46]
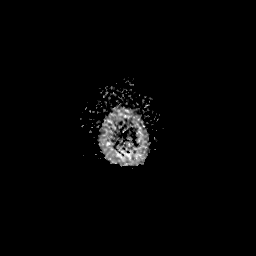

[Series 400: DWI · coronal · 5.0mm · 1.09mm/px · 6 of 36 slices shown (4 of 4)]
[im 1/36]
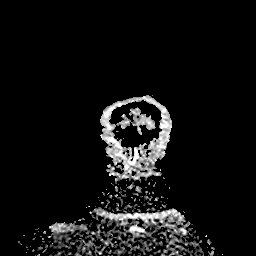
[im 8/36]
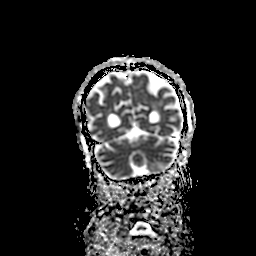
[im 15/36]
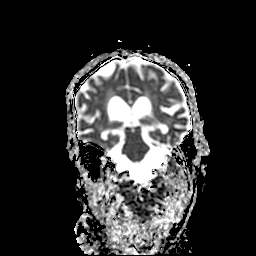
[im 22/36]
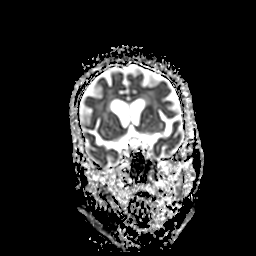
[im 29/36]
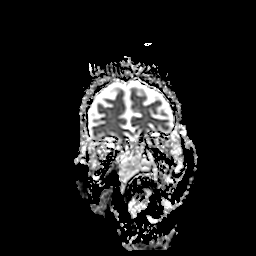
[im 36/36]
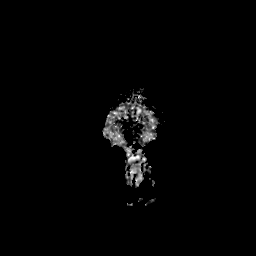

[42 of 48 positions shown; findings below may reference images not displayed]

FINDINGS: Patient was uncooperative and therefore the exam was terminated
early. T1 imaging and T2 coronal reformatted imaging was not
obtained. Imaging that was obtained is limited by patient motion.
Within these limitations:

Brain: Linear restricted diffusion involving the left corona
radiata, compatible with acute infarct. Mild associated edema in
this region without substantial mass effect. Remote left thalamic
lacunar infarct. No acute hemorrhage. There is extensive
periventricular white matter T2/FLAIR hyperintensity, compatible
with chronic microvascular ischemic disease. Generalized cerebral
atrophy with stable ex vacuo ventricular dilation. No hydrocephalus.
No mass lesion or abnormal mass effect.

Vascular: Major proximal arterial flow voids are maintained.

Skull and upper cervical spine: Limited evaluation without evidence
of abnormality.

Sinuses/Orbits: Mild ethmoid air cell mucosal thickening. Air-fluid
level in the right sphenoid sinus. Remaining sinuses are clear. No
acute orbital abnormality.

Other: No mastoid effusion.
IMPRESSION: Incomplete and limited examination as detailed above. Within this
limitation:

1. Acute infarct involving the left corona radiata. No substantial
mass effect.
2. Similar extensive chronic microvascular ischemic change and
generalized cerebral atrophy.

## 2020-02-13 IMAGING — CT CT HEAD W/O CM
4 series · 15 of 47 positions shown, 17 images · non-contrast
Comparison: CT head [DATE]

CLINICAL DATA: Delirium.

EXAM:
CT HEAD WITHOUT CONTRAST
TECHNIQUE: Contiguous axial images were obtained from the base of the skull
through the vertex without intravenous contrast.

[Series 3: head without · axial · non-contrast · 0.41mm/px · z∈[-132,-12]mm · 7 of 34 slices shown, 9 images]
[im 5/34  brain]
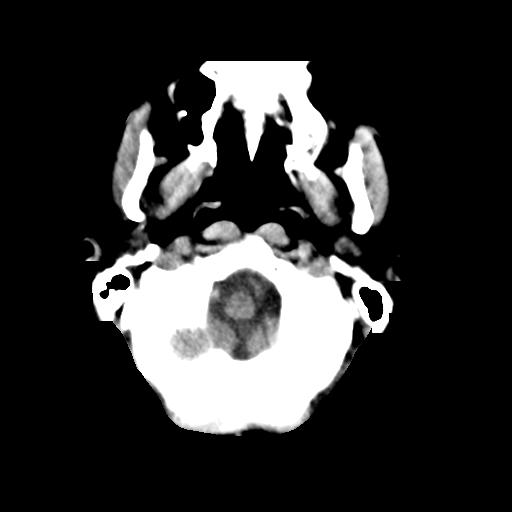
[im 5/34  bone]
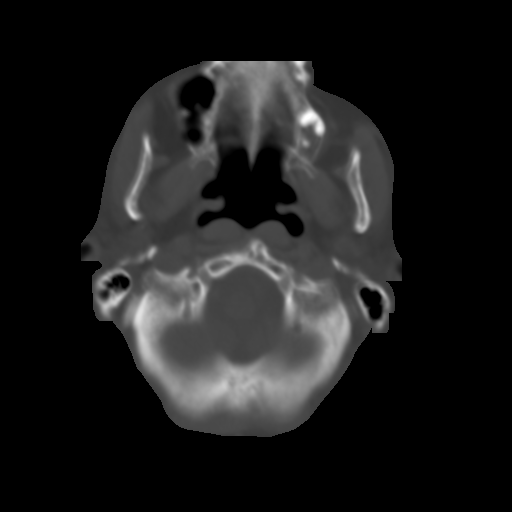
[im 9/34  brain]
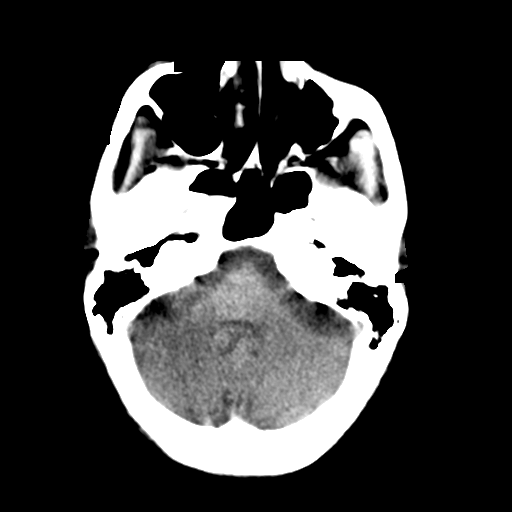
[im 13/34  brain]
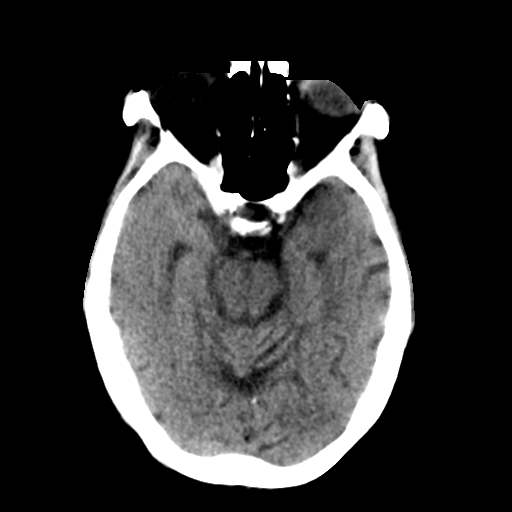
[im 17/34  brain]
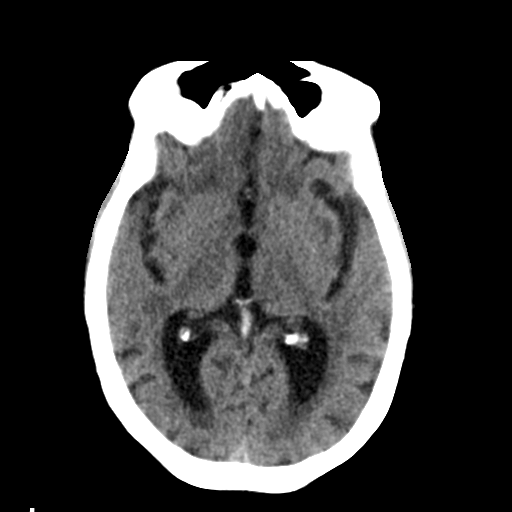
[im 21/34  brain]
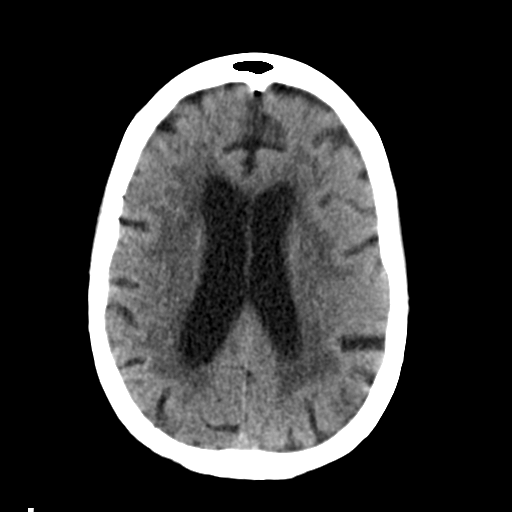
[im 21/34  bone]
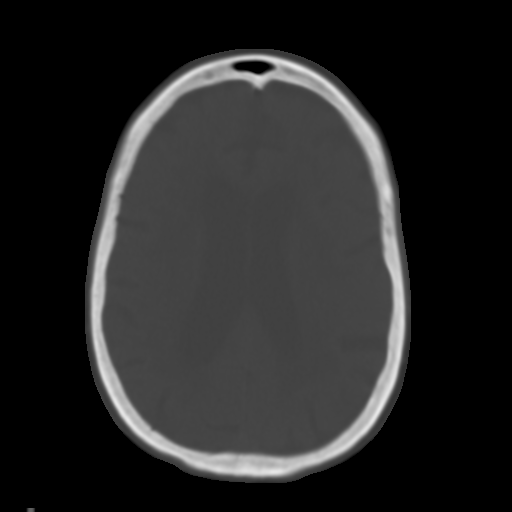
[im 25/34  brain]
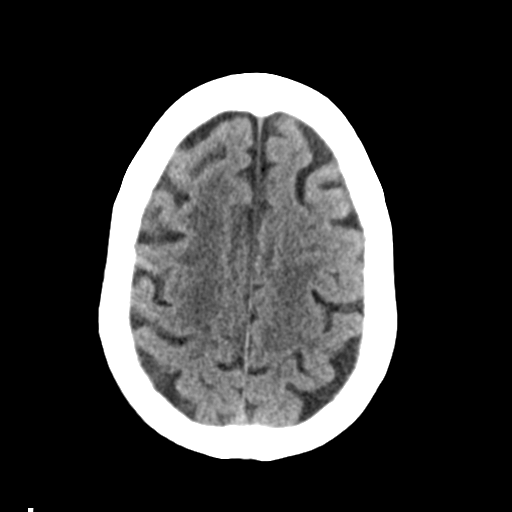
[im 29/34  brain]
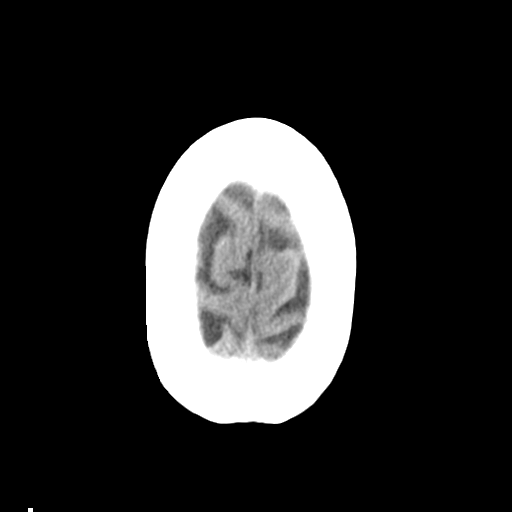

[Series 4: head bone · axial · 0.41mm/px · z∈[-136,-120]mm · 2 of 84 slices shown]
[im 9/84  bone]
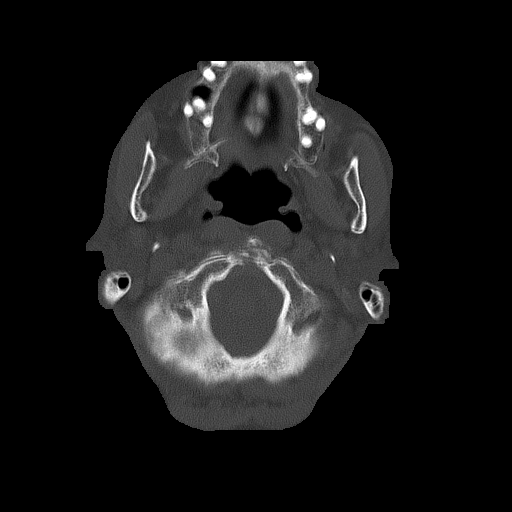
[im 17/84  bone]
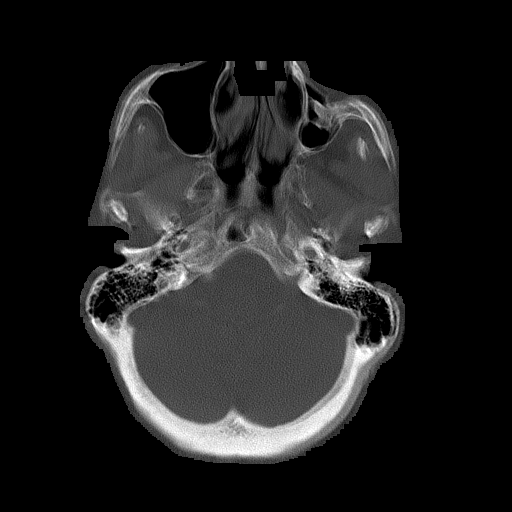

[Series 5: head without cor · coronal · non-contrast · 0.26mm/px · 3 of 70 slices shown]
[im 24/70  brain]
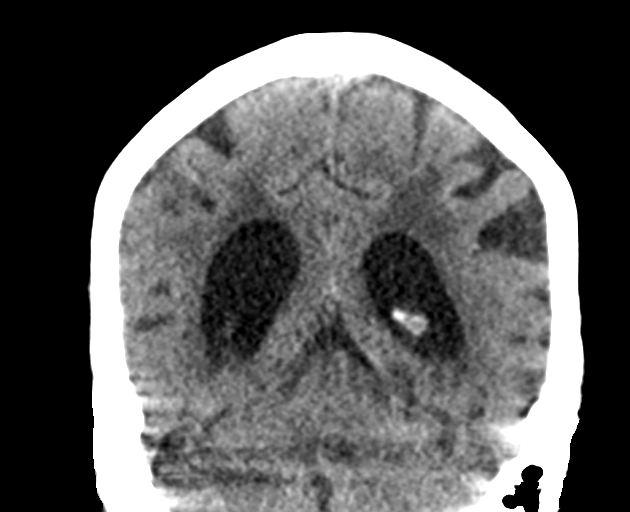
[im 31/70  brain]
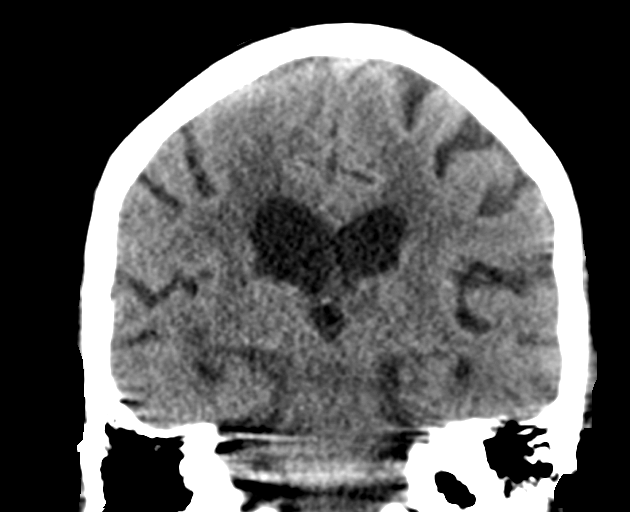
[im 39/70  brain]
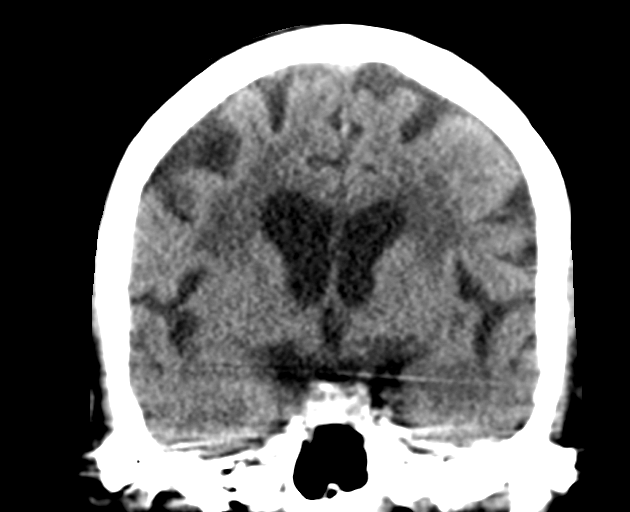

[Series 6: head without sag · sagittal · non-contrast · 0.26mm/px · 3 of 66 slices shown]
[im 22/66  brain]
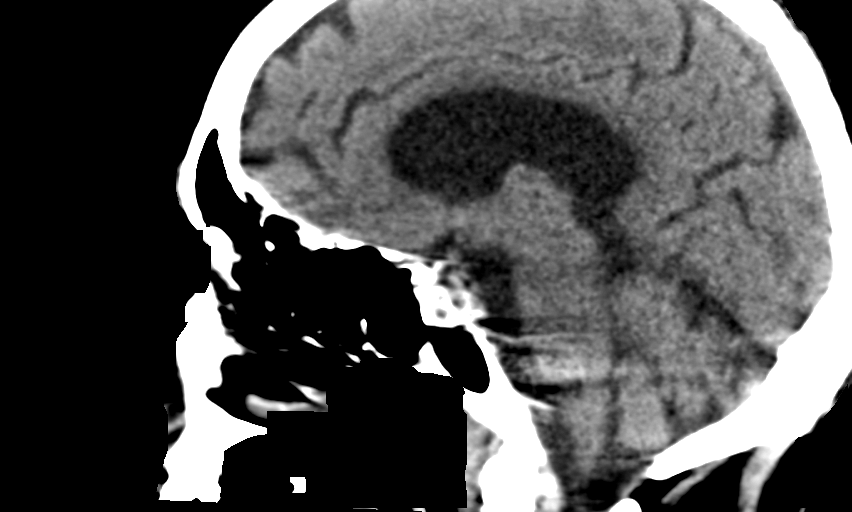
[im 33/66  brain]
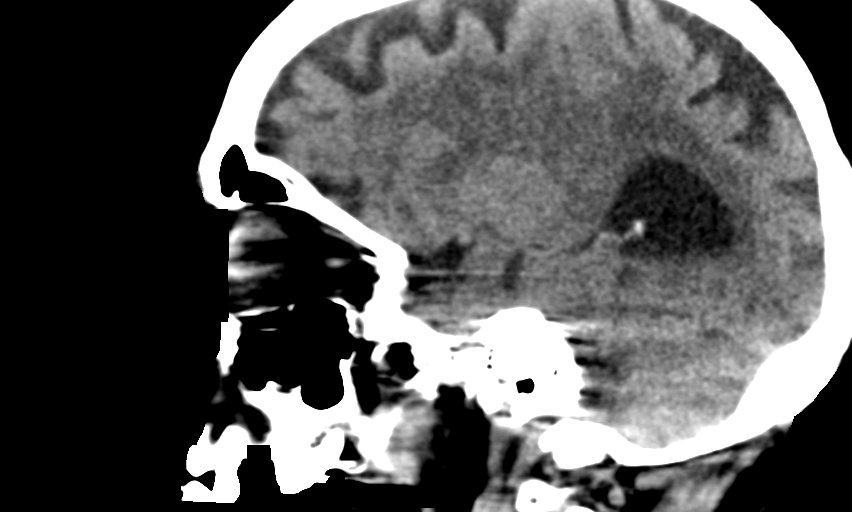
[im 44/66  brain]
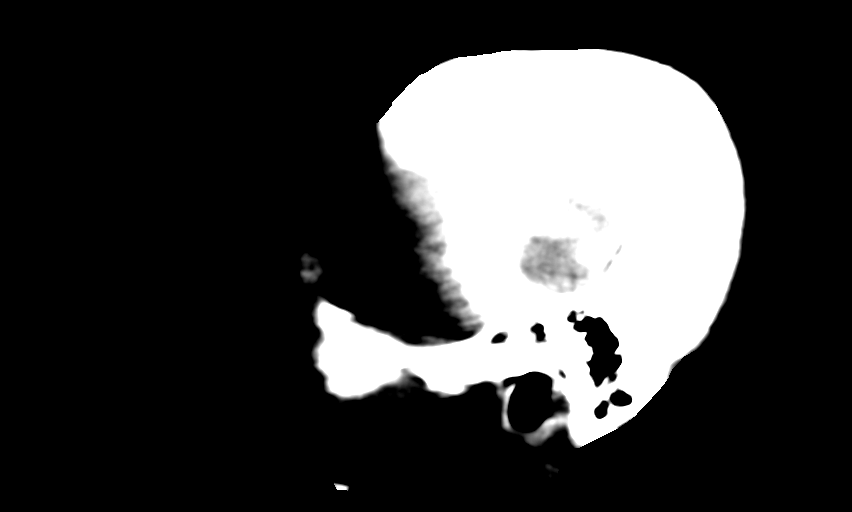

[15 of 47 positions shown; findings below may reference images not displayed]

FINDINGS: Brain: Motion degraded study

Generalized atrophy. Extensive chronic microvascular ischemic change
in the white matter. Chronic infarct left thalamus unchanged.

Negative for acute infarct, hemorrhage, mass.

Vascular: Negative for hyperdense vessel

Skull: Negative

Sinuses/Orbits: Air-fluid level right sphenoid sinus. Remaining
paranasal sinuses clear. Bilateral cataract extraction

Other: None
IMPRESSION: No acute abnormality.

Motion degraded study

Atrophy with extensive chronic microvascular ischemia.

## 2020-02-13 MED ORDER — SODIUM CHLORIDE 0.9 % IV BOLUS
1000.0000 mL | Freq: Once | INTRAVENOUS | Status: AC
  Administered 2020-02-13: 1000 mL via INTRAVENOUS

## 2020-02-13 MED ORDER — ENOXAPARIN SODIUM 40 MG/0.4ML ~~LOC~~ SOLN
40.0000 mg | SUBCUTANEOUS | Status: DC
  Administered 2020-02-13 – 2020-02-14 (×2): 40 mg via SUBCUTANEOUS
  Filled 2020-02-13 (×2): qty 0.4

## 2020-02-13 MED ORDER — ACETAMINOPHEN 650 MG RE SUPP: 650.0000 mg | Freq: Four times a day (QID) | RECTAL | Status: DC | PRN

## 2020-02-13 MED ORDER — HYDRALAZINE HCL 20 MG/ML IJ SOLN: 10.0000 mg | Freq: Four times a day (QID) | INTRAMUSCULAR | Status: DC | PRN

## 2020-02-13 MED ORDER — ONDANSETRON HCL 4 MG/2ML IJ SOLN: 4.0000 mg | Freq: Four times a day (QID) | INTRAMUSCULAR | Status: DC | PRN

## 2020-02-13 MED ORDER — ACETAMINOPHEN 325 MG PO TABS: 650.0000 mg | ORAL_TABLET | Freq: Four times a day (QID) | ORAL | Status: DC | PRN

## 2020-02-13 MED ORDER — SODIUM CHLORIDE 0.9 % IV SOLN
3.0000 g | Freq: Four times a day (QID) | INTRAVENOUS | Status: DC
  Administered 2020-02-13 – 2020-02-15 (×8): 3 g via INTRAVENOUS
  Filled 2020-02-13 (×4): qty 8
  Filled 2020-02-13: qty 3
  Filled 2020-02-13 (×4): qty 8
  Filled 2020-02-13: qty 3
  Filled 2020-02-13 (×5): qty 8

## 2020-02-13 MED ORDER — WHITE PETROLATUM EX OINT
TOPICAL_OINTMENT | CUTANEOUS | Status: AC
  Administered 2020-02-13: 0.2
  Filled 2020-02-13: qty 28.35

## 2020-02-13 MED ORDER — VANCOMYCIN HCL IN DEXTROSE 1-5 GM/200ML-% IV SOLN
1000.0000 mg | Freq: Once | INTRAVENOUS | Status: AC
  Administered 2020-02-13: 1000 mg via INTRAVENOUS
  Filled 2020-02-13: qty 200

## 2020-02-13 MED ORDER — MAGNESIUM SULFATE 2 GM/50ML IV SOLN
2.0000 g | Freq: Once | INTRAVENOUS | Status: AC
  Administered 2020-02-13: 2 g via INTRAVENOUS
  Filled 2020-02-13: qty 50

## 2020-02-13 MED ORDER — ONDANSETRON HCL 4 MG PO TABS: 4.0000 mg | ORAL_TABLET | Freq: Four times a day (QID) | ORAL | Status: DC | PRN

## 2020-02-13 MED ORDER — ASPIRIN 300 MG RE SUPP
300.0000 mg | Freq: Once | RECTAL | Status: AC
  Administered 2020-02-13: 300 mg via RECTAL
  Filled 2020-02-13: qty 1

## 2020-02-13 MED ORDER — LORAZEPAM 2 MG/ML IJ SOLN
0.5000 mg | Freq: Once | INTRAMUSCULAR | Status: DC
  Filled 2020-02-13: qty 1

## 2020-02-13 MED ORDER — SODIUM CHLORIDE 0.9 % IV SOLN
2.0000 g | Freq: Once | INTRAVENOUS | Status: AC
  Administered 2020-02-13: 2 g via INTRAVENOUS
  Filled 2020-02-13: qty 2

## 2020-02-13 MED ORDER — VANCOMYCIN HCL 750 MG/150ML IV SOLN
750.0000 mg | INTRAVENOUS | Status: DC
  Filled 2020-02-13: qty 150

## 2020-02-13 NOTE — ED Triage Notes (Addendum)
Pt arrives via [facility] from [place], staff states they assisted pt to use restroom around 0130, pt was at baseline, pt seen again at 0730, pt not able to follow commands or speak. Pt will open eyes and look at you but unable to talk. Moves all extremities equally, staff denies any falls/blood thinners. Pt a/ox4 at baseline per facility staff. EMS VS 170/100, HR 70, 94% ra, RR 16, CBG 125, 18g L AC and 18g L FA. One episode of vomiting upon arrival to ED bridge. L pupil approx 28mm, R pupil approx 2, both reactive.

## 2020-02-13 NOTE — Telephone Encounter (Signed)
Patient's daughter, [name], called and said, "I want Dr. Tomi Likens to know that my mom is currently being transported from [facility] to [facility]. They think she may have had a stroke."

## 2020-02-13 NOTE — Progress Notes (Signed)
Pharmacy Antibiotic Note  [name] is a 86 y.o. female admitted on 02/13/2020 with pneumonia.  Pharmacy has been consulted for vancomycin dosing. Pt is afebrile and WBC is WNL. Scr is WNL.   Plan: Vancomycin 1gm IV x 1 then 750mg  IV Q24H F/u renal fxn, C&S, clinical status and trough at [facility] F/u continuation of gram neg coverage     Temp (24hrs), Avg:99.1 F (37.3 C), Min:98.9 F (37.2 C), Max:99.2 F (37.3 C)  Recent Labs  Lab 02/13/20 0941 02/13/20 1011  WBC 8.0  --   CREATININE 0.89 0.80    Estimated Creatinine Clearance: 40 mL/min (by C-G formula based on SCr of 0.8 mg/dL).    Allergies  Allergen Reactions  . Hydrochlorothiazide Other (See Comments)    Dropped sodium level  . Penicillins     Unknown  Did it involve swelling of the face/tongue/throat, SOB, or low BP? Unknown Did it involve sudden or severe rash/hives, skin peeling, or any reaction on the inside of your mouth or nose? Unknown Did you need to seek medical attention at a hospital or doctor's office? Unknown When did it last happen?unknown If all above answers are "NO", may proceed with cephalosporin use.   . Sulfa Antibiotics     unknown    Antimicrobials this admission: Vanc 9/28>> Cefepime x 1 9/28  Dose adjustments this admission: N/A  Microbiology results: Pending  Thank you for allowing pharmacy to be a part of this patient's care.  Sabrea Sankey, Rande Lawman 02/13/2020 11:19 AM

## 2020-02-13 NOTE — ED Notes (Signed)
Off floor to MRI

## 2020-02-13 NOTE — Progress Notes (Signed)
SLP Cancellation Note  Patient Details Name: [name] MRN: [id number] DOB: 05-Aug-1930   Cancelled treatment:       Reason Eval/Treat Not Completed: Patient's level of consciousness. Will f/u tomorrow.    Shahed Yeoman, Katherene Ponto 02/13/2020, 2:55 PM

## 2020-02-13 NOTE — ED Notes (Addendum)
Left pupil slightly larger than right pt either can not understand or is unable to squeeze my fingers

## 2020-02-13 NOTE — Progress Notes (Addendum)
Pharmacy Antibiotic Note  [name] is a 86 y.o. female admitted on 02/13/2020 with pneumonia.  Pharmacy has been consulted for vancomycin dosing. Pt is afebrile and WBC is WNL. Scr is WNL.   Pharmacy consulted to also start Unasyn for aspiration pneumonia. Penicillin allergy noted but unknown reaction - an orthopedic surgery note from 2007 says she had a nosebleed after taking a PCN or sulfa drug as a child. Has tolerated cephalosporins.  Plan: Vancomycin 1gm IV x 1 then 750mg  IV Q24H Unasyn 3g IV every 6 hours F/u renal fxn, C&S, clinical status and trough at [facility] F/u with MRSA pcr to deescalate vancomycin if able      Temp (24hrs), Avg:99.1 F (37.3 C), Min:98.9 F (37.2 C), Max:99.2 F (37.3 C)  Recent Labs  Lab 02/13/20 0941 02/13/20 1011 02/13/20 1259  WBC 8.0  --  9.4  CREATININE 0.89 0.80 0.83  LATICACIDVEN  --   --  1.4    Estimated Creatinine Clearance: 38.5 mL/min (by C-G formula based on SCr of 0.83 mg/dL).    Allergies  Allergen Reactions  . Hydrochlorothiazide Other (See Comments)    Dropped sodium level  . Penicillins     Unknown  Did it involve swelling of the face/tongue/throat, SOB, or low BP? Unknown Did it involve sudden or severe rash/hives, skin peeling, or any reaction on the inside of your mouth or nose? Unknown Did you need to seek medical attention at a hospital or doctor's office? Unknown When did it last happen?unknown If all above answers are "NO", may proceed with cephalosporin use.   . Sulfa Antibiotics     unknown    Antimicrobials this admission: Vanc 9/28>> Cefepime x 1 9/28 Unasyn 9/28 >>  Dose adjustments this admission: N/A  Microbiology results: Pending  Thank you for allowing pharmacy to be a part of this patient's care.  Mercy Riding, PharmD PGY1 Acute Care Pharmacy Resident Please refer to [facility] for unit-specific pharmacist

## 2020-02-13 NOTE — Progress Notes (Signed)
Pt admitted to 3W13. Daughter at bedside. Alert only to self. Aphasic but will say her name at times.  Tele box 11 placed on patient, CCMD called and verified.  Bed alarm on, and call bell within reach.

## 2020-02-13 NOTE — H&P (Addendum)
History and Physical    [name] TJQ:[id number] DOB: Jun 24, 1930 DOA: 02/13/2020  PCP: Javier Glazier, MD  Patient coming from: [place]  I have personally briefly reviewed patient's old medical records in [place]  Chief Complaint: Altered mental status  HPI: [name] is a 86 y.o. female with medical history significant of hypertension, hyperlipidemia, spinal stenosis, PAD status post angioplasty to left SFA on 09/27/2018, dementia, CVA, arthritis presents to emergency department for evaluation of altered mental status.  History gathered from patient's daughter at the bedside.  She tells me that patient has mild dementia however she is alert and oriented x3 at baseline.  She was last seen normal at 1:30 AM and around 730 am staff noted that patient was not able to follow commands, not able to talk therefore EMS was called.  Daughter reports that patient vomited twice in the past 1 week.  She presented with similar symptoms last year in September 2020 however stroke work-up came back negative for acute stroke and thought that patient AMS was likely related to UTI.  Daughter is concerned  that patient might have stroke.  No history of headache, blurry vision, chest pain, fever, chills, seizures, head trauma, shortness of breath, urinary or bowel changes.  No history of smoking, alcohol, illicit drug use.    ED Course: Upon arrival to ED: Patient blood pressure noted to be elevated.  Other vital signs within normal limit.  He is afebrile with no leukocytosis, initial labs such as CBC, PT/INR, APTT, CMP: WNL, ammonia level: WNL, COVID-19 negative.  UA, UC, BC, lactic acid: Pending.  CT head negative for acute findings.  Chest x-ray shows: Increased density in right infrahilar region with concomitant right effusion.  This may represent pneumonia with concomitant effusion aspiration could also have this distribution.  Patient was given IV fluid, Vanco and  cefepime in ED.  [facility] hospitalist consulted for admission for AMS.  Review of Systems: As per HPI otherwise negative.    Past Medical History:  Diagnosis Date  . Adenomatous colon polyp 2007  . Arthritis   . Bilateral carotid bruits 07/27/2018  . CAD (coronary artery disease)   . Chronic back pain   . Gall stone pancreatitis   . Heart murmur   . High cholesterol   . Hx: UTI (urinary tract infection)    Now infrequent  . Hypertension   . Left knee pain Nov. 18, 2014  . Onychomycosis   . Osteoarthritis   . Osteopenia   . PAD (peripheral artery disease) ([place])   . PVD (peripheral vascular disease) ([place])   . Skin ulcer of left heel ([place])   . Trigger finger, right   . Vertigo     Past Surgical History:  Procedure Laterality Date  . ABDOMINAL HYSTERECTOMY     partial  . CHOLECYSTECTOMY  10/30/2011   Procedure: LAPAROSCOPIC CHOLECYSTECTOMY WITH INTRAOPERATIVE CHOLANGIOGRAM;  Surgeon: Zenovia Jarred, MD;  Location: [facility];  Service: General;  Laterality: N/A;  . ENDARTERECTOMY FEMORAL Left 09/27/2018   Procedure: left Endarterectomy Femoral;  Surgeon: Angelia Mould, MD;  Location: [facility];  Service: Vascular;  Laterality: Left;  . EUS  11/11/2011   Procedure: ESOPHAGEAL ENDOSCOPIC ULTRASOUND (EUS) RADIAL;  Surgeon: Arta Silence, MD;  Location: [facility];  Service: Endoscopy;  Laterality: N/A;  possible ERCP  . EYE SURGERY    . FEMORAL-POPLITEAL BYPASS GRAFT Left 09/27/2018   Procedure: left FEMORAL THROMBECTOMY;  Surgeon: Angelia Mould, MD;  Location: [facility];  Service: Vascular;  Laterality: Left;  . JOINT REPLACEMENT Left 06-22-06   Hip  . JOINT REPLACEMENT Right 11-26-06   Shoulder  . JOINT REPLACEMENT Right 02-27-06   Knee  . LOWER EXTREMITY ANGIOGRAPHY N/A 09/27/2018   Procedure: LOWER EXTREMITY ANGIOGRAPHY;  Surgeon: Nigel Mormon, MD;  Location: [facility];  Service: Cardiovascular;  Laterality: N/A;  . PATCH ANGIOPLASTY Left 09/27/2018    Procedure: Patch Angioplasty of left femoral artery using xenosure bovine pericardium patch;  Surgeon: Angelia Mould, MD;  Location: [facility];  Service: Vascular;  Laterality: Left;  . PERIPHERAL VASCULAR INTERVENTION  09/27/2018   Procedure: PERIPHERAL VASCULAR INTERVENTION;  Surgeon: Nigel Mormon, MD;  Location: [facility];  Service: Cardiovascular;;  . TOTAL HIP ARTHROPLASTY    . TOTAL KNEE ARTHROPLASTY    . TOTAL SHOULDER ARTHROPLASTY       reports that she has never smoked. She has never used smokeless tobacco. She reports that she does not drink alcohol and does not use drugs.  Allergies  Allergen Reactions  . Hydrochlorothiazide Other (See Comments)    Dropped sodium level  . Penicillins     Unknown  Did it involve swelling of the face/tongue/throat, SOB, or low BP? Unknown Did it involve sudden or severe rash/hives, skin peeling, or any reaction on the inside of your mouth or nose? Unknown Did you need to seek medical attention at a hospital or doctor's office? Unknown When did it last happen?unknown If all above answers are "NO", may proceed with cephalosporin use.   . Sulfa Antibiotics     unknown    Family History  Problem Relation Age of Onset  . Cancer Mother        ovarian  . Pneumonia Father   . Heart attack Brother   . Heart disease Brother        Heart Disease before age 54  . Healthy Child     Prior to Admission medications   Medication Sig Start Date End Date Taking? Authorizing Provider  acetaminophen (TYLENOL) 325 MG tablet Take 650 mg by mouth every 6 (six) hours as needed. Take with Ultram    [provider]  amLODipine (NORVASC) 10 MG tablet Take 1 tablet (10 mg total) by mouth daily. 05/18/19 02/12/20  Miquel Dunn, NP  aspirin 81 MG tablet Take 81 mg by mouth daily.    [provider]  Carboxymethylcellulose Sodium (THERATEARS) 0.25 % SOLN Apply to eye.    [provider]  cetirizine  (ZYRTEC) 10 MG tablet Take 10 mg by mouth daily.    [provider]  Cholecalciferol 25 MCG (1000 UT) tablet 2,000 Units.     [provider]  ketoconazole (NIZORAL) 2 % shampoo Apply 1 application topically 2 (two) times a week.    [provider]  mineral oil-hydrophilic petrolatum (AQUAPHOR) ointment Apply topically as needed for dry skin.    [provider]  NON FORMULARY Duoderm patch - 1 patch to (L) buttock after washing with soap and water and drying quick    [provider]  NONFORMULARY OR COMPOUNDED ITEM as needed. Baclofen 5%, diclofenac 3%, Gabapentin 6% mixed with 2 gm of Lidocaine    [provider]  pravastatin (PRAVACHOL) 20 MG tablet Take 1 tablet (20 mg total) by mouth daily at 6 PM. 01/21/19   Rinehuls, Early Chars, PA-C  sertraline (ZOLOFT) 50 MG tablet Take 75 mg by mouth daily.  [provider]  traMADol (ULTRAM) 50 MG tablet Take 25 mg by mouth 3 (three) times daily.     [provider]    Physical Exam: Vitals:   02/13/20 0936 02/13/20 1006  BP: (!) 176/57   Pulse: 80   Resp: 16   Temp: 99.2 F (37.3 C) 98.9 F (37.2 C)  TempSrc: Oral Rectal  SpO2: 98%     Constitutional: NAD, calm, comfortable, appears very dehydrated, on room air, sleepy but arousable however goes back to sleep easily.  Not following commands Eyes: PERRL, lids and conjunctivae normal ENMT: Mucous membranes are moist. Posterior pharynx clear of any exudate or lesions.Normal dentition.  Neck: normal, supple, no masses, no thyromegaly Respiratory: clear to auscultation bilaterally, no wheezing, no crackles. Normal respiratory effort. No accessory muscle use.  Cardiovascular: Regular rate and rhythm, systolic murmur noted/ rubs / gallops. No extremity edema. 2+ pedal pulses. No carotid bruits.  Abdomen: no tenderness, no masses palpated. No hepatosplenomegaly. Bowel sounds positive.  Musculoskeletal: no clubbing / cyanosis. No  joint deformity upper and lower extremities. Good ROM, no contractures. Normal muscle tone.  Skin: no rashes, lesions, ulcers. No induration Neurologic: Sleepy but arousable however goes back to sleep easily.  Not following commands.  Unable to perform neurological exam as patient is drowsy.   Labs on Admission: I have personally reviewed following labs and imaging studies  CBC: Recent Labs  Lab 02/13/20 0941 02/13/20 1011 02/13/20 1037  WBC 8.0  --   --   NEUTROABS 5.1  --   --   HGB 12.6 13.3 12.2  HCT 39.5 39.0 36.0  MCV 99.0  --   --   PLT 272  --   --    Basic Metabolic Panel: Recent Labs  Lab 02/13/20 0941 02/13/20 1011 02/13/20 1037  NA 140 141 140  K 3.5 3.5 3.4*  CL 104 100  --   CO2 30  --   --   GLUCOSE 124* 118*  --   BUN 14 14  --   CREATININE 0.89 0.80  --   CALCIUM 9.0  --   --    GFR: Estimated Creatinine Clearance: 40 mL/min (by C-G formula based on SCr of 0.8 mg/dL). Liver Function Tests: Recent Labs  Lab 02/13/20 0941  AST 16  ALT 11  ALKPHOS 89  BILITOT 0.8  PROT 5.8*  ALBUMIN 3.4*   No results for input(s): LIPASE, AMYLASE in the last 168 hours. Recent Labs  Lab 02/13/20 1008  AMMONIA 19   Coagulation Profile: Recent Labs  Lab 02/13/20 0941  INR 1.0   Cardiac Enzymes: No results for input(s): CKTOTAL, CKMB, CKMBINDEX, TROPONINI in the last 168 hours. BNP (last 3 results) No results for input(s): PROBNP in the last 8760 hours. HbA1C: No results for input(s): HGBA1C in the last 72 hours. CBG: Recent Labs  Lab 02/13/20 1012  GLUCAP 119*   Lipid Profile: No results for input(s): CHOL, HDL, LDLCALC, TRIG, CHOLHDL, LDLDIRECT in the last 72 hours. Thyroid Function Tests: No results for input(s): TSH, T4TOTAL, FREET4, T3FREE, THYROIDAB in the last 72 hours. Anemia Panel: No results for input(s): VITAMINB12, FOLATE, FERRITIN, TIBC, IRON, RETICCTPCT in the last 72 hours. Urine analysis:    Component Value Date/Time    COLORURINE AMBER (A) 01/19/2019 0156   APPEARANCEUR TURBID (A) 01/19/2019 0156   LABSPEC 1.024 01/19/2019 0156   PHURINE 7.0 01/19/2019 0156   GLUCOSEU NEGATIVE 01/19/2019 0156   HGBUR MODERATE (A) 01/19/2019 0156  BILIRUBINUR NEGATIVE 01/19/2019 0156   KETONESUR 5 (A) 01/19/2019 0156   PROTEINUR 30 (A) 01/19/2019 0156   UROBILINOGEN 0.2 10/27/2011 2204   NITRITE NEGATIVE 01/19/2019 0156   LEUKOCYTESUR LARGE (A) 01/19/2019 0156    Radiological Exams on Admission: CT HEAD WO CONTRAST  Result Date: 02/13/2020 CLINICAL DATA:  Delirium. EXAM: CT HEAD WITHOUT CONTRAST TECHNIQUE: Contiguous axial images were obtained from the base of the skull through the vertex without intravenous contrast. COMPARISON:  CT head 05/29/2019 FINDINGS: Brain: Motion degraded study Generalized atrophy. Extensive chronic microvascular ischemic change in the white matter. Chronic infarct left thalamus unchanged. Negative for acute infarct, hemorrhage, mass. Vascular: Negative for hyperdense vessel Skull: Negative Sinuses/Orbits: Air-fluid level right sphenoid sinus. Remaining paranasal sinuses clear. Bilateral cataract extraction Other: None IMPRESSION: No acute abnormality. Motion degraded study Atrophy with extensive chronic microvascular ischemia. Electronically Signed   By: Franchot Gallo M.D.   On: 02/13/2020 11:51   DG Chest Portable 1 View  Result Date: 02/13/2020 CLINICAL DATA:  Altered mental status EXAM: PORTABLE CHEST 1 VIEW COMPARISON:  12/25/2017 FINDINGS: Calcified atheromatous plaque of the thoracic aorta. Similar to prior study. Cardiomediastinal contours and hilar structures with similar appearance though increased density in the RIGHT infrahilar region partially obscured RIGHT hemidiaphragm. Graded opacity in this area suggests concomitant effusion. On limited assessment skeletal structures without acute process. Severe LEFT glenohumeral degenerative changes and signs of RIGHT shoulder arthroplasty  incompletely assessed. IMPRESSION: Increasing density in the RIGHT infrahilar region with concomitant RIGHT effusion. This may represent pneumonia with concomitant effusion aspiration could also have this distribution, correlate with any risk factors for aspiration. Suggest follow-up to ensure resolution and exclude underlying lesion in this location. Electronically Signed   By: Zetta Bills M.D.   On: 02/13/2020 10:34    EKG: Independently reviewed.  Sinus rhythm.  No ST elevation or depression noted.  Assessment/Plan Principal Problem:   AMS (altered mental status) Active Problems:   Hypertension   Hypercholesterolemia    Altered mental status: -Could be secondary to aspiration pneumonia versus UTI versus stroke.  Patient's afebrile with no leukocytosis.  Maintaining oxygen saturation on room air.  CT head negative for acute findings.  Reviewed chest x-ray.  Patient received IV fluid, Vanco and cefepime in ED. COVID-19 is negative. -UA, UC, BC, lactic acid: Pending.  Ammonia level: WNL. -Admit patient on the floor.  On telemetry.  On continuous pulse ox. -Continue IV fluids, start patient on Unasyn.  Will get MRI brain. -Check TSH, magnesium -We will keep her n.p.o. until she passes bedside swallow evaluation. -Consult PT/OT/SLP -Frequent neuro checks -On fall/aspiration/seizure precautions  Hypertension: Blood pressure elevated -Hold home amlodipine, valsartan for now as patient is NPO.  Hydralazine as needed for blood pressure more than 160/100.  Hyperlipidemia: Hold statin for now.  PAD: status post angioplasty to left SFA on 09/27/2018 -Hold aspirin, statin for now.  Depression: Hold Zoloft.  GERD: Hold Pepcid  Hypomagnesemia: Replenished.  Repeat magnesium level tomorrow AM.  Please note: MRI came back positive for acute involving left corona radiata.  I called neurologist Dr. Theda Sers and discussed about MRI findings.  He will come and evaluate the patient.  Aspirin 300  mg PR given to the patient.  I discussed diagnosis and plan with patient daughters at bedside and she verbalized understanding.  DVT prophylaxis: Lovenox/SCD Code Status: DNR-confirmed with the patient Family Communication: Patient's daughter Jocelyn Lamer who is power of attorney present at bedside.  Plan of care discussed with patient's daughter  in length and she verbalized understanding and agreed with it. Disposition Plan: SNF in 2 to 3 days  consults called: None Admission status: Inpatient   Mckinley Jewel MD [facility]  If 7PM-7AM, please contact night-coverage [email or web address] Password [facility]  02/13/2020, 12:43 PM

## 2020-02-13 NOTE — Progress Notes (Signed)
PT Cancellation Note  Patient Details Name: [name] MRN: [id number] DOB: 1930/08/07   Cancelled Treatment:    Reason Eval/Treat Not Completed: Medical issues which prohibited therapy;Patient not medically ready per RN patient remains unresponsive and cannot follow commands. Holding PT for now, will follow up on next date of service. If patient has urgent rehab needs, please direct message this therapist in [place] (8am-4pm) or page at number below.     Windell Norfolk, DPT, PN1   Supplemental Physical Therapist [facility]    Pager [phone] Acute Rehab Office [phone]

## 2020-02-13 NOTE — ED Provider Notes (Signed)
[facility] EMERGENCY DEPARTMENT Provider Note   CSN: [id number] Arrival date & time: 02/13/20  2637  LEVEL 5 CAVEAT - ALTERED MENTAL STATUS  History Chief Complaint  Patient presents with  . Altered Mental Status    [name] is a 86 y.o. female.  HPI 86 year old female presents with altered mental status.  History is primarily from the daughter at the bedside as well as nurse report from EMS.  The patient apparently has been "off" for the past couple days.  However she was last seen normal around 1:30 AM and now is altered upon check by the staff at her facility at around 730.  Patient has been lethargic, not responding, and does not grip hands.  The patient does have dementia but this is worse than typical per the daughter.  Similar to when she had a strokelike episode in September 2020 that received IV TPA.  Chart review indicates that they think it probably was not a stroke given negative work-up but probably more urinary tract infection.   Past Medical History:  Diagnosis Date  . Adenomatous colon polyp 2007  . Arthritis   . Bilateral carotid bruits 07/27/2018  . CAD (coronary artery disease)   . Chronic back pain   . Gall stone pancreatitis   . Heart murmur   . High cholesterol   . Hx: UTI (urinary tract infection)    Now infrequent  . Hypertension   . Left knee pain Nov. 18, 2014  . Onychomycosis   . Osteoarthritis   . Osteopenia   . PAD (peripheral artery disease) ([place])   . PVD (peripheral vascular disease) ([place])   . Skin ulcer of left heel ([place])   . Trigger finger, right   . Vertigo     Patient Active Problem List   Diagnosis Date Noted  . AMS (altered mental status) 02/13/2020  . Stroke (cerebrum) ([place]) 01/17/2019  . PAD (peripheral artery disease) ([place]) 09/27/2018  . Bilateral carotid bruits 07/27/2018  . Pain in limb-Left Knee 04/04/2013  . Peripheral vascular disease, unspecified ([place]) 04/04/2013  . Choledocholithiasis with  obstruction 10/30/2011  . PVD (peripheral vascular disease) ([place]) 10/29/2011  . Pancreatitis 10/28/2011  . Cholelithiasis 10/28/2011  . UTI (urinary tract infection) 10/28/2011  . Hypertension 10/28/2011  . Hypercholesterolemia 10/28/2011    Past Surgical History:  Procedure Laterality Date  . ABDOMINAL HYSTERECTOMY     partial  . CHOLECYSTECTOMY  10/30/2011   Procedure: LAPAROSCOPIC CHOLECYSTECTOMY WITH INTRAOPERATIVE CHOLANGIOGRAM;  Surgeon: Zenovia Jarred, MD;  Location: [facility];  Service: General;  Laterality: N/A;  . ENDARTERECTOMY FEMORAL Left 09/27/2018   Procedure: left Endarterectomy Femoral;  Surgeon: Angelia Mould, MD;  Location: [facility];  Service: Vascular;  Laterality: Left;  . EUS  11/11/2011   Procedure: ESOPHAGEAL ENDOSCOPIC ULTRASOUND (EUS) RADIAL;  Surgeon: Arta Silence, MD;  Location: [facility];  Service: Endoscopy;  Laterality: N/A;  possible ERCP  . EYE SURGERY    . FEMORAL-POPLITEAL BYPASS GRAFT Left 09/27/2018   Procedure: left FEMORAL THROMBECTOMY;  Surgeon: Angelia Mould, MD;  Location: [facility];  Service: Vascular;  Laterality: Left;  . JOINT REPLACEMENT Left 06-22-06   Hip  . JOINT REPLACEMENT Right 11-26-06   Shoulder  . JOINT REPLACEMENT Right 02-27-06   Knee  . LOWER EXTREMITY ANGIOGRAPHY N/A 09/27/2018   Procedure: LOWER EXTREMITY ANGIOGRAPHY;  Surgeon: Nigel Mormon, MD;  Location: [facility];  Service: Cardiovascular;  Laterality: N/A;  . PATCH ANGIOPLASTY Left  09/27/2018   Procedure: Patch Angioplasty of left femoral artery using xenosure bovine pericardium patch;  Surgeon: Angelia Mould, MD;  Location: [facility];  Service: Vascular;  Laterality: Left;  . PERIPHERAL VASCULAR INTERVENTION  09/27/2018   Procedure: PERIPHERAL VASCULAR INTERVENTION;  Surgeon: Nigel Mormon, MD;  Location: [facility];  Service: Cardiovascular;;  . TOTAL HIP ARTHROPLASTY    . TOTAL KNEE ARTHROPLASTY    . TOTAL SHOULDER  ARTHROPLASTY       OB History   No obstetric history on file.     Family History  Problem Relation Age of Onset  . Cancer Mother        ovarian  . Pneumonia Father   . Heart attack Brother   . Heart disease Brother        Heart Disease before age 24  . Healthy Child     Social History   Tobacco Use  . Smoking status: Never Smoker  . Smokeless tobacco: Never Used  Vaping Use  . Vaping Use: Never used  Substance Use Topics  . Alcohol use: No    Alcohol/week: 0.0 standard drinks  . Drug use: No    Home Medications Prior to Admission medications   Medication Sig Start Date End Date Taking? Authorizing Provider  acetaminophen (TYLENOL) 325 MG tablet Take 650 mg by mouth in the morning, at noon, and at bedtime. Take with Ultram    Yes [provider]  amLODipine (NORVASC) 10 MG tablet Take 1 tablet (10 mg total) by mouth daily. 05/18/19 02/13/20 Yes Miquel Dunn, NP  aspirin 81 MG tablet Take 81 mg by mouth daily.   Yes [provider]  Carboxymethylcellulose Sodium (THERATEARS) 0.25 % SOLN Apply 1 drop to eye in the morning and at bedtime. Apply 1 drop in each eye twice daily for dry eyes.   Yes [provider]  cetirizine (ZYRTEC) 10 MG tablet Take 10 mg by mouth daily.   Yes [provider]  Cholecalciferol 50 MCG (2000 UT) TABS Take 2,000 Units by mouth daily.    Yes [provider]  famotidine (PEPCID) 20 MG tablet Take 20 mg by mouth daily.   Yes [provider]  ondansetron (ZOFRAN) 4 MG tablet Take 4 mg by mouth 2 (two) times daily as needed for nausea or vomiting.   Yes [provider]  pravastatin (PRAVACHOL) 20 MG tablet Take 1 tablet (20 mg total) by mouth daily at 6 PM. 01/21/19  Yes Rinehuls, Early Chars, PA-C  sertraline (ZOLOFT) 50 MG tablet Take 75 mg by mouth daily. Give 1 1/2 tablets to equal 75 mg by mouth every morning   Yes [provider]  traMADol (ULTRAM) 50 MG tablet Take 25 mg  by mouth 3 (three) times daily. Give 1/2 tablet by mouth 3 times daily for pain.   Yes [provider]  valsartan (DIOVAN) 40 MG tablet Take 40 mg by mouth daily.   Yes [provider]    Allergies    Hydrochlorothiazide, Penicillins, and Sulfa antibiotics  Review of Systems   Review of Systems  Unable to perform ROS: Mental status change    Physical Exam Updated Vital Signs BP (!) 164/59   Pulse 81   Temp 98.9 F (37.2 C) (Rectal)   Resp 19   SpO2 95%   Physical Exam Vitals and nursing note reviewed.  Constitutional:      Appearance: She is well-developed.  HENT:     Head:  Normocephalic and atraumatic.     Right Ear: External ear normal.     Left Ear: External ear normal.     Nose: Nose normal.     Mouth/Throat:     Mouth: Mucous membranes are dry.  Eyes:     General:        Right eye: No discharge.        Left eye: No discharge.  Cardiovascular:     Rate and Rhythm: Normal rate and regular rhythm.     Heart sounds: Normal heart sounds.  Pulmonary:     Effort: Pulmonary effort is normal.     Breath sounds: Normal breath sounds.  Abdominal:     General: There is no distension.     Palpations: Abdomen is soft.     Tenderness: There is no abdominal tenderness.  Skin:    General: Skin is warm and dry.  Neurological:     Mental Status: She is alert.     Comments: Patient awakens to light stimulation. She somewhat follows commands. Weakly squeezes both hands, barely can lift legs off stretcher. Does not respond when asked who the person visiting is (her daughter)  Psychiatric:        Mood and Affect: Mood is not anxious.     ED Results / Procedures / Treatments   Labs (all labs ordered are listed, but only abnormal results are displayed) Labs Reviewed  COMPREHENSIVE METABOLIC PANEL - Abnormal; Notable for the following components:      Result Value   Glucose, Bld 124 (*)    Total Protein 5.8 (*)    Albumin 3.4 (*)    GFR calc non Af Amer  58 (*)    All other components within normal limits  URINALYSIS, ROUTINE W REFLEX MICROSCOPIC - Abnormal; Notable for the following components:   Hgb urine dipstick SMALL (*)    Ketones, ur 5 (*)    Bacteria, UA RARE (*)    Non Squamous Epithelial 0-5 (*)    All other components within normal limits  CBC - Abnormal; Notable for the following components:   RBC 3.81 (*)    MCV 102.1 (*)    All other components within normal limits  MAGNESIUM - Abnormal; Notable for the following components:   Magnesium 1.6 (*)    All other components within normal limits  I-STAT CHEM 8, ED - Abnormal; Notable for the following components:   Glucose, Bld 118 (*)    All other components within normal limits  CBG MONITORING, ED - Abnormal; Notable for the following components:   Glucose-Capillary 119 (*)    All other components within normal limits  I-STAT ARTERIAL BLOOD GAS, ED - Abnormal; Notable for the following components:   pO2, Arterial 77 (*)    Bicarbonate 29.4 (*)    Acid-Base Excess 4.0 (*)    Potassium 3.4 (*)    All other components within normal limits  RESPIRATORY PANEL BY RT PCR (FLU A&B, COVID)  URINE CULTURE  CULTURE, BLOOD (ROUTINE X 2)  CULTURE, BLOOD (ROUTINE X 2)  MRSA PCR SCREENING  PROTIME-INR  APTT  CBC  DIFFERENTIAL  AMMONIA  LACTIC ACID, PLASMA  CREATININE, SERUM  PHOSPHORUS  TSH    EKG EKG Interpretation  Date/Time:  Tuesday February 13 2020 10:16:13 EDT Ventricular Rate:  74 PR Interval:  174 QRS Duration: 90 QT Interval:  408 QTC Calculation: 453 R Axis:   32 Text Interpretation: Sinus rhythm no acute ST/T changes overall similar to  Sept 2020 Confirmed by Sherwood Gambler [phone]) on 02/13/2020 10:18:56 AM   Radiology CT HEAD WO CONTRAST  Result Date: 02/13/2020 CLINICAL DATA:  Delirium. EXAM: CT HEAD WITHOUT CONTRAST TECHNIQUE: Contiguous axial images were obtained from the base of the skull through the vertex without intravenous contrast. COMPARISON:   CT head 05/29/2019 FINDINGS: Brain: Motion degraded study Generalized atrophy. Extensive chronic microvascular ischemic change in the white matter. Chronic infarct left thalamus unchanged. Negative for acute infarct, hemorrhage, mass. Vascular: Negative for hyperdense vessel Skull: Negative Sinuses/Orbits: Air-fluid level right sphenoid sinus. Remaining paranasal sinuses clear. Bilateral cataract extraction Other: None IMPRESSION: No acute abnormality. Motion degraded study Atrophy with extensive chronic microvascular ischemia. Electronically Signed   By: Franchot Gallo M.D.   On: 02/13/2020 11:51   DG Chest Portable 1 View  Result Date: 02/13/2020 CLINICAL DATA:  Altered mental status EXAM: PORTABLE CHEST 1 VIEW COMPARISON:  12/25/2017 FINDINGS: Calcified atheromatous plaque of the thoracic aorta. Similar to prior study. Cardiomediastinal contours and hilar structures with similar appearance though increased density in the RIGHT infrahilar region partially obscured RIGHT hemidiaphragm. Graded opacity in this area suggests concomitant effusion. On limited assessment skeletal structures without acute process. Severe LEFT glenohumeral degenerative changes and signs of RIGHT shoulder arthroplasty incompletely assessed. IMPRESSION: Increasing density in the RIGHT infrahilar region with concomitant RIGHT effusion. This may represent pneumonia with concomitant effusion aspiration could also have this distribution, correlate with any risk factors for aspiration. Suggest follow-up to ensure resolution and exclude underlying lesion in this location. Electronically Signed   By: Zetta Bills M.D.   On: 02/13/2020 10:34    Procedures Procedures (including critical care time)  Medications Ordered in ED Medications  vancomycin (VANCOREADY) IVPB 750 mg/150 mL (has no administration in time range)  enoxaparin (LOVENOX) injection 40 mg (has no administration in time range)  acetaminophen (TYLENOL) tablet 650 mg (has  no administration in time range)    Or  acetaminophen (TYLENOL) suppository 650 mg (has no administration in time range)  ondansetron (ZOFRAN) tablet 4 mg (has no administration in time range)    Or  ondansetron (ZOFRAN) injection 4 mg (has no administration in time range)  hydrALAZINE (APRESOLINE) injection 10 mg (has no administration in time range)  LORazepam (ATIVAN) injection 0.5 mg (has no administration in time range)  magnesium sulfate IVPB 2 g 50 mL (has no administration in time range)  Ampicillin-Sulbactam (UNASYN) 3 g in sodium chloride 0.9 % 100 mL IVPB (has no administration in time range)  sodium chloride 0.9 % bolus 1,000 mL (0 mLs Intravenous Stopped 02/13/20 1307)  ceFEPIme (MAXIPIME) 2 g in sodium chloride 0.9 % 100 mL IVPB (0 g Intravenous Stopped 02/13/20 1255)  vancomycin (VANCOCIN) IVPB 1000 mg/200 mL premix (0 mg Intravenous Stopped 02/13/20 1255)    ED Course  I have reviewed the triage vital signs and the nursing notes.  Pertinent labs & imaging results that were available during my care of the patient were reviewed by me and considered in my medical decision making (see chart for details).  Clinical Course as of Feb 12 1537  Tue Feb 13, 2020  1006 By report, last known well was 1:30 AM.  Not a TPA candidate but I am also not convinced this is definitively a stroke.  Also sounds like she has been "off" for the last couple days.  Will start work-up including for stroke but also for sepsis   [SG]    Clinical Course User Index [SG] Sherwood Gambler, MD  MDM Rules/Calculators/A&P                          Patient's lab work is overall unremarkable.  CT head has been reviewed and shows no acute obvious ischemia or bleeding.  Chest x-ray does show pneumonia which is likely contributing to her encephalopathy.  She is protecting her airway and is not hypoxic but given the change in mental status, I think she will need admission and supportive care.  Hospitalist to  admit. Final Clinical Impression(s) / ED Diagnoses Final diagnoses:  Delirium  Pneumonia of right lower lobe due to infectious organism    Rx / DC Orders ED Discharge Orders    None       Sherwood Gambler, MD 02/13/20 1538

## 2020-02-13 NOTE — ED Notes (Signed)
Dr Ashok Cordia in triage to assess patient.

## 2020-02-14 ENCOUNTER — Inpatient Hospital Stay (HOSPITAL_COMMUNITY): Payer: Medicare Other

## 2020-02-14 DIAGNOSIS — I639 Cerebral infarction, unspecified: Secondary | ICD-10-CM

## 2020-02-14 DIAGNOSIS — J690 Pneumonitis due to inhalation of food and vomit: Principal | ICD-10-CM

## 2020-02-14 LAB — HEMOGLOBIN A1C
Hgb A1c MFr Bld: 5 % (ref 4.8–5.6)
Mean Plasma Glucose: 96.8 mg/dL

## 2020-02-14 LAB — COMPREHENSIVE METABOLIC PANEL
ALT: 10 U/L (ref 0–44)
AST: 15 U/L (ref 15–41)
Albumin: 3 g/dL — ABNORMAL LOW (ref 3.5–5.0)
Alkaline Phosphatase: 80 U/L (ref 38–126)
Anion gap: 9 (ref 5–15)
BUN: 6 mg/dL — ABNORMAL LOW (ref 8–23)
CO2: 28 mmol/L (ref 22–32)
Calcium: 8.6 mg/dL — ABNORMAL LOW (ref 8.9–10.3)
Chloride: 103 mmol/L (ref 98–111)
Creatinine, Ser: 0.75 mg/dL (ref 0.44–1.00)
GFR calc Af Amer: >60 mL/min (ref >60)
GFR calc non Af Amer: >60 mL/min (ref >60)
Glucose, Bld: 92 mg/dL (ref 70–99)
Potassium: 3 mmol/L — ABNORMAL LOW (ref 3.5–5.1)
Sodium: 140 mmol/L (ref 135–145)
Total Bilirubin: 1 mg/dL (ref 0.3–1.2)
Total Protein: 5.4 g/dL — ABNORMAL LOW (ref 6.5–8.1)

## 2020-02-14 LAB — URINE CULTURE

## 2020-02-14 LAB — CBC
HCT: 37.5 % (ref 36.0–46.0)
Hemoglobin: 12.2 g/dL (ref 12.0–15.0)
MCH: 32 pg (ref 26.0–34.0)
MCHC: 32.5 g/dL (ref 30.0–36.0)
MCV: 98.4 fL (ref 80.0–100.0)
Platelets: 256 10*3/uL (ref 150–400)
RBC: 3.81 MIL/uL — ABNORMAL LOW (ref 3.87–5.11)
RDW: 11.9 % (ref 11.5–15.5)
WBC: 7.8 10*3/uL (ref 4.0–10.5)
nRBC: 0 % (ref 0.0–0.2)

## 2020-02-14 LAB — MRSA PCR SCREENING: MRSA by PCR: NEGATIVE

## 2020-02-14 LAB — MAGNESIUM: Magnesium: 1.9 mg/dL (ref 1.7–2.4)

## 2020-02-14 MED ORDER — RESOURCE THICKENUP CLEAR PO POWD
ORAL | Status: DC | PRN
  Filled 2020-02-14 (×2): qty 125

## 2020-02-14 MED ORDER — ASPIRIN EC 81 MG PO TBEC
81.0000 mg | DELAYED_RELEASE_TABLET | Freq: Every day | ORAL | Status: DC
  Administered 2020-02-14 – 2020-02-15 (×2): 81 mg via ORAL
  Filled 2020-02-14 (×2): qty 1

## 2020-02-14 MED ORDER — SERTRALINE HCL 50 MG PO TABS
75.0000 mg | ORAL_TABLET | Freq: Every day | ORAL | Status: DC
  Administered 2020-02-14 – 2020-02-15 (×2): 75 mg via ORAL
  Filled 2020-02-14 (×2): qty 2

## 2020-02-14 MED ORDER — POTASSIUM CHLORIDE CRYS ER 20 MEQ PO TBCR
40.0000 meq | EXTENDED_RELEASE_TABLET | Freq: Once | ORAL | Status: AC
  Administered 2020-02-14: 40 meq via ORAL
  Filled 2020-02-14: qty 2

## 2020-02-14 MED ORDER — IRBESARTAN 150 MG PO TABS
75.0000 mg | ORAL_TABLET | Freq: Every day | ORAL | Status: DC
  Administered 2020-02-14 – 2020-02-15 (×2): 75 mg via ORAL
  Filled 2020-02-14 (×2): qty 1

## 2020-02-14 MED ORDER — AMLODIPINE BESYLATE 10 MG PO TABS
10.0000 mg | ORAL_TABLET | Freq: Every day | ORAL | Status: DC
  Administered 2020-02-14 – 2020-02-15 (×2): 10 mg via ORAL
  Filled 2020-02-14 (×2): qty 1

## 2020-02-14 MED ORDER — PRAVASTATIN SODIUM 10 MG PO TABS
20.0000 mg | ORAL_TABLET | Freq: Every day | ORAL | Status: DC
  Administered 2020-02-14: 20 mg via ORAL
  Filled 2020-02-14: qty 2

## 2020-02-14 MED ORDER — STROKE: EARLY STAGES OF RECOVERY BOOK
Freq: Once | Status: AC
Start: 2020-02-14 — End: 2020-02-14
  Filled 2020-02-14: qty 1

## 2020-02-14 MED ORDER — POLYETHYLENE GLYCOL 3350 17 G PO PACK
17.0000 g | PACK | Freq: Every day | ORAL | Status: DC
  Administered 2020-02-14 – 2020-02-15 (×2): 17 g via ORAL
  Filled 2020-02-14 (×2): qty 1

## 2020-02-14 MED ORDER — CHLORHEXIDINE GLUCONATE CLOTH 2 % EX PADS
6.0000 | MEDICATED_PAD | Freq: Every day | CUTANEOUS | Status: DC
  Administered 2020-02-14: 6 via TOPICAL

## 2020-02-14 MED ORDER — SENNOSIDES-DOCUSATE SODIUM 8.6-50 MG PO TABS: 1.0000 | ORAL_TABLET | Freq: Two times a day (BID) | ORAL | Status: DC | PRN

## 2020-02-14 NOTE — Telephone Encounter (Signed)
Patient's daughter called back to make sure Dr. Tomi Likens is aware the patient is in the hospital. She stated she is on the third floor in room 13.

## 2020-02-14 NOTE — Evaluation (Signed)
Physical Therapy Evaluation Patient Details Name: [name] MRN: [id number] DOB: 04/19/1931 Today's Date: 02/14/2020   History of Present Illness  pt is 86 y.o. female with medical history significant of hypertension, hyperlipidemia, spinal stenosis, PAD status post angioplasty to left SFA on 09/27/2018, dementia, CVA, arthritis presents to emergency department for evaluation of altered mental status. CT head negative for acute findings, Chest x-ray shows: Increased density in right infrahilar region with concomitant right effusion.? pna.  MRI brain showed lacunar infarct.  Clinical Impression  Patient presents with decreased mobility due to decreased strength, decreased balance, decreased activity tolerance with HR up to 120's with ambulation, decreased safety awareness, fear of falling.  She was at [facility] prior to admission, able to ambulate at least to bathroom with staff, today mod A for ambulation to bathroom with significant fatigue and elevated HR, diaphoresis noted.  Patient also with some difficulty communicating noted with aphasia and some dysarthria.  Recommend Speech Language eval as well.  PT to continue to follow acutely, recommend follow up therapy back at [facility].     Follow Up Recommendations SNF    Equipment Recommendations  None recommended by PT    Recommendations for Other Services       Precautions / Restrictions Precautions Precautions: Fall Precaution Comments: incontinent, watch HR      Mobility  Bed Mobility Overal bed mobility: Needs Assistance Bed Mobility: Supine to Sit;Sit to Supine     Supine to sit: Mod assist;HOB elevated Sit to supine: Max assist   General bed mobility comments: increased time to initiate moving legs off bed and lifting trunk, but pt participated; to supine assist to lift legs, lower trunk after pt initiated  Transfers Overall transfer level: Needs assistance Equipment used: Rolling walker (2 wheeled) Transfers:  Sit to/from Stand Sit to Stand: Mod assist         General transfer comment: lifting help to stand from EOB and from toilet in bathroom, cues to use grabbar, push from EOB  Ambulation/Gait Ambulation/Gait assistance: Mod assist Gait Distance (Feet): 12 Feet (x 2) Assistive device: Rolling walker (2 wheeled) Gait Pattern/deviations: Step-to pattern;Decreased stride length;Shuffle;Trunk flexed     General Gait Details: cues and assist to extend trunk, but daughter reports it is chronic; assist for safety with balance/ walker proximity and due to fatigue and incontinence along the way  Stairs            Wheelchair Mobility    Modified Rankin (Stroke Patients Only) Modified Rankin (Stroke Patients Only) Pre-Morbid Rankin Score: Moderately severe disability Modified Rankin: Moderately severe disability     Balance Overall balance assessment: Needs assistance Sitting-balance support: Feet supported Sitting balance-Leahy Scale: Poor Sitting balance - Comments: close S at least for sitting balance at EOB; held rail in bathroom seated on toilet Postural control: Posterior lean Standing balance support: Bilateral upper extremity supported Standing balance-Leahy Scale: Poor                               Pertinent Vitals/Pain Pain Assessment: Faces Faces Pain Scale: Hurts little more Pain Location: generalized with mobility; daughter relates significant limitations due to arthritis Pain Descriptors / Indicators: Discomfort;Aching Pain Intervention(s): Monitored during session;Limited activity within patient's tolerance    Home Living Family/patient expects to be discharged to:: Skilled nursing facility                      Prior  Function Level of Independence: Needs assistance   Gait / Transfers Assistance Needed: use of RW with staff assist for mobility; pt prefers w/c.  ADL's / Homemaking Assistance Needed: A for all ADL's aside from self feeding  and grooming seated,         Hand Dominance        Extremity/Trunk Assessment   Upper Extremity Assessment Upper Extremity Assessment: LUE deficits/detail LUE Deficits / Details: daughter reports painful and limited as she fell on it recently, getting OT at facility    Lower Extremity Assessment Lower Extremity Assessment: Generalized weakness    Cervical / Trunk Assessment Cervical / Trunk Assessment: Kyphotic;Other exceptions Cervical / Trunk Exceptions: scoliotic  Communication   Communication: Expressive difficulties (some dysarthria and aphasia noted)  Cognition Arousal/Alertness: Awake/alert Behavior During Therapy: Anxious Overall Cognitive Status: Impaired/Different from baseline Area of Impairment: Following commands;Attention;Problem solving;Memory                   Current Attention Level: Focused Memory: Decreased short-term memory Following Commands: Follows one step commands with increased time;Follows one step commands consistently     Problem Solving: Slow processing;Decreased initiation;Requires verbal cues;Requires tactile cues;Difficulty sequencing        General Comments General comments (skin integrity, edema, etc.): daughter present throughout; pt incontinent of urine and stool on her way to bathroom, daughter reports had not had bowel movement in few days, noted during hygiene stool in rectum, pt not expelling, RN aware.  Pt diaphoretic, pale and shaky on EOB noted BP once supine WNL, HR max 122, down to 90's in supine, RN informed    Exercises     Assessment/Plan    PT Assessment Patient needs continued PT services  PT Problem List Decreased strength;Decreased mobility;Decreased activity tolerance;Decreased knowledge of use of DME;Cardiopulmonary status limiting activity;Decreased safety awareness;Decreased balance;Decreased cognition       PT Treatment Interventions DME instruction;Therapeutic activities;Patient/family  education;Therapeutic exercise;Gait training;Balance training;Functional mobility training    PT Goals (Current goals can be found in the Care Plan section)  Acute Rehab PT Goals Patient Stated Goal: to return to [facility] for skilled rehab PT Goal Formulation: With patient/family Time For Goal Achievement: 02/28/20 Potential to Achieve Goals: Fair    Frequency Min 3X/week   Barriers to discharge        Co-evaluation               AM-PAC PT "6 Clicks" Mobility  Outcome Measure Help needed turning from your back to your side while in a flat bed without using bedrails?: A Lot Help needed moving from lying on your back to sitting on the side of a flat bed without using bedrails?: A Lot Help needed moving to and from a bed to a chair (including a wheelchair)?: A Lot Help needed standing up from a chair using your arms (e.g., wheelchair or bedside chair)?: A Lot Help needed to walk in hospital room?: A Lot Help needed climbing 3-5 steps with a railing? : Total 6 Click Score: 11    End of Session Equipment Utilized During Treatment: Gait belt Activity Tolerance: Treatment limited secondary to medical complications (Comment);Patient limited by fatigue Patient left: in bed;with call bell/phone within reach;with family/visitor present Nurse Communication: Mobility status;Other (comment) (weak, diaphoretic, pale after ambulation; question constipation) PT Visit Diagnosis: Other abnormalities of gait and mobility (R26.89);Other symptoms and signs involving the nervous system (N36.144)    Time: 3154-0086 PT Time Calculation (min) (ACUTE ONLY): 39 min   Charges:  PT Evaluation $PT Eval Moderate Complexity: 1 Mod PT Treatments $Therapeutic Activity: 23-37 mins        Magda Kiel, PT Acute Rehabilitation Services WGYFE:[phone] Office:[phone] 02/14/2020   Reginia Naas 02/14/2020, 7:43 PM

## 2020-02-14 NOTE — Evaluation (Signed)
Clinical/Bedside Swallow Evaluation Patient Details  Name: [name] MRN: [id number] Date of Birth: [birth year]  Today's Date: 02/14/2020 Time: SLP Start Time (ACUTE ONLY): 0932 SLP Stop Time (ACUTE ONLY): 0942 SLP Time Calculation (min) (ACUTE ONLY): 10 min  Past Medical History:  Past Medical History:  Diagnosis Date  . Adenomatous colon polyp 2007  . Arthritis   . Bilateral carotid bruits 07/27/2018  . CAD (coronary artery disease)   . Chronic back pain   . Gall stone pancreatitis   . Heart murmur   . High cholesterol   . Hx: UTI (urinary tract infection)    Now infrequent  . Hypertension   . Left knee pain Nov. 18, 2014  . Onychomycosis   . Osteoarthritis   . Osteopenia   . PAD (peripheral artery disease) ([place])   . PVD (peripheral vascular disease) ([place])   . Skin ulcer of left heel ([place])   . Trigger finger, right   . Vertigo    Past Surgical History:  Past Surgical History:  Procedure Laterality Date  . ABDOMINAL HYSTERECTOMY     partial  . CHOLECYSTECTOMY  10/30/2011   Procedure: LAPAROSCOPIC CHOLECYSTECTOMY WITH INTRAOPERATIVE CHOLANGIOGRAM;  Surgeon: Zenovia Jarred, MD;  Location: [facility];  Service: General;  Laterality: N/A;  . ENDARTERECTOMY FEMORAL Left 09/27/2018   Procedure: left Endarterectomy Femoral;  Surgeon: Angelia Mould, MD;  Location: [facility];  Service: Vascular;  Laterality: Left;  . EUS  11/11/2011   Procedure: ESOPHAGEAL ENDOSCOPIC ULTRASOUND (EUS) RADIAL;  Surgeon: Arta Silence, MD;  Location: [facility];  Service: Endoscopy;  Laterality: N/A;  possible ERCP  . EYE SURGERY    . FEMORAL-POPLITEAL BYPASS GRAFT Left 09/27/2018   Procedure: left FEMORAL THROMBECTOMY;  Surgeon: Angelia Mould, MD;  Location: [facility];  Service: Vascular;  Laterality: Left;  . JOINT REPLACEMENT Left 06-22-06   Hip  . JOINT REPLACEMENT Right 11-26-06   Shoulder  . JOINT REPLACEMENT Right 02-27-06   Knee  . LOWER EXTREMITY ANGIOGRAPHY N/A 09/27/2018    Procedure: LOWER EXTREMITY ANGIOGRAPHY;  Surgeon: Nigel Mormon, MD;  Location: [facility];  Service: Cardiovascular;  Laterality: N/A;  . PATCH ANGIOPLASTY Left 09/27/2018   Procedure: Patch Angioplasty of left femoral artery using xenosure bovine pericardium patch;  Surgeon: Angelia Mould, MD;  Location: [facility];  Service: Vascular;  Laterality: Left;  . PERIPHERAL VASCULAR INTERVENTION  09/27/2018   Procedure: PERIPHERAL VASCULAR INTERVENTION;  Surgeon: Nigel Mormon, MD;  Location: [facility];  Service: Cardiovascular;;  . TOTAL HIP ARTHROPLASTY    . TOTAL KNEE ARTHROPLASTY    . TOTAL SHOULDER ARTHROPLASTY     HPI:  [name] is a 86 y.o. female with medical history significant of hypertension, hyperlipidemia, spinal stenosis, PAD status post angioplasty to left SFA on 09/27/2018, dementia, CVA, arthritis presents to emergency department for evaluation of altered mental status. Pt has mild demntia at baseline, but typically oriented x3 MRI 9/28: "Acute infarct involving the left corona radiata. No substantial mass effect."  CXR 9/28: "Increasing density in the RIGHT infrahilar region with concomitant RIGHT effusion. This may represent pneumonia with concomitanteffusion aspiration could also have this distribution, correlatewith any risk factors for aspiration."   Assessment / Plan / Recommendation Clinical Impression  Pt presents with moderate risk of aspiration in setting of new L corona radiata infarct and possible pneumonia with known hx of silent aspiration.  Pt was unable to follow any directions today during evaluation  and OME could not be completed.  Response to nearly all SLP questions and instructions was "What?"  With thin liquid, pt took small sips and required 3-4 swallow per bolus.  With puree, pt had difficulty retrieving bolus from spoon.  With regular solid, pt was unable to bite cracker, but when small piece of cracker was placed in oral  cavity, pt was able to masticate and clear solid.  There were no overt s/s of aspiration with puree and solid textures.   Given pt's hx of silent aspiration, possible pna, and location of new infarct, recommend MBSS prior to initiation of PO diet.  MBSS scheduled for later this date.    If pt's speech-language and cognitive function does not return to baseline, please consider a cognitive-linguistic evaluation.   SLP Visit Diagnosis: Dysphagia, unspecified (R13.10)    Aspiration Risk  Moderate aspiration risk    Diet Recommendation NPO        Other  Recommendations Recommended Consults:  (Consider cognitive linguistic evaluation) Oral Care Recommendations: Oral care BID   Follow up Recommendations  (TBD)      Frequency and Duration  (TBD)          Prognosis Prognosis for Safe Diet Advancement: Fair Barriers to Reach Goals: Cognitive deficits;Language deficits      Swallow Study   General Date of Onset: 02/13/20 HPI: [name] is a 86 y.o. female with medical history significant of hypertension, hyperlipidemia, spinal stenosis, PAD status post angioplasty to left SFA on 09/27/2018, dementia, CVA, arthritis presents to emergency department for evaluation of altered mental status. Pt has mild demntia at baseline, but typically oriented x3 MRI 9/28: "Acute infarct involving the left corona radiata. No substantial mass effect."  CXR 9/28: "Increasing density in the RIGHT infrahilar region with concomitant RIGHT effusion. This may represent pneumonia with concomitanteffusion aspiration could also have this distribution, correlatewith any risk factors for aspiration." Type of Study: Bedside Swallow Evaluation Previous Swallow Assessment: MBSS during admission 01/2019 Diet Prior to this Study: NPO Temperature Spikes Noted: No Respiratory Status: Room air History of Recent Intubation: No Behavior/Cognition: Alert;Confused;Doesn't follow directions Oral Cavity Assessment: Within  Functional Limits Oral Care Completed by SLP: No Oral Cavity - Dentition: Adequate natural dentition Self-Feeding Abilities: Total assist Patient Positioning: Upright in bed Baseline Vocal Quality: Normal Volitional Cough: Cognitively unable to elicit Volitional Swallow: Unable to elicit    Oral/Motor/Sensory Function Overall Oral Motor/Sensory Function:  (Unable to assess)   Ice Chips Ice chips: Not tested   Thin Liquid Thin Liquid: Impaired Pharyngeal  Phase Impairments: Multiple swallows    Nectar Thick Nectar Thick Liquid: Not tested   Honey Thick Honey Thick Liquid: Not tested   Puree Puree: Impaired Presentation: Spoon Oral Phase Impairments: Poor awareness of bolus   Solid     Solid: Impaired Oral Phase Impairments: Impaired mastication;Poor awareness of bolus Oral Phase Functional Implications: Prolonged oral transit      Celedonio Savage, MA, [place] Office: [phone] 02/14/2020,10:06 AM

## 2020-02-14 NOTE — Progress Notes (Signed)
Pt BP elevated, Dr. Hal Hope informed and gave V.O. "to treat BP if SBP >220", repeat and read back.

## 2020-02-14 NOTE — Telephone Encounter (Signed)
FYI

## 2020-02-14 NOTE — Evaluation (Addendum)
Occupational Therapy Evaluation Patient Details Name: [name] MRN: [id number] DOB: 1930-09-26 Today's Date: 02/14/2020    History of Present Illness pt is 86 y.o. female with medical history significant of hypertension, hyperlipidemia, spinal stenosis, PAD status post angioplasty to left SFA on 09/27/2018, dementia, CVA, arthritis presents to emergency department for evaluation of altered mental status. CT head negative for acute findings, Chest x-ray shows: Increased density in right infrahilar region with concomitant right effusion.? pna.    Clinical Impression   Pt seen this date for Acute OT eval; social and PLOF provided by daughter present during assessment. Pt is from facility, with she receives A for all ADL's and transfers/mobility with use of RW. At this time, pt Ox2, slowed processing and difficulty with command following which daughter reports is near baseline. At this time pt requiring extensive assist for transition to and from EOB as well as for STS/toilet simulated transfer as pt with posterior lean and decreased righting reactions noted during session. Pt overall with decreased strength, balance, and activity tolerance leading to increased A required for ADL's, OT will continue to follow acutely, with rec for d/c to return to facility with continued post acute therapy. Listed below.     Follow Up Recommendations  SNF;Supervision/Assistance - 24 hour    Equipment Recommendations  None recommended by OT (facility wtih all necessary equip)    Recommendations for Other Services       Precautions / Restrictions Precautions Precautions: Fall Restrictions Weight Bearing Restrictions: No      Mobility Bed Mobility Overal bed mobility: Needs Assistance Bed Mobility: Rolling;Supine to Sit;Sit to Supine Rolling: Max assist   Supine to sit: Max assist;HOB elevated Sit to supine: HOB elevated;Max assist   General bed mobility comments: cues required for sequencing of  all tasks, max A to safely achieve return to supine with A at legs and trunk for descent  Transfers Overall transfer level: Needs assistance   Transfers: Sit to/from Stand Sit to Stand: Max assist         General transfer comment: decreased safety, cues for initation and sequencing of task at EOB.     Balance Overall balance assessment: Needs assistance Sitting-balance support: Bilateral upper extremity supported;Feet supported     Postural control: Posterior lean      ADL either performed or assessed with clinical judgement   ADL Overall ADL's : Needs assistance/impaired Eating/Feeding: Set up;Minimal assistance               Upper Body Dressing : Moderate assistance   Lower Body Dressing: Total assistance;Cueing for sequencing       Toileting- Clothing Manipulation and Hygiene: Maximal assistance;Total assistance       Functional mobility during ADLs:  (unable to safely complete mobility this date )  General ADL Comments: pt requiring A for ADL's at baseline from staff at facility     Vision Baseline Vision/History: Wears glasses Wears Glasses: At all times       Perception     Praxis      Pertinent Vitals/Pain Pain Assessment: Faces Pain Score: 0-No pain Faces Pain Scale: No hurt     Hand Dominance Right   Extremity/Trunk Assessment Upper Extremity Assessment Upper Extremity Assessment: Overall WFL for tasks assessed, generally decreased strength and slowed coordination both FM/GM    Lower Extremity Assessment Lower Extremity Assessment: Defer to PT evaluation   Cervical / Trunk Assessment Cervical / Trunk Assessment: Kyphotic   Communication Communication Communication: No difficulties  Cognition Arousal/Alertness: Awake/alert Behavior During Therapy: WFL for tasks assessed/performed Overall Cognitive Status: History of cognitive impairments - at baseline                                     General Comments  pt  limited in safety d/t cognitive function, cues and redirection required throughout session    Exercises     Shoulder Instructions      Home Living Family/patient expects to be discharged to:: Skilled nursing facility                                        Prior Functioning/Environment Level of Independence: Needs assistance  Gait / Transfers Assistance Needed: use of RW with staff assist for mobility ADL's / Homemaking Assistance Needed: A for all ADL's aside from self feeding and grooming seated,             OT Problem List: Decreased strength;Decreased activity tolerance;Impaired balance (sitting and/or standing)      OT Treatment/Interventions: Self-care/ADL training;Therapeutic exercise;DME and/or AE instruction;Therapeutic activities;Patient/family education;Balance training    OT Goals(Current goals can be found in the care plan section) Acute Rehab OT Goals Patient Stated Goal: none stated OT Goal Formulation: With patient/family Time For Goal Achievement: 02/28/20 ADL Goals Pt Will Perform Grooming: with set-up Pt Will Transfer to Toilet: with min guard assist Additional ADL Goal #1: Pt will tolerate static standing for 2 minutes with CGA and use of DMe/PRN in prep for participation with basic grooming tasks at sink  OT Frequency: Min 1X/week   Barriers to D/C:            Co-evaluation              AM-PAC OT "6 Clicks" Daily Activity     Outcome Measure Help from another person eating meals?: A Little Help from another person taking care of personal grooming?: A Little Help from another person toileting, which includes using toliet, bedpan, or urinal?: Total Help from another person bathing (including washing, rinsing, drying)?: A Lot Help from another person to put on and taking off regular upper body clothing?: A Lot Help from another person to put on and taking off regular lower body clothing?: Total 6 Click Score: 12   End of  Session Equipment Utilized During Treatment: Gait belt  Activity Tolerance: Patient limited by fatigue Patient left: in bed;with bed alarm set;with call bell/phone within reach;with family/visitor present  OT Visit Diagnosis: Unsteadiness on feet (R26.81);Muscle weakness (generalized) (M62.81)                Time: 1610-9604 OT Time Calculation (min): 23 min Charges:  OT General Charges $OT Visit: 1 Visit OT Evaluation $OT Eval Moderate Complexity: 1 Mod  Giovanny Dugal OTR/L acute rehab services Office: [phone]  Joya Gaskins 02/14/2020, 3:35 PM

## 2020-02-14 NOTE — Plan of Care (Signed)
  Problem: Education: Goal: Knowledge of General Education information will improve Description: Including pain rating scale, medication(s)/side effects and non-pharmacologic comfort measures Outcome: Progressing   Problem: Health Behavior/Discharge Planning: Goal: Ability to manage health-related needs will improve Outcome: Progressing   Problem: Clinical Measurements: Goal: Ability to maintain clinical measurements within normal limits will improve Outcome: Progressing Goal: Will remain free from infection Outcome: Progressing Goal: Diagnostic test results will improve Outcome: Progressing Goal: Respiratory complications will improve Outcome: Progressing Goal: Cardiovascular complication will be avoided Outcome: Progressing   Problem: Activity: Goal: Risk for activity intolerance will decrease Outcome: Progressing   Problem: Nutrition: Goal: Adequate nutrition will be maintained Outcome: Progressing   Problem: Coping: Goal: Level of anxiety will decrease Outcome: Progressing   Problem: Elimination: Goal: Will not experience complications related to bowel motility Outcome: Progressing Goal: Will not experience complications related to urinary retention Outcome: Progressing   Problem: Pain Managment: Goal: General experience of comfort will improve Outcome: Progressing   Problem: Safety: Goal: Ability to remain free from injury will improve Outcome: Progressing   Problem: Skin Integrity: Goal: Risk for impaired skin integrity will decrease Outcome: Progressing   Problem: Education: Goal: Knowledge of disease or condition will improve Outcome: Progressing Goal: Knowledge of secondary prevention will improve Outcome: Progressing Goal: Knowledge of patient specific risk factors addressed and post discharge goals established will improve Outcome: Progressing Goal: Individualized Educational Video(s) Outcome: Progressing   Problem: Health Behavior/Discharge  Planning: Goal: Ability to manage health-related needs will improve Outcome: Progressing

## 2020-02-14 NOTE — Progress Notes (Signed)
Modified Barium Swallow Progress Note  Patient Details  Name: [name] MRN: [id number] Date of Birth: [birth year]  Today's Date: 02/14/2020  Modified Barium Swallow completed.  Full report located under Chart Review in the Imaging Section.  Brief recommendations include the following:  Clinical Impression   Pt has a significant oropharyngeal dysphagia with suspected neurological and structural components with likely more chronic impact. Pt appears to have cervical osteophytes that narrow her pharyngeal space, reducing her ability to deflect her epiglottis and leaving her laryngeal vestibule open to penetration and aspiration during the swallow. She also has reduced base of tongue retraction and limited UES opening with moderate pharyngeal residue. Thin and nectar thick liquids have a harder time clearing through the UES, with residue largely remaining in the pyriform sinuses and entering the airway after the swallow as well. Residue from honey thick liquids and purees is better contained in her valleculae, filling this space completely but also giving her more time to perform dry swallow over time (does not do them to command) with good airway protection as it works on clearing. From an oral standpoint, pt also has weak lingual manipulation, limited bolus cohesion, and prolonged posterior transit with premature spillage that occurs. Of note, after the study when pt was partially reclined for transfer back to bed, she had regurgitation initially of what appeared to be secretions but then also a small amount of barium. Suspect that this came from her esophagus as her pharynx was almost completely clear as fluoroscopy ended. Pt said "yes" that she does get reflux sometimes (unclear accuracy as not a listed dx). Recommend starting Dys 1 (puree) diet and honey thick liquids, maintaining an upright position for at least 30 minutes after intake. Given significantly modified diet and concern for more  chronic elements, would consider also talking to the pt/family about overall GOC.    Swallow Evaluation Recommendations       SLP Diet Recommendations: Dysphagia 1 (Puree) solids;Honey thick liquids   Liquid Administration via: Cup;Straw   Medication Administration: Crushed with puree   Supervision: Staff to assist with self feeding;Full supervision/cueing for compensatory strategies   Compensations: Slow rate;Small sips/bites   Postural Changes: Remain semi-upright after after feeds/meals (Comment);Seated upright at 90 degrees   Oral Care Recommendations: Oral care BID   Other Recommendations: Order thickener from pharmacy;Prohibited food (jello, ice cream, thin soups);Remove water pitcher    Osie Bond., M.A. [place] Pager [phone] Office [phone]  02/14/2020,2:05 PM

## 2020-02-14 NOTE — NC FL2 (Addendum)
[place] LEVEL OF CARE SCREENING TOOL     IDENTIFICATION  Patient Name: [name] Birthdate: [birth year] Sex: female Admission Date (Current Location): 02/13/2020  [facility] and [place] Number:  Herbalist and Address:  [facility], [place] [address], [place], [place] [address]      Provider Number: [id number]  Attending Physician Name and Address:  Edwin Dada, *  Relative Name and Phone Number:       Current Level of Care: Hospital Recommended Level of Care: [place] Prior Approval Number:    Date Approved/Denied:   PASRR Number: [id number]  Discharge Plan: SNF    Current Diagnoses: Patient Active Problem List   Diagnosis Date Noted   AMS (altered mental status) 02/13/2020   Stroke (cerebrum) ([place]) 01/17/2019   PAD (peripheral artery disease) ([place]) 09/27/2018   Bilateral carotid bruits 07/27/2018   Pain in limb-Left Knee 04/04/2013   Peripheral vascular disease, unspecified ([place]) 04/04/2013   Choledocholithiasis with obstruction 10/30/2011   PVD (peripheral vascular disease) ([place]) 10/29/2011   Pancreatitis 10/28/2011   Cholelithiasis 10/28/2011   UTI (urinary tract infection) 10/28/2011   Hypertension 10/28/2011   Hypercholesterolemia 10/28/2011    Orientation RESPIRATION BLADDER Height & Weight     Self  Normal Incontinent Weight: 148 lb 5.9 oz (67.3 kg) Height:     BEHAVIORAL SYMPTOMS/MOOD NEUROLOGICAL BOWEL NUTRITION STATUS      Continent Diet (See DC Summary)  AMBULATORY STATUS COMMUNICATION OF NEEDS Skin   Extensive Assist Verbally Normal                       Personal Care Assistance Level of Assistance  Bathing, Feeding, Dressing Bathing Assistance: Maximum assistance Feeding assistance: Maximum assistance Dressing Assistance: Maximum assistance     Functional Limitations Info  Speech     Speech Info: Impaired (Dysarthria)    SPECIAL CARE  FACTORS FREQUENCY  PT (By licensed PT), OT (By licensed OT), Speech therapy     PT Frequency: 5xweek OT Frequency: 5xweek     Speech Therapy Frequency: 5xweek      Contractures Contractures Info: Not present    Additional Factors Info  Code Status, Allergies Code Status Info: DNR Allergies Info: Penicillins,Sulfa Antibiotics, Hydrochlorothiazide           Current Medications (02/14/2020):  This is the current hospital active medication list Current Facility-Administered Medications  Medication Dose Route Frequency Provider Last Rate Last Admin   acetaminophen (TYLENOL) tablet 650 mg  650 mg Oral Q6H PRN Pahwani, Rinka R, MD       Or   acetaminophen (TYLENOL) suppository 650 mg  650 mg Rectal Q6H PRN Pahwani, Rinka R, MD       Ampicillin-Sulbactam (UNASYN) 3 g in sodium chloride 0.9 % 100 mL IVPB  3 g Intravenous Q6H Amedeo Plenty, RPH 200 mL/hr at 02/14/20 0847 3 g at 02/14/20 0847   Chlorhexidine Gluconate Cloth 2 % PADS 6 each  6 each Topical Q0600 Pahwani, Rinka R, MD       enoxaparin (LOVENOX) injection 40 mg  40 mg Subcutaneous Q24H Pahwani, Rinka R, MD   40 mg at 02/13/20 1852   LORazepam (ATIVAN) injection 0.5 mg  0.5 mg Intravenous Once Pahwani, Rinka R, MD       ondansetron (ZOFRAN) tablet 4 mg  4 mg Oral Q6H PRN Pahwani, Rinka R, MD       Or   ondansetron (ZOFRAN)  injection 4 mg  4 mg Intravenous Q6H PRN Pahwani, Rinka R, MD         Discharge Medications: Please see discharge summary for a list of discharge medications.  Relevant Imaging Results:  Relevant Lab Results:   Additional Information SS#: [id number]  [place] Setting, Student-Social Work

## 2020-02-14 NOTE — Consult Note (Signed)
NEUROLOGY CONSULTATION NOTE   Date of service: February 14, 2020 Patient Name: [name] MRN:  [id number] DOB:  [birth year] Reason for consult: "Stroke on MRI"  History of Present Illness  [name] is a 86 y.o. female with PMH significant for dementia, HTN, PAD, orthostatic hypotension, TIA  who presents with AMS x 2-3 days. Workup with MRI Brain with advanced microvascular disease and a left corona radiata infarct. Also being treated for a R lower lobe pneumonia with parapneumonic effusions.  She is pleasant and confused on my evaluation. Denies any arm or leg weakness, no numbness, no dysarthria, no facial droop. She is not sure why she is at the hospital. Feels like she should not be here. Does have a mild cough.  Denies any pain.  NIHSS: 1 for unable to state month correctly Premorbid mRS: 3 TPA: unclear LKW, low NIHSS. Thrombectomy: No, low NIHSS.   ROS   Unable to obtain a detailed review of system despite attempt due to confusion.  Past History   Past Medical History:  Diagnosis Date  . Adenomatous colon polyp 2007  . Arthritis   . Bilateral carotid bruits 07/27/2018  . CAD (coronary artery disease)   . Chronic back pain   . Gall stone pancreatitis   . Heart murmur   . High cholesterol   . Hx: UTI (urinary tract infection)    Now infrequent  . Hypertension   . Left knee pain Nov. 18, 2014  . Onychomycosis   . Osteoarthritis   . Osteopenia   . PAD (peripheral artery disease) ([place])   . PVD (peripheral vascular disease) ([place])   . Skin ulcer of left heel ([place])   . Trigger finger, right   . Vertigo    Past Surgical History:  Procedure Laterality Date  . ABDOMINAL HYSTERECTOMY     partial  . CHOLECYSTECTOMY  10/30/2011   Procedure: LAPAROSCOPIC CHOLECYSTECTOMY WITH INTRAOPERATIVE CHOLANGIOGRAM;  Surgeon: Zenovia Jarred, MD;  Location: [facility];  Service: General;  Laterality: N/A;  . ENDARTERECTOMY FEMORAL Left 09/27/2018   Procedure: left  Endarterectomy Femoral;  Surgeon: Angelia Mould, MD;  Location: [facility];  Service: Vascular;  Laterality: Left;  . EUS  11/11/2011   Procedure: ESOPHAGEAL ENDOSCOPIC ULTRASOUND (EUS) RADIAL;  Surgeon: Arta Silence, MD;  Location: [facility];  Service: Endoscopy;  Laterality: N/A;  possible ERCP  . EYE SURGERY    . FEMORAL-POPLITEAL BYPASS GRAFT Left 09/27/2018   Procedure: left FEMORAL THROMBECTOMY;  Surgeon: Angelia Mould, MD;  Location: [facility];  Service: Vascular;  Laterality: Left;  . JOINT REPLACEMENT Left 06-22-06   Hip  . JOINT REPLACEMENT Right 11-26-06   Shoulder  . JOINT REPLACEMENT Right 02-27-06   Knee  . LOWER EXTREMITY ANGIOGRAPHY N/A 09/27/2018   Procedure: LOWER EXTREMITY ANGIOGRAPHY;  Surgeon: Nigel Mormon, MD;  Location: [facility];  Service: Cardiovascular;  Laterality: N/A;  . PATCH ANGIOPLASTY Left 09/27/2018   Procedure: Patch Angioplasty of left femoral artery using xenosure bovine pericardium patch;  Surgeon: Angelia Mould, MD;  Location: [facility];  Service: Vascular;  Laterality: Left;  . PERIPHERAL VASCULAR INTERVENTION  09/27/2018   Procedure: PERIPHERAL VASCULAR INTERVENTION;  Surgeon: Nigel Mormon, MD;  Location: [facility];  Service: Cardiovascular;;  . TOTAL HIP ARTHROPLASTY    . TOTAL KNEE ARTHROPLASTY    . TOTAL SHOULDER ARTHROPLASTY     Family History  Problem Relation Age of Onset  . Cancer Mother  ovarian  . Pneumonia Father   . Heart attack Brother   . Heart disease Brother        Heart Disease before age 73  . Healthy Child    Social History   Socioeconomic History  . Marital status: Widowed    Spouse name: Not on file  . Number of children: 5  . Years of education: College  . Highest education level: Some college, no degree  Occupational History  . Not on file  Tobacco Use  . Smoking status: Never Smoker  . Smokeless tobacco: Never Used  Vaping Use  . Vaping Use: Never used   Substance and Sexual Activity  . Alcohol use: No    Alcohol/week: 0.0 standard drinks  . Drug use: No  . Sexual activity: Not on file  Other Topics Concern  . Not on file  Social History Narrative   No caffeine use   Right Handed   Lives in a skilled nursing home    Social Determinants of Health   Financial Resource Strain:   . Difficulty of Paying Living Expenses: Not on file  Food Insecurity:   . Worried About Charity fundraiser in the Last Year: Not on file  . Ran Out of Food in the Last Year: Not on file  Transportation Needs:   . Lack of Transportation (Medical): Not on file  . Lack of Transportation (Non-Medical): Not on file  Physical Activity:   . Days of Exercise per Week: Not on file  . Minutes of Exercise per Session: Not on file  Stress:   . Feeling of Stress : Not on file  Social Connections:   . Frequency of Communication with Friends and Family: Not on file  . Frequency of Social Gatherings with Friends and Family: Not on file  . Attends Religious Services: Not on file  . Active Member of Clubs or Organizations: Not on file  . Attends Archivist Meetings: Not on file  . Marital Status: Not on file   Allergies  Allergen Reactions  . Hydrochlorothiazide Other (See Comments)    Dropped sodium level  . Penicillins     Unknown  Did it involve swelling of the face/tongue/throat, SOB, or low BP? Unknown Did it involve sudden or severe rash/hives, skin peeling, or any reaction on the inside of your mouth or nose? Unknown Did you need to seek medical attention at a hospital or doctor's office? Unknown When did it last happen?unknown If all above answers are "NO", may proceed with cephalosporin use.   . Sulfa Antibiotics     unknown    Medications   Medications Prior to Admission  Medication Sig Dispense Refill Last Dose  . acetaminophen (TYLENOL) 325 MG tablet Take 650 mg by mouth in the morning, at noon, and at bedtime. Take with Ultram     02/12/2020 at Unknown time  . amLODipine (NORVASC) 10 MG tablet Take 1 tablet (10 mg total) by mouth daily. 90 tablet 3 02/12/2020 at Unknown time  . aspirin 81 MG tablet Take 81 mg by mouth daily.   02/12/2020 at Unknown time  . Carboxymethylcellulose Sodium (THERATEARS) 0.25 % SOLN Apply 1 drop to eye in the morning and at bedtime. Apply 1 drop in each eye twice daily for dry eyes.   02/12/2020 at Unknown time  . cetirizine (ZYRTEC) 10 MG tablet Take 10 mg by mouth daily.   02/12/2020 at Unknown time  . Cholecalciferol 50 MCG (2000 UT) TABS Take 2,000  Units by mouth daily.    02/12/2020 at Unknown time  . famotidine (PEPCID) 20 MG tablet Take 20 mg by mouth daily.   02/12/2020 at Unknown time  . ondansetron (ZOFRAN) 4 MG tablet Take 4 mg by mouth 2 (two) times daily as needed for nausea or vomiting.   unk  . pravastatin (PRAVACHOL) 20 MG tablet Take 1 tablet (20 mg total) by mouth daily at 6 PM. 30 tablet 0 02/12/2020 at Unknown time  . sertraline (ZOLOFT) 50 MG tablet Take 75 mg by mouth daily. Give 1 1/2 tablets to equal 75 mg by mouth every morning   02/12/2020 at Unknown time  . traMADol (ULTRAM) 50 MG tablet Take 25 mg by mouth 3 (three) times daily. Give 1/2 tablet by mouth 3 times daily for pain.   02/12/2020 at Unknown time  . valsartan (DIOVAN) 40 MG tablet Take 40 mg by mouth daily.   02/12/2020 at Unknown time     Vitals  Temp:  [97.6 F (36.4 C)-99.2 F (37.3 C)] 97.7 F (36.5 C) (09/29 1911) Pulse Rate:  [80-93] 91 (09/29 1911) Resp:  [16-18] 18 (09/29 1911) BP: (119-183)/(62-110) 147/62 (09/29 1911) SpO2:  [92 %-96 %] 93 % (09/29 1911) Weight:  [67.2 kg-67.3 kg] 67.3 kg (09/29 0344)  Body mass index is 28.98 kg/m.  Physical Exam   General: Laying comfortably in bed; in no acute distress.  HENT: Normal oropharynx and mucosa. Normal external appearance of ears and nose. Neck: Supple, no pain or tenderness CV: No JVD. No peripheral edema. Pulmonary: Symmetric Chest rise. Normal  respiratory effort. Abdomen: Soft to touch, non-tender Ext: No cyanosis, edema, or deformity  Skin: No rash. Normal palpation of skin.   Musculoskeletal: Normal digits and nails by inspection. No clubbing.  Neurologic Examination  Mental status/Cognition: Alert, oriented to self, place, but not to month and year, poor attention. Speech/language: Fluent, comprehension intact, object naming intact. Cranial nerves:   CN II Anisocoria with left pupil larger than Right. Both reactive to light, no VF deficits   CN III,IV,VI EOM intact, no gaze preference or deviation, no nystagmus   CN V normal sensation in V1, V2, and V3 segments bilaterally   CN VII no asymmetry, no nasolabial fold flattening   CN VIII normal hearing to speech   CN IX & X normal palatal elevation, no uvular deviation   CN XI 5/5 head turn and 5/5 shoulder shrug bilaterally   CN XII midline tongue protrusion   Motor:  Muscle bulk: poor, tone normal Mvmt Root Nerve  Muscle Right Left Comments  SA C5/6 Ax Deltoid 4+ 4+   EF C5/6 Mc Biceps 4+ 4+   EE C6/7/8 Rad Triceps 4+ 4+   WF C6/7 Med FCR 4+ 4+   WE C7/8 PIN ECU 4+ 4+   F Ab C8/T1 U ADM/FDI 4+ 4+   HF L1/2/3 Fem Illopsoas 4+ 4+   KE L2/3/4 Fem Quad 4+ 4+   DF L4/5 D Peron Tib Ant 5 5   PF S1/2 Tibial Grc/Sol 5 5    Reflexes:  Right Left Comments  Pectoralis      Biceps (C5/6) 3 3   Brachioradialis (C5/6) 3 3    Triceps (C6/7) 3 3    Patellar (L3/4) 3 3 Cross adductors +   Achilles (S1) 4 4 BL clonus with atleast 3-4 beats.   Hoffman      Plantar     Jaw jerk    Sensation:  Light touch  Pin prick    Temperature    Vibration   Proprioception    Coordination/Complex Motor:  - Finger to Nose intact BL - Heel to shin unable to get her to understand and perform. - Rapid alternating movement are slowed BL  Labs   Lab Results  Component Value Date   NA 140 02/14/2020   K 3.0 (L) 02/14/2020   CL 103 02/14/2020   CO2 28 02/14/2020   GLUCOSE 92  02/14/2020   BUN 6 (L) 02/14/2020   CREATININE 0.75 02/14/2020   CALCIUM 8.6 (L) 02/14/2020   ALBUMIN 3.0 (L) 02/14/2020   AST 15 02/14/2020   ALT 10 02/14/2020   ALKPHOS 80 02/14/2020   BILITOT 1.0 02/14/2020   GFRNONAA >60 02/14/2020   GFRAA >60 02/14/2020     Imaging and Diagnostic studies  MRI Brain without contrast:  1. Acute infarct involving the left corona radiata. No substantial mass effect. 2. Similar extensive chronic microvascular ischemic change and generalized cerebral atrophy.   Impression   [name] is a 86 y.o. female with PMH significant for significant for dementia, HTN, PAD, orthostatic hypotension, TIA  who presents with AMS x 2-3 days. Workup with MRI Brain with advanced microvascular disease and a left corona radiata infarct.  Her neurologic examination is notable for no focal deficits with hypderreflexia but no other upper motor neuron signs, no sensory level, she does appear to be encephalopathic with some concern for an underlying cognitive deficit.  Recommendations   - Brain imaging- MRI Brain with L corona radiata infarct. - Vascular imaging- MRA Head without contrast and Carotid doppler US BL - TTE - pending - Lipid panel - ordered  - Statin - Can start Atorvastatin if LDL > 70 - A1C - ordered  - Antithrombotic - Continue home aspirin. - DVT ppx - SQ Heparin - SBP goal - aim for gradual normotension - Telemetry monitoring for arrythmia - Swallow screen - Stroke education - PT/OT/SLP consulted  ______________________________________________________________________   Thank you for the opportunity to take part in the care of this patient. If you have any further questions, please contact the neurology consultation attending.  Signed,  [place] Pager Number [phone]

## 2020-02-14 NOTE — Progress Notes (Signed)
[facility] PROGRESS NOTE    [name]  QAS:[id number] DOB: 04/20/1931 DOA: 02/13/2020 PCP: Javier Glazier, MD      Brief Narrative:  [name] is a 86 y.o. F with moderate dementia, facility dwelling, HTN, CVA, orthostatic hypotension, and PVD status post angioplasty 1 year ago who presented with altered mental status.  Patient noticed by staff at her facility to be unable to follow commands and unable to talk.  In the ER, hypertensive, WBC normal, ammonia normal.  CT head unremarkable.  Chest x-ray showed pneumonia with small effusion.  MRI brain showed lacunar infarct.         Assessment & Plan:  Acute metabolic encephalopathy in setting of dementia This appears to be mild metabolic encephalopathy, improving slightly, due to infection and stroke.   Right lower lobe pneumonia, parapneumonic effusion Suspect aspiration -Continue Unasyn -SLP eval  Acute right corona radiata subcentimeter infarct MRI brain shows small stroke Likely small vessel disease -Consult Neurology, appreciate cares   -Continue pravastatin -Aspirin ordered   -Atrial fibrillation: Not present -tPA not given because stroke symptoms to mild, onset unclear -Dysphagia screen ordered in ER -PT eval ordered: recommended skilled rehab -Smoking cessation: Not pertinent   Hypertension Peripheral vascular disease Given the small nature of the stroke, improvement already, do not suspect that permissive hypertension is needed at this point -Resume irbesartan, amlodipine, pravastatin, baby aspirin  Hypokalemia Hypomagnesemia -Replete potassium and magnesium  Dementia  -Continue sertraline       Disposition: Status is: Inpatient  Remains inpatient appropriate because:Ongoing diagnostic testing needed not appropriate for outpatient work up   Dispo: The patient is from: SNF              Anticipated d/c is to: SNF              Anticipated d/c date is: 1 day               Patient currently is not medically stable to d/c.   Patient was admitted with acute metabolic encephalopathy from stroke and aspiration pneumonia.  She remains encephalopathic and confused relative to her baseline, and will need neurology work-up for stroke.  Hopefully she will be completed within 24 hours and home tomorrow.           MDM: The below labs and imaging reports were reviewed and summarized above.  Medication management as above.   DVT prophylaxis: enoxaparin (LOVENOX) injection 40 mg Start: 02/13/20 1800 SCDs Start: 02/13/20 1225  Code Status: DNR Family Communication: [name] at the bedside    Consultants:   Neurology  Procedures:   Carotid ultrasound  Echocardiogram  Antimicrobials:   Unasyn          Subjective: Patient still somewhat confused, but this is improved from yesterday.  She is interactive.  She is a bit agitated.  No fever.  No cough.  No chest pain.  No dyspnea.  No respiratory distress.  Objective: Vitals:   02/14/20 0600 02/14/20 0722 02/14/20 1226 02/14/20 1656  BP: (!) 153/69 (!) 150/72 (!) 145/67 119/80  Pulse: 87 83 93 91  Resp:    18  Temp: 99.2 F (37.3 C) 98.7 F (37.1 C) 99.2 F (37.3 C) 98.1 F (36.7 C)  TempSrc: Axillary Axillary Axillary Axillary  SpO2: 95% 95% 94% 92%  Weight:        Intake/Output Summary (Last 24 hours) at 02/14/2020 1714 Last data filed at 02/14/2020 0404 Gross per 24 hour  Intake 301.83 ml  Output 450 ml  Net -148.17 ml   Filed Weights   02/14/20 0335 02/14/20 0344  Weight: 67.2 kg 67.3 kg    Examination: General appearance:  adult female, alert and in mild distress from confusion and disorientation.   HEENT: Anicteric, conjunctiva pink, lids and lashes normal. No nasal deformity, discharge, epistaxis.  Lips moist.   Skin: Warm and dry.  No jaundice.  No suspicious rashes or lesions. Cardiac: RRR, nl S1-S2, no murmurs appreciated.  Capillary refill is brisk.  JVP  normal.  No LE edema.  Radial pulses 2+ and symmetric. Respiratory: Normal respiratory rate and rhythm.  CTAB without rales or wheezes. Abdomen: Abdomen soft.  No TTP or guarding. No ascites, distension, hepatosplenomegaly.   MSK: No deformities or effusions. Neuro: Awake and alert.  EOMI, moves all extremities with generalized weakness, but symmetric strength and coordination. Speech fluent.    Psych: Sensorium intact and responding to questions, attention normal. Affect anxious.  Judgment and insight appear impaired.    Data Reviewed: I have personally reviewed following labs and imaging studies:  CBC: Recent Labs  Lab 02/13/20 0941 02/13/20 1011 02/13/20 1037 02/13/20 1259 02/14/20 0632  WBC 8.0  --   --  9.4 7.8  NEUTROABS 5.1  --   --   --   --   HGB 12.6 13.3 12.2 12.3 12.2  HCT 39.5 39.0 36.0 38.9 37.5  MCV 99.0  --   --  102.1* 98.4  PLT 272  --   --  255 378   Basic Metabolic Panel: Recent Labs  Lab 02/13/20 0941 02/13/20 1011 02/13/20 1037 02/13/20 1259 02/14/20 0632  NA 140 141 140  --  140  K 3.5 3.5 3.4*  --  3.0*  CL 104 100  --   --  103  CO2 30  --   --   --  28  GLUCOSE 124* 118*  --   --  92  BUN 14 14  --   --  6*  CREATININE 0.89 0.80  --  0.83 0.75  CALCIUM 9.0  --   --   --  8.6*  MG  --   --   --  1.6* 1.9  PHOS  --   --   --  3.1  --    GFR: Estimated Creatinine Clearance: 42.4 mL/min (by C-G formula based on SCr of 0.75 mg/dL). Liver Function Tests: Recent Labs  Lab 02/13/20 0941 02/14/20 0632  AST 16 15  ALT 11 10  ALKPHOS 89 80  BILITOT 0.8 1.0  PROT 5.8* 5.4*  ALBUMIN 3.4* 3.0*   No results for input(s): LIPASE, AMYLASE in the last 168 hours. Recent Labs  Lab 02/13/20 1008  AMMONIA 19   Coagulation Profile: Recent Labs  Lab 02/13/20 0941  INR 1.0   Cardiac Enzymes: No results for input(s): CKTOTAL, CKMB, CKMBINDEX, TROPONINI in the last 168 hours. BNP (last 3 results) No results for input(s): PROBNP in the last 8760  hours. HbA1C: No results for input(s): HGBA1C in the last 72 hours. CBG: Recent Labs  Lab 02/13/20 1012  GLUCAP 119*   Lipid Profile: No results for input(s): CHOL, HDL, LDLCALC, TRIG, CHOLHDL, LDLDIRECT in the last 72 hours. Thyroid Function Tests: Recent Labs    02/13/20 1259  TSH 0.706   Anemia Panel: No results for input(s): VITAMINB12, FOLATE, FERRITIN, TIBC, IRON, RETICCTPCT in the last 72 hours. Urine analysis:    Component Value Date/Time  COLORURINE YELLOW 02/13/2020 1329   APPEARANCEUR CLEAR 02/13/2020 1329   LABSPEC 1.009 02/13/2020 1329   PHURINE 6.0 02/13/2020 1329   GLUCOSEU NEGATIVE 02/13/2020 1329   HGBUR SMALL (A) 02/13/2020 1329   BILIRUBINUR NEGATIVE 02/13/2020 1329   KETONESUR 5 (A) 02/13/2020 1329   PROTEINUR NEGATIVE 02/13/2020 1329   UROBILINOGEN 0.2 10/27/2011 2204   NITRITE NEGATIVE 02/13/2020 1329   LEUKOCYTESUR NEGATIVE 02/13/2020 1329   Sepsis Labs: @LABRCNTIP (procalcitonin:4,lacticacidven:4)  ) Recent Results (from the past 240 hour(s))  Urine culture     Status: Abnormal   Collection Time: 02/13/20 10:08 AM   Specimen: Urine, Random  Result Value Ref Range Status   Specimen Description URINE, RANDOM  Final   Special Requests   Final    NONE Performed at [facility], [place] [address], [place], [place] [address]    Culture MULTIPLE SPECIES PRESENT, SUGGEST RECOLLECTION (A)  Final   Report Status 02/14/2020 FINAL  Final  Respiratory Panel by RT PCR (Flu A&B, Covid) - Nasopharyngeal Swab     Status: None   Collection Time: 02/13/20 10:15 AM   Specimen: Nasopharyngeal Swab  Result Value Ref Range Status   SARS Coronavirus 2 by RT PCR NEGATIVE NEGATIVE Final    Comment: (NOTE) SARS-CoV-2 target nucleic acids are NOT DETECTED.  The SARS-CoV-2 RNA is generally detectable in upper respiratoy specimens during the acute phase of infection. The lowest concentration of SARS-CoV-2 viral copies this assay can detect is 131 copies/mL.  A negative result does not preclude SARS-Cov-2 infection and should not be used as the sole basis for treatment or other patient management decisions. A negative result may occur with  improper specimen collection/handling, submission of specimen other than nasopharyngeal swab, presence of viral mutation(s) within the areas targeted by this assay, and inadequate number of viral copies (<131 copies/mL). A negative result must be combined with clinical observations, patient history, and epidemiological information. The expected result is Negative.  Fact Sheet for Patients:  PinkCheek.be  Fact Sheet for Healthcare Providers:  GravelBags.it  This test is no t yet approved or cleared by the [place] FDA and  has been authorized for detection and/or diagnosis of SARS-CoV-2 by FDA under an Emergency Use Authorization (EUA). This EUA will remain  in effect (meaning this test can be used) for the duration of the COVID-19 declaration under Section 564(b)(1) of the Act, 21 U.S.C. section 360bbb-3(b)(1), unless the authorization is terminated or revoked sooner.     Influenza A by PCR NEGATIVE NEGATIVE Final   Influenza B by PCR NEGATIVE NEGATIVE Final    Comment: (NOTE) The Xpert Xpress SARS-CoV-2/FLU/RSV assay is intended as an aid in  the diagnosis of influenza from Nasopharyngeal swab specimens and  should not be used as a sole basis for treatment. Nasal washings and  aspirates are unacceptable for Xpert Xpress SARS-CoV-2/FLU/RSV  testing.  Fact Sheet for Patients: PinkCheek.be  Fact Sheet for Healthcare Providers: GravelBags.it  This test is not yet approved or cleared by the [place] FDA and  has been authorized for detection and/or diagnosis of SARS-CoV-2 by  FDA under an Emergency Use Authorization (EUA). This EUA will remain  in effect (meaning this test  can be used) for the duration of the  Covid-19 declaration under Section 564(b)(1) of the Act, 21  U.S.C. section 360bbb-3(b)(1), unless the authorization is  terminated or revoked. Performed at [facility], [place] [address] [place],  [address]   Culture, blood (routine x 2)  Status: None (Preliminary result)   Collection Time: 02/13/20 12:58 PM   Specimen: BLOOD  Result Value Ref Range Status   Specimen Description BLOOD SITE NOT SPECIFIED  Final   Special Requests   Final    BOTTLES DRAWN AEROBIC AND ANAEROBIC Blood Culture adequate volume   Culture   Final    NO GROWTH < 24 HOURS Performed at [facility], [address] [place], [place] [address]    Report Status PENDING  Incomplete  Culture, blood (routine x 2)     Status: None (Preliminary result)   Collection Time: 02/13/20  7:02 PM   Specimen: BLOOD LEFT HAND  Result Value Ref Range Status   Specimen Description BLOOD LEFT HAND  Final   Special Requests   Final    BOTTLES DRAWN AEROBIC AND ANAEROBIC Blood Culture results may not be optimal due to an inadequate volume of blood received in culture bottles   Culture   Final    NO GROWTH < 12 HOURS Performed at [facility], [place] [address], [place], [place] [address]    Report Status PENDING  Incomplete  MRSA PCR Screening     Status: None   Collection Time: 02/14/20  3:41 AM   Specimen: Nasal Mucosa; Nasopharyngeal  Result Value Ref Range Status   MRSA by PCR NEGATIVE NEGATIVE Final    Comment:        The GeneXpert MRSA Assay (FDA approved for NASAL specimens only), is one component of a comprehensive MRSA colonization surveillance program. It is not intended to diagnose MRSA infection nor to guide or monitor treatment for MRSA infections. Performed at [facility], [place] [address] [place], [place] [address]          Radiology Studies: CT HEAD WO CONTRAST  Result Date: 02/13/2020 CLINICAL DATA:  Delirium. EXAM: CT HEAD  WITHOUT CONTRAST TECHNIQUE: Contiguous axial images were obtained from the base of the skull through the vertex without intravenous contrast. COMPARISON:  CT head 05/29/2019 FINDINGS: Brain: Motion degraded study Generalized atrophy. Extensive chronic microvascular ischemic change in the white matter. Chronic infarct left thalamus unchanged. Negative for acute infarct, hemorrhage, mass. Vascular: Negative for hyperdense vessel Skull: Negative Sinuses/Orbits: Air-fluid level right sphenoid sinus. Remaining paranasal sinuses clear. Bilateral cataract extraction Other: None IMPRESSION: No acute abnormality. Motion degraded study Atrophy with extensive chronic microvascular ischemia. Electronically Signed   By: Franchot Gallo M.D.   On: 02/13/2020 11:51   MR BRAIN WO CONTRAST  Result Date: 02/13/2020 CLINICAL DATA:  Acute stroke suspected. EXAM: MRI HEAD WITHOUT CONTRAST TECHNIQUE: Multiplanar, multiecho pulse sequences of the brain and surrounding structures were obtained without intravenous contrast. COMPARISON:  CT head from the same day, MRI head from 01/18/2019 FINDINGS: Patient was uncooperative and therefore the exam was terminated early. T1 imaging and T2 coronal reformatted imaging was not obtained. Imaging that was obtained is limited by patient motion. Within these limitations: Brain: Linear restricted diffusion involving the left corona radiata, compatible with acute infarct. Mild associated edema in this region without substantial mass effect. Remote left thalamic lacunar infarct. No acute hemorrhage. There is extensive periventricular white matter T2/FLAIR hyperintensity, compatible with chronic microvascular ischemic disease. Generalized cerebral atrophy with stable ex vacuo ventricular dilation. No hydrocephalus. No mass lesion or abnormal mass effect. Vascular: Major proximal arterial flow voids are maintained. Skull and upper cervical spine: Limited evaluation without evidence of abnormality.  Sinuses/Orbits: Mild ethmoid air cell mucosal thickening. Air-fluid level in the right  sphenoid sinus. Remaining sinuses are clear. No acute orbital abnormality. Other: No mastoid effusion. IMPRESSION: Incomplete and limited examination as detailed above. Within this limitation: 1. Acute infarct involving the left corona radiata. No substantial mass effect. 2. Similar extensive chronic microvascular ischemic change and generalized cerebral atrophy. Electronically Signed   By: Margaretha Sheffield MD   On: 02/13/2020 15:40   DG Chest Portable 1 View  Result Date: 02/13/2020 CLINICAL DATA:  Altered mental status EXAM: PORTABLE CHEST 1 VIEW COMPARISON:  12/25/2017 FINDINGS: Calcified atheromatous plaque of the thoracic aorta. Similar to prior study. Cardiomediastinal contours and hilar structures with similar appearance though increased density in the RIGHT infrahilar region partially obscured RIGHT hemidiaphragm. Graded opacity in this area suggests concomitant effusion. On limited assessment skeletal structures without acute process. Severe LEFT glenohumeral degenerative changes and signs of RIGHT shoulder arthroplasty incompletely assessed. IMPRESSION: Increasing density in the RIGHT infrahilar region with concomitant RIGHT effusion. This may represent pneumonia with concomitant effusion aspiration could also have this distribution, correlate with any risk factors for aspiration. Suggest follow-up to ensure resolution and exclude underlying lesion in this location. Electronically Signed   By: Zetta Bills M.D.   On: 02/13/2020 10:34   DG Swallowing Func-Speech Pathology  Result Date: 02/14/2020 Objective Swallowing Evaluation: Type of Study: MBS-Modified Barium Swallow Study  Patient Details Name: ABBYGAYLE HELFAND MRN: [id number] Date of Birth: 1930-08-13 Today's Date: 02/14/2020 Time: SLP Start Time (ACUTE ONLY): 1134 -SLP Stop Time (ACUTE ONLY): 1155 SLP Time Calculation (min) (ACUTE ONLY): 21 min Past Medical  History: Past Medical History: Diagnosis Date . Adenomatous colon polyp 2007 . Arthritis  . Bilateral carotid bruits 07/27/2018 . CAD (coronary artery disease)  . Chronic back pain  . Gall stone pancreatitis  . Heart murmur  . High cholesterol  . Hx: UTI (urinary tract infection)   Now infrequent . Hypertension  . Left knee pain Nov. 18, 2014 . Onychomycosis  . Osteoarthritis  . Osteopenia  . PAD (peripheral artery disease) ([place])  . PVD (peripheral vascular disease) ([place])  . Skin ulcer of left heel ([place])  . Trigger finger, right  . Vertigo  Past Surgical History: Past Surgical History: Procedure Laterality Date . ABDOMINAL HYSTERECTOMY    partial . CHOLECYSTECTOMY  10/30/2011  Procedure: LAPAROSCOPIC CHOLECYSTECTOMY WITH INTRAOPERATIVE CHOLANGIOGRAM;  Surgeon: Zenovia Jarred, MD;  Location: [facility];  Service: General;  Laterality: N/A; . ENDARTERECTOMY FEMORAL Left 09/27/2018  Procedure: left Endarterectomy Femoral;  Surgeon: Angelia Mould, MD;  Location: [facility];  Service: Vascular;  Laterality: Left; . EUS  11/11/2011  Procedure: ESOPHAGEAL ENDOSCOPIC ULTRASOUND (EUS) RADIAL;  Surgeon: Arta Silence, MD;  Location: [facility];  Service: Endoscopy;  Laterality: N/A;  possible ERCP . EYE SURGERY   . FEMORAL-POPLITEAL BYPASS GRAFT Left 09/27/2018  Procedure: left FEMORAL THROMBECTOMY;  Surgeon: Angelia Mould, MD;  Location: [facility];  Service: Vascular;  Laterality: Left; . JOINT REPLACEMENT Left 06-22-06  Hip . JOINT REPLACEMENT Right 11-26-06  Shoulder . JOINT REPLACEMENT Right 02-27-06  Knee . LOWER EXTREMITY ANGIOGRAPHY N/A 09/27/2018  Procedure: LOWER EXTREMITY ANGIOGRAPHY;  Surgeon: Nigel Mormon, MD;  Location: [facility];  Service: Cardiovascular;  Laterality: N/A; . PATCH ANGIOPLASTY Left 09/27/2018  Procedure: Patch Angioplasty of left femoral artery using xenosure bovine pericardium patch;  Surgeon: Angelia Mould, MD;  Location: [facility];  Service: Vascular;  Laterality: Left; .  PERIPHERAL VASCULAR INTERVENTION  09/27/2018  Procedure: PERIPHERAL VASCULAR INTERVENTION;  Surgeon: Nigel Mormon, MD;  Location: [facility];  Service: Cardiovascular;; . TOTAL HIP ARTHROPLASTY   . TOTAL KNEE ARTHROPLASTY   . TOTAL SHOULDER ARTHROPLASTY   HPI: AVEREIGH SPAINHOWER is a 86 y.o. female with medical history significant of hypertension, hyperlipidemia, spinal stenosis, PAD status post angioplasty to left SFA on 09/27/2018, dementia, CVA, arthritis presents to emergency department for evaluation of altered mental status. Pt has mild demntia at baseline, but typically oriented x3 MRI 9/28: "Acute infarct involving the left corona radiata. No substantial mass effect."  CXR 9/28: "Increasing density in the RIGHT infrahilar region with concomitant RIGHT effusion. This may represent pneumonia with concomitanteffusion aspiration could also have this distribution, correlatewith any risk factors for aspiration."  Subjective: Pt awake, alert, confused Assessment / Plan / Recommendation CHL IP CLINICAL IMPRESSIONS 02/14/2020 Clinical Impression Pt has a significant oropharyngeal dysphagia with suspected neurological and structural components with likely more chronic impact. Pt appears to have cervical osteophytes that narrow her pharyngeal space, reducing her ability to deflect her epiglottis and leaving her laryngeal vestibule open to penetration and aspiration during the swallow. She also has reduced base of tongue retraction and limited UES opening with moderate pharyngeal residue. Thin and nectar thick liquids have a harder time clearing through the UES, with residue largely remaining in the pyriform sinuses and entering the airway after the swallow as well. Residue from honey thick liquids and purees is better contained in her valleculae, filling this space completely but also giving her more time to perform dry swallow over time (does not do them to command) with good airway protection as it works  on clearing. From an oral standpoint, pt also has weak lingual manipulation, limited bolus cohesion, and prolonged posterior transit with premature spillage that occurs. Of note, after the study when pt was partially reclined for transfer back to bed, she had regurgitation initially of what appeared to be secretions but then also a small amount of barium. Suspect that this came from her esophagus as her pharynx was almost completely clear as fluoroscopy ended. Pt said "yes" that she does get reflux sometimes (unclear accuracy as not a listed dx). Recommend starting Dys 1 (puree) diet and honey thick liquids, maintaining an upright position for at least 30 minutes after intake. Given significantly modified diet and concern for more chronic elements, would consider also talking to the pt/family about overall GOC.  SLP Visit Diagnosis Dysphagia, oropharyngeal phase (R13.12) Attention and concentration deficit following -- Frontal lobe and executive function deficit following -- Impact on safety and function Moderate aspiration risk   CHL IP TREATMENT RECOMMENDATION 02/14/2020 Treatment Recommendations Therapy as outlined in treatment plan below   Prognosis 02/14/2020 Prognosis for Safe Diet Advancement Fair Barriers to Reach Goals Cognitive deficits;Language deficits Barriers/Prognosis Comment -- CHL IP DIET RECOMMENDATION 02/14/2020 SLP Diet Recommendations Dysphagia 1 (Puree) solids;Honey thick liquids Liquid Administration via Cup;Straw Medication Administration Crushed with puree Compensations Slow rate;Small sips/bites Postural Changes Remain semi-upright after after feeds/meals (Comment);Seated upright at 90 degrees   CHL IP OTHER RECOMMENDATIONS 02/14/2020 Recommended Consults -- Oral Care Recommendations Oral care BID Other Recommendations Order thickener from pharmacy;Prohibited food (jello, ice cream, thin soups);Remove water pitcher   CHL IP FOLLOW UP RECOMMENDATIONS 02/14/2020 Follow up Recommendations Skilled  Nursing facility   [facility] IP FREQUENCY AND DURATION 02/14/2020 Speech Therapy Frequency (ACUTE ONLY) min 2x/week Treatment Duration 2 weeks      CHL IP ORAL PHASE 02/14/2020 Oral Phase Impaired Oral - Pudding Teaspoon -- Oral - Pudding Cup -- Oral -  Honey Teaspoon -- Oral - Honey Cup Weak lingual manipulation;Lingual pumping;Reduced posterior propulsion;Lingual/palatal residue;Delayed oral transit;Decreased bolus cohesion;Premature spillage Oral - Nectar Teaspoon -- Oral - Nectar Cup -- Oral - Nectar Straw Weak lingual manipulation;Lingual pumping;Reduced posterior propulsion;Lingual/palatal residue;Delayed oral transit;Decreased bolus cohesion;Premature spillage Oral - Thin Teaspoon -- Oral - Thin Cup Weak lingual manipulation;Lingual pumping;Reduced posterior propulsion;Lingual/palatal residue;Delayed oral transit;Decreased bolus cohesion;Premature spillage Oral - Thin Straw Weak lingual manipulation;Lingual pumping;Reduced posterior propulsion;Lingual/palatal residue;Delayed oral transit;Decreased bolus cohesion;Premature spillage Oral - Puree Weak lingual manipulation;Lingual pumping;Reduced posterior propulsion;Lingual/palatal residue;Delayed oral transit;Decreased bolus cohesion;Premature spillage Oral - Mech Soft -- Oral - Regular -- Oral - Multi-Consistency -- Oral - Pill -- Oral Phase - Comment --  CHL IP PHARYNGEAL PHASE 02/14/2020 Pharyngeal Phase Impaired Pharyngeal- Pudding Teaspoon -- Pharyngeal -- Pharyngeal- Pudding Cup -- Pharyngeal -- Pharyngeal- Honey Teaspoon -- Pharyngeal -- Pharyngeal- Honey Cup Reduced epiglottic inversion;Reduced airway/laryngeal closure;Reduced tongue base retraction;Pharyngeal residue - valleculae;Pharyngeal residue - pyriform Pharyngeal -- Pharyngeal- Nectar Teaspoon -- Pharyngeal -- Pharyngeal- Nectar Cup -- Pharyngeal -- Pharyngeal- Nectar Straw Reduced epiglottic inversion;Reduced airway/laryngeal closure;Reduced tongue base retraction;Pharyngeal residue -  valleculae;Pharyngeal residue - pyriform;Penetration/Aspiration during swallow;Penetration/Apiration after swallow Pharyngeal Material enters airway, passes BELOW cords and not ejected out despite cough attempt by patient Pharyngeal- Thin Teaspoon -- Pharyngeal -- Pharyngeal- Thin Cup Reduced epiglottic inversion;Reduced airway/laryngeal closure;Reduced tongue base retraction;Pharyngeal residue - valleculae;Pharyngeal residue - pyriform;Penetration/Aspiration during swallow;Penetration/Apiration after swallow Pharyngeal Material enters airway, passes BELOW cords and not ejected out despite cough attempt by patient Pharyngeal- Thin Straw Reduced epiglottic inversion;Reduced airway/laryngeal closure;Reduced tongue base retraction;Pharyngeal residue - valleculae;Pharyngeal residue - pyriform;Penetration/Aspiration during swallow;Penetration/Apiration after swallow Pharyngeal Material enters airway, passes BELOW cords and not ejected out despite cough attempt by patient Pharyngeal- Puree Reduced epiglottic inversion;Reduced airway/laryngeal closure;Reduced tongue base retraction;Pharyngeal residue - valleculae;Pharyngeal residue - pyriform Pharyngeal -- Pharyngeal- Mechanical Soft -- Pharyngeal -- Pharyngeal- Regular -- Pharyngeal -- Pharyngeal- Multi-consistency -- Pharyngeal -- Pharyngeal- Pill -- Pharyngeal -- Pharyngeal Comment --  CHL IP CERVICAL ESOPHAGEAL PHASE 02/14/2020 Cervical Esophageal Phase Impaired Pudding Teaspoon -- Pudding Cup -- Honey Teaspoon -- Honey Cup Reduced cricopharyngeal relaxation Nectar Teaspoon -- Nectar Cup -- Nectar Straw Reduced cricopharyngeal relaxation Thin Teaspoon -- Thin Cup Reduced cricopharyngeal relaxation Thin Straw Reduced cricopharyngeal relaxation Puree Reduced cricopharyngeal relaxation Mechanical Soft -- Regular -- Multi-consistency -- Pill -- Cervical Esophageal Comment -- Osie Bond., M.A. CCC-SLP Acute Rehabilitation Services Pager [phone] Office [phone]  02/14/2020, 2:16 PM                   Scheduled Meds: . Chlorhexidine Gluconate Cloth  6 each Topical Q0600  . enoxaparin (LOVENOX) injection  40 mg Subcutaneous Q24H  . LORazepam  0.5 mg Intravenous Once  . polyethylene glycol  17 g Oral Daily   Continuous Infusions: . ampicillin-sulbactam (UNASYN) IV 3 g (02/14/20 1635)     LOS: 1 day    Time spent: 25 minutes    Edwin Dada, MD [facility] 02/14/2020, 5:14 PM     Please page though [place] or Epic secure chat:  For [employer], Adult nurse

## 2020-02-15 ENCOUNTER — Inpatient Hospital Stay (HOSPITAL_COMMUNITY): Payer: Medicare Other

## 2020-02-15 DIAGNOSIS — I639 Cerebral infarction, unspecified: Secondary | ICD-10-CM

## 2020-02-15 DIAGNOSIS — R410 Disorientation, unspecified: Secondary | ICD-10-CM

## 2020-02-15 DIAGNOSIS — I10 Essential (primary) hypertension: Secondary | ICD-10-CM | POA: Diagnosis not present

## 2020-02-15 DIAGNOSIS — Z959 Presence of cardiac and vascular implant and graft, unspecified: Secondary | ICD-10-CM | POA: Diagnosis not present

## 2020-02-15 DIAGNOSIS — E440 Moderate protein-calorie malnutrition: Secondary | ICD-10-CM | POA: Diagnosis not present

## 2020-02-15 DIAGNOSIS — Z8673 Personal history of transient ischemic attack (TIA), and cerebral infarction without residual deficits: Secondary | ICD-10-CM | POA: Diagnosis not present

## 2020-02-15 DIAGNOSIS — E785 Hyperlipidemia, unspecified: Secondary | ICD-10-CM | POA: Diagnosis not present

## 2020-02-15 DIAGNOSIS — I6602 Occlusion and stenosis of left middle cerebral artery: Secondary | ICD-10-CM | POA: Diagnosis not present

## 2020-02-15 DIAGNOSIS — I6621 Occlusion and stenosis of right posterior cerebral artery: Secondary | ICD-10-CM | POA: Diagnosis not present

## 2020-02-15 DIAGNOSIS — M8580 Other specified disorders of bone density and structure, unspecified site: Secondary | ICD-10-CM | POA: Diagnosis not present

## 2020-02-15 DIAGNOSIS — I69351 Hemiplegia and hemiparesis following cerebral infarction affecting right dominant side: Secondary | ICD-10-CM | POA: Diagnosis not present

## 2020-02-15 DIAGNOSIS — I739 Peripheral vascular disease, unspecified: Secondary | ICD-10-CM | POA: Diagnosis not present

## 2020-02-15 DIAGNOSIS — F331 Major depressive disorder, recurrent, moderate: Secondary | ICD-10-CM | POA: Diagnosis not present

## 2020-02-15 DIAGNOSIS — N183 Chronic kidney disease, stage 3 unspecified: Secondary | ICD-10-CM | POA: Diagnosis not present

## 2020-02-15 DIAGNOSIS — R29818 Other symptoms and signs involving the nervous system: Secondary | ICD-10-CM | POA: Diagnosis not present

## 2020-02-15 DIAGNOSIS — M1990 Unspecified osteoarthritis, unspecified site: Secondary | ICD-10-CM | POA: Diagnosis not present

## 2020-02-15 DIAGNOSIS — F32A Depression, unspecified: Secondary | ICD-10-CM | POA: Diagnosis not present

## 2020-02-15 DIAGNOSIS — F0390 Unspecified dementia without behavioral disturbance: Secondary | ICD-10-CM | POA: Diagnosis not present

## 2020-02-15 DIAGNOSIS — I350 Nonrheumatic aortic (valve) stenosis: Secondary | ICD-10-CM

## 2020-02-15 DIAGNOSIS — F418 Other specified anxiety disorders: Secondary | ICD-10-CM | POA: Diagnosis not present

## 2020-02-15 DIAGNOSIS — R1312 Dysphagia, oropharyngeal phase: Secondary | ICD-10-CM | POA: Diagnosis not present

## 2020-02-15 DIAGNOSIS — I2510 Atherosclerotic heart disease of native coronary artery without angina pectoris: Secondary | ICD-10-CM | POA: Diagnosis not present

## 2020-02-15 DIAGNOSIS — N390 Urinary tract infection, site not specified: Secondary | ICD-10-CM | POA: Diagnosis not present

## 2020-02-15 DIAGNOSIS — K219 Gastro-esophageal reflux disease without esophagitis: Secondary | ICD-10-CM | POA: Diagnosis not present

## 2020-02-15 DIAGNOSIS — I7090 Unspecified atherosclerosis: Secondary | ICD-10-CM | POA: Diagnosis not present

## 2020-02-15 DIAGNOSIS — F329 Major depressive disorder, single episode, unspecified: Secondary | ICD-10-CM | POA: Diagnosis not present

## 2020-02-15 DIAGNOSIS — I129 Hypertensive chronic kidney disease with stage 1 through stage 4 chronic kidney disease, or unspecified chronic kidney disease: Secondary | ICD-10-CM | POA: Diagnosis not present

## 2020-02-15 DIAGNOSIS — B952 Enterococcus as the cause of diseases classified elsewhere: Secondary | ICD-10-CM | POA: Diagnosis not present

## 2020-02-15 DIAGNOSIS — J189 Pneumonia, unspecified organism: Secondary | ICD-10-CM | POA: Diagnosis not present

## 2020-02-15 DIAGNOSIS — Z23 Encounter for immunization: Secondary | ICD-10-CM | POA: Diagnosis not present

## 2020-02-15 DIAGNOSIS — J690 Pneumonitis due to inhalation of food and vomit: Secondary | ICD-10-CM | POA: Diagnosis not present

## 2020-02-15 LAB — CBC
HCT: 36.4 % (ref 36.0–46.0)
Hemoglobin: 11.6 g/dL — ABNORMAL LOW (ref 12.0–15.0)
MCH: 31.4 pg (ref 26.0–34.0)
MCHC: 31.9 g/dL (ref 30.0–36.0)
MCV: 98.6 fL (ref 80.0–100.0)
Platelets: 238 10*3/uL (ref 150–400)
RBC: 3.69 MIL/uL — ABNORMAL LOW (ref 3.87–5.11)
RDW: 12.1 % (ref 11.5–15.5)
WBC: 8.6 10*3/uL (ref 4.0–10.5)
nRBC: 0 % (ref 0.0–0.2)

## 2020-02-15 LAB — ECHOCARDIOGRAM COMPLETE
AR max vel: 0.99 cm2
AV Area VTI: 1.06 cm2
AV Area mean vel: 1.01 cm2
AV Mean grad: 10 mmHg
AV Peak grad: 20.3 mmHg
Ao pk vel: 2.25 m/s
Area-P 1/2: 3.99 cm2
S' Lateral: 1.9 cm
Weight: 2380.97 oz

## 2020-02-15 LAB — COMPREHENSIVE METABOLIC PANEL
ALT: 10 U/L (ref 0–44)
AST: 14 U/L — ABNORMAL LOW (ref 15–41)
Albumin: 3 g/dL — ABNORMAL LOW (ref 3.5–5.0)
Alkaline Phosphatase: 83 U/L (ref 38–126)
Anion gap: 11 (ref 5–15)
BUN: 15 mg/dL (ref 8–23)
CO2: 29 mmol/L (ref 22–32)
Calcium: 8.9 mg/dL (ref 8.9–10.3)
Chloride: 104 mmol/L (ref 98–111)
Creatinine, Ser: 0.78 mg/dL (ref 0.44–1.00)
GFR calc Af Amer: >60 mL/min (ref >60)
GFR calc non Af Amer: >60 mL/min (ref >60)
Glucose, Bld: 115 mg/dL — ABNORMAL HIGH (ref 70–99)
Potassium: 3.5 mmol/L (ref 3.5–5.1)
Sodium: 144 mmol/L (ref 135–145)
Total Bilirubin: 0.7 mg/dL (ref 0.3–1.2)
Total Protein: 5.5 g/dL — ABNORMAL LOW (ref 6.5–8.1)

## 2020-02-15 LAB — LIPID PANEL
Cholesterol: 138 mg/dL (ref 0–200)
HDL: 42 mg/dL (ref >40)
LDL Cholesterol: 86 mg/dL (ref 0–99)
Total CHOL/HDL Ratio: 3.3 RATIO
Triglycerides: 50 mg/dL (ref <150)
VLDL: 10 mg/dL (ref 0–40)

## 2020-02-15 LAB — HIV ANTIBODY (ROUTINE TESTING W REFLEX): HIV Screen 4th Generation wRfx: NONREACTIVE

## 2020-02-15 IMAGING — MR MR MRA HEAD W/O CM
1 series · 19 of 48 positions shown · non-contrast
Comparison: Brain MRI [DATE]. CT angiogram head/neck
[DATE].

CLINICAL DATA: Neuro deficit, acute, stroke suspected.

EXAM:
MRA HEAD WITHOUT CONTRAST
TECHNIQUE: Angiographic images of the Circle of Willis were obtained using MRA
technique without intravenous contrast.

[Series 5: 3d cow · axial · 0.5mm · 0.41mm/px · z∈[-26,+49]mm · 19 of 172 slices shown]
[im 1/172]
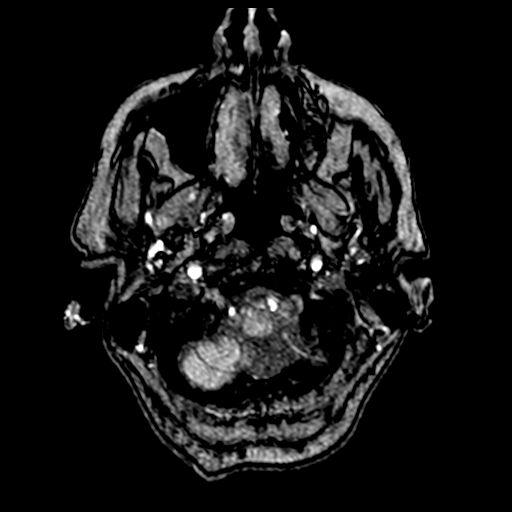
[im 4/172]
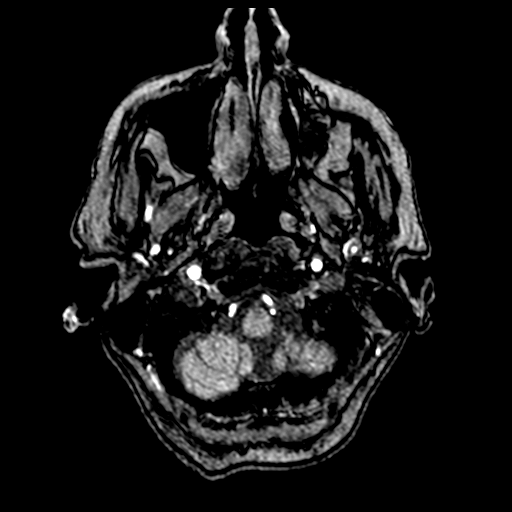
[im 8/172]
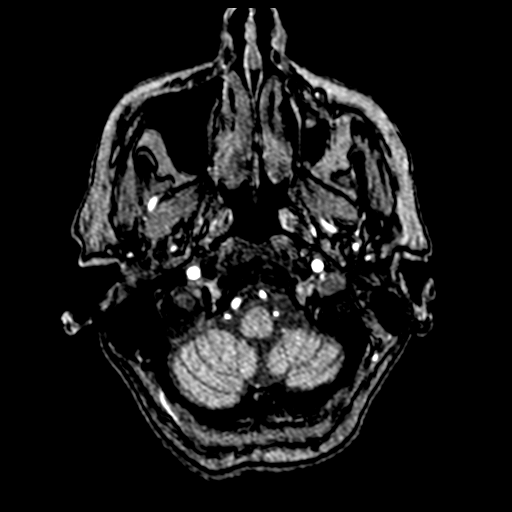
[im 11/172]
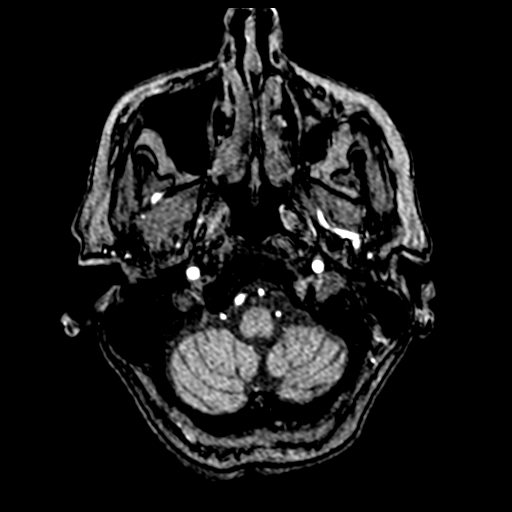
[im 15/172]
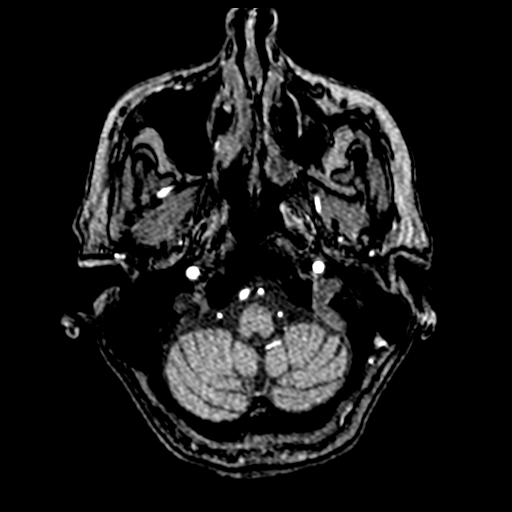
[im 19/172]
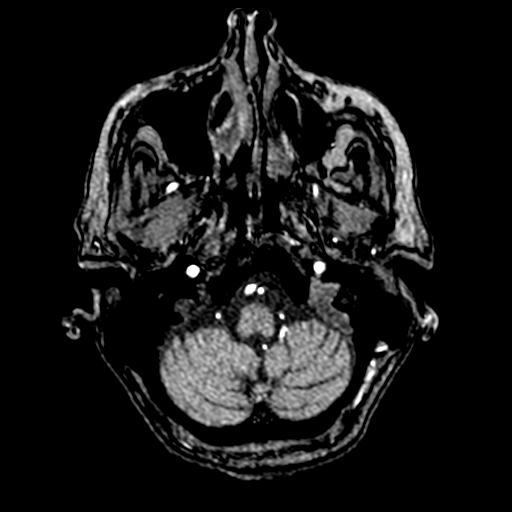
[im 22/172]
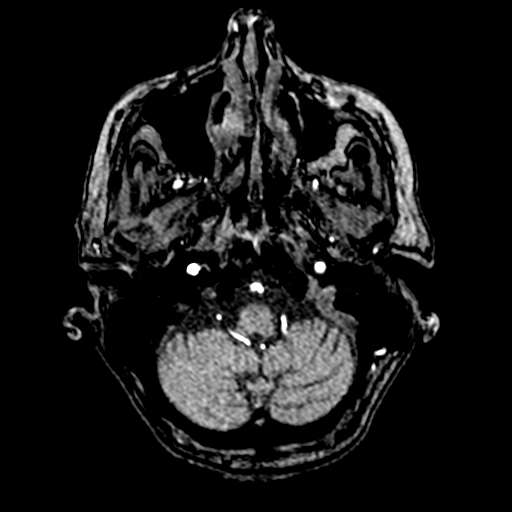
[im 26/172]
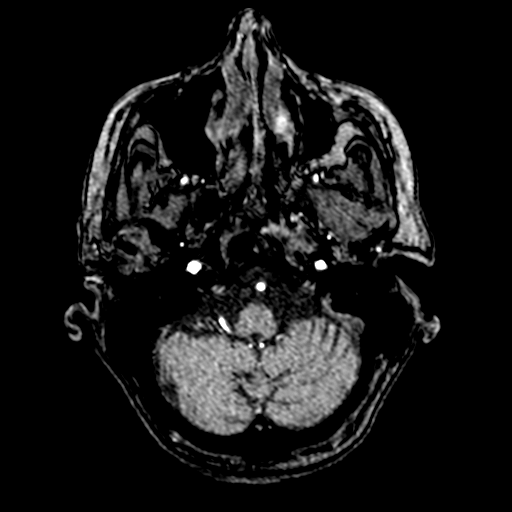
[im 30/172]
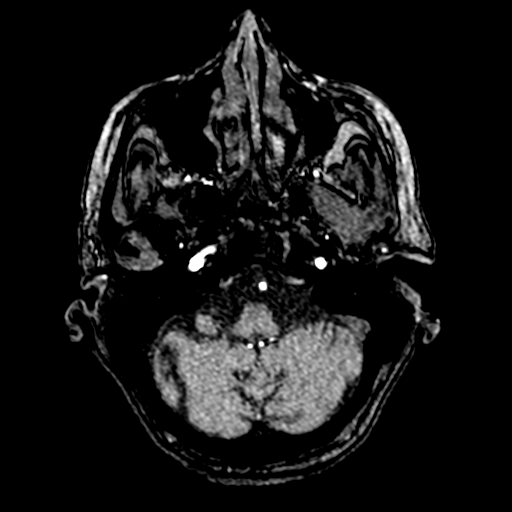
[im 33/172]
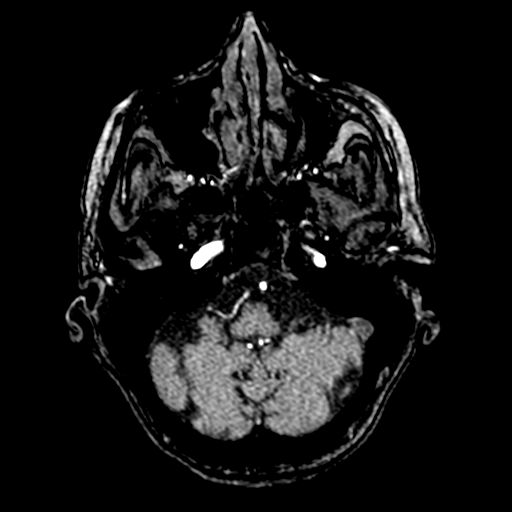
[im 37/172]
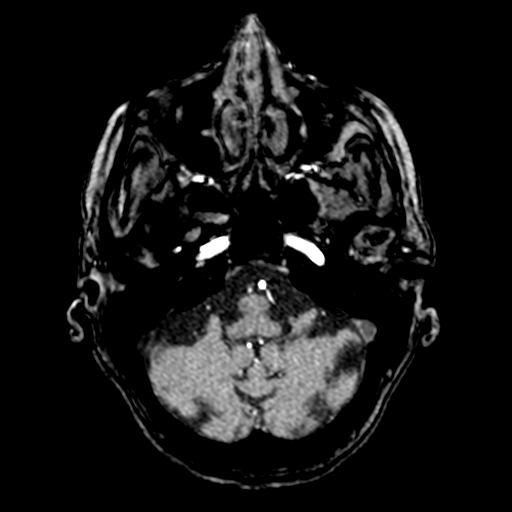
[im 55/172]
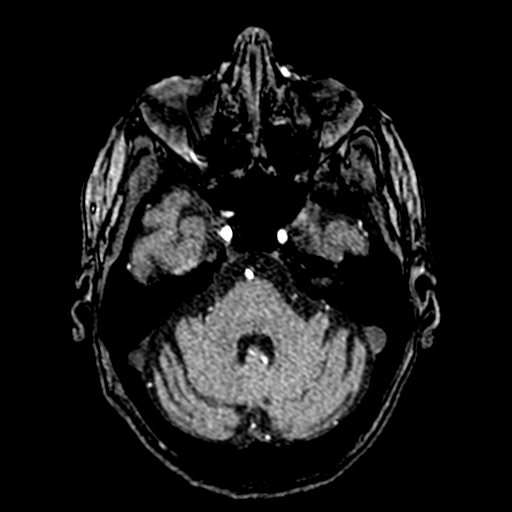
[im 77/172]
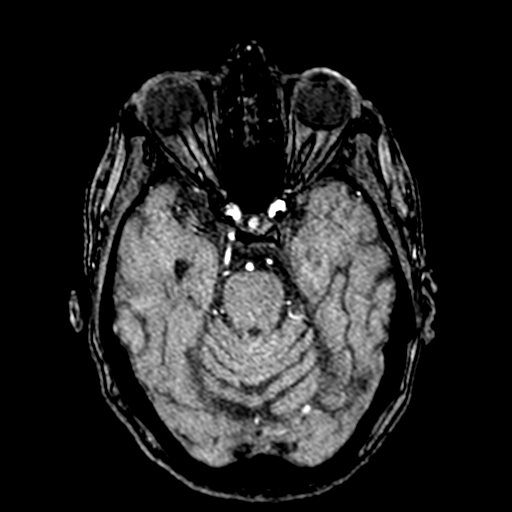
[im 88/172]
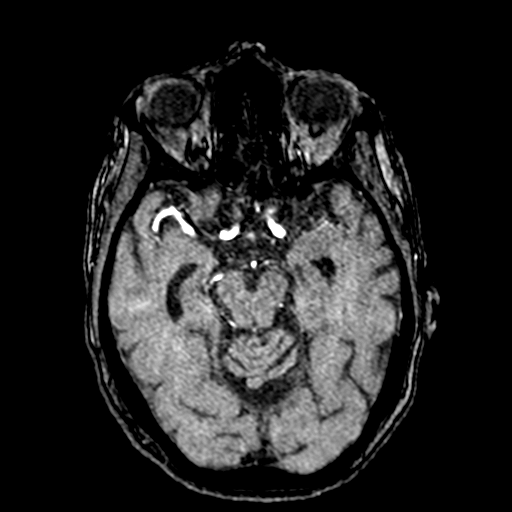
[im 99/172]
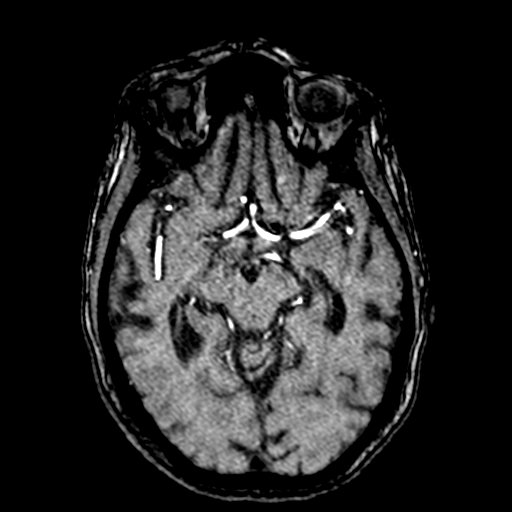
[im 121/172]
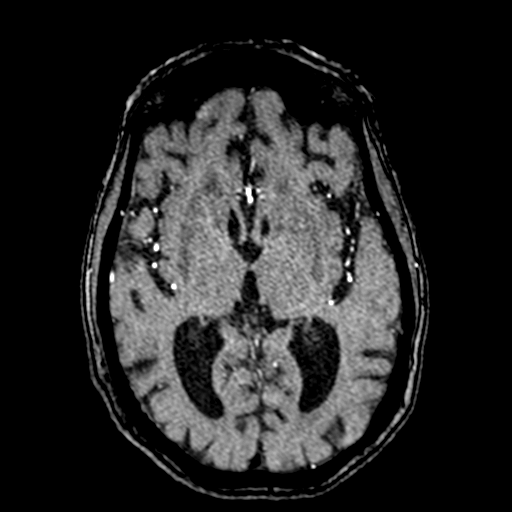
[im 142/172]
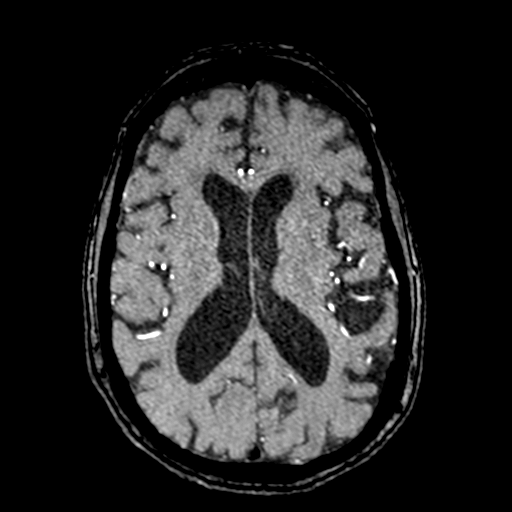
[im 146/172]
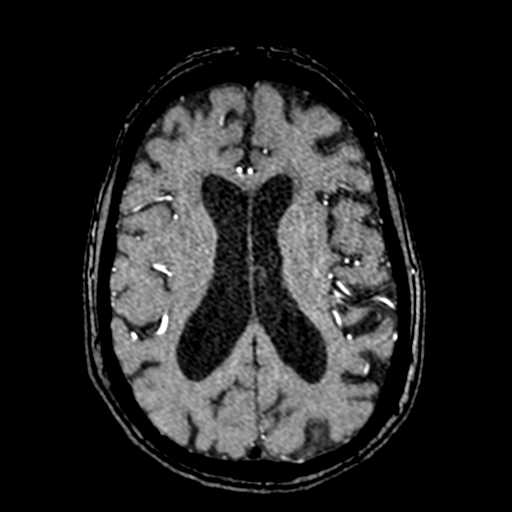
[im 164/172]
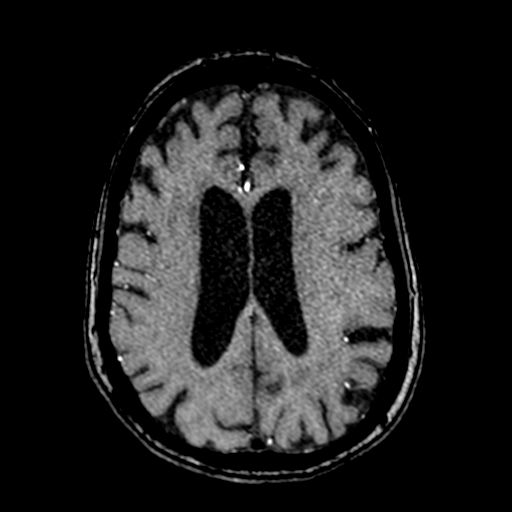

[19 of 48 positions shown; findings below may reference images not displayed]

FINDINGS: The patient is mildly motion degraded.

The intracranial internal carotid arteries are patent.
Atherosclerotic irregularity of both intracranial ICAs with no more
than mild stenosis.

The M1 middle cerebral arteries are patent without significant
stenosis. No M2 proximal branch occlusion is identified. However,
new as compared to the prior CTA of [DATE], there is a
high-grade focal stenosis within a mid M2 left MCA branch vessel
(series [XC], image 4) (series 5, image 123).

The anterior cerebral arteries are patent.

The intracranial vertebral arteries are patent.

The basilar artery is patent.

The posterior cerebral arteries are patent. Fetal origin right PCA.
New from the prior CTA, there is a mild focal stenosis within the
proximal P2 right PCA and a high-grade stenosis within the
mid/distal P2 right PCA. The left posterior communicating artery is
hypoplastic or absent.

No intracranial aneurysm is identified.
IMPRESSION: Mildly motion degraded examination.

No intracranial large vessel occlusion.

New from the prior CTA of [DATE], there is a high-grade focal
stenosis within a mid M2 left MCA branch vessel.

Also new from this prior exam, there are sites of up to
moderate/severe stenosis within the P2 right PCA.

Atherosclerotic irregularity of the intracranial ICAs with no more
than mild stenosis.

## 2020-02-15 MED ORDER — INFLUENZA VAC A&B SA ADJ QUAD 0.5 ML IM PRSY: 0.5000 mL | PREFILLED_SYRINGE | INTRAMUSCULAR | Status: DC

## 2020-02-15 MED ORDER — INFLUENZA VAC A&B SA ADJ QUAD 0.5 ML IM PRSY
0.5000 mL | PREFILLED_SYRINGE | Freq: Once | INTRAMUSCULAR | Status: AC
  Administered 2020-02-15: 0.5 mL via INTRAMUSCULAR
  Filled 2020-02-15: qty 0.5

## 2020-02-15 MED ORDER — SODIUM CHLORIDE 0.9 % IV SOLN: INTRAVENOUS | Status: DC

## 2020-02-15 MED ORDER — AMOXICILLIN-POT CLAVULANATE 875-125 MG PO TABS: 1.0000 | ORAL_TABLET | Freq: Two times a day (BID) | ORAL | Status: AC

## 2020-02-15 MED ORDER — CLOPIDOGREL BISULFATE 75 MG PO TABS: 75.0000 mg | ORAL_TABLET | Freq: Every day | ORAL | 0 refills | Status: AC

## 2020-02-15 MED ORDER — AMOXICILLIN-POT CLAVULANATE 875-125 MG PO TABS: 1.0000 | ORAL_TABLET | Freq: Two times a day (BID) | ORAL | Status: DC

## 2020-02-15 NOTE — Progress Notes (Signed)
Physical Therapy Treatment Patient Details Name: [name] MRN: [id number] DOB: [birth year] Today's Date: 02/15/2020    History of Present Illness pt is 86 y.o. female with medical history significant of hypertension, hyperlipidemia, spinal stenosis, PAD status post angioplasty to left SFA on 09/27/2018, dementia, CVA, arthritis presents to emergency department for evaluation of altered mental status. CT head negative for acute findings, Chest x-ray shows: Increased density in right infrahilar region with concomitant right effusion.? pna.  MRI brain showed lacunar infarct.    PT Comments    On arrival, pt was unable to reconcile who we were, but was agreeable to getting up OOB.  Emphasis on transition to EOB and working on scooting, sit to stand, pivotal transfer with the RW to the [facility], standing at [facility] ro peri-care and finally walking around the bed to the recliner working on safety in the AD and transfer safety.      Follow Up Recommendations  SNF     Equipment Recommendations  None recommended by PT    Recommendations for Other Services       Precautions / Restrictions Precautions Precautions: Fall Precaution Comments: incontinent, watch HR    Mobility  Bed Mobility Overal bed mobility: Needs Assistance Bed Mobility: Supine to Sit     Supine to sit: Max assist     General bed mobility comments: up and forward and scooting to EOB both.  Transfers Overall transfer level: Needs assistance Equipment used: Rolling walker (2 wheeled) Transfers: Sit to/from [facility] Sit to Stand: Mod assist Stand pivot transfers: Mod assist;+2 safety/equipment       General transfer comment: cues for hand placement, forward and boost assist.  stability assist to [facility]  Ambulation/Gait Ambulation/Gait assistance: Mod assist Gait Distance (Feet): 10 Feet Assistive device: Rolling walker (2 wheeled) Gait Pattern/deviations: Step-to pattern;Step-through pattern    Gait velocity interpretation: <1.31 ft/sec, indicative of household ambulator General Gait Details: cues and assist for posture and proximity to the RW.  Mildly unsteady overall.   Stairs             Wheelchair Mobility    Modified Rankin (Stroke Patients Only) Modified Rankin (Stroke Patients Only) Modified Rankin: Moderately severe disability     Balance Overall balance assessment: Needs assistance Sitting-balance support: Feet supported Sitting balance-Leahy Scale: Fair Sitting balance - Comments: no assist at EOB     Standing balance-Leahy Scale: Poor Standing balance comment: reliant on external support.                            Cognition Arousal/Alertness: Awake/alert Behavior During Therapy: Anxious Overall Cognitive Status: Impaired/Different from baseline                     Current Attention Level: Focused Memory: Decreased short-term memory Following Commands: Follows one step commands with increased time;Follows one step commands consistently     Problem Solving: Slow processing;Decreased initiation;Requires verbal cues;Requires tactile cues;Difficulty sequencing        Exercises      General Comments        Pertinent Vitals/Pain Pain Assessment: Faces Faces Pain Scale: No hurt Pain Intervention(s): Monitored during session    Home Living                      Prior Function            PT Goals (current goals can now be found in  the care plan section) Acute Rehab PT Goals PT Goal Formulation: With patient/family Time For Goal Achievement: 02/28/20 Potential to Achieve Goals: Fair Progress towards PT goals: Progressing toward goals    Frequency    Min 3X/week      PT Plan Current plan remains appropriate    Co-evaluation              AM-PAC PT "6 Clicks" Mobility   Outcome Measure  Help needed turning from your back to your side while in a flat bed without using bedrails?: A Lot Help  needed moving from lying on your back to sitting on the side of a flat bed without using bedrails?: A Lot Help needed moving to and from a bed to a chair (including a wheelchair)?: A Lot Help needed standing up from a chair using your arms (e.g., wheelchair or bedside chair)?: A Lot Help needed to walk in hospital room?: A Lot Help needed climbing 3-5 steps with a railing? : Total 6 Click Score: 11    End of Session   Activity Tolerance: Patient tolerated treatment well;Patient limited by fatigue Patient left: in chair;with call bell/phone within reach;with chair alarm set Nurse Communication: Mobility status PT Visit Diagnosis: Other abnormalities of gait and mobility (R26.89);Other symptoms and signs involving the nervous system (R29.898)     Time: 7517-0017 PT Time Calculation (min) (ACUTE ONLY): 26 min  Charges:  $Gait Training: 8-22 mins $Therapeutic Activity: 8-22 mins                     02/15/2020  Ginger Carne., PT Acute Rehabilitation Services [phone]  (pager) [phone]  (office)   Tessie Fass Shelvie Salsberry 02/15/2020, 5:25 PM

## 2020-02-15 NOTE — Plan of Care (Signed)

## 2020-02-15 NOTE — Progress Notes (Signed)
STROKE TEAM PROGRESS NOTE   HISTORY OF PRESENT ILLNESS (per record) [name] is a 86 y.o. female with PMH significant for dementia, HTN, PAD, orthostatic hypotension, TIA  who presents with AMS x 2-3 days. Workup with MRI Brain with advanced microvascular disease and a left corona radiata infarct. Also being treated for a R lower lobe pneumonia with parapneumonic effusions. She is pleasant and confused on my evaluation. Denies any arm or leg weakness, no numbness, no dysarthria, no facial droop. She is not sure why she is at the hospital. Feels like she should not be here. Does have a mild cough. Denies any pain. NIHSS: 1 for unable to state month correctly Premorbid mRS: 3 TPA: unclear LKW, low NIHSS. Thrombectomy: No, low NIHSS.   INTERVAL HISTORY Her daughter is at the bedside.  I personally reviewed history of present illness, electronic medical records and available imaging films in PACS.  She presented with altered mental status for 2 3 days and is being treated for pneumonia.  MRI shows tiny left corona radiata infarct possibly incidental likely from small vessel disease.  Hemoglobin A1c is 5.0.  Echocardiogram, carotid Dopplers and LDL are pending.  MR angiogram shows new focal left M2 branch vessel stenosis compared to CTA from 01/17/2019 but this is not explanation with the patient stroke is mild atherosclerotic irregularities of intracranial vessels.   OBJECTIVE Vitals:   02/14/20 1911 02/14/20 2302 02/15/20 0308 02/15/20 0747  BP: (!) 147/62 (!) 153/64 (!) 144/61 (!) 171/85  Pulse: 91 90 89 97  Resp: 18 18 18 18   Temp: 97.7 F (36.5 C) 98 F (36.7 C) 98.3 F (36.8 C) 98.1 F (36.7 C)  TempSrc: Oral Oral Oral Oral  SpO2: 93% 92% 93% 95%  Weight:   67.5 kg     CBC:  Recent Labs  Lab 02/13/20 0941 02/13/20 1011 02/14/20 0632 02/15/20 0736  WBC 8.0   < > 7.8 8.6  NEUTROABS 5.1  --   --   --   HGB 12.6   < > 12.2 11.6*  HCT 39.5   < > 37.5 36.4  MCV 99.0   < >  98.4 98.6  PLT 272   < > 256 238   < > = values in this interval not displayed.    Basic Metabolic Panel:  Recent Labs  Lab 02/13/20 1011 02/13/20 1259 02/14/20 0632 02/15/20 0736  NA   < >  --  140 144  K   < >  --  3.0* 3.5  CL   < >  --  103 104  CO2   < >  --  28 29  GLUCOSE   < >  --  92 115*  BUN   < >  --  6* 15  CREATININE   < > 0.83 0.75 0.78  CALCIUM   < >  --  8.6* 8.9  MG  --  1.6* 1.9  --   PHOS  --  3.1  --   --    < > = values in this interval not displayed.    Lipid Panel:     Component Value Date/Time   CHOL 138 02/15/2020 0736   TRIG 50 02/15/2020 0736   HDL 42 02/15/2020 0736   CHOLHDL 3.3 02/15/2020 0736   VLDL 10 02/15/2020 0736   LDLCALC 86 02/15/2020 0736   HgbA1c:  Lab Results  Component Value Date   HGBA1C 5.0 02/14/2020   Urine Drug Screen: No  results found for: LABOPIA, COCAINSCRNUR, LABBENZ, AMPHETMU, THCU, LABBARB  Alcohol Level No results found for: [place] 02/13/2020 IMPRESSION:  No acute abnormality. Motion degraded study Atrophy with extensive chronic microvascular ischemia.   MR ANGIO HEAD WO CONTRAST 02/15/2020 IMPRESSION:  Mildly motion degraded examination. No intracranial large vessel occlusion. New from the prior CTA of 01/17/2019, there is a high-grade focal stenosis within a mid M2 left MCA branch vessel. Also new from this prior exam, there are sites of up to moderate/severe stenosis within the P2 right PCA. Atherosclerotic irregularity of the intracranial ICAs with no more than mild stenosis.   MR BRAIN WO CONTRAST 02/13/2020 IMPRESSION:  Incomplete and limited examination as detailed above. Within this limitation:  1. Acute infarct involving the left corona radiata. No substantial mass effect.  2. Similar extensive chronic microvascular ischemic change and generalized cerebral atrophy.    DG Swallowing Func-Speech Pathology 02/14/2020 Objective Swallowing Evaluation: Type of Study:  MBS-Modified Barium Swallow Study  Patient Details Name: CHARLY HUNTON MRN: [id number] Date of Birth: Jul 13, 1930 Today's Date: 02/14/2020 Time: SLP Start Time (ACUTE ONLY): 3818 -SLP Stop Time (ACUTE ONLY): 1155 SLP Time Calculation (min) (ACUTE ONLY): 21 min Past Medical History: Past Medical History: Diagnosis Date . Adenomatous colon polyp 2007 . Arthritis  . Bilateral carotid bruits 07/27/2018 . CAD (coronary artery disease)  . Chronic back pain  . Gall stone pancreatitis  . Heart murmur  . High cholesterol  . Hx: UTI (urinary tract infection)   Now infrequent . Hypertension  . Left knee pain Nov. 18, 2014 . Onychomycosis  . Osteoarthritis  . Osteopenia  . PAD (peripheral artery disease) ([place])  . PVD (peripheral vascular disease) ([place])  . Skin ulcer of left heel ([place])  . Trigger finger, right  . Vertigo  Past Surgical History: Past Surgical History: Procedure Laterality Date . ABDOMINAL HYSTERECTOMY    partial . CHOLECYSTECTOMY  10/30/2011  Procedure: LAPAROSCOPIC CHOLECYSTECTOMY WITH INTRAOPERATIVE CHOLANGIOGRAM;  Surgeon: Zenovia Jarred, MD;  Location: [place];  Service: General;  Laterality: N/A; . ENDARTERECTOMY FEMORAL Left 09/27/2018  Procedure: left Endarterectomy Femoral;  Surgeon: Angelia Mould, MD;  Location: [facility];  Service: Vascular;  Laterality: Left; . EUS  11/11/2011  Procedure: ESOPHAGEAL ENDOSCOPIC ULTRASOUND (EUS) RADIAL;  Surgeon: Arta Silence, MD;  Location: [facility];  Service: Endoscopy;  Laterality: N/A;  possible ERCP . EYE SURGERY   . FEMORAL-POPLITEAL BYPASS GRAFT Left 09/27/2018  Procedure: left FEMORAL THROMBECTOMY;  Surgeon: Angelia Mould, MD;  Location: [place];  Service: Vascular;  Laterality: Left; . JOINT REPLACEMENT Left 06-22-06  Hip . JOINT REPLACEMENT Right 11-26-06  Shoulder . JOINT REPLACEMENT Right 02-27-06  Knee . LOWER EXTREMITY ANGIOGRAPHY N/A 09/27/2018  Procedure: LOWER EXTREMITY ANGIOGRAPHY;  Surgeon: Nigel Mormon, MD;  Location: [facility];  Service: Cardiovascular;  Laterality: N/A; . PATCH ANGIOPLASTY Left 09/27/2018  Procedure: Patch Angioplasty of left femoral artery using xenosure bovine pericardium patch;  Surgeon: Angelia Mould, MD;  Location: [facility];  Service: Vascular;  Laterality: Left; . PERIPHERAL VASCULAR INTERVENTION  09/27/2018  Procedure: PERIPHERAL VASCULAR INTERVENTION;  Surgeon: Nigel Mormon, MD;  Location: [facility];  Service: Cardiovascular;; . TOTAL HIP ARTHROPLASTY   . TOTAL KNEE ARTHROPLASTY   . TOTAL SHOULDER ARTHROPLASTY   HPI: CARROLYN HILMES is a 86 y.o. female with medical history significant of hypertension, hyperlipidemia, spinal stenosis, PAD status post angioplasty to left SFA on 09/27/2018,  dementia, CVA, arthritis presents to emergency department for evaluation of altered mental status. Pt has mild demntia at baseline, but typically oriented x3 MRI 9/28: "Acute infarct involving the left corona radiata. No substantial mass effect."  CXR 9/28: "Increasing density in the RIGHT infrahilar region with concomitant RIGHT effusion. This may represent pneumonia with concomitanteffusion aspiration could also have this distribution, correlatewith any risk factors for aspiration."  Subjective: Pt awake, alert, confused Assessment / Plan / Recommendation CHL IP CLINICAL IMPRESSIONS 02/14/2020 Clinical Impression Pt has a significant oropharyngeal dysphagia with suspected neurological and structural components with likely more chronic impact. Pt appears to have cervical osteophytes that narrow her pharyngeal space, reducing her ability to deflect her epiglottis and leaving her laryngeal vestibule open to penetration and aspiration during the swallow. She also has reduced base of tongue retraction and limited UES opening with moderate pharyngeal residue. Thin and nectar thick liquids have a harder time clearing through the UES, with residue largely remaining in the pyriform sinuses and entering the airway  after the swallow as well. Residue from honey thick liquids and purees is better contained in her valleculae, filling this space completely but also giving her more time to perform dry swallow over time (does not do them to command) with good airway protection as it works on clearing. From an oral standpoint, pt also has weak lingual manipulation, limited bolus cohesion, and prolonged posterior transit with premature spillage that occurs. Of note, after the study when pt was partially reclined for transfer back to bed, she had regurgitation initially of what appeared to be secretions but then also a small amount of barium. Suspect that this came from her esophagus as her pharynx was almost completely clear as fluoroscopy ended. Pt said "yes" that she does get reflux sometimes (unclear accuracy as not a listed dx). Recommend starting Dys 1 (puree) diet and honey thick liquids, maintaining an upright position for at least 30 minutes after intake. Given significantly modified diet and concern for more chronic elements, would consider also talking to the pt/family about overall GOC.  SLP Visit Diagnosis Dysphagia, oropharyngeal phase (R13.12) Attention and concentration deficit following -- Frontal lobe and executive function deficit following -- Impact on safety and function Moderate aspiration risk   CHL IP TREATMENT RECOMMENDATION 02/14/2020 Treatment Recommendations Therapy as outlined in treatment plan below   Prognosis 02/14/2020 Prognosis for Safe Diet Advancement Fair Barriers to Reach Goals Cognitive deficits;Language deficits Barriers/Prognosis Comment -- CHL IP DIET RECOMMENDATION 02/14/2020 SLP Diet Recommendations Dysphagia 1 (Puree) solids;Honey thick liquids Liquid Administration via Cup;Straw Medication Administration Crushed with puree Compensations Slow rate;Small sips/bites Postural Changes Remain semi-upright after after feeds/meals (Comment);Seated upright at 90 degrees   CHL IP OTHER RECOMMENDATIONS  02/14/2020 Recommended Consults -- Oral Care Recommendations Oral care BID Other Recommendations Order thickener from pharmacy;Prohibited food (jello, ice cream, thin soups);Remove water pitcher   CHL IP FOLLOW UP RECOMMENDATIONS 02/14/2020 Follow up Recommendations Skilled Nursing facility   [facility] IP FREQUENCY AND DURATION 02/14/2020 Speech Therapy Frequency (ACUTE ONLY) min 2x/week Treatment Duration 2 weeks      CHL IP ORAL PHASE 02/14/2020 Oral Phase Impaired Oral - Pudding Teaspoon -- Oral - Pudding Cup -- Oral - Honey Teaspoon -- Oral - Honey Cup Weak lingual manipulation;Lingual pumping;Reduced posterior propulsion;Lingual/palatal residue;Delayed oral transit;Decreased bolus cohesion;Premature spillage Oral - Nectar Teaspoon -- Oral - Nectar Cup -- Oral - Nectar Straw Weak lingual manipulation;Lingual pumping;Reduced posterior propulsion;Lingual/palatal residue;Delayed oral transit;Decreased bolus cohesion;Premature spillage Oral - Thin Teaspoon -- Oral -  Thin Cup Weak lingual manipulation;Lingual pumping;Reduced posterior propulsion;Lingual/palatal residue;Delayed oral transit;Decreased bolus cohesion;Premature spillage Oral - Thin Straw Weak lingual manipulation;Lingual pumping;Reduced posterior propulsion;Lingual/palatal residue;Delayed oral transit;Decreased bolus cohesion;Premature spillage Oral - Puree Weak lingual manipulation;Lingual pumping;Reduced posterior propulsion;Lingual/palatal residue;Delayed oral transit;Decreased bolus cohesion;Premature spillage Oral - Mech Soft -- Oral - Regular -- Oral - Multi-Consistency -- Oral - Pill -- Oral Phase - Comment --  CHL IP PHARYNGEAL PHASE 02/14/2020 Pharyngeal Phase Impaired Pharyngeal- Pudding Teaspoon -- Pharyngeal -- Pharyngeal- Pudding Cup -- Pharyngeal -- Pharyngeal- Honey Teaspoon -- Pharyngeal -- Pharyngeal- Honey Cup Reduced epiglottic inversion;Reduced airway/laryngeal closure;Reduced tongue base retraction;Pharyngeal residue - valleculae;Pharyngeal  residue - pyriform Pharyngeal -- Pharyngeal- Nectar Teaspoon -- Pharyngeal -- Pharyngeal- Nectar Cup -- Pharyngeal -- Pharyngeal- Nectar Straw Reduced epiglottic inversion;Reduced airway/laryngeal closure;Reduced tongue base retraction;Pharyngeal residue - valleculae;Pharyngeal residue - pyriform;Penetration/Aspiration during swallow;Penetration/Apiration after swallow Pharyngeal Material enters airway, passes BELOW cords and not ejected out despite cough attempt by patient Pharyngeal- Thin Teaspoon -- Pharyngeal -- Pharyngeal- Thin Cup Reduced epiglottic inversion;Reduced airway/laryngeal closure;Reduced tongue base retraction;Pharyngeal residue - valleculae;Pharyngeal residue - pyriform;Penetration/Aspiration during swallow;Penetration/Apiration after swallow Pharyngeal Material enters airway, passes BELOW cords and not ejected out despite cough attempt by patient Pharyngeal- Thin Straw Reduced epiglottic inversion;Reduced airway/laryngeal closure;Reduced tongue base retraction;Pharyngeal residue - valleculae;Pharyngeal residue - pyriform;Penetration/Aspiration during swallow;Penetration/Apiration after swallow Pharyngeal Material enters airway, passes BELOW cords and not ejected out despite cough attempt by patient Pharyngeal- Puree Reduced epiglottic inversion;Reduced airway/laryngeal closure;Reduced tongue base retraction;Pharyngeal residue - valleculae;Pharyngeal residue - pyriform Pharyngeal -- Pharyngeal- Mechanical Soft -- Pharyngeal -- Pharyngeal- Regular -- Pharyngeal -- Pharyngeal- Multi-consistency -- Pharyngeal -- Pharyngeal- Pill -- Pharyngeal -- Pharyngeal Comment --  CHL IP CERVICAL ESOPHAGEAL PHASE 02/14/2020 Cervical Esophageal Phase Impaired Pudding Teaspoon -- Pudding Cup -- Honey Teaspoon -- Honey Cup Reduced cricopharyngeal relaxation Nectar Teaspoon -- Nectar Cup -- Nectar Straw Reduced cricopharyngeal relaxation Thin Teaspoon -- Thin Cup Reduced cricopharyngeal relaxation Thin Straw Reduced  cricopharyngeal relaxation Puree Reduced cricopharyngeal relaxation Mechanical Soft -- Regular -- Multi-consistency -- Pill -- Cervical Esophageal Comment -- Osie Bond., M.A. [place] [facility] Pager [phone] Office [phone] 02/14/2020, 2:16 PM              VAS US CAROTID 02/15/2020 Summary:  Right Carotid: Velocities in the right ICA are consistent with a 1-39% stenosis.  Left Carotid: Velocities in the left ICA are consistent with a 1-39% stenosis.  Vertebrals: Bilateral vertebral arteries demonstrate antegrade flow.  Preliminary    Transthoracic Echocardiogram  00/00/2021 Pending  ECG - SR rate 74 BPM. (See cardiology reading for complete details)  PHYSICAL EXAM Blood pressure (!) 171/85, pulse 97, temperature 98.1 F (36.7 C), temperature source Oral, resp. rate 18, weight 67.5 kg, SpO2 95 %. Frail elderly Caucasian lady not in distress. . Afebrile. Head is nontraumatic. Neck is supple without bruit.    Cardiac exam no murmur or gallop. Lungs are clear to auscultation. Distal pulses are well felt.  She is hard of hearing Neurological Exam : She is awake alert oriented to person and place only.  Diminished attention, registration and recall.  Speech is clear without dysarthria or aphasia.  Follows simple midline and one-step commands well.  Extraocular movements are full range without nystagmus.  Blinks to threat bilaterally.  Face is symmetric without weakness.  Mild diminished hearing bilaterally.  Tongue midline.  Motor system exam able to move all 4 extremities well against gravity without focal weakness.  Deep tendon fields are symmetric.  Sensation appears intact.  Gait not tested.  ASSESSMENT/PLAN Ms. HAZELLE WOOLLARD is a 86 y.o. female with history of dementia, CAD, HTN, PAD, orthostatic hypotension, and TIA presenting with AMS x 2-3 days in the setting of a R lower lobe pneumonia with parapneumonic effusions and found to have a left corona radiata  infarct.  She did not receive IV t-PA due to minimal deficits.   Stroke: left corona radiata infarct likely due to small vessel disease likely due to small vessel disease.  Resultant no focal motor deficits from stroke  Code Stroke CT Head - not ordered  CT head - No acute abnormality. Motion degraded study Atrophy with extensive chronic microvascular ischemia.   MRI head - Acute infarct involving the left corona radiata. No substantial mass effect. Similar extensive chronic microvascular ischemic change and generalized cerebral atrophy.    MRA head - high-grade focal stenosis within a mid M2 left MCA branch vessel. Sites of up to moderate/severe stenosis within the P2 right PCA.    CTA H&N - not ordered  CT Perfusion - not ordered  Carotid Doppler - unremarkable  2D Echo - pending  Sars Corona Virus 2 - negative  LDL - 86  HgbA1c - 5.0  UDS - not ordered  VTE prophylaxis - Lovenox / SCDs Diet  Diet Order            DIET - DYS 1 Room service appropriate? Yes with Assist; Fluid consistency: Honey Thick  Diet effective now                 No antithrombotic prior to admission, now on aspirin 81 mg daily  Patient counseled to be compliant with her antithrombotic medications  Ongoing aggressive stroke risk factor management  Therapy recommendations:  pending  Disposition:  Pending  Hypertension  Home BP meds: Norvasc ; irbesartan  Current BP meds: Norvasc ; irbesartan  Stable . Permissive hypertension (OK if < 220/120) but gradually normalize in 5-7 days . Long-term BP goal normotensive  Hyperlipidemia  Home Lipid lowering medication: Pravastatin 20 mg daily  LDL 86, goal < 70  Current lipid lowering medication: Pravastatin 20 mg daily (consider increasing to 40 mg daily)  Continue statin at discharge  Other Stroke Risk Factors  Advanced age  Obesity, Body mass index is 29.06 kg/m., recommend weight loss, diet and exercise as appropriate   Hx  stroke/TIA  Coronary artery disease  Other Active Problems  Code status - DNR   Hospital day # 2 She presented with altered mental status likely secondary to pneumonia with baseline dementia and MRI showing left corona radiata infarct is probably incidental.  MRA shows mild intracranial atherosclerotic changes which are likely unrelated as well.  Recommend aspirin 81 mg daily and Plavix 75 mg daily for 3 weeks followed by aspirin alone.  Continue ongoing stroke work-up.  Aggressive risk factor modification.  Mobilize out of bed.  Therapy consults.  Long discussion with patient and daughter and answered questions.  Discussed with Dr. Loleta Books.  Greater than 50% time during the 35-minute visit was spent on counseling and coordination of care about her lacunar stroke and discussion about stroke prevention, treatment and answering questions. Antony Contras, MD   To contact Stroke Continuity provider, please refer to [email or web address] After hours, contact General Neurology

## 2020-02-15 NOTE — Progress Notes (Signed)
Pt's daughter concern about BP 153/64. Dr. Tonie Griffith collaborated with on this matter d/t pt's daughter request.. MD states that this BP is good and treating it would possibly impede brain perfusion.

## 2020-02-15 NOTE — Progress Notes (Signed)
Patient to be discharged to [facility], to be transported by her daughter in a private vehicle. Called report to [place] at [place] [facility]. Discharge instructions were reviewed with the patient's daughter and placed in packet to give to the nurse at [place]. Patient has all belongings at bedside.

## 2020-02-15 NOTE — Discharge Instructions (Signed)
Community-Acquired Pneumonia, Adult Pneumonia is a type of lung infection that causes swelling in the airways of the lungs. Mucus and fluid may also build up inside the airways. This may cause coughing and difficulty breathing. There are different types of pneumonia. One type can develop while a person is in a hospital. A different type is called community-acquired pneumonia. It develops in people who are not, and have not recently been, in the hospital or another type of health care facility. What are the causes? This condition may be caused by:  Viruses. This is the most common cause of pneumonia.  Bacteria. Community-acquired pneumonia is often caused by Streptococcus pneumoniae bacteria. These bacteria are often passed from one person to another by breathing in droplets from the cough or sneeze of an infected person.  Fungi. This is the least common cause of pneumonia. What increases the risk? The following factors may make you more likely to develop this condition:  Having a chronic disease, such as chronic obstructive pulmonary disease (COPD), asthma, congestive heart failure, cystic fibrosis, diabetes, or kidney disease.  Having early-stage or late-stage HIV.  Having sickle cell disease.  Having had your spleen removed (splenectomy).  Having poor dental hygiene.  Having a medical condition that increases the risk of breathing in (aspirating) secretions from your own mouth and nose.  Having a weakened body defense system (immune system).  Being a smoker.  Traveling to areas where pneumonia-causing germs commonly exist.  Being around animal habitats or animals that have pneumonia-causing germs, including birds, bats, rabbits, cats, and farm animals. What are the signs or symptoms? Symptoms of this condition include:  A dry cough.  A wet (productive) cough.  Fever.  Sweating.  Chest pain, especially when breathing deeply or coughing.  Rapid breathing or difficulty  breathing.  Shortness of breath.  Shaking chills.  Fatigue.  Muscle aches. How is this diagnosed? This condition may be diagnosed based on:  Your medical history.  A physical exam. You may also have tests, including:  Chest X-rays.  Tests of your blood oxygen level and other blood gases.  Tests on blood, mucus (sputum), fluid around your lungs (pleural fluid), and urine. If your pneumonia is severe, other tests may be done to find the exact cause of your illness. How is this treated? Treatment for this condition depends on many factors, such as the cause of your pneumonia, the medicines you take, and other medical conditions that you have. For most adults, treatment and recovery from pneumonia may occur at home. In some cases, treatment must happen in a hospital. Treatment may include:  Medicines that are given by mouth or through an IV, including: ? Antibiotic medicines, if the pneumonia was caused by bacteria. ? Antiviral medicines, if the pneumonia was caused by a virus.  Being given extra oxygen.  Respiratory therapy. Although rare, treating severe pneumonia may include:  Using a machine to help you breathe (mechanical ventilation). This is done if you are not breathing well on your own and you cannot maintain a safe blood oxygen level.  Thoracentesis. This is a procedure to remove fluid from around one lung or both lungs to help you breathe better. Follow these instructions at home:  Medicines  Take over-the-counter and prescription medicines only as told by your health care provider. ? Only take cough medicine if you are losing sleep. Be aware that cough medicine can prevent your body's natural ability to remove mucus from your lungs.  If you were prescribed an antibiotic   medicine, take it as told by your health care provider. Do not stop taking the antibiotic even if you start to feel better. General instructions  Sleep in a semi-upright position at night. Try  sleeping in a reclining chair, or place a few pillows under your head.  Rest as needed and get at least 8 hours of sleep each night.  Drink enough water to keep your urine pale yellow. This will help to thin out mucus secretions in your lungs.  Eat a healthy diet that includes plenty of vegetables, fruits, whole grains, low-fat dairy products, and lean protein.  Do not use any products that contain nicotine or tobacco, such as cigarettes, e-cigarettes, and chewing tobacco. If you need help quitting, ask your health care provider.  Keep all follow-up visits as told by your health care provider. This is important. How is this prevented? You can lower your risk of developing community-acquired pneumonia by:  Getting a pneumococcal vaccine. There are different types and schedules of pneumococcal vaccines. Ask your health care provider which option is best for you. Consider getting the vaccine if: ? You are older than 84 years of age. ? You are older than 84 years of age and are undergoing cancer treatment, have chronic lung disease, or have other medical conditions that affect your immune system. Ask your health care provider if this applies to you.  Getting an influenza vaccine every year. Ask your health care provider which type of vaccine is best for you.  Getting regular checkups from your dentist.  Washing your hands often. If soap and water are not available, use hand sanitizer. Contact a health care provider if:  You have a fever.  You are losing sleep because you cannot control your cough with cough medicine. Get help right away if:  You have worsening shortness of breath.  You have increased chest pain.  Your sickness becomes worse, especially if you are an older adult or have a weakened immune system.  You cough up blood. Summary  Pneumonia is an infection of the lungs.  Community-acquired pneumonia develops in people who have not been in the hospital. It can be caused  by bacteria, viruses, or fungi.  This condition may be treated with antibiotics or antiviral medicines.  Severe cases may require hospitalization, mechanical ventilation, and other procedures to drain fluid from the lungs. This information is not intended to replace advice given to you by your health care provider. Make sure you discuss any questions you have with your health care provider. Document Revised: 12/30/2017 Document Reviewed: 12/30/2017 Elsevier Patient Education  2020 [employer]  

## 2020-02-15 NOTE — Progress Notes (Signed)
Carotid artery duplex has been completed. Preliminary results can be found in CV Proc through chart review.   02/15/20 9:51 AM [name] RVT

## 2020-02-15 NOTE — TOC Initial Note (Signed)
Transition of Care [facility]) - Initial/Assessment Note    Patient Details  Name: [name] MRN: [id number] Date of Birth: [birth year]  [facility] CM/SW Contact:    Geralynn Ochs, LCSW Phone Number: 02/15/2020, 4:44 PM  Clinical Narrative:     CSW confirmed with [facility] that patient was from their long term care, and that she could return when stable. CSW asked about rehab, and the patient would stay in her long term care room and receive therapy there instead. CSW met with daughter, [name], at bedside to confirm that the plan was to return to [facility] to her long term residence and receive therapy there. Patient and daughter hopeful to [facility]. CSW to follow.              Expected Discharge Plan: Long Term Nursing Home Barriers to Discharge: Continued Medical Work up   Patient Goals and CMS Choice Patient states their goals for this hospitalization and ongoing recovery are:: patient unable to participate in goal setting due to disorientation CMS Medicare.gov Compare Post Acute Care list provided to:: Patient Represenative (must comment) Choice offered to / list presented to : Adult Children  Expected Discharge Plan and Services Expected Discharge Plan: [place] Acute Care Choice: Nursing Home Living arrangements for the past 2 months: [place] Expected Discharge Date: 02/15/20                                    Prior Living Arrangements/Services Living arrangements for the past 2 months: [place] Lives with:: Facility Resident Patient language and need for interpreter reviewed:: No Do you feel safe going back to the place where you live?: Yes      Need for Family Participation in Patient Care: Yes (Comment) Care giver support system in place?: No (comment) Current home services: DME Criminal Activity/Legal Involvement Pertinent to Current Situation/Hospitalization: No -  Comment as needed  Activities of Daily Living      Permission Sought/Granted Permission sought to share information with : Facility Sport and exercise psychologist, Family Supports Permission granted to share information with : Yes, Verbal Permission Granted  Share Information with NAME: [name]  Permission granted to share info w AGENCY: [facility]  Permission granted to share info w Relationship: Daughter     Emotional Assessment   Attitude/Demeanor/Rapport: Unable to Assess Affect (typically observed): Unable to Assess Orientation: : Oriented to Self Alcohol / Substance Use: Not Applicable Psych Involvement: No (comment)  Admission diagnosis:  Delirium [R41.0] Pneumonia of right lower lobe due to infectious organism [J18.9] AMS (altered mental status) [R41.82] Patient Active Problem List   Diagnosis Date Noted  . AMS (altered mental status) 02/13/2020  . Stroke of unknown etiology ([place]) 01/17/2019  . PAD (peripheral artery disease) ([place]) 09/27/2018  . Bilateral carotid bruits 07/27/2018  . Pain in limb-Left Knee 04/04/2013  . Peripheral vascular disease, unspecified ([place]) 04/04/2013  . Choledocholithiasis with obstruction 10/30/2011  . PVD (peripheral vascular disease) ([place]) 10/29/2011  . Pancreatitis 10/28/2011  . Cholelithiasis 10/28/2011  . UTI (urinary tract infection) 10/28/2011  . Hypertension 10/28/2011  . Hypercholesterolemia 10/28/2011   PCP:  Javier Glazier, MD Pharmacy:   [place], [address] [place] [address] Phone: [phone] Fax: [place], [address] [place] [address] Phone: [phone] Fax: [phone]     Social Determinants of Health (SDOH) Interventions    Readmission Risk Interventions No flowsheet data found.

## 2020-02-15 NOTE — Progress Notes (Signed)
  Echocardiogram 2D Echocardiogram has been performed.  [name] 02/15/2020, 9:56 AM

## 2020-02-15 NOTE — Progress Notes (Signed)
  Speech Language Pathology Treatment: Dysphagia  Patient Details Name: [name] MRN: [id number] DOB: 1930/08/28 Today's Date: 02/15/2020 Time: 2585-2778 SLP Time Calculation (min) (ACUTE ONLY): 13 min  Assessment / Plan / Recommendation Clinical Impression  Pt was seen for f/u dysphagia tx after MBS on previous date. She consumed pureed solids and honey thick liquids with Min-Mod cues provided by SLP, but mostly needing extra time. She has oral holding and at one point started to look a little nervous and shaking her head "no" that she couldn't swallow the bolus in her mouth. Given the opportunity to expectorate, she ultimately swallowed the bolus instead. Pt wasn't sure what prompted this, but no overt signs of aspiration noted in that moment. Swallowing overall appeared to be consistent with MBS on previous date. No family was present yet this morning for education and transport had arrived to take pt for testing. Will continue to follow.    HPI HPI: [name] is a 86 y.o. female with medical history significant of hypertension, hyperlipidemia, spinal stenosis, PAD status post angioplasty to left SFA on 09/27/2018, dementia, CVA, arthritis presents to emergency department for evaluation of altered mental status. Pt has mild demntia at baseline, but typically oriented x3 MRI 9/28: "Acute infarct involving the left corona radiata. No substantial mass effect."  CXR 9/28: "Increasing density in the RIGHT infrahilar region with concomitant RIGHT effusion. This may represent pneumonia with concomitanteffusion aspiration could also have this distribution, correlatewith any risk factors for aspiration."      SLP Plan  Continue with current plan of care       Recommendations  Diet recommendations: Dysphagia 1 (puree);Honey-thick liquid Liquids provided via: Cup;Straw Medication Administration: Crushed with puree Supervision: Staff to assist with self feeding;Full supervision/cueing for  compensatory strategies Compensations: Slow rate;Small sips/bites Postural Changes and/or Swallow Maneuvers: Seated upright 90 degrees;Upright 30-60 min after meal                Oral Care Recommendations: Oral care BID Follow up Recommendations: Skilled Nursing facility SLP Visit Diagnosis: Dysphagia, oropharyngeal phase (R13.12) Plan: Continue with current plan of care       GO                Osie Bond., M.A. [facility] Pager [phone] Office [phone]  02/15/2020, 8:37 AM

## 2020-02-15 NOTE — TOC Transition Note (Signed)
Transition of Care [facility]) - CM/SW Discharge Note   Patient Details  Name: [name] MRN: [id number] Date of Birth: 10/14/1930  [facility] CM/SW Contact:  Geralynn Ochs, LCSW Phone Number: 02/15/2020, 4:45 PM   Clinical Narrative:   Nurse to call report to [phone].    Final next level of care: Long Term Nursing Home Barriers to Discharge: Barriers Resolved   Patient Goals and CMS Choice Patient states their goals for this hospitalization and ongoing recovery are:: patient unable to participate in goal setting due to disorientation CMS Medicare.gov Compare Post Acute Care list provided to:: Patient Represenative (must comment) Choice offered to / list presented to : Adult Children  Discharge Placement              Patient chooses bed at: [facility] Patient to be transferred to facility by: Family car Name of family member notified: [name] Patient and family notified of of transfer: 02/15/20  Discharge Plan and Services     Post Acute Care Choice: Nursing Home                               Social Determinants of Health (SDOH) Interventions     Readmission Risk Interventions No flowsheet data found.

## 2020-02-15 NOTE — Discharge Summary (Signed)
Physician Discharge Summary  [name] JGG:[id number] DOB: 08-12-1930 DOA: 02/13/2020  PCP: Javier Glazier, MD  Admit date: 02/13/2020 Discharge date: 02/15/2020  Admitted From: SNF  Disposition:  SNF [facility]  Recommendations for Outpatient Follow-up:  1. Follow up with Neurology in 4-6 weeks 2. Give aspirin and Plavix for 3 weeks followed by aspirin alone 3. Give Augmentin for aspiration pneumonia for 4 more days to complete 7 day course     Home Health: N/A  Equipment/Devices: TBD at SNF  Discharge Condition: Fair  CODE STATUS: DNR   Brief/Interim Summary: [name] is a 86 y.o. F with moderate dementia, facility dwelling, HTN, CVA, orthostatic hypotension, and PVD status post angioplasty 1 year ago who presented with altered mental status.  Patient noticed by staff at her facility to be unable to follow commands and unable to talk.  In the ER, hypertensive, WBC normal, ammonia normal.  CT head unremarkable.  Chest x-ray showed pneumonia with small effusion.  MRI brain showed lacunar infarct.     PRINCIPAL HOSPITAL DIAGNOSIS: Aspiration pneumonia     Discharge Diagnoses:   Acute metabolic encephalopathy due to pneumonia in setting of dementia Improved with treatment of pneumonia and IV fluids.   Right lower lobe pneumonia, parapneumonic effusion Suspect aspiration Patient admitted and started on Unasyn, improved.  Patient now mentating at baseline, taking orals.  Temp < 100 F, heart rate < 100bpm, RR < 24, SpO2 at baseline.   Stable for discharge.     Complete 7 days antibiotics with Augmentin.   Acute right corona radiata subcentimeter infarct MRI brain shows small stroke.  This was likely a small vessel stroke, incidental finding, no new focal neurological deficits.  Neurology consulted, recommended continued statin with pravastatin.    Take aspirin 81 plus Plavix 75 for 3 weeks followed by aspirin 81 mg daily indefinitely.  Atrial  fibrillation: Not present  Echo normal, carotids normal.   Hypertension Peripheral vascular disease  Hypokalemia Hypomagnesemia Repleted.  Dementia              Discharge Instructions   Allergies as of 02/15/2020      Reactions   Hydrochlorothiazide Other (See Comments)   Dropped sodium level   Penicillins    Unknown  Did it involve swelling of the face/tongue/throat, SOB, or low BP? Unknown Did it involve sudden or severe rash/hives, skin peeling, or any reaction on the inside of your mouth or nose? Unknown Did you need to seek medical attention at a hospital or doctor's office? Unknown When did it last happen?unknown If all above answers are "NO", may proceed with cephalosporin use.   Sulfa Antibiotics    unknown      Medication List    TAKE these medications   acetaminophen 325 MG tablet Commonly known as: TYLENOL Take 650 mg by mouth in the morning, at noon, and at bedtime. Take with Ultram   amLODipine 10 MG tablet Commonly known as: NORVASC Take 1 tablet (10 mg total) by mouth daily.   amoxicillin-clavulanate 875-125 MG tablet Commonly known as: AUGMENTIN Take 1 tablet by mouth every 12 (twelve) hours for 4 days.   aspirin 81 MG tablet Take 81 mg by mouth daily.   cetirizine 10 MG tablet Commonly known as: ZYRTEC Take 10 mg by mouth daily.   Cholecalciferol 50 MCG (2000 UT) Tabs Take 2,000 Units by mouth daily.   clopidogrel 75 MG tablet Commonly known as: Plavix Take 1 tablet (75 mg total) by mouth daily  for 21 days.   famotidine 20 MG tablet Commonly known as: PEPCID Take 20 mg by mouth daily.   ondansetron 4 MG tablet Commonly known as: ZOFRAN Take 4 mg by mouth 2 (two) times daily as needed for nausea or vomiting.   pravastatin 20 MG tablet Commonly known as: PRAVACHOL Take 1 tablet (20 mg total) by mouth daily at 6 PM.   sertraline 50 MG tablet Commonly known as: ZOLOFT Take 75 mg by mouth daily. Give 1 1/2 tablets  to equal 75 mg by mouth every morning   Theratears 0.25 % Soln Generic drug: Carboxymethylcellulose Sodium Apply 1 drop to eye in the morning and at bedtime. Apply 1 drop in each eye twice daily for dry eyes.   traMADol 50 MG tablet Commonly known as: ULTRAM Take 25 mg by mouth 3 (three) times daily. Give 1/2 tablet by mouth 3 times daily for pain.   valsartan 40 MG tablet Commonly known as: DIOVAN Take 40 mg by mouth daily.       Contact information for after-discharge care    Destination    [facility] .   Service: Skilled Nursing Contact information: [address] [place] [address] [phone]                 Allergies  Allergen Reactions  . Hydrochlorothiazide Other (See Comments)    Dropped sodium level  . Penicillins     Unknown  Did it involve swelling of the face/tongue/throat, SOB, or low BP? Unknown Did it involve sudden or severe rash/hives, skin peeling, or any reaction on the inside of your mouth or nose? Unknown Did you need to seek medical attention at a hospital or doctor's office? Unknown When did it last happen?unknown If all above answers are "NO", may proceed with cephalosporin use.   . Sulfa Antibiotics     unknown    Consultations:  Neurology   Procedures/Studies: CT HEAD WO CONTRAST  Result Date: 02/13/2020 CLINICAL DATA:  Delirium. EXAM: CT HEAD WITHOUT CONTRAST TECHNIQUE: Contiguous axial images were obtained from the base of the skull through the vertex without intravenous contrast. COMPARISON:  CT head 05/29/2019 FINDINGS: Brain: Motion degraded study Generalized atrophy. Extensive chronic microvascular ischemic change in the white matter. Chronic infarct left thalamus unchanged. Negative for acute infarct, hemorrhage, mass. Vascular: Negative for hyperdense vessel Skull: Negative Sinuses/Orbits: Air-fluid level right sphenoid sinus. Remaining paranasal sinuses clear.  Bilateral cataract extraction Other: None IMPRESSION: No acute abnormality. Motion degraded study Atrophy with extensive chronic microvascular ischemia. Electronically Signed   By: Franchot Gallo M.D.   On: 02/13/2020 11:51   MR ANGIO HEAD WO CONTRAST  Result Date: 02/15/2020 CLINICAL DATA:  Neuro deficit, acute, stroke suspected. EXAM: MRA HEAD WITHOUT CONTRAST TECHNIQUE: Angiographic images of the Circle of Willis were obtained using MRA technique without intravenous contrast. COMPARISON:  Brain MRI 02/13/2020. CT angiogram head/neck 01/17/2019. FINDINGS: The patient is mildly motion degraded. The intracranial internal carotid arteries are patent. Atherosclerotic irregularity of both intracranial ICAs with no more than mild stenosis. The M1 middle cerebral arteries are patent without significant stenosis. No M2 proximal branch occlusion is identified. However, new as compared to the prior CTA of 01/17/2019, there is a high-grade focal stenosis within a mid M2 left MCA branch vessel (series 1029, image 4) (series 5, image 123). The anterior cerebral arteries are patent. The intracranial vertebral arteries are patent. The basilar artery is patent. The posterior cerebral arteries are patent. Fetal  origin right PCA. New from the prior CTA, there is a mild focal stenosis within the proximal P2 right PCA and a high-grade stenosis within the mid/distal P2 right PCA. The left posterior communicating artery is hypoplastic or absent. No intracranial aneurysm is identified. IMPRESSION: Mildly motion degraded examination. No intracranial large vessel occlusion. New from the prior CTA of 01/17/2019, there is a high-grade focal stenosis within a mid M2 left MCA branch vessel. Also new from this prior exam, there are sites of up to moderate/severe stenosis within the P2 right PCA. Atherosclerotic irregularity of the intracranial ICAs with no more than mild stenosis. Electronically Signed   By: Kellie Simmering DO   On:  02/15/2020 09:51   MR BRAIN WO CONTRAST  Result Date: 02/13/2020 CLINICAL DATA:  Acute stroke suspected. EXAM: MRI HEAD WITHOUT CONTRAST TECHNIQUE: Multiplanar, multiecho pulse sequences of the brain and surrounding structures were obtained without intravenous contrast. COMPARISON:  CT head from the same day, MRI head from 01/18/2019 FINDINGS: Patient was uncooperative and therefore the exam was terminated early. T1 imaging and T2 coronal reformatted imaging was not obtained. Imaging that was obtained is limited by patient motion. Within these limitations: Brain: Linear restricted diffusion involving the left corona radiata, compatible with acute infarct. Mild associated edema in this region without substantial mass effect. Remote left thalamic lacunar infarct. No acute hemorrhage. There is extensive periventricular white matter T2/FLAIR hyperintensity, compatible with chronic microvascular ischemic disease. Generalized cerebral atrophy with stable ex vacuo ventricular dilation. No hydrocephalus. No mass lesion or abnormal mass effect. Vascular: Major proximal arterial flow voids are maintained. Skull and upper cervical spine: Limited evaluation without evidence of abnormality. Sinuses/Orbits: Mild ethmoid air cell mucosal thickening. Air-fluid level in the right sphenoid sinus. Remaining sinuses are clear. No acute orbital abnormality. Other: No mastoid effusion. IMPRESSION: Incomplete and limited examination as detailed above. Within this limitation: 1. Acute infarct involving the left corona radiata. No substantial mass effect. 2. Similar extensive chronic microvascular ischemic change and generalized cerebral atrophy. Electronically Signed   By: Margaretha Sheffield MD   On: 02/13/2020 15:40   DG Chest Portable 1 View  Result Date: 02/13/2020 CLINICAL DATA:  Altered mental status EXAM: PORTABLE CHEST 1 VIEW COMPARISON:  12/25/2017 FINDINGS: Calcified atheromatous plaque of the thoracic aorta. Similar to  prior study. Cardiomediastinal contours and hilar structures with similar appearance though increased density in the RIGHT infrahilar region partially obscured RIGHT hemidiaphragm. Graded opacity in this area suggests concomitant effusion. On limited assessment skeletal structures without acute process. Severe LEFT glenohumeral degenerative changes and signs of RIGHT shoulder arthroplasty incompletely assessed. IMPRESSION: Increasing density in the RIGHT infrahilar region with concomitant RIGHT effusion. This may represent pneumonia with concomitant effusion aspiration could also have this distribution, correlate with any risk factors for aspiration. Suggest follow-up to ensure resolution and exclude underlying lesion in this location. Electronically Signed   By: Zetta Bills M.D.   On: 02/13/2020 10:34   DG Swallowing Func-Speech Pathology  Result Date: 02/14/2020 Objective Swallowing Evaluation: Type of Study: MBS-Modified Barium Swallow Study  Patient Details Name: HAILLEY BYERS MRN: [id number] Date of Birth: 06-Oct-1930 Today's Date: 02/14/2020 Time: SLP Start Time (ACUTE ONLY): 1134 -SLP Stop Time (ACUTE ONLY): 1155 SLP Time Calculation (min) (ACUTE ONLY): 21 min Past Medical History: Past Medical History: Diagnosis Date . Adenomatous colon polyp 2007 . Arthritis  . Bilateral carotid bruits 07/27/2018 . CAD (coronary artery disease)  . Chronic back pain  . Gall stone pancreatitis  . Heart  murmur  . High cholesterol  . Hx: UTI (urinary tract infection)   Now infrequent . Hypertension  . Left knee pain Nov. 18, 2014 . Onychomycosis  . Osteoarthritis  . Osteopenia  . PAD (peripheral artery disease) ([place])  . PVD (peripheral vascular disease) ([place])  . Skin ulcer of left heel ([place])  . Trigger finger, right  . Vertigo  Past Surgical History: Past Surgical History: Procedure Laterality Date . ABDOMINAL HYSTERECTOMY    partial . CHOLECYSTECTOMY  10/30/2011  Procedure: LAPAROSCOPIC CHOLECYSTECTOMY WITH INTRAOPERATIVE  CHOLANGIOGRAM;  Surgeon: Zenovia Jarred, MD;  Location: [facility];  Service: General;  Laterality: N/A; . ENDARTERECTOMY FEMORAL Left 09/27/2018  Procedure: left Endarterectomy Femoral;  Surgeon: Angelia Mould, MD;  Location: [facility];  Service: Vascular;  Laterality: Left; . EUS  11/11/2011  Procedure: ESOPHAGEAL ENDOSCOPIC ULTRASOUND (EUS) RADIAL;  Surgeon: Arta Silence, MD;  Location: [facility];  Service: Endoscopy;  Laterality: N/A;  possible ERCP . EYE SURGERY   . FEMORAL-POPLITEAL BYPASS GRAFT Left 09/27/2018  Procedure: left FEMORAL THROMBECTOMY;  Surgeon: Angelia Mould, MD;  Location: [facility];  Service: Vascular;  Laterality: Left; . JOINT REPLACEMENT Left 06-22-06  Hip . JOINT REPLACEMENT Right 11-26-06  Shoulder . JOINT REPLACEMENT Right 02-27-06  Knee . LOWER EXTREMITY ANGIOGRAPHY N/A 09/27/2018  Procedure: LOWER EXTREMITY ANGIOGRAPHY;  Surgeon: Nigel Mormon, MD;  Location: [facility];  Service: Cardiovascular;  Laterality: N/A; . PATCH ANGIOPLASTY Left 09/27/2018  Procedure: Patch Angioplasty of left femoral artery using xenosure bovine pericardium patch;  Surgeon: Angelia Mould, MD;  Location: [facility];  Service: Vascular;  Laterality: Left; . PERIPHERAL VASCULAR INTERVENTION  09/27/2018  Procedure: PERIPHERAL VASCULAR INTERVENTION;  Surgeon: Nigel Mormon, MD;  Location: [facility];  Service: Cardiovascular;; . TOTAL HIP ARTHROPLASTY   . TOTAL KNEE ARTHROPLASTY   . TOTAL SHOULDER ARTHROPLASTY   HPI: [name] is a 86 y.o. female with medical history significant of hypertension, hyperlipidemia, spinal stenosis, PAD status post angioplasty to left SFA on 09/27/2018, dementia, CVA, arthritis presents to emergency department for evaluation of altered mental status. Pt has mild demntia at baseline, but typically oriented x3 MRI 9/28: "Acute infarct involving the left corona radiata. No substantial mass effect."  CXR 9/28: "Increasing density in the RIGHT  infrahilar region with concomitant RIGHT effusion. This may represent pneumonia with concomitanteffusion aspiration could also have this distribution, correlatewith any risk factors for aspiration."  Subjective: Pt awake, alert, confused Assessment / Plan / Recommendation CHL IP CLINICAL IMPRESSIONS 02/14/2020 Clinical Impression Pt has a significant oropharyngeal dysphagia with suspected neurological and structural components with likely more chronic impact. Pt appears to have cervical osteophytes that narrow her pharyngeal space, reducing her ability to deflect her epiglottis and leaving her laryngeal vestibule open to penetration and aspiration during the swallow. She also has reduced base of tongue retraction and limited UES opening with moderate pharyngeal residue. Thin and nectar thick liquids have a harder time clearing through the UES, with residue largely remaining in the pyriform sinuses and entering the airway after the swallow as well. Residue from honey thick liquids and purees is better contained in her valleculae, filling this space completely but also giving her more time to perform dry swallow over time (does not do them to command) with good airway protection as it works on clearing. From an oral standpoint, pt also has weak lingual manipulation, limited bolus cohesion, and prolonged posterior transit with premature spillage that occurs. Of note,  after the study when pt was partially reclined for transfer back to bed, she had regurgitation initially of what appeared to be secretions but then also a small amount of barium. Suspect that this came from her esophagus as her pharynx was almost completely clear as fluoroscopy ended. Pt said "yes" that she does get reflux sometimes (unclear accuracy as not a listed dx). Recommend starting Dys 1 (puree) diet and honey thick liquids, maintaining an upright position for at least 30 minutes after intake. Given significantly modified diet and concern for more  chronic elements, would consider also talking to the pt/family about overall GOC.  SLP Visit Diagnosis Dysphagia, oropharyngeal phase (R13.12) Attention and concentration deficit following -- Frontal lobe and executive function deficit following -- Impact on safety and function Moderate aspiration risk   CHL IP TREATMENT RECOMMENDATION 02/14/2020 Treatment Recommendations Therapy as outlined in treatment plan below   Prognosis 02/14/2020 Prognosis for Safe Diet Advancement Fair Barriers to Reach Goals Cognitive deficits;Language deficits Barriers/Prognosis Comment -- CHL IP DIET RECOMMENDATION 02/14/2020 SLP Diet Recommendations Dysphagia 1 (Puree) solids;Honey thick liquids Liquid Administration via Cup;Straw Medication Administration Crushed with puree Compensations Slow rate;Small sips/bites Postural Changes Remain semi-upright after after feeds/meals (Comment);Seated upright at 90 degrees   CHL IP OTHER RECOMMENDATIONS 02/14/2020 Recommended Consults -- Oral Care Recommendations Oral care BID Other Recommendations Order thickener from pharmacy;Prohibited food (jello, ice cream, thin soups);Remove water pitcher   CHL IP FOLLOW UP RECOMMENDATIONS 02/14/2020 Follow up Recommendations Skilled Nursing facility   [facility] IP FREQUENCY AND DURATION 02/14/2020 Speech Therapy Frequency (ACUTE ONLY) min 2x/week Treatment Duration 2 weeks      CHL IP ORAL PHASE 02/14/2020 Oral Phase Impaired Oral - Pudding Teaspoon -- Oral - Pudding Cup -- Oral - Honey Teaspoon -- Oral - Honey Cup Weak lingual manipulation;Lingual pumping;Reduced posterior propulsion;Lingual/palatal residue;Delayed oral transit;Decreased bolus cohesion;Premature spillage Oral - Nectar Teaspoon -- Oral - Nectar Cup -- Oral - Nectar Straw Weak lingual manipulation;Lingual pumping;Reduced posterior propulsion;Lingual/palatal residue;Delayed oral transit;Decreased bolus cohesion;Premature spillage Oral - Thin Teaspoon -- Oral - Thin Cup Weak lingual manipulation;Lingual  pumping;Reduced posterior propulsion;Lingual/palatal residue;Delayed oral transit;Decreased bolus cohesion;Premature spillage Oral - Thin Straw Weak lingual manipulation;Lingual pumping;Reduced posterior propulsion;Lingual/palatal residue;Delayed oral transit;Decreased bolus cohesion;Premature spillage Oral - Puree Weak lingual manipulation;Lingual pumping;Reduced posterior propulsion;Lingual/palatal residue;Delayed oral transit;Decreased bolus cohesion;Premature spillage Oral - Mech Soft -- Oral - Regular -- Oral - Multi-Consistency -- Oral - Pill -- Oral Phase - Comment --  CHL IP PHARYNGEAL PHASE 02/14/2020 Pharyngeal Phase Impaired Pharyngeal- Pudding Teaspoon -- Pharyngeal -- Pharyngeal- Pudding Cup -- Pharyngeal -- Pharyngeal- Honey Teaspoon -- Pharyngeal -- Pharyngeal- Honey Cup Reduced epiglottic inversion;Reduced airway/laryngeal closure;Reduced tongue base retraction;Pharyngeal residue - valleculae;Pharyngeal residue - pyriform Pharyngeal -- Pharyngeal- Nectar Teaspoon -- Pharyngeal -- Pharyngeal- Nectar Cup -- Pharyngeal -- Pharyngeal- Nectar Straw Reduced epiglottic inversion;Reduced airway/laryngeal closure;Reduced tongue base retraction;Pharyngeal residue - valleculae;Pharyngeal residue - pyriform;Penetration/Aspiration during swallow;Penetration/Apiration after swallow Pharyngeal Material enters airway, passes BELOW cords and not ejected out despite cough attempt by patient Pharyngeal- Thin Teaspoon -- Pharyngeal -- Pharyngeal- Thin Cup Reduced epiglottic inversion;Reduced airway/laryngeal closure;Reduced tongue base retraction;Pharyngeal residue - valleculae;Pharyngeal residue - pyriform;Penetration/Aspiration during swallow;Penetration/Apiration after swallow Pharyngeal Material enters airway, passes BELOW cords and not ejected out despite cough attempt by patient Pharyngeal- Thin Straw Reduced epiglottic inversion;Reduced airway/laryngeal closure;Reduced tongue base retraction;Pharyngeal residue -  valleculae;Pharyngeal residue - pyriform;Penetration/Aspiration during swallow;Penetration/Apiration after swallow Pharyngeal Material enters airway, passes BELOW cords and not ejected out despite cough attempt by patient Pharyngeal- Puree Reduced epiglottic inversion;Reduced  airway/laryngeal closure;Reduced tongue base retraction;Pharyngeal residue - valleculae;Pharyngeal residue - pyriform Pharyngeal -- Pharyngeal- Mechanical Soft -- Pharyngeal -- Pharyngeal- Regular -- Pharyngeal -- Pharyngeal- Multi-consistency -- Pharyngeal -- Pharyngeal- Pill -- Pharyngeal -- Pharyngeal Comment --  CHL IP CERVICAL ESOPHAGEAL PHASE 02/14/2020 Cervical Esophageal Phase Impaired Pudding Teaspoon -- Pudding Cup -- Honey Teaspoon -- Honey Cup Reduced cricopharyngeal relaxation Nectar Teaspoon -- Nectar Cup -- Nectar Straw Reduced cricopharyngeal relaxation Thin Teaspoon -- Thin Cup Reduced cricopharyngeal relaxation Thin Straw Reduced cricopharyngeal relaxation Puree Reduced cricopharyngeal relaxation Mechanical Soft -- Regular -- Multi-consistency -- Pill -- Cervical Esophageal Comment -- Osie Bond., M.A. CCC-SLP Acute Rehabilitation Services Pager [phone] Office [phone] 02/14/2020, 2:16 PM              ECHOCARDIOGRAM COMPLETE  Result Date: 02/15/2020    ECHOCARDIOGRAM REPORT   Patient Name:   [name] Date of Exam: 02/15/2020 Medical Rec #:  [id number]        Height:       60.0 in Accession #:    [id number]       Weight:       148.8 lb Date of Birth:  06-16-1930        BSA:          1.646 m Patient Age:    65 years         BP:           171/85 mmHg Patient Gender: F                HR:           88 bpm. Exam Location:  Inpatient Procedure: 2D Echo Indications:    stroke 434.91  History:        Patient has prior history of Echocardiogram examinations, most                 recent 01/18/2019. Stroke; Risk Factors:Hypertension and                 Dyslipidemia.  Sonographer:    Johny Chess Referring Phys: [place]  1. Left ventricular ejection fraction, by estimation, is 60 to 65%. The left ventricle has normal function. The left ventricle has no regional wall motion abnormalities. Left ventricular diastolic parameters are consistent with Grade I diastolic dysfunction (impaired relaxation).  2. Right ventricular systolic function is normal. The right ventricular size is normal. There is normal pulmonary artery systolic pressure.  3. The mitral valve is degenerative. Trivial mitral valve regurgitation.  4. The aortic valve is calcified. There is severe calcifcation of the aortic valve. There is moderate thickening of the aortic valve. Aortic valve regurgitation is mild. Mild aortic valve stenosis.  5. The inferior vena cava is dilated in size with >50% respiratory variability, suggesting right atrial pressure of 8 mmHg. Comparison(s): No significant change from prior study. Conclusion(s)/Recommendation(s): No intracardiac source of embolism detected on this transthoracic study. A transesophageal echocardiogram is recommended to exclude cardiac source of embolism if clinically indicated. FINDINGS  Left Ventricle: Left ventricular ejection fraction, by estimation, is 60 to 65%. The left ventricle has normal function. The left ventricle has no regional wall motion abnormalities. The left ventricular internal cavity size was normal in size. There is  no left ventricular hypertrophy. Left ventricular diastolic parameters are consistent with Grade I diastolic dysfunction (impaired relaxation). Right Ventricle: The right ventricular size is normal. Right vetricular wall thickness was not assessed. Right ventricular systolic function is normal.  There is normal pulmonary artery systolic pressure. The tricuspid regurgitant velocity is 2.59 m/s, and with an assumed right atrial pressure of 3 mmHg, the estimated right ventricular systolic pressure is 93.8 mmHg. Left Atrium: Left atrial size was normal in size.  Right Atrium: Right atrial size was normal in size. Pericardium: There is no evidence of pericardial effusion. Mitral Valve: The mitral valve is degenerative in appearance. There is moderate thickening of the mitral valve leaflet(s). There is mild calcification of the mitral valve leaflet(s). Mild mitral annular calcification. Trivial mitral valve regurgitation. Tricuspid Valve: The tricuspid valve is normal in structure. Tricuspid valve regurgitation is trivial. Aortic Valve: The aortic valve is calcified. There is severe calcifcation of the aortic valve. There is moderate thickening of the aortic valve. There is moderate to severe aortic valve annular calcification. Aortic valve regurgitation is mild. Mild aortic stenosis is present. Aortic valve mean gradient measures 10.0 mmHg. Aortic valve peak gradient measures 20.2 mmHg. Aortic valve area, by VTI measures 1.23 cm. Pulmonic Valve: The pulmonic valve was grossly normal. Pulmonic valve regurgitation is not visualized. Aorta: The aortic root is normal in size and structure. Venous: The inferior vena cava is dilated in size with greater than 50% respiratory variability, suggesting right atrial pressure of 8 mmHg. IAS/Shunts: No atrial level shunt detected by color flow Doppler.  LEFT VENTRICLE PLAX 2D LVIDd:         3.50 cm  Diastology LVIDs:         1.90 cm  LV e' medial:    6.42 cm/s LV PW:         0.90 cm  LV E/e' medial:  13.0 LV IVS:        0.80 cm  LV e' lateral:   7.40 cm/s LVOT diam:     1.50 cm  LV E/e' lateral: 11.2 LV SV:         50 LV SV Index:   30 LVOT Area:     1.77 cm  RIGHT VENTRICLE             IVC RV S prime:     14.30 cm/s  IVC diam: 2.00 cm TAPSE (M-mode): 2.5 cm LEFT ATRIUM             Index       RIGHT ATRIUM           Index LA diam:        3.20 cm 1.94 cm/m  RA Area:     13.10 cm LA Vol (A2C):   31.0 ml 18.83 ml/m RA Volume:   30.50 ml  18.53 ml/m LA Vol (A4C):   48.5 ml 29.46 ml/m LA Biplane Vol: 42.6 ml 25.88 ml/m  AORTIC VALVE AV  Area (Vmax):    0.99 cm AV Area (Vmean):   1.01 cm AV Area (VTI):     1.06 cm AV Vmax:           225.00 cm/s AV Vmean:          148.500 cm/s AV VTI:            0.466 m AV Peak Grad:      20.2 mmHg AV Mean Grad:      10.0 mmHg LVOT Vmax:         126.00 cm/s LVOT Vmean:        84.750 cm/s LVOT VTI:          0.280 m LVOT/AV VTI ratio: 0.60  AORTA Ao Root  diam: 2.90 cm MITRAL VALVE                TRICUSPID VALVE MV Area (PHT): 3.99 cm     TR Peak grad:   26.8 mmHg MV Decel Time: 190 msec     TR Vmax:        259.00 cm/s MV E velocity: 83.20 cm/s MV A velocity: 129.00 cm/s  SHUNTS MV E/A ratio:  0.64         Systemic VTI:  0.28 m                             Systemic Diam: 1.50 cm Gwyndolyn Kaufman MD Electronically signed by Gwyndolyn Kaufman MD Signature Date/Time: 02/15/2020/1:57:08 PM    Final    VAS US CAROTID  Result Date: 02/15/2020 Carotid Arterial Duplex Study Indications:       CVA. Limitations        Today's exam was limited due to the body habitus of the                    patient. Comparison Study:  No prior studies. Performing Technologist: Oliver Hum RVT  Examination Guidelines: A complete evaluation includes B-mode imaging, spectral Doppler, color Doppler, and power Doppler as needed of all accessible portions of each vessel. Bilateral testing is considered an integral part of a complete examination. Limited examinations for reoccurring indications may be performed as noted.  Right Carotid Findings: +----------+--------+--------+--------+-----------------------+--------+           PSV cm/sEDV cm/sStenosisPlaque Description     Comments +----------+--------+--------+--------+-----------------------+--------+ CCA Prox  104     1               smooth and heterogenoustortuous +----------+--------+--------+--------+-----------------------+--------+ CCA Distal74      10              smooth and heterogenous          +----------+--------+--------+--------+-----------------------+--------+ ICA Prox  76      14              calcific                        +----------+--------+--------+--------+-----------------------+--------+ ICA Distal64      13                                     tortuous +----------+--------+--------+--------+-----------------------+--------+ ECA       86      7                                               +----------+--------+--------+--------+-----------------------+--------+ +----------+--------+-------+--------+-------------------+           PSV cm/sEDV cmsDescribeArm Pressure (mmHG) +----------+--------+-------+--------+-------------------+ Subclavian182                                        +----------+--------+-------+--------+-------------------+ +---------+--------+--+--------+-+---------+ VertebralPSV cm/s36EDV cm/s5Antegrade +---------+--------+--+--------+-+---------+  Left Carotid Findings: +----------+--------+-------+--------+--------------------------------+--------+           PSV cm/sEDV    StenosisPlaque Description              Comments  cm/s                                                    +----------+--------+-------+--------+--------------------------------+--------+ CCA Prox  65      6              smooth, heterogenous and                                                  calcific                                 +----------+--------+-------+--------+--------------------------------+--------+ CCA Distal66      9              smooth and heterogenous                  +----------+--------+-------+--------+--------------------------------+--------+ ICA Prox  69      12             smooth, heterogenous and        tortuous                                  calcific                                 +----------+--------+-------+--------+--------------------------------+--------+ ICA  Distal63      13                                             tortuous +----------+--------+-------+--------+--------------------------------+--------+ ECA       60      4                                                       +----------+--------+-------+--------+--------------------------------+--------+ +----------+--------+--------+--------+-------------------+           PSV cm/sEDV cm/sDescribeArm Pressure (mmHG) +----------+--------+--------+--------+-------------------+ [id number]                                         +----------+--------+--------+--------+-------------------+ +---------+--------+--+--------+-+---------+ VertebralPSV cm/s40EDV cm/s7Antegrade +---------+--------+--+--------+-+---------+   Summary: Right Carotid: Velocities in the right ICA are consistent with a 1-39% stenosis. Left Carotid: Velocities in the left ICA are consistent with a 1-39% stenosis. Vertebrals: Bilateral vertebral arteries demonstrate antegrade flow. *See table(s) above for measurements and observations.  Electronically signed by Antony Contras MD on 02/15/2020 at 12:30:37 PM.    Final        Subjective: Feeling well.  No fever.  No cough.  No worsening confusion, chest pain or dyspnea.    Discharge Exam: Vitals:   02/15/20 0747 02/15/20 1100  BP: (!) 171/85 (!) 160/68  Pulse: 97 79  Resp: 18  19  Temp: 98.1 F (36.7 C) 98.2 F (36.8 C)  SpO2: 95% 93%   Vitals:   02/14/20 2302 02/15/20 0308 02/15/20 0747 02/15/20 1100  BP: (!) 153/64 (!) 144/61 (!) 171/85 (!) 160/68  Pulse: 90 89 97 79  Resp: 18 18 18 19   Temp: 98 F (36.7 C) 98.3 F (36.8 C) 98.1 F (36.7 C) 98.2 F (36.8 C)  TempSrc: Oral Oral Oral Oral  SpO2: 92% 93% 95% 93%  Weight:  67.5 kg      General: Pt is alert, awake, not in acute distress, sitting up in bed. Cardiovascular: RRR, nl S1-S2, no murmurs appreciated.   No LE edema.   Respiratory: Normal respiratory rate and rhythm.  CTAB without  rales or wheezes. Abdominal: Abdomen soft and non-tender.  No distension or HSM.   Neuro/Psych: Strength symmetric in upper and lower extremities but generally weak.  Judgment and insight appear moderately impaired.   The results of significant diagnostics from this hospitalization (including imaging, microbiology, ancillary and laboratory) are listed below for reference.     Microbiology: Recent Results (from the past 240 hour(s))  Urine culture     Status: Abnormal   Collection Time: 02/13/20 10:08 AM   Specimen: Urine, Random  Result Value Ref Range Status   Specimen Description URINE, RANDOM  Final   Special Requests   Final    NONE Performed at [facility], [address], [place], [place] [address]    Culture MULTIPLE SPECIES PRESENT, SUGGEST RECOLLECTION (A)  Final   Report Status 02/14/2020 FINAL  Final  Respiratory Panel by RT PCR (Flu A&B, Covid) - Nasopharyngeal Swab     Status: None   Collection Time: 02/13/20 10:15 AM   Specimen: Nasopharyngeal Swab  Result Value Ref Range Status   SARS Coronavirus 2 by RT PCR NEGATIVE NEGATIVE Final    Comment: (NOTE) SARS-CoV-2 target nucleic acids are NOT DETECTED.  The SARS-CoV-2 RNA is generally detectable in upper respiratoy specimens during the acute phase of infection. The lowest concentration of SARS-CoV-2 viral copies this assay can detect is 131 copies/mL. A negative result does not preclude SARS-Cov-2 infection and should not be used as the sole basis for treatment or other patient management decisions. A negative result may occur with  improper specimen collection/handling, submission of specimen other than nasopharyngeal swab, presence of viral mutation(s) within the areas targeted by this assay, and inadequate number of viral copies (<131 copies/mL). A negative result must be combined with clinical observations, patient history, and epidemiological information. The expected result is Negative.  Fact Sheet  for Patients:  PinkCheek.be  Fact Sheet for Healthcare Providers:  GravelBags.it  This test is no t yet approved or cleared by the [place] FDA and  has been authorized for detection and/or diagnosis of SARS-CoV-2 by FDA under an Emergency Use Authorization (EUA). This EUA will remain  in effect (meaning this test can be used) for the duration of the COVID-19 declaration under Section 564(b)(1) of the Act, 21 U.S.C. section 360bbb-3(b)(1), unless the authorization is terminated or revoked sooner.     Influenza A by PCR NEGATIVE NEGATIVE Final   Influenza B by PCR NEGATIVE NEGATIVE Final    Comment: (NOTE) The Xpert Xpress SARS-CoV-2/FLU/RSV assay is intended as an aid in  the diagnosis of influenza from Nasopharyngeal swab specimens and  should not be used as a sole basis for treatment. Nasal washings and  aspirates are unacceptable for Xpert Xpress SARS-CoV-2/FLU/RSV  testing.  Fact Sheet for Patients: PinkCheek.be  Fact Sheet for Healthcare Providers: GravelBags.it  This test is not yet approved or cleared by the [place] FDA and  has been authorized for detection and/or diagnosis of SARS-CoV-2 by  FDA under an Emergency Use Authorization (EUA). This EUA will remain  in effect (meaning this test can be used) for the duration of the  Covid-19 declaration under Section 564(b)(1) of the Act, 21  U.S.C. section 360bbb-3(b)(1), unless the authorization is  terminated or revoked. Performed at [facility], [place] [address], [place], [place] [address]   Culture, blood (routine x 2)     Status: None (Preliminary result)   Collection Time: 02/13/20 12:58 PM   Specimen: BLOOD  Result Value Ref Range Status   Specimen Description BLOOD SITE NOT SPECIFIED  Final   Special Requests   Final    BOTTLES DRAWN AEROBIC AND ANAEROBIC Blood Culture adequate  volume   Culture   Final    NO GROWTH 2 DAYS Performed at [facility], [address], [place], [place] [address]    Report Status PENDING  Incomplete  Culture, blood (routine x 2)     Status: None (Preliminary result)   Collection Time: 02/13/20  7:02 PM   Specimen: BLOOD LEFT HAND  Result Value Ref Range Status   Specimen Description BLOOD LEFT HAND  Final   Special Requests   Final    BOTTLES DRAWN AEROBIC AND ANAEROBIC Blood Culture results may not be optimal due to an inadequate volume of blood received in culture bottles   Culture   Final    NO GROWTH 2 DAYS Performed at [facility], [place] [address] [place], [place] [address]    Report Status PENDING  Incomplete  MRSA PCR Screening     Status: None   Collection Time: 02/14/20  3:41 AM   Specimen: Nasal Mucosa; Nasopharyngeal  Result Value Ref Range Status   MRSA by PCR NEGATIVE NEGATIVE Final    Comment:        The GeneXpert MRSA Assay (FDA approved for NASAL specimens only), is one component of a comprehensive MRSA colonization surveillance program. It is not intended to diagnose MRSA infection nor to guide or monitor treatment for MRSA infections. Performed at [facility], [place] [address] [place], [place] [address]      Labs: BNP (last 3 results) No results for input(s): BNP in the last 8760 hours. Basic Metabolic Panel: Recent Labs  Lab 02/13/20 0941 02/13/20 1011 02/13/20 1037 02/13/20 1259 02/14/20 0632 02/15/20 0736  NA 140 141 140  --  140 144  K 3.5 3.5 3.4*  --  3.0* 3.5  CL 104 100  --   --  103 104  CO2 30  --   --   --  28 29  GLUCOSE 124* 118*  --   --  92 115*  BUN 14 14  --   --  6* 15  CREATININE 0.89 0.80  --  0.83 0.75 0.78  CALCIUM 9.0  --   --   --  8.6* 8.9  MG  --   --   --  1.6* 1.9  --   PHOS  --   --   --  3.1  --   --    Liver Function Tests: Recent Labs  Lab 02/13/20 0941 02/14/20 0632 02/15/20 0736  AST 16 15 14*  ALT 11 10 10   ALKPHOS 89 80  83   BILITOT 0.8 1.0 0.7  PROT 5.8* 5.4* 5.5*  ALBUMIN 3.4* 3.0* 3.0*   No results for input(s): LIPASE, AMYLASE in the last 168 hours. Recent Labs  Lab 02/13/20 1008  AMMONIA 19   CBC: Recent Labs  Lab 02/13/20 0941 02/13/20 0941 02/13/20 1011 02/13/20 1037 02/13/20 1259 02/14/20 0632 02/15/20 0736  WBC 8.0  --   --   --  9.4 7.8 8.6  NEUTROABS 5.1  --   --   --   --   --   --   HGB 12.6   < > 13.3 12.2 12.3 12.2 11.6*  HCT 39.5   < > 39.0 36.0 38.9 37.5 36.4  MCV 99.0  --   --   --  102.1* 98.4 98.6  PLT 272  --   --   --  255 256 238   < > = values in this interval not displayed.   Cardiac Enzymes: No results for input(s): CKTOTAL, CKMB, CKMBINDEX, TROPONINI in the last 168 hours. BNP: Invalid input(s): POCBNP CBG: Recent Labs  Lab 02/13/20 1012  GLUCAP 119*   D-Dimer No results for input(s): DDIMER in the last 72 hours. Hgb A1c Recent Labs    02/14/20 1734  HGBA1C 5.0   Lipid Profile Recent Labs    02/15/20 0736  CHOL 138  HDL 42  LDLCALC 86  TRIG 50  CHOLHDL 3.3   Thyroid function studies Recent Labs    02/13/20 1259  TSH 0.706   Anemia work up No results for input(s): VITAMINB12, FOLATE, FERRITIN, TIBC, IRON, RETICCTPCT in the last 72 hours. Urinalysis    Component Value Date/Time   COLORURINE YELLOW 02/13/2020 1329   APPEARANCEUR CLEAR 02/13/2020 1329   LABSPEC 1.009 02/13/2020 1329   PHURINE 6.0 02/13/2020 1329   GLUCOSEU NEGATIVE 02/13/2020 1329   HGBUR SMALL (A) 02/13/2020 1329   BILIRUBINUR NEGATIVE 02/13/2020 1329   KETONESUR 5 (A) 02/13/2020 1329   PROTEINUR NEGATIVE 02/13/2020 1329   UROBILINOGEN 0.2 10/27/2011 2204   NITRITE NEGATIVE 02/13/2020 1329   LEUKOCYTESUR NEGATIVE 02/13/2020 1329   Sepsis Labs Invalid input(s): PROCALCITONIN,  WBC,  LACTICIDVEN Microbiology Recent Results (from the past 240 hour(s))  Urine culture     Status: Abnormal   Collection Time: 02/13/20 10:08 AM   Specimen: Urine, Random  Result Value  Ref Range Status   Specimen Description URINE, RANDOM  Final   Special Requests   Final    NONE Performed at [facility], [place] [address] [place], [place] [address]    Culture MULTIPLE SPECIES PRESENT, SUGGEST RECOLLECTION (A)  Final   Report Status 02/14/2020 FINAL  Final  Respiratory Panel by RT PCR (Flu A&B, Covid) - Nasopharyngeal Swab     Status: None   Collection Time: 02/13/20 10:15 AM   Specimen: Nasopharyngeal Swab  Result Value Ref Range Status   SARS Coronavirus 2 by RT PCR NEGATIVE NEGATIVE Final    Comment: (NOTE) SARS-CoV-2 target nucleic acids are NOT DETECTED.  The SARS-CoV-2 RNA is generally detectable in upper respiratoy specimens during the acute phase of infection. The lowest concentration of SARS-CoV-2 viral copies this assay can detect is 131 copies/mL. A negative result does not preclude SARS-Cov-2 infection and should not be used as the sole basis for treatment or other patient management decisions. A negative result may occur with  improper specimen collection/handling, submission of specimen other than nasopharyngeal swab, presence of viral mutation(s) within the areas targeted by this assay,  and inadequate number of viral copies (<131 copies/mL). A negative result must be combined with clinical observations, patient history, and epidemiological information. The expected result is Negative.  Fact Sheet for Patients:  PinkCheek.be  Fact Sheet for Healthcare Providers:  GravelBags.it  This test is no t yet approved or cleared by the [place] FDA and  has been authorized for detection and/or diagnosis of SARS-CoV-2 by FDA under an Emergency Use Authorization (EUA). This EUA will remain  in effect (meaning this test can be used) for the duration of the COVID-19 declaration under Section 564(b)(1) of the Act, 21 U.S.C. section 360bbb-3(b)(1), unless the authorization is terminated  or revoked sooner.     Influenza A by PCR NEGATIVE NEGATIVE Final   Influenza B by PCR NEGATIVE NEGATIVE Final    Comment: (NOTE) The Xpert Xpress SARS-CoV-2/FLU/RSV assay is intended as an aid in  the diagnosis of influenza from Nasopharyngeal swab specimens and  should not be used as a sole basis for treatment. Nasal washings and  aspirates are unacceptable for Xpert Xpress SARS-CoV-2/FLU/RSV  testing.  Fact Sheet for Patients: PinkCheek.be  Fact Sheet for Healthcare Providers: GravelBags.it  This test is not yet approved or cleared by the [place] FDA and  has been authorized for detection and/or diagnosis of SARS-CoV-2 by  FDA under an Emergency Use Authorization (EUA). This EUA will remain  in effect (meaning this test can be used) for the duration of the  Covid-19 declaration under Section 564(b)(1) of the Act, 21  U.S.C. section 360bbb-3(b)(1), unless the authorization is  terminated or revoked. Performed at [facility], [place] [address], [place], [place] [address]   Culture, blood (routine x 2)     Status: None (Preliminary result)   Collection Time: 02/13/20 12:58 PM   Specimen: BLOOD  Result Value Ref Range Status   Specimen Description BLOOD SITE NOT SPECIFIED  Final   Special Requests   Final    BOTTLES DRAWN AEROBIC AND ANAEROBIC Blood Culture adequate volume   Culture   Final    NO GROWTH 2 DAYS Performed at [facility], [address], [place], [place] [address]    Report Status PENDING  Incomplete  Culture, blood (routine x 2)     Status: None (Preliminary result)   Collection Time: 02/13/20  7:02 PM   Specimen: BLOOD LEFT HAND  Result Value Ref Range Status   Specimen Description BLOOD LEFT HAND  Final   Special Requests   Final    BOTTLES DRAWN AEROBIC AND ANAEROBIC Blood Culture results may not be optimal due to an inadequate volume of blood received in culture bottles    Culture   Final    NO GROWTH 2 DAYS Performed at [facility], [place] [address] [place], [place] [address]    Report Status PENDING  Incomplete  MRSA PCR Screening     Status: None   Collection Time: 02/14/20  3:41 AM   Specimen: Nasal Mucosa; Nasopharyngeal  Result Value Ref Range Status   MRSA by PCR NEGATIVE NEGATIVE Final    Comment:        The GeneXpert MRSA Assay (FDA approved for NASAL specimens only), is one component of a comprehensive MRSA colonization surveillance program. It is not intended to diagnose MRSA infection nor to guide or monitor treatment for MRSA infections. Performed at [facility], [place] [address], [place], [place] [address]      Time coordinating discharge: 45 minutes  The [place] controlled substances registry was reviewed for this patient     SIGNED:   Edwin Dada, MD  [facility] 02/15/2020, 3:44 PM

## 2020-02-16 DIAGNOSIS — I739 Peripheral vascular disease, unspecified: Secondary | ICD-10-CM | POA: Diagnosis not present

## 2020-02-16 DIAGNOSIS — I10 Essential (primary) hypertension: Secondary | ICD-10-CM | POA: Diagnosis not present

## 2020-02-16 DIAGNOSIS — J690 Pneumonitis due to inhalation of food and vomit: Secondary | ICD-10-CM | POA: Diagnosis not present

## 2020-02-16 DIAGNOSIS — I639 Cerebral infarction, unspecified: Secondary | ICD-10-CM | POA: Diagnosis not present

## 2020-02-18 LAB — CULTURE, BLOOD (ROUTINE X 2)
Culture: NO GROWTH
Culture: NO GROWTH
Special Requests: ADEQUATE

## 2020-02-20 DIAGNOSIS — F0390 Unspecified dementia without behavioral disturbance: Secondary | ICD-10-CM | POA: Diagnosis not present

## 2020-02-20 DIAGNOSIS — F331 Major depressive disorder, recurrent, moderate: Secondary | ICD-10-CM | POA: Diagnosis not present

## 2020-03-05 DIAGNOSIS — F0390 Unspecified dementia without behavioral disturbance: Secondary | ICD-10-CM | POA: Diagnosis not present

## 2020-03-05 DIAGNOSIS — F418 Other specified anxiety disorders: Secondary | ICD-10-CM | POA: Diagnosis not present

## 2020-03-05 DIAGNOSIS — F331 Major depressive disorder, recurrent, moderate: Secondary | ICD-10-CM | POA: Diagnosis not present

## 2020-04-08 DIAGNOSIS — E785 Hyperlipidemia, unspecified: Secondary | ICD-10-CM | POA: Diagnosis not present

## 2020-04-08 DIAGNOSIS — R1310 Dysphagia, unspecified: Secondary | ICD-10-CM | POA: Diagnosis not present

## 2020-04-08 DIAGNOSIS — I739 Peripheral vascular disease, unspecified: Secondary | ICD-10-CM | POA: Diagnosis not present

## 2020-04-08 DIAGNOSIS — E559 Vitamin D deficiency, unspecified: Secondary | ICD-10-CM | POA: Diagnosis not present

## 2020-04-08 DIAGNOSIS — T7840XD Allergy, unspecified, subsequent encounter: Secondary | ICD-10-CM | POA: Diagnosis not present

## 2020-04-08 DIAGNOSIS — E46 Unspecified protein-calorie malnutrition: Secondary | ICD-10-CM | POA: Diagnosis not present

## 2020-04-08 DIAGNOSIS — F32A Depression, unspecified: Secondary | ICD-10-CM | POA: Diagnosis not present

## 2020-04-08 DIAGNOSIS — M1990 Unspecified osteoarthritis, unspecified site: Secondary | ICD-10-CM | POA: Diagnosis not present

## 2020-04-08 DIAGNOSIS — I129 Hypertensive chronic kidney disease with stage 1 through stage 4 chronic kidney disease, or unspecified chronic kidney disease: Secondary | ICD-10-CM | POA: Diagnosis not present

## 2020-04-08 DIAGNOSIS — I639 Cerebral infarction, unspecified: Secondary | ICD-10-CM | POA: Diagnosis not present

## 2020-04-08 DIAGNOSIS — N183 Chronic kidney disease, stage 3 unspecified: Secondary | ICD-10-CM | POA: Diagnosis not present

## 2020-04-29 DIAGNOSIS — F0150 Vascular dementia without behavioral disturbance: Secondary | ICD-10-CM | POA: Diagnosis not present

## 2020-04-29 DIAGNOSIS — I10 Essential (primary) hypertension: Secondary | ICD-10-CM | POA: Diagnosis not present

## 2020-04-29 DIAGNOSIS — I739 Peripheral vascular disease, unspecified: Secondary | ICD-10-CM | POA: Diagnosis not present

## 2020-04-29 DIAGNOSIS — I679 Cerebrovascular disease, unspecified: Secondary | ICD-10-CM | POA: Diagnosis not present

## 2020-04-30 DIAGNOSIS — F0390 Unspecified dementia without behavioral disturbance: Secondary | ICD-10-CM | POA: Diagnosis not present

## 2020-04-30 DIAGNOSIS — F418 Other specified anxiety disorders: Secondary | ICD-10-CM | POA: Diagnosis not present

## 2020-04-30 DIAGNOSIS — I69351 Hemiplegia and hemiparesis following cerebral infarction affecting right dominant side: Secondary | ICD-10-CM | POA: Diagnosis not present

## 2020-04-30 DIAGNOSIS — F331 Major depressive disorder, recurrent, moderate: Secondary | ICD-10-CM | POA: Diagnosis not present

## 2020-04-30 DIAGNOSIS — R1312 Dysphagia, oropharyngeal phase: Secondary | ICD-10-CM | POA: Diagnosis not present

## 2020-04-30 DIAGNOSIS — R278 Other lack of coordination: Secondary | ICD-10-CM | POA: Diagnosis not present

## 2020-05-01 DIAGNOSIS — R278 Other lack of coordination: Secondary | ICD-10-CM | POA: Diagnosis not present

## 2020-05-01 DIAGNOSIS — R1312 Dysphagia, oropharyngeal phase: Secondary | ICD-10-CM | POA: Diagnosis not present

## 2020-05-01 DIAGNOSIS — I69351 Hemiplegia and hemiparesis following cerebral infarction affecting right dominant side: Secondary | ICD-10-CM | POA: Diagnosis not present

## 2020-05-03 DIAGNOSIS — I69351 Hemiplegia and hemiparesis following cerebral infarction affecting right dominant side: Secondary | ICD-10-CM | POA: Diagnosis not present

## 2020-05-03 DIAGNOSIS — R1312 Dysphagia, oropharyngeal phase: Secondary | ICD-10-CM | POA: Diagnosis not present

## 2020-05-03 DIAGNOSIS — R278 Other lack of coordination: Secondary | ICD-10-CM | POA: Diagnosis not present

## 2020-05-04 DIAGNOSIS — R278 Other lack of coordination: Secondary | ICD-10-CM | POA: Diagnosis not present

## 2020-05-04 DIAGNOSIS — R1312 Dysphagia, oropharyngeal phase: Secondary | ICD-10-CM | POA: Diagnosis not present

## 2020-05-04 DIAGNOSIS — I69351 Hemiplegia and hemiparesis following cerebral infarction affecting right dominant side: Secondary | ICD-10-CM | POA: Diagnosis not present

## 2020-05-06 DIAGNOSIS — R1312 Dysphagia, oropharyngeal phase: Secondary | ICD-10-CM | POA: Diagnosis not present

## 2020-05-06 DIAGNOSIS — R278 Other lack of coordination: Secondary | ICD-10-CM | POA: Diagnosis not present

## 2020-05-06 DIAGNOSIS — I69351 Hemiplegia and hemiparesis following cerebral infarction affecting right dominant side: Secondary | ICD-10-CM | POA: Diagnosis not present

## 2020-05-07 DIAGNOSIS — R1312 Dysphagia, oropharyngeal phase: Secondary | ICD-10-CM | POA: Diagnosis not present

## 2020-05-07 DIAGNOSIS — R278 Other lack of coordination: Secondary | ICD-10-CM | POA: Diagnosis not present

## 2020-05-07 DIAGNOSIS — I69351 Hemiplegia and hemiparesis following cerebral infarction affecting right dominant side: Secondary | ICD-10-CM | POA: Diagnosis not present

## 2020-05-09 DIAGNOSIS — R1312 Dysphagia, oropharyngeal phase: Secondary | ICD-10-CM | POA: Diagnosis not present

## 2020-05-09 DIAGNOSIS — R278 Other lack of coordination: Secondary | ICD-10-CM | POA: Diagnosis not present

## 2020-05-09 DIAGNOSIS — I69351 Hemiplegia and hemiparesis following cerebral infarction affecting right dominant side: Secondary | ICD-10-CM | POA: Diagnosis not present

## 2020-05-13 DIAGNOSIS — R1312 Dysphagia, oropharyngeal phase: Secondary | ICD-10-CM | POA: Diagnosis not present

## 2020-05-13 DIAGNOSIS — I69351 Hemiplegia and hemiparesis following cerebral infarction affecting right dominant side: Secondary | ICD-10-CM | POA: Diagnosis not present

## 2020-05-13 DIAGNOSIS — R278 Other lack of coordination: Secondary | ICD-10-CM | POA: Diagnosis not present

## 2020-05-15 DIAGNOSIS — R1312 Dysphagia, oropharyngeal phase: Secondary | ICD-10-CM | POA: Diagnosis not present

## 2020-05-15 DIAGNOSIS — I69351 Hemiplegia and hemiparesis following cerebral infarction affecting right dominant side: Secondary | ICD-10-CM | POA: Diagnosis not present

## 2020-05-15 DIAGNOSIS — R278 Other lack of coordination: Secondary | ICD-10-CM | POA: Diagnosis not present

## 2020-05-16 DIAGNOSIS — I69351 Hemiplegia and hemiparesis following cerebral infarction affecting right dominant side: Secondary | ICD-10-CM | POA: Diagnosis not present

## 2020-05-16 DIAGNOSIS — R1312 Dysphagia, oropharyngeal phase: Secondary | ICD-10-CM | POA: Diagnosis not present

## 2020-05-16 DIAGNOSIS — R278 Other lack of coordination: Secondary | ICD-10-CM | POA: Diagnosis not present

## 2020-05-20 DIAGNOSIS — R1312 Dysphagia, oropharyngeal phase: Secondary | ICD-10-CM | POA: Diagnosis not present

## 2020-05-20 DIAGNOSIS — I69351 Hemiplegia and hemiparesis following cerebral infarction affecting right dominant side: Secondary | ICD-10-CM | POA: Diagnosis not present

## 2020-06-02 DIAGNOSIS — R4182 Altered mental status, unspecified: Secondary | ICD-10-CM | POA: Diagnosis not present

## 2020-06-02 DIAGNOSIS — I10 Essential (primary) hypertension: Secondary | ICD-10-CM | POA: Diagnosis not present

## 2020-06-02 DIAGNOSIS — D649 Anemia, unspecified: Secondary | ICD-10-CM | POA: Diagnosis not present

## 2020-06-04 DIAGNOSIS — F418 Other specified anxiety disorders: Secondary | ICD-10-CM | POA: Diagnosis not present

## 2020-06-04 DIAGNOSIS — F0390 Unspecified dementia without behavioral disturbance: Secondary | ICD-10-CM | POA: Diagnosis not present

## 2020-06-04 DIAGNOSIS — F331 Major depressive disorder, recurrent, moderate: Secondary | ICD-10-CM | POA: Diagnosis not present

## 2020-07-01 DIAGNOSIS — F0150 Vascular dementia without behavioral disturbance: Secondary | ICD-10-CM | POA: Diagnosis not present

## 2020-07-01 DIAGNOSIS — I679 Cerebrovascular disease, unspecified: Secondary | ICD-10-CM | POA: Diagnosis not present

## 2020-07-01 DIAGNOSIS — I10 Essential (primary) hypertension: Secondary | ICD-10-CM | POA: Diagnosis not present

## 2020-07-01 DIAGNOSIS — I739 Peripheral vascular disease, unspecified: Secondary | ICD-10-CM | POA: Diagnosis not present

## 2020-07-19 ENCOUNTER — Ambulatory Visit: Payer: Medicare Other | Admitting: Neurology

## 2020-09-10 NOTE — Progress Notes (Signed)
Virtual Visit via Video Note The purpose of this virtual visit is to provide medical care while limiting exposure to the novel coronavirus.    Consent was obtained for video visit:  yes Answered questions that patient had about telehealth interaction:  yes I discussed the limitations, risks, security and privacy concerns of performing an evaluation and management service by telemedicine. I also discussed with the patient that there may be a patient responsible charge related to this service. The patient expressed understanding and agreed to proceed.  Pt location: Home Physician Location: office Name of referring provider:  Javier Glazier, MD I connected with [name] at patients initiation/request on 09/12/2020 at  1:30 PM EDT by video enabled telemedicine application and verified that I am speaking with the correct person using two identifiers. Pt MRN:  [id number] Pt DOB:  Feb 28, 1931 Video Participants:  [name];  daughter  Assessment and Plan:   1.  Major neurocognitive disorder, likely vascular - decline since hospitalization for pneumonia and stroke. 2.  Left corona radiata stroke secondary to small vessel disease - asymptomatic 3.  Questionable atrial fibrillation 4.  Hypertension 5.  Hyperlipidemia 6.  Major depressive disorder with anxiety 7.  Peripheral arterial/vascular disease  1.  Agree with Lexapro and Remeron 2.  Secondary stroke prevention 3.  Encourage socialization 4.  Follow up in 9 months.  History of Present Illness:  [name] is an 87 year old white female with CAD, HTN,dementia,questionable A fib who follows up for TIA and dementia.  She is accompanied by her daughter who supplements history.  CT and MRI/MRA brain from 02/13/2020 personally reviewed.  UPDATE: Current medications: Xarelto, ASA 81mg , Lexapro 10mg  QD, Remeron 15mg  QHS, tramadol, amlodipine, pravastatin 20mg  , losartan.  In September, she was hospitalized for stroke  and pneumonia with delirium.  CT head showed no acute abnormality but follow up MRI of brain demonstrated acute left corona radiata infarct.  No correlating right sided motor weakness.  MRA of head showed high-grade focal stenosis within a mid M2 left MCA branch vessel as well as sites up to moderate/severe stenosis within the P2 right PCA.  Carotid doppler showed no hemodynamically significant stenosis.  2D echo showed EF 60-65% with no cardiac source of embolus.  LDL 86 and Hgb A1c 5.  Started on ASA 81mg  daily.  She resides at [facility] where she has 24 hour care. She is on a soft mechanical diet due to dysphagia.  Zoloft was changed by the PA from Zoloft to Lexapro to help with anxiety.  She lost 13 lbs in 6 months and was placed on Remeron in February.  Since hospitalization, she is more confused.  Difficulty using the TV remote and telephone.  Difficulty following conversations.  Trouble remembering names.  Increased word-finding difficulty.  Delusional and paranoid -Suspicious of both staff and family.  Continues to have unsteady gait.  Right leg weakness is improved but not resolved.    HISTORY: She was admitted to [facility] on 01/17/2019 presenting with sudden onset confusion with right sided weakness and aphasia. In ED, CT head was negative for blled and she was treated with IV tPA. CTA of head and neck demonstrated on large vessel occlusion or significant stenosis. She had few runs of tachycardia in ED but telemetry negative for atrial fibrillation. MRI of brain was negative for acute stroke. Echocardiogram showed EF >65% with severe aortic annular calcification but no source of emboli. LDL was 61  and Hgb A1c was 5.4. Due to confusion, she underwent EEG, which showed generalized theta-delta slowing indicative of moderate diffuse encephalopathy. UA was positive for UTI, which was thought to be the cause of her altered mental status. However, her daughter  didn't think that her symptoms were due to the UTI because she has had prior UTIs in the past and never demonstrated these symptoms. She was discharged on both ASA and Plavix. Her cardiologist wanted to hook her up to a cardiac event monitor but it would require 2 weeks quarantine, so he discontinued Plavix and just went ahead and started her on Xarelto.   Her daughter reports that she has had more difficulty swallowing since the stroke. Her memory has been worse. She does not remember conversations or she is repeating questions. She has trouble with some instruction which may be affected by her hearing loss. She has been more irritable with angry outbursts since the hospitalization. She has been a little more paranoia. She has some days when she is confused. She needs assistance with dressing, using toilet and bathing, some of it is compromised by arthritis. She needs help with transferring (getting in and out of bed and into the bathroom). She feels lonely. She is upset about having to be in assisted living facility.  Prior to hospitalization, she has had memory deficits and irritability, thought to be onset of dementia. She needs help with managing medications. She is anxious. However, it is worse since the stroke. She previously took Aricept and eventually refused to take it.  Past medications:  Sertraline  Past Medical History: Past Medical History:  Diagnosis Date  . Adenomatous colon polyp 2007  . Arthritis   . Bilateral carotid bruits 07/27/2018  . CAD (coronary artery disease)   . Chronic back pain   . Gall stone pancreatitis   . Heart murmur   . High cholesterol   . Hx: UTI (urinary tract infection)    Now infrequent  . Hypertension   . Left knee pain Nov. 18, 2014  . Onychomycosis   . Osteoarthritis   . Osteopenia   . PAD (peripheral artery disease) ([place])   . PVD (peripheral vascular disease) ([place])   . Skin ulcer of left heel ([place])   . Trigger finger,  right   . Vertigo     Medications: Outpatient Encounter Medications as of 09/12/2020  Medication Sig  . acetaminophen (TYLENOL) 325 MG tablet Take 650 mg by mouth in the morning, at noon, and at bedtime. Take with Ultram   . amLODipine (NORVASC) 10 MG tablet Take 1 tablet (10 mg total) by mouth daily.  Marland Kitchen aspirin 81 MG tablet Take 81 mg by mouth daily.  . Carboxymethylcellulose Sodium (THERATEARS) 0.25 % SOLN Apply 1 drop to eye in the morning and at bedtime. Apply 1 drop in each eye twice daily for dry eyes.  . cetirizine (ZYRTEC) 10 MG tablet Take 10 mg by mouth daily.  . Cholecalciferol 50 MCG (2000 UT) TABS Take 2,000 Units by mouth daily.   . famotidine (PEPCID) 20 MG tablet Take 20 mg by mouth daily.  . ondansetron (ZOFRAN) 4 MG tablet Take 4 mg by mouth 2 (two) times daily as needed for nausea or vomiting.  . pravastatin (PRAVACHOL) 20 MG tablet Take 1 tablet (20 mg total) by mouth daily at 6 PM.  . sertraline (ZOLOFT) 50 MG tablet Take 75 mg by mouth daily. Give 1 1/2 tablets to equal 75 mg by mouth every morning  .  traMADol (ULTRAM) 50 MG tablet Take 25 mg by mouth 3 (three) times daily. Give 1/2 tablet by mouth 3 times daily for pain.  . valsartan (DIOVAN) 40 MG tablet Take 40 mg by mouth daily.   No facility-administered encounter medications on file as of 09/12/2020.    Allergies: Allergies  Allergen Reactions  . Hydrochlorothiazide Other (See Comments)    Dropped sodium level  . Penicillins     Unknown  Did it involve swelling of the face/tongue/throat, SOB, or low BP? Unknown Did it involve sudden or severe rash/hives, skin peeling, or any reaction on the inside of your mouth or nose? Unknown Did you need to seek medical attention at a hospital or doctor's office? Unknown When did it last happen?unknown If all above answers are "NO", may proceed with cephalosporin use.   . Sulfa Antibiotics     unknown    Family History: Family History  Problem Relation Age  of Onset  . Cancer Mother        ovarian  . Pneumonia Father   . Heart attack Brother   . Heart disease Brother        Heart Disease before age 90  . Healthy Child     Observations/Objective:   Height 5\' 4"  (1.626 m), weight 122 lb (55.3 kg). No acute distress.  Alert and oriented.  Speech fluent and not dysarthric.  Language intact.    Follow Up Instructions:    -I discussed the assessment and treatment plan with the patient. The patient was provided an opportunity to ask questions and all were answered. The patient agreed with the plan and demonstrated an understanding of the instructions.   The patient was advised to call back or seek an in-person evaluation if the symptoms worsen or if the condition fails to improve as anticipated.   Dudley Major, DO

## 2020-09-12 ENCOUNTER — Telehealth (INDEPENDENT_AMBULATORY_CARE_PROVIDER_SITE_OTHER): Payer: Medicare Other | Admitting: Neurology

## 2020-09-12 ENCOUNTER — Encounter: Payer: Self-pay | Admitting: Neurology

## 2020-09-12 ENCOUNTER — Other Ambulatory Visit: Payer: Self-pay

## 2020-09-12 VITALS — Ht 64.0 in | Wt 122.0 lb

## 2020-09-12 DIAGNOSIS — I10 Essential (primary) hypertension: Secondary | ICD-10-CM

## 2020-09-12 DIAGNOSIS — F32A Depression, unspecified: Secondary | ICD-10-CM

## 2020-09-12 DIAGNOSIS — F0390 Unspecified dementia without behavioral disturbance: Secondary | ICD-10-CM

## 2020-09-12 DIAGNOSIS — F419 Anxiety disorder, unspecified: Secondary | ICD-10-CM | POA: Diagnosis not present

## 2020-09-12 DIAGNOSIS — I739 Peripheral vascular disease, unspecified: Secondary | ICD-10-CM | POA: Diagnosis not present

## 2020-09-12 DIAGNOSIS — G459 Transient cerebral ischemic attack, unspecified: Secondary | ICD-10-CM

## 2020-09-17 ENCOUNTER — Ambulatory Visit: Payer: Medicare Other | Admitting: Neurology

## 2021-01-03 ENCOUNTER — Ambulatory Visit: Payer: Medicare Other | Admitting: Neurology

## 2021-06-16 ENCOUNTER — Telehealth: Payer: Medicare Other | Admitting: Neurology

## 2021-07-02 ENCOUNTER — Non-Acute Institutional Stay: Payer: Medicare Other | Admitting: Internal Medicine

## 2021-07-02 ENCOUNTER — Other Ambulatory Visit: Payer: Self-pay

## 2021-07-02 DIAGNOSIS — Z515 Encounter for palliative care: Secondary | ICD-10-CM

## 2021-07-02 DIAGNOSIS — I679 Cerebrovascular disease, unspecified: Secondary | ICD-10-CM

## 2021-07-02 DIAGNOSIS — G8191 Hemiplegia, unspecified affecting right dominant side: Secondary | ICD-10-CM

## 2021-07-02 DIAGNOSIS — I69391 Dysphagia following cerebral infarction: Secondary | ICD-10-CM

## 2021-07-02 NOTE — Progress Notes (Signed)
[facility] Initial Consult Note   Telephone: [phone]    Fax: [phone]     Date of encounter: 07/02/21 PATIENT NAME: [name]     DOB: 05-14-1931 Location of Service: [place]   PRIMARY CARE PROVIDER: Dr. Merilynn Finland    REFERRING PROVIDER:  Dr. Merilynn Finland RESPONSIBLE PARTY: [name], daughter - [phone]  BILLABLE ICD-10:   Altered mental status: R41.82 Cerebral infarction with right side hemiparesis: I69.351 Dysphagia: I69.921 Palliative Care Consult: Z51.5   PPS:  30%  HOSPICE ELIGIBILITY/DIAGNOSIS: Cerebrovascular disease with related vascular dementia, CVA, right sided hemiparesis, dysphagia, protein calorie malnutrition, depression/anxiety, CAD, HLD, and hypertensive chronic kidney disease stage 3. I met face to face with patient and family. Palliative Care was asked to follow this patient by consultation request of Dr. Antony Salmon to address advance care planning, goals of care, symptom management, and evaluate hospice eligibility. This is the initial visit.     ASSESSMENT AND PLAN / RECOMMENDATIONS:   Advance Care Planning/Goals of Care: Goals include to maximize quality of life and symptom management. Our advance care planning conversation included a discussion about:  The value and importance of advance care planning    Experiences with loved ones who have been seriously ill or have died    Exploration of personal, cultural or spiritual beliefs that might influence medical decisions    Exploration of goals of care in the event of a sudden injury or illness    Identification and preparation of a healthcare agent    Review and updating or creation of advance directive documents.   Decision not to resuscitate or to de-escalate disease focused treatments due to poor prognosis.      CODE STATUS: DNR in place on facility chart. MOST form reviewed with daughter, [name], via telephone and  choices are as follows - DNR, no further hospitalization with comfort measures only, no ABX, no IVF, no feeding tube.    Symptom Management/Plan:   Altered mental status: Initially treated for UTI or possible infection. Will complete current course of Levaquin then no further plans to use ABX. Likely recurrent CVA and natural progression of cerebrovascular disease. Family has chosen not to go to hospital for further work up. Patient is taking Lexapro 80m for depression/anxiety. May need additional interventions if behaviors increase over time. Cerebral infarction with right sided hemiparesis: facility staff will continue to manage personal care needs and provide interventions for safe mobility. Currently getting OOB to wheelchair and recliner daily. OT is consulting. Dysphagia: patient is on pureed diet with nectar thick liquids. PO intake has decreased significantly to 25% or less of meals and patient has experienced at least a 4 lb weight loss in the last month. Facility staff will continue to feed patient with aspiration precautions. ST is consulting.  Hospice referral to meet additional safety, symptom management, and comfort goals.  Discussed with Dr. PAntony Salmon facility medical director, who agrees to service as hospice attending of record.      Thank you for the opportunity [facility]  Please call our office at [phone] we can be of additional assistance.     This visit was coded based on medical decision making (MDM).  37 minutes spent on ACP as above.    COVID-19 PATIENT SCREENING TOOL    Asked and negative response unless otherwise noted:    Have you had symptoms of covid, tested positive or been  in contact with someone with symptoms/positive test in the past 5-10 days? no    Chief Complaint: initial palliative care consult to establish goals of care, provide symptom management and advanced care planning. Evaluate for hospice  eligibility.   HISTORY OF PRESENT ILLNESS:  This is an 88 y/o female with a past medical history significant for CVA, vascular dementia, right sided hemiparesis, dysphagia, protein calorie malnutrition, depression/anxiety, CAD, HLD, and hypertensive chronic kidney disease stage 3 seen for palliative care consult for goals of care, symptom management, advanced directives, and evaluation for hospice eligibility at the request of D. Merilynn Finland. [name] developed acute altered mental status on 06/25/21 and was believed to have a UTI. Patient was started on Macrobid, but urine culture returned negative. PCP started patient on seven-day course of Levaquin to cover alternative infection source due to leukocytosis. Patient mental status has continued to decline, and she subsequently developed increased right-sided hemiparesis. Patient is now believed to have suffered another CVA. Family and facility staff confirm that patient is sleeping more continuously during the day and nighttime. She is now unable to recognize her children and speech has become more difficult to understand. Family has reported episodes of acute anxiety/agitation when they visit and seems desperate to go home. Her PO intake has decreased in the last week, taking bites to 25% of pureed meals with nectar thick liquids. Weight has decreased 4 lbs in the last month from 126 in January to 122 in February but patient has not been weighed since recent decline began.   History obtained from review of EMR, discussion with primary team, and interview with family, facility staff/caregiver and/or patient.     I reviewed available labs, medications, imaging, studies and related documents from the EMR.  Records reviewed and summarized above.     ROS:   General: NAD    EYES: denies vision changes    ENMT: dysphagia present    Cardiovascular: denies chest pain, denies dyspnea   Pulmonary: denies cough, denies increased SOB    Abdomen: facility  staff endorses waning appetite, denies constipation, incontinence of bowel    GU: denies dysuria or hematuria, incontinence of urine    MSK:  facility staff endorsees increased right upper extremity weakness, no falls reported    Skin: facility staff denies rashes or wounds  Neurological: denies pain, facility staff endorsees increased somnolence during the day with ongoing nighttime sleep.   Psych: Family endorses intermittent anxiety and agitation.  Heme/lymph/immuno: facilty staff denies bruises, abnormal bleeding      Physical Exam:    Current and past weights: 122 lbs (2/23) 126 lbs (1/23)  Constitutional: NAD    General: frail appearing, with temporal wasting   EYES: anicteric sclera, lids intact, no discharge     ENMT: hard of hearing with bilateral hearing aids in place, oral mucous membranes moist, dentition intact    CV: S1S2, RRR, no LE edema    Pulmonary: LCTA but diminished in BML and BLL, no increased work of breathing, no cough, room air    Abdomen: intake bites to 25%, normo-active BS + 4 quadrants, soft and non-tender, no ascites    GU: deferred    MSK: BUE sarcopenia, right hemiparesis, non-ambulatory    Skin: thin, fragile skin, warm and dry, no rashes or wounds on visible skin    Neuro:  mild right side facial droop and mild aphasia, dysphagia    Psych: intermittent anxiety  Hem/lymph/immuno: no widespread bruising  CURRENT PROBLEM LIST reviewed as in hpi    [place] reviewed as in hpi    SOCIAL HX:  Has five children, three live in [place] and two live out of state. Never smoked. No ETOH use.   FAMILY HX: Mother deceased, age 70 with cancer, Father deceased, age unknown with PNA, Brother deceased, age 33's with heart attack  ALLERGIES: Donepezil HCL, Hydrochlorothiazide, Penicillin, Sulfonamide ABX  CURRENT MEDICATIONS:  Levaquin 2104m tablet (Levofloxacin) - 250 mg by mouth once daily for increased WBC's. (last dose  07/07/21) Bacid 1 billion cell - 250 mg tablet (Acidophilus-L.b.Bb-S.thermohl) - 1 capsule by mouth twice daily for ten days for GERD without esophagitis.  (last dose 07/07/21) Exelon Patch 9.539m24 hr transdermal patch (Rivastigmine) - 1 topical patch daily for vascular dementia. Omeprazole 2019mapsule, delayed release (generic) - 1 capsule by mouth every morning. Tramadol 24m63mblet (generic) - give half tablet (25mg70mblet by mouth at midday daily for pain Tramadol 24mg 77met (generic) - give 1 tablet twice daily by mouth with Tylenol 325mg 220mlets (624mg) f52main. Tylenol 326mg tab36m(Acetaminophen) - give 2 tablets (624mg) by 51mh every night at bedtime for pain. Tramadol 24mg table33meneric) give half tablet (25mg) by mo74mdaily as needed for pain Tylenol 325mg tablet 39mtaminophen) - give 2 tablets by mouth twice daily with Tramadol 24mg for pain42mtanoprost 0.005% eye drops (generic) - instill 1 drop to each eye every night at bedtime for ocular hypertension. Remeron 15mg tablet (M40mzapine) - give 1 tablet by mouth every night at bedtime. Aspirin 81mg chewable t53mt (generic) - 1 tablet by mouth once daily. Lexapro 10mg tablet (Esc22mopram) - give 1 tablet by mouth once daily. Amlodipine 10mg tablet (gene3m - 1 tab by mouth once daily Zyrtec 10mg tablet (Cetir2me) - give 1 tablet by mouth once daily. Vitamin D3 2,000 unit tablet (Cholecalciferol) give 1 tablet by mouth once daily. Pravachol 20mg tablet (Pravas63mn) give 1 tablet by mouth once daily. TheraTears 0.25% eye drops (Carboxymethylcellulose sodium) - instill 1 drop in each eye twice daily for dry eyes. Valsartan 40mg tablet (generic68mgive 1 tablet by mouth once daily Compound topical cream for arthritis - Step 1: apply 1 gram of baclofen 5%, diclofenac 3%, gabapentin 6%, to the affected area 4 times daily as needed. Step 2: apply 2-3 grams of lidocaine-prilocaine cream to the affected area (after step 1  application) twice daily. Nizoral 2% shampoo (Ketoconazole) apply to scalp and rinse weekly as needed for dry/itchy scalp. Biotene mouthwash daily for dry mouth. Calmoseptin - apply to sacral/buttocks areas every shift.     See Assessment/Plan in the beginning of the note.    Whitleigh Garramone L. Jayshaun Phillips, DO, CMD    [facility]    Phone:  [phone]    [phone]

## 2021-07-03 ENCOUNTER — Encounter: Payer: Self-pay | Admitting: Internal Medicine

## 2021-07-16 DEATH — deceased

## 2021-10-30 ENCOUNTER — Telehealth: Payer: Medicare Other | Admitting: Neurology
